# Patient Record
Sex: Male | Born: 1935 | Race: White | Hispanic: No | Marital: Married | Smoking: Former smoker
Health system: Southern US, Community
[De-identification: ages and names within clinical notes are randomized; demographics above are authoritative.]

## PROBLEM LIST (undated history)

## (undated) DIAGNOSIS — I2510 Atherosclerotic heart disease of native coronary artery without angina pectoris: Secondary | ICD-10-CM

## (undated) DIAGNOSIS — I4891 Unspecified atrial fibrillation: Secondary | ICD-10-CM

## (undated) DIAGNOSIS — I472 Ventricular tachycardia, unspecified: Secondary | ICD-10-CM

## (undated) DIAGNOSIS — I10 Essential (primary) hypertension: Secondary | ICD-10-CM

## (undated) DIAGNOSIS — I9789 Other postprocedural complications and disorders of the circulatory system, not elsewhere classified: Secondary | ICD-10-CM

## (undated) DIAGNOSIS — M7022 Olecranon bursitis, left elbow: Secondary | ICD-10-CM

## (undated) DIAGNOSIS — I739 Peripheral vascular disease, unspecified: Secondary | ICD-10-CM

## (undated) DIAGNOSIS — D126 Benign neoplasm of colon, unspecified: Secondary | ICD-10-CM

## (undated) DIAGNOSIS — M1990 Unspecified osteoarthritis, unspecified site: Secondary | ICD-10-CM

## (undated) DIAGNOSIS — R0602 Shortness of breath: Secondary | ICD-10-CM

## (undated) DIAGNOSIS — K219 Gastro-esophageal reflux disease without esophagitis: Secondary | ICD-10-CM

## (undated) DIAGNOSIS — M65342 Trigger finger, left ring finger: Secondary | ICD-10-CM

## (undated) DIAGNOSIS — R001 Bradycardia, unspecified: Secondary | ICD-10-CM

## (undated) DIAGNOSIS — Z952 Presence of prosthetic heart valve: Secondary | ICD-10-CM

## (undated) DIAGNOSIS — K5790 Diverticulosis of intestine, part unspecified, without perforation or abscess without bleeding: Secondary | ICD-10-CM

## (undated) DIAGNOSIS — Z87891 Personal history of nicotine dependence: Secondary | ICD-10-CM

## (undated) DIAGNOSIS — I350 Nonrheumatic aortic (valve) stenosis: Secondary | ICD-10-CM

## (undated) DIAGNOSIS — Z9861 Coronary angioplasty status: Secondary | ICD-10-CM

## (undated) DIAGNOSIS — E785 Hyperlipidemia, unspecified: Secondary | ICD-10-CM

## (undated) HISTORY — DX: Personal history of nicotine dependence: Z87.891

## (undated) HISTORY — PX: TONSILLECTOMY: SUR1361

## (undated) HISTORY — DX: Hyperlipidemia, unspecified: E78.5

## (undated) HISTORY — DX: Ventricular tachycardia: I47.2

## (undated) HISTORY — DX: Ventricular tachycardia, unspecified: I47.20

## (undated) HISTORY — DX: Benign neoplasm of colon, unspecified: D12.6

## (undated) HISTORY — PX: ROTATOR CUFF REPAIR: SHX139

## (undated) HISTORY — DX: Essential (primary) hypertension: I10

## (undated) HISTORY — DX: Unspecified atrial fibrillation: I97.89

## (undated) HISTORY — DX: Diverticulosis of intestine, part unspecified, without perforation or abscess without bleeding: K57.90

## (undated) HISTORY — DX: Unspecified atrial fibrillation: I48.91

## (undated) HISTORY — PX: OTHER SURGICAL HISTORY: SHX169

## (undated) HISTORY — DX: Gastro-esophageal reflux disease without esophagitis: K21.9

---

## 1991-06-12 DIAGNOSIS — I2510 Atherosclerotic heart disease of native coronary artery without angina pectoris: Secondary | ICD-10-CM

## 1991-06-12 DIAGNOSIS — Z9861 Coronary angioplasty status: Secondary | ICD-10-CM

## 1991-06-12 HISTORY — DX: Atherosclerotic heart disease of native coronary artery without angina pectoris: I25.10

## 1991-06-12 HISTORY — PX: CORONARY ANGIOPLASTY: SHX604

## 1991-06-12 HISTORY — DX: Atherosclerotic heart disease of native coronary artery without angina pectoris: Z98.61

## 1994-06-11 HISTORY — PX: OTHER SURGICAL HISTORY: SHX169

## 1994-06-11 HISTORY — PX: ILIAC ARTERY STENT: SHX1786

## 1994-07-19 HISTORY — PX: OTHER SURGICAL HISTORY: SHX169

## 1995-01-17 HISTORY — PX: OTHER SURGICAL HISTORY: SHX169

## 2009-07-11 ENCOUNTER — Encounter (INDEPENDENT_AMBULATORY_CARE_PROVIDER_SITE_OTHER): Payer: Self-pay

## 2009-07-12 ENCOUNTER — Ambulatory Visit: Payer: Self-pay | Admitting: Gastroenterology

## 2009-07-27 ENCOUNTER — Ambulatory Visit: Payer: Self-pay | Admitting: Gastroenterology

## 2009-08-01 ENCOUNTER — Encounter: Payer: Self-pay | Admitting: Gastroenterology

## 2009-10-20 ENCOUNTER — Encounter: Payer: Self-pay | Admitting: Internal Medicine

## 2009-11-10 ENCOUNTER — Inpatient Hospital Stay (HOSPITAL_COMMUNITY): Admission: EM | Admit: 2009-11-10 | Discharge: 2009-11-12 | Payer: Self-pay | Admitting: Emergency Medicine

## 2009-11-10 HISTORY — PX: CARDIAC CATHETERIZATION: SHX172

## 2009-11-11 ENCOUNTER — Encounter (INDEPENDENT_AMBULATORY_CARE_PROVIDER_SITE_OTHER): Payer: Self-pay | Admitting: Cardiology

## 2009-11-14 ENCOUNTER — Ambulatory Visit (HOSPITAL_COMMUNITY): Admission: RE | Admit: 2009-11-14 | Discharge: 2009-11-14 | Payer: Self-pay | Admitting: Cardiovascular Disease

## 2010-07-13 NOTE — Procedures (Signed)
Summary: Colonoscopy  Patient: Ralph Drake Note: All result statuses are Final unless otherwise noted.  Tests: (1) Colonoscopy (COL)   COL Colonoscopy           DONE     [facility]     [address]     [place], [place]  [address]           COLONOSCOPY PROCEDURE REPORT           PATIENT:  Ralph Drake, Ralph Drake  MR#:  [id number]     BIRTHDATE:  11/14/1934, 74 yrs. old  GENDER:  male           ENDOSCOPIST:  Vania Rea. Jarold Motto, MD, [facility]     Referred by:  Tracey Harries, M.D.           PROCEDURE DATE:  07/27/2009     PROCEDURE:  Colonoscopy with snare polypectomy     ASA CLASS:  Class II     INDICATIONS:  Routine Risk Screening           MEDICATIONS:   Fentanyl 25 mcg IV, Versed 5 mg IV           DESCRIPTION OF PROCEDURE:   After the risks benefits and     alternatives of the procedure were thoroughly explained, informed     consent was obtained.  Digital rectal exam was performed and     revealed no abnormalities.   The LB CF-[id number] endoscope     was introduced through the anus and advanced to the cecum, which     was identified by both the appendix and ileocecal valve, without     limitations.  The quality of the prep was excellent, using     MoviPrep.  The instrument was then slowly withdrawn as the colon     was fully examined.     <<PROCEDUREIMAGES>>           FINDINGS:  A diminutive polyp was found at the hepatic flexure.     Polyp was snared without cautery. Retrieval was successful. snare     polyp  A sessile polyp was found in the descending colon. 1cm     vascular polyp removed Polyp was snared, then cauterized with     monopolar cautery. Retrieval was successful. snare polyp  This was     otherwise a normal examination of the colon.   Retroflexed views     in the rectum revealed no abnormalities.    The scope was then     withdrawn from the patient and the procedure completed.           COMPLICATIONS:  None           ENDOSCOPIC IMPRESSION:     1)  Diminutive polyp at the hepatic flexure     2) Sessile polyp in the descending colon     3) Otherwise normal examination     RECOMMENDATIONS:     1) Given your significant family history of colon cancer, you     should have a repeat colonoscopy in 5 years           REPEAT EXAM:  No           ______________________________     Vania Rea. Jarold Motto, MD, [facility]           CC:           n.     eSIGNED:   Vania Rea. Cordarro Spinnato at 07/27/2009 10:18  AM           [place], [place], [phone]  Note: An exclamation mark (!) indicates a result that was not dispersed into the flowsheet. Document Creation Date: 07/27/2009 10:19 AM _______________________________________________________________________  (1) Order result status: Final Collection or observation date-time: 07/27/2009 10:08 Requested date-time:  Receipt date-time:  Reported date-time:  Referring Physician:   Ordering Physician: Sheryn Bison [id number]) Specimen Source:  Source: Launa Grill Order Number: [id number] Lab site:   Appended Document: Colonoscopy     Procedures Next Due Date:    Colonoscopy: 08/2014

## 2010-07-13 NOTE — Letter (Signed)
Summary: [facility] Instructions  [facility]  [address] [place], [place] [address]   Phone: [phone]  Fax: [phone]       Ralph Drake    08/29/1934    MRN: [id number]        Procedure Day Dorna Bloom:  Wednesday 07/27/2009     Arrival Time: 8:30 am      Procedure Time: 9:30 am     Location of Procedure:                    _x _  [facility] (4th Floor)   PREPARATION FOR COLONOSCOPY WITH MOVIPREP   Starting 5 days prior to your procedure Friday 2/11 do not eat nuts, seeds, popcorn, corn, beans, peas,  salads, or any raw vegetables.  Do not take any fiber supplements (e.g. Metamucil, Citrucel, and Benefiber).  THE DAY BEFORE YOUR PROCEDURE         DATE: Tuesday 2/15  1.  Drink clear liquids the entire day-NO SOLID FOOD  2.  Do not drink anything colored red or purple.  Avoid juices with pulp.  No orange juice.  3.  Drink at least 64 oz. (8 glasses) of fluid/clear liquids during the day to prevent dehydration and help the prep work efficiently.  CLEAR LIQUIDS INCLUDE: Water Jello Ice Popsicles Tea (sugar ok, no milk/cream) Powdered fruit flavored drinks Coffee (sugar ok, no milk/cream) Gatorade Juice: apple, white grape, white cranberry  Lemonade Clear bullion, consomm, broth Carbonated beverages (any kind) Strained chicken noodle soup Hard Candy                             4.  In the morning, mix first dose of MoviPrep solution:    Empty 1 Pouch A and 1 Pouch B into the disposable container    Add lukewarm drinking water to the top line of the container. Mix to dissolve    Refrigerate (mixed solution should be used within 24 hrs)  5.  Begin drinking the prep at 5:00 p.m. The MoviPrep container is divided by 4 marks.   Every 15 minutes drink the solution down to the next mark (approximately 8 oz) until the full liter is complete.   6.  Follow completed prep with 16 oz of clear liquid of your choice (Nothing red or purple).   Continue to drink clear liquids until bedtime.  7.  Before going to bed, mix second dose of MoviPrep solution:    Empty 1 Pouch A and 1 Pouch B into the disposable container    Add lukewarm drinking water to the top line of the container. Mix to dissolve    Refrigerate  THE DAY OF YOUR PROCEDURE      DATE: Wednesday 2/16  Beginning at 4:30 am (5 hours before procedure):         1. Every 15 minutes, drink the solution down to the next mark (approx 8 oz) until the full liter is complete.  2. Follow completed prep with 16 oz. of clear liquid of your choice.    3. You may drink clear liquids until 7:30 am (2 HOURS BEFORE PROCEDURE).   MEDICATION INSTRUCTIONS  Unless otherwise instructed, you should take regular prescription medications with a small sip of water   as early as possible the morning of your procedure         OTHER INSTRUCTIONS  You will need a responsible adult at least  74 years of age to accompany you and drive you home.   This person must remain in the waiting room during your procedure.  Wear loose fitting clothing that is easily removed.  Leave jewelry and other valuables at home.  However, you may wish to bring a book to read or  an iPod/MP3 player to listen to music as you wait for your procedure to start.  Remove all body piercing jewelry and leave at home.  Total time from sign-in until discharge is approximately 2-3 hours.  You should go home directly after your procedure and rest.  You can resume normal activities the  day after your procedure.  The day of your procedure you should not:   Drive   Make legal decisions   Operate machinery   Drink alcohol   Return to work  You will receive specific instructions about eating, activities and medications before you leave.    The above instructions have been reviewed and explained to me by   Ulis Rias RN  July 12, 2009 1:38 PM     I fully understand and can verbalize these  instructions _____________________________ Date _________

## 2010-07-13 NOTE — Letter (Signed)
Summary: Patient Notice- Polyp Results  [facility]  [address] [place], [place] [address]   Phone: [phone]  Fax: [phone]        August 01, 2009 MRN: [id number]    Ralph Drake [address] [place], [place]  [address]    Dear Ralph Drake,  I am pleased to inform you that the colon polyp(s) removed during your recent colonoscopy was (were) found to be benign (no cancer detected) upon pathologic examination.  I recommend you have a repeat colonoscopy examination in 5_ years to look for recurrent polyps, as having colon polyps increases your risk for having recurrent polyps or even colon cancer in the future.  Should you develop new or worsening symptoms of abdominal pain, bowel habit changes or bleeding from the rectum or bowels, please schedule an evaluation with either your primary care physician or with me.  Additional information/recommendations:  _x_ No further action with gastroenterology is needed at this time. Please      follow-up with your primary care physician for your other healthcare      needs.  __ Please call [phone] to schedule a return visit to review your      situation.  __ Please keep your follow-up visit as already scheduled.  __ Continue treatment plan as outlined the day of your exam.  Please call us if you are having persistent problems or have questions about your condition that have not been fully answered at this time.  Sincerely,  Mardella Layman MD [facility]  This letter has been electronically signed by your physician.  Appended Document: Patient Notice- Polyp Results Letter mailed 2.22.11.

## 2010-07-13 NOTE — Miscellaneous (Signed)
Summary: Lec previsit  Clinical Lists Changes  Medications: Added new medication of MOVIPREP 100 GM  SOLR (PEG-KCL-NACL-NASULF-NA ASC-C) As per prep instructions. - Signed Rx of MOVIPREP 100 GM  SOLR (PEG-KCL-NACL-NASULF-NA ASC-C) As per prep instructions.;  #1 x 0;  Signed;  Entered by: Ulis Rias RN;  Authorized by: Mardella Layman MD [facility];  Method used: Electronically to [facility] [address], [place], [place], [place]  [address], Ph: [phone], Fax: [phone] Observations: Added new observation of ALLERGY REV: Done (07/12/2009 13:13)    Prescriptions: MOVIPREP 100 GM  SOLR (PEG-KCL-NACL-NASULF-NA ASC-C) As per prep instructions.  #1 x 0   Entered by:   Ulis Rias RN   Authorized by:   Mardella Layman MD [facility]   Signed by:   Ulis Rias RN on 07/12/2009   Method used:   Electronically to        [facility] (retail)       [address]       [place], [place]  [address]       Ph: [phone]       Fax: [phone]   RxID:   [id number]

## 2010-08-28 LAB — CBC
HCT: 41.3 % (ref 39.0–52.0)
Hemoglobin: 14.2 g/dL (ref 13.0–17.0)
Platelets: 197 10*3/uL (ref 150–400)
RBC: 4.23 MIL/uL (ref 4.22–5.81)

## 2010-08-28 LAB — DIFFERENTIAL
Basophils Absolute: 0 10*3/uL (ref 0.0–0.1)
Eosinophils Absolute: 0.1 10*3/uL (ref 0.0–0.7)
Eosinophils Relative: 1 % (ref 0–5)
Lymphocytes Relative: 23 % (ref 12–46)
Lymphs Abs: 1.8 10*3/uL (ref 0.7–4.0)
Monocytes Relative: 5 % (ref 3–12)
Neutro Abs: 5.6 10*3/uL (ref 1.7–7.7)

## 2010-08-28 LAB — CARDIAC PANEL(CRET KIN+CKTOT+MB+TROPI)
CK, MB: 2.3 ng/mL (ref 0.3–4.0)
CK, MB: 2.3 ng/mL (ref 0.3–4.0)
CK, MB: 2.7 ng/mL (ref 0.3–4.0)
CK, MB: 3.3 ng/mL (ref 0.3–4.0)
Relative Index: INVALID (ref 0.0–2.5)
Total CK: 92 U/L (ref 7–232)
Troponin I: 0.12 ng/mL — ABNORMAL HIGH (ref 0.00–0.06)
Troponin I: 0.13 ng/mL — ABNORMAL HIGH (ref 0.00–0.06)
Troponin I: 0.14 ng/mL — ABNORMAL HIGH (ref 0.00–0.06)

## 2010-08-28 LAB — HEMOGLOBIN A1C
Hgb A1c MFr Bld: 6.1 % — ABNORMAL HIGH (ref <5.7)
Mean Plasma Glucose: 128 mg/dL — ABNORMAL HIGH (ref <117)

## 2010-08-28 LAB — COMPREHENSIVE METABOLIC PANEL
ALT: 21 U/L (ref 0–53)
AST: 28 U/L (ref 0–37)
Alkaline Phosphatase: 66 U/L (ref 39–117)
CO2: 25 mEq/L (ref 19–32)
Calcium: 9.4 mg/dL (ref 8.4–10.5)
Creatinine, Ser: 1.08 mg/dL (ref 0.4–1.5)
GFR calc non Af Amer: >60 mL/min (ref >60)
Glucose, Bld: 126 mg/dL — ABNORMAL HIGH (ref 70–99)
Potassium: 4.2 mEq/L (ref 3.5–5.1)
Total Protein: 6.2 g/dL (ref 6.0–8.3)

## 2010-08-28 LAB — POCT I-STAT 3, ART BLOOD GAS (G3+)
Bicarbonate: 27.2 mEq/L — ABNORMAL HIGH (ref 20.0–24.0)
TCO2: 29 mmol/L (ref 0–100)
pH, Arterial: 7.329 — ABNORMAL LOW (ref 7.350–7.450)
pO2, Arterial: 64 mmHg — ABNORMAL LOW (ref 80.0–100.0)

## 2010-08-28 LAB — POCT I-STAT 3, VENOUS BLOOD GAS (G3P V)
Bicarbonate: 27.1 mEq/L — ABNORMAL HIGH (ref 20.0–24.0)
TCO2: 29 mmol/L (ref 0–100)

## 2010-08-28 LAB — URINALYSIS, ROUTINE W REFLEX MICROSCOPIC
Glucose, UA: NEGATIVE mg/dL
Hgb urine dipstick: NEGATIVE
Nitrite: NEGATIVE

## 2010-08-28 LAB — POCT I-STAT, CHEM 8
BUN: 17 mg/dL (ref 6–23)
Calcium, Ion: 1.23 mmol/L (ref 1.12–1.32)
Chloride: 105 mEq/L (ref 96–112)
Glucose, Bld: 123 mg/dL — ABNORMAL HIGH (ref 70–99)

## 2010-08-28 LAB — BASIC METABOLIC PANEL
BUN: 18 mg/dL (ref 6–23)
Creatinine, Ser: 1.14 mg/dL (ref 0.4–1.5)
GFR calc Af Amer: >60 mL/min (ref >60)
GFR calc non Af Amer: >60 mL/min (ref >60)
Potassium: 4.2 mEq/L (ref 3.5–5.1)

## 2010-08-28 LAB — TROPONIN I
Troponin I: 0.03 ng/mL (ref 0.00–0.06)
Troponin I: 0.04 ng/mL (ref 0.00–0.06)

## 2010-08-28 LAB — MRSA PCR SCREENING: MRSA by PCR: NEGATIVE

## 2010-08-28 LAB — PROTIME-INR: Prothrombin Time: 12 seconds (ref 11.6–15.2)

## 2011-07-03 ENCOUNTER — Encounter: Payer: Self-pay | Admitting: Physician Assistant

## 2011-07-03 ENCOUNTER — Other Ambulatory Visit: Payer: Self-pay | Admitting: Physician Assistant

## 2011-07-03 NOTE — H&P (Signed)
Ralph Drake is an 76 y.o. male.   Chief Complaint: left elbow bursitis and left 4th finger trigger HPI: Ralph Drake is a 76 year old seen for evaluation of recurrent left elbow olecranon bursitis. We last saw him and aspirated this on 11/13/10 but it slowly came back in 2-3 weeks and is now bothering him persistently. He also has painful left 4th trigger finger. He's had intermittent trigger fingers in the 3rd and 5th but the 4th trigger finger is the one that bothers him the most.  Past Medical History  Diagnosis Date  . Hypertension   . GERD (gastroesophageal reflux disease)   . Coronary artery disease     Past Surgical History  Procedure Date  . Coronary angioplasty     Family History  Problem Relation Age of Onset  . Hypertension Other    Social History:  reports that he has been smoking.  He does not have any smokeless tobacco history on file. He reports that he does not drink alcohol. His drug history not on file.  Allergies: No Known Allergies  Medications: Current outpatient prescriptions:amLODipine (NORVASC) 10 MG tablet, Take 10 mg by mouth daily., Disp: , Rfl: ;  aspirin 325 MG EC tablet, Take 325 mg by mouth daily., Disp: , Rfl: ;  atorvastatin (LIPITOR) 40 MG tablet, Take 40 mg by mouth daily., Disp: , Rfl: ;  cilostazol (PLETAL) 50 MG tablet, Take 50 mg by mouth 2 (two) times daily., Disp: , Rfl: ;  ezetimibe (ZETIA) 10 MG tablet, Take 10 mg by mouth daily., Disp: , Rfl:  fish oil-omega-3 fatty acids 1000 MG capsule, Take 1 g by mouth daily., Disp: , Rfl: ;  lisinopril (PRINIVIL,ZESTRIL) 40 MG tablet, Take 40 mg by mouth daily., Disp: , Rfl: ;  multivitamin (THERAGRAN) per tablet, Take 1 tablet by mouth daily., Disp: , Rfl: ;  nebivolol (BYSTOLIC) 2.5 MG tablet, Take 2.5 mg by mouth every other day. 1/2 a tablet every other day, Disp: , Rfl:  omeprazole (PRILOSEC) 20 MG capsule, Take 20 mg by mouth daily., Disp: , Rfl: ;  vitamin E 1000 UNIT capsule, Take 1,000 Units by  mouth daily., Disp: , Rfl:   No current outpatient prescriptions on file as of 07/03/2011.   No current facility-administered medications on file as of 07/03/2011.    No results found for this or any previous visit (from the past 48 hour(s)). No results found.  Review of Systems  Constitutional: Negative.   HENT: Negative.   Eyes: Negative.   Respiratory: Negative.   Cardiovascular: Negative.  Negative for chest pain.  Gastrointestinal: Negative.   Genitourinary: Negative.   Musculoskeletal:       Left elbow swelling.  Left 4 th finger triggering  Skin: Negative.   Neurological: Negative.   Endo/Heme/Allergies: Negative.   Psychiatric/Behavioral: Negative.     There were no vitals taken for this visit. Physical Exam  Constitutional: He is oriented to person, place, and time. He appears well-developed and well-nourished.  HENT:  Head: Normocephalic and atraumatic.  Mouth/Throat: Oropharynx is clear and moist.  Eyes: Conjunctivae and EOM are normal. Pupils are equal, round, and reactive to light.  Neck: Neck supple.  Cardiovascular: Normal rate.   Respiratory: Effort normal.  GI: Soft.  Genitourinary:       Not pertinent to current symptomatology therefore not examined.  Musculoskeletal:       Examination of his left elbow reveals prominence to the olecranon bursa with no wounds, no streaking no   cellulitis full range of motion. Exam of the right elbow reveals no pain swelling or deformity. Exam of the left 4th finger reveals positive triggering with pain no triggering of the other fingers.  Neurological: He is alert and oriented to person, place, and time.  Skin: Skin is warm and dry.  Psychiatric: He has a normal mood and affect. His behavior is normal. Judgment normal.     Assessment Left elbow olecranon bursitis Left 4th finger trigger Coronary artery disease GERD Hypertension  Plan I talk to him in detail about this. I think with recurrent pain and swelling we  should proceed with left elbow olecranon bursectomy as well as left 4th trigger finger release. Discussed risks benefits and possible complications of the surgery in detail and he understands this completely.    Ralph Drake 07/03/2011, 1:49 PM    

## 2011-07-18 NOTE — Progress Notes (Signed)
Dr Gelene Mink wanted dr Alanda Amass aware he was having surgery 07/23/11-was he ok with him having out pt surg here and going home same day-pt has severe aortic stenosis and pvd. Dr Mancel Parsons office called 2/4-pt has ov to see him 07/19/11

## 2011-07-19 ENCOUNTER — Encounter (HOSPITAL_BASED_OUTPATIENT_CLINIC_OR_DEPARTMENT_OTHER): Payer: Self-pay | Admitting: *Deleted

## 2011-07-19 ENCOUNTER — Other Ambulatory Visit: Payer: Self-pay | Admitting: Physician Assistant

## 2011-07-19 ENCOUNTER — Encounter (HOSPITAL_BASED_OUTPATIENT_CLINIC_OR_DEPARTMENT_OTHER)
Admission: RE | Admit: 2011-07-19 | Discharge: 2011-07-19 | Disposition: A | Discharge status: Home / Self Care | Payer: Medicare Other | Source: Ambulatory Visit | Attending: Orthopedic Surgery | Admitting: Orthopedic Surgery

## 2011-07-19 LAB — BASIC METABOLIC PANEL
Calcium: 10.7 mg/dL — ABNORMAL HIGH (ref 8.4–10.5)
GFR calc non Af Amer: 67 mL/min — ABNORMAL LOW (ref >90)
Potassium: 4.6 mEq/L (ref 3.5–5.1)
Sodium: 139 mEq/L (ref 135–145)

## 2011-07-19 NOTE — Progress Notes (Signed)
Bring all medications. Pt plans to come today for BMET. Spoke with JC Dr..Weintraub's nurse that Alanda Amass approved for surgery as long and it is light sedation, no heavy sedation ---- such local or versed and close cardiac monitoring

## 2011-07-23 ENCOUNTER — Encounter (HOSPITAL_BASED_OUTPATIENT_CLINIC_OR_DEPARTMENT_OTHER): Payer: Self-pay | Admitting: Physician Assistant

## 2011-07-23 ENCOUNTER — Encounter (HOSPITAL_BASED_OUTPATIENT_CLINIC_OR_DEPARTMENT_OTHER): Payer: Self-pay | Admitting: Certified Registered Nurse Anesthetist

## 2011-07-23 ENCOUNTER — Ambulatory Visit (HOSPITAL_BASED_OUTPATIENT_CLINIC_OR_DEPARTMENT_OTHER)
Admission: RE | Admit: 2011-07-23 | Discharge: 2011-07-23 | Disposition: A | Discharge status: Home / Self Care | Payer: Medicare Other | Source: Ambulatory Visit | Attending: Orthopedic Surgery | Admitting: Orthopedic Surgery

## 2011-07-23 ENCOUNTER — Encounter (HOSPITAL_BASED_OUTPATIENT_CLINIC_OR_DEPARTMENT_OTHER): Payer: Self-pay | Admitting: *Deleted

## 2011-07-23 ENCOUNTER — Ambulatory Visit (HOSPITAL_BASED_OUTPATIENT_CLINIC_OR_DEPARTMENT_OTHER): Payer: Medicare Other | Admitting: Certified Registered Nurse Anesthetist

## 2011-07-23 ENCOUNTER — Encounter (HOSPITAL_BASED_OUTPATIENT_CLINIC_OR_DEPARTMENT_OTHER)
Admission: RE | Disposition: A | Discharge status: Home / Self Care | Payer: Self-pay | Source: Ambulatory Visit | Attending: Orthopedic Surgery

## 2011-07-23 DIAGNOSIS — I10 Essential (primary) hypertension: Secondary | ICD-10-CM | POA: Insufficient documentation

## 2011-07-23 DIAGNOSIS — I359 Nonrheumatic aortic valve disorder, unspecified: Secondary | ICD-10-CM | POA: Insufficient documentation

## 2011-07-23 DIAGNOSIS — M7022 Olecranon bursitis, left elbow: Secondary | ICD-10-CM | POA: Insufficient documentation

## 2011-07-23 DIAGNOSIS — K219 Gastro-esophageal reflux disease without esophagitis: Secondary | ICD-10-CM | POA: Insufficient documentation

## 2011-07-23 DIAGNOSIS — M65342 Trigger finger, left ring finger: Secondary | ICD-10-CM | POA: Insufficient documentation

## 2011-07-23 DIAGNOSIS — M6530 Trigger finger, unspecified finger: Secondary | ICD-10-CM | POA: Insufficient documentation

## 2011-07-23 DIAGNOSIS — I2510 Atherosclerotic heart disease of native coronary artery without angina pectoris: Secondary | ICD-10-CM | POA: Insufficient documentation

## 2011-07-23 DIAGNOSIS — M1990 Unspecified osteoarthritis, unspecified site: Secondary | ICD-10-CM | POA: Insufficient documentation

## 2011-07-23 DIAGNOSIS — M7020 Olecranon bursitis, unspecified elbow: Secondary | ICD-10-CM | POA: Insufficient documentation

## 2011-07-23 HISTORY — PX: TRIGGER FINGER RELEASE: SHX641

## 2011-07-23 HISTORY — DX: Peripheral vascular disease, unspecified: I73.9

## 2011-07-23 HISTORY — DX: Trigger finger, left ring finger: M65.342

## 2011-07-23 HISTORY — PX: OLECRANON BURSECTOMY: SHX2097

## 2011-07-23 HISTORY — DX: Olecranon bursitis, left elbow: M70.22

## 2011-07-23 SURGERY — RELEASE, A1 PULLEY, FOR TRIGGER FINGER
Anesthesia: Monitor Anesthesia Care | Site: Finger | Laterality: Left | Wound class: Clean

## 2011-07-23 MED ORDER — FENTANYL CITRATE 0.05 MG/ML IJ SOLN
50.0000 ug | INTRAMUSCULAR | Status: DC | PRN
  Administered 2011-07-23: 50 ug via INTRAVENOUS

## 2011-07-23 MED ORDER — HYDROCODONE-ACETAMINOPHEN 5-325 MG PO TABS: ORAL_TABLET | ORAL | Status: DC

## 2011-07-23 MED ORDER — PROPOFOL 10 MG/ML IV EMUL
INTRAVENOUS | Status: DC | PRN
  Administered 2011-07-23: 75 ug/kg/min via INTRAVENOUS

## 2011-07-23 MED ORDER — MORPHINE SULFATE 4 MG/ML IJ SOLN: 0.0500 mg/kg | INTRAMUSCULAR | Status: DC | PRN

## 2011-07-23 MED ORDER — DEXAMETHASONE SODIUM PHOSPHATE 4 MG/ML IJ SOLN
INTRAMUSCULAR | Status: DC | PRN
  Administered 2011-07-23: 4 mg via INTRAVENOUS

## 2011-07-23 MED ORDER — MIDAZOLAM HCL 2 MG/2ML IJ SOLN
0.5000 mg | INTRAMUSCULAR | Status: DC | PRN
  Administered 2011-07-23: 2 mg via INTRAVENOUS

## 2011-07-23 MED ORDER — METOCLOPRAMIDE HCL 5 MG/ML IJ SOLN: 10.0000 mg | Freq: Once | INTRAMUSCULAR | Status: DC | PRN

## 2011-07-23 MED ORDER — ONDANSETRON HCL 4 MG/2ML IJ SOLN
INTRAMUSCULAR | Status: DC | PRN
  Administered 2011-07-23: 4 mg via INTRAVENOUS

## 2011-07-23 MED ORDER — CHLORHEXIDINE GLUCONATE 4 % EX LIQD: 60.0000 mL | Freq: Once | CUTANEOUS | Status: DC

## 2011-07-23 MED ORDER — LACTATED RINGERS IV SOLN
INTRAVENOUS | Status: DC
  Administered 2011-07-23: 08:00:00 via INTRAVENOUS

## 2011-07-23 MED ORDER — CEFAZOLIN SODIUM 1-5 GM-% IV SOLN
1.0000 g | INTRAVENOUS | Status: AC
  Administered 2011-07-23: 1 g via INTRAVENOUS

## 2011-07-23 MED ORDER — FENTANYL CITRATE 0.05 MG/ML IJ SOLN: 25.0000 ug | INTRAMUSCULAR | Status: DC | PRN

## 2011-07-23 MED ORDER — LIDOCAINE HCL 1 % IJ SOLN
INTRAMUSCULAR | Status: DC | PRN
  Administered 2011-07-23: 2 mL via INTRADERMAL

## 2011-07-23 MED ORDER — ROPIVACAINE HCL 5 MG/ML IJ SOLN
INTRAMUSCULAR | Status: DC | PRN
  Administered 2011-07-23: 30 mL via EPIDURAL

## 2011-07-23 MED ORDER — LIDOCAINE HCL (CARDIAC) 20 MG/ML IV SOLN
INTRAVENOUS | Status: DC | PRN
  Administered 2011-07-23: 60 mg via INTRAVENOUS

## 2011-07-23 SURGICAL SUPPLY — 73 items
BANDAGE ELASTIC 3 VELCRO ST LF (GAUZE/BANDAGES/DRESSINGS) ×3 IMPLANT
BANDAGE ELASTIC 4 VELCRO ST LF (GAUZE/BANDAGES/DRESSINGS) ×5 IMPLANT
BANDAGE ELASTIC 6 VELCRO ST LF (GAUZE/BANDAGES/DRESSINGS) ×3 IMPLANT
BLADE MINI RND TIP GREEN BEAV (BLADE) IMPLANT
BLADE SURG 15 STRL LF DISP TIS (BLADE) ×4 IMPLANT
BLADE SURG 15 STRL SS (BLADE) ×6
BNDG CMPR 9X4 STRL LF SNTH (GAUZE/BANDAGES/DRESSINGS) ×2
BNDG COHESIVE 4X5 TAN STRL (GAUZE/BANDAGES/DRESSINGS) ×3 IMPLANT
BNDG ESMARK 4X9 LF (GAUZE/BANDAGES/DRESSINGS) ×3 IMPLANT
CLOTH BEACON ORANGE TIMEOUT ST (SAFETY) ×3 IMPLANT
COVER MAYO STAND STRL (DRAPES) ×3 IMPLANT
COVER TABLE BACK 60X90 (DRAPES) ×3 IMPLANT
CUFF TOURNIQUET SINGLE 18IN (TOURNIQUET CUFF) ×3 IMPLANT
DRAPE EXTREMITY T 121X128X90 (DRAPE) ×3 IMPLANT
DRAPE SURG 17X23 STRL (DRAPES) ×2 IMPLANT
DRAPE U 20/CS (DRAPES) ×3 IMPLANT
DRAPE U-SHAPE 47X51 STRL (DRAPES) ×3 IMPLANT
DRSG EMULSION OIL 3X3 NADH (GAUZE/BANDAGES/DRESSINGS) ×3 IMPLANT
DRSG PAD ABDOMINAL 8X10 ST (GAUZE/BANDAGES/DRESSINGS) ×2 IMPLANT
DURAPREP 26ML APPLICATOR (WOUND CARE) ×3 IMPLANT
ELECT NEEDLE TIP 2.8 STRL (NEEDLE) IMPLANT
ELECT REM PT RETURN 9FT ADLT (ELECTROSURGICAL) ×3
ELECTRODE REM PT RTRN 9FT ADLT (ELECTROSURGICAL) ×2 IMPLANT
GAUZE XEROFORM 1X8 LF (GAUZE/BANDAGES/DRESSINGS) ×3 IMPLANT
GLOVE BIO SURGEON STRL SZ 6.5 (GLOVE) ×2 IMPLANT
GLOVE BIO SURGEON STRL SZ7 (GLOVE) ×3 IMPLANT
GLOVE BIOGEL PI IND STRL 7.0 (GLOVE) ×2 IMPLANT
GLOVE BIOGEL PI IND STRL 7.5 (GLOVE) ×2 IMPLANT
GLOVE BIOGEL PI INDICATOR 7.0 (GLOVE) ×1
GLOVE BIOGEL PI INDICATOR 7.5 (GLOVE) ×1
GLOVE SS BIOGEL STRL SZ 7.5 (GLOVE) ×2 IMPLANT
GLOVE SUPERSENSE BIOGEL SZ 7.5 (GLOVE) ×1
GOWN PREVENTION PLUS XLARGE (GOWN DISPOSABLE) ×4 IMPLANT
GOWN STRL REIN XL XLG (GOWN DISPOSABLE) ×3 IMPLANT
NDL ADDISON D1/2 CIR (NEEDLE) IMPLANT
NDL SUT 6 .5 CRC .975X.05 MAYO (NEEDLE) IMPLANT
NEEDLE ADDISON D1/2 CIR (NEEDLE) IMPLANT
NEEDLE HYPO 25X1 1.5 SAFETY (NEEDLE) IMPLANT
NEEDLE MAYO TAPER (NEEDLE)
NS IRRIG 1000ML POUR BTL (IV SOLUTION) ×3 IMPLANT
PACK BASIN DAY SURGERY FS (CUSTOM PROCEDURE TRAY) ×3 IMPLANT
PAD CAST 3X4 CTTN HI CHSV (CAST SUPPLIES) ×3 IMPLANT
PAD CAST 4YDX4 CTTN HI CHSV (CAST SUPPLIES) ×2 IMPLANT
PADDING CAST ABS 4INX4YD NS (CAST SUPPLIES) ×1
PADDING CAST ABS COTTON 4X4 ST (CAST SUPPLIES) ×2 IMPLANT
PADDING CAST COTTON 3X4 STRL (CAST SUPPLIES) ×6
PADDING CAST COTTON 4X4 STRL (CAST SUPPLIES) ×3
PASSER SUT SWANSON 36MM LOOP (INSTRUMENTS) IMPLANT
PENCIL BUTTON HOLSTER BLD 10FT (ELECTRODE) ×3 IMPLANT
SLEEVE SCD COMPRESS KNEE MED (MISCELLANEOUS) ×3 IMPLANT
SPLINT FAST PLASTER 5X30 (CAST SUPPLIES) ×15
SPLINT PLASTER CAST FAST 5X30 (CAST SUPPLIES) IMPLANT
SPLINT PLASTER CAST XFAST 4X15 (CAST SUPPLIES) IMPLANT
SPLINT PLASTER XTRA FAST SET 4 (CAST SUPPLIES)
SPONGE GAUZE 4X4 12PLY (GAUZE/BANDAGES/DRESSINGS) ×4 IMPLANT
STOCKINETTE 4X48 STRL (DRAPES) ×3 IMPLANT
STOCKINETTE IMPERVIOUS LG (DRAPES) ×3 IMPLANT
STRIP CLOSURE SKIN 1/2X4 (GAUZE/BANDAGES/DRESSINGS) ×3 IMPLANT
SUCTION FRAZIER TIP 10 FR DISP (SUCTIONS) ×3 IMPLANT
SUT ETHILON 4 0 PS 2 18 (SUTURE) ×3 IMPLANT
SUT ETHILON 5 0 P 3 18 (SUTURE)
SUT FIBERWIRE 2-0 18 17.9 3/8 (SUTURE)
SUT NYLON ETHILON 5-0 P-3 1X18 (SUTURE) IMPLANT
SUT PROLENE 3 0 PS 2 (SUTURE) IMPLANT
SUT VIC AB 0 CT1 27 (SUTURE)
SUT VIC AB 0 CT1 27XBRD ANBCTR (SUTURE) IMPLANT
SUTURE FIBERWR 2-0 18 17.9 3/8 (SUTURE) IMPLANT
SYR BULB 3OZ (MISCELLANEOUS) ×3 IMPLANT
SYR CONTROL 10ML LL (SYRINGE) ×1 IMPLANT
TOWEL OR 17X24 6PK STRL BLUE (TOWEL DISPOSABLE) ×7 IMPLANT
TUBE CONNECTING 20X1/4 (TUBING) ×1 IMPLANT
UNDERPAD 30X30 INCONTINENT (UNDERPADS AND DIAPERS) ×3 IMPLANT
WATER STERILE IRR 1000ML POUR (IV SOLUTION) ×3 IMPLANT

## 2011-07-23 NOTE — Brief Op Note (Signed)
07/23/2011  9:48 AM  PATIENT:  Ralph Drake  77 y.o. male  PRE-OPERATIVE DIAGNOSIS:  left elbow olecranon bursa, left ring finger trigger finger  POST-OPERATIVE DIAGNOSIS:  same a  preop  PROCEDURE:  Procedure(s): RELEASE TRIGGER FINGER/A-1 PULLEY left 4 th finger OLECRANON BURSECTOMY LEFT  SURGEON:  Surgeon(s): Nilda Simmer, MD  PHYSICIAN ASSISTANT: Julien Girt PA-C  ASSISTANTS: Julien Girt PA-C   ANESTHESIA:   regional  EBL:     BLOOD ADMINISTERED:none  DRAINS: none   LOCAL MEDICATIONS USED:  NONE  SPECIMEN:  No Specimen  DISPOSITION OF SPECIMEN:  N/A  COUNTS:  YES  TOURNIQUET:   Total Tourniquet Time Documented: Upper Arm (Left) - 30 minutes  DICTATION: .Note written in EPIC  PLAN OF CARE: Discharge to home after PACU  PATIENT DISPOSITION:  PACU - hemodynamically stable.   Delay start of Pharmacological VTE agent (>24hrs) due to surgical blood loss or risk of bleeding: not applicable

## 2011-07-23 NOTE — Anesthesia Postprocedure Evaluation (Signed)
Anesthesia Post Note  Patient: Ralph Drake  Procedure(s) Performed:  RELEASE TRIGGER FINGER/A-1 PULLEY - release trigger left ring finger patient had supraclavicular block in preop; OLECRANON BURSA - excision olecranon bursa left elbow patient had supraclaviular block in preop  Anesthesia type: MAC  Patient location: PACU  Post pain: Pain level controlled  Post assessment: Patient's Cardiovascular Status Stable  Last Vitals:  Filed Vitals:   07/23/11 1030  BP: 116/50  Pulse: 38  Temp:   Resp: 13    Post vital signs: Reviewed and stable  Level of consciousness: alert  Complications: No apparent anesthesia complications

## 2011-07-23 NOTE — H&P (View-Only) (Signed)
Ralph Drake is an 77 y.o. male.   Chief Complaint: left elbow bursitis and left 4th finger trigger HPI: Ralph Drake is a 77 year old seen for evaluation of recurrent left elbow olecranon bursitis. We last saw him and aspirated this on 11/13/10 but it slowly came back in 2-3 weeks and is now bothering him persistently. He also has painful left 4th trigger finger. He's had intermittent trigger fingers in the 3rd and 5th but the 4th trigger finger is the one that bothers him the most.  Past Medical History  Diagnosis Date  . Hypertension   . GERD (gastroesophageal reflux disease)   . Coronary artery disease     Past Surgical History  Procedure Date  . Coronary angioplasty     Family History  Problem Relation Age of Onset  . Hypertension Other    Social History:  reports that he has been smoking.  He does not have any smokeless tobacco history on file. He reports that he does not drink alcohol. His drug history not on file.  Allergies: No Known Allergies  Medications: Current outpatient prescriptions:amLODipine (NORVASC) 10 MG tablet, Take 10 mg by mouth daily., Disp: , Rfl: ;  aspirin 325 MG EC tablet, Take 325 mg by mouth daily., Disp: , Rfl: ;  atorvastatin (LIPITOR) 40 MG tablet, Take 40 mg by mouth daily., Disp: , Rfl: ;  cilostazol (PLETAL) 50 MG tablet, Take 50 mg by mouth 2 (two) times daily., Disp: , Rfl: ;  ezetimibe (ZETIA) 10 MG tablet, Take 10 mg by mouth daily., Disp: , Rfl:  fish oil-omega-3 fatty acids 1000 MG capsule, Take 1 g by mouth daily., Disp: , Rfl: ;  lisinopril (PRINIVIL,ZESTRIL) 40 MG tablet, Take 40 mg by mouth daily., Disp: , Rfl: ;  multivitamin (THERAGRAN) per tablet, Take 1 tablet by mouth daily., Disp: , Rfl: ;  nebivolol (BYSTOLIC) 2.5 MG tablet, Take 2.5 mg by mouth every other day. 1/2 a tablet every other day, Disp: , Rfl:  omeprazole (PRILOSEC) 20 MG capsule, Take 20 mg by mouth daily., Disp: , Rfl: ;  vitamin E 1000 UNIT capsule, Take 1,000 Units by  mouth daily., Disp: , Rfl:   No current outpatient prescriptions on file as of 07/03/2011.   No current facility-administered medications on file as of 07/03/2011.    No results found for this or any previous visit (from the past 48 hour(s)). No results found.  Review of Systems  Constitutional: Negative.   HENT: Negative.   Eyes: Negative.   Respiratory: Negative.   Cardiovascular: Negative.  Negative for chest pain.  Gastrointestinal: Negative.   Genitourinary: Negative.   Musculoskeletal:       Left elbow swelling.  Left 4 th finger triggering  Skin: Negative.   Neurological: Negative.   Endo/Heme/Allergies: Negative.   Psychiatric/Behavioral: Negative.     There were no vitals taken for this visit. Physical Exam  Constitutional: He is oriented to person, place, and time. He appears well-developed and well-nourished.  HENT:  Head: Normocephalic and atraumatic.  Mouth/Throat: Oropharynx is clear and moist.  Eyes: Conjunctivae and EOM are normal. Pupils are equal, round, and reactive to light.  Neck: Neck supple.  Cardiovascular: Normal rate.   Respiratory: Effort normal.  GI: Soft.  Genitourinary:       Not pertinent to current symptomatology therefore not examined.  Musculoskeletal:       Examination of his left elbow reveals prominence to the olecranon bursa with no wounds, no streaking no  cellulitis full range of motion. Exam of the right elbow reveals no pain swelling or deformity. Exam of the left 4th finger reveals positive triggering with pain no triggering of the other fingers.  Neurological: He is alert and oriented to person, place, and time.  Skin: Skin is warm and dry.  Psychiatric: He has a normal mood and affect. His behavior is normal. Judgment normal.     Assessment Left elbow olecranon bursitis Left 4th finger trigger Coronary artery disease GERD Hypertension  Plan I talk to him in detail about this. I think with recurrent pain and swelling we  should proceed with left elbow olecranon bursectomy as well as left 4th trigger finger release. Discussed risks benefits and possible complications of the surgery in detail and he understands this completely.    Catelyn Friel J 07/03/2011, 1:49 PM

## 2011-07-23 NOTE — Progress Notes (Signed)
Assisted Dr. Frederick with left, ultrasound guided, supraclavicular block. Side rails up, monitors on throughout procedure. See vital signs in flow sheet. Tolerated Procedure well. 

## 2011-07-23 NOTE — Transfer of Care (Signed)
Immediate Anesthesia Transfer of Care Note  Patient: Ralph Drake  Procedure(s) Performed:  RELEASE TRIGGER FINGER/A-1 PULLEY - release trigger left ring finger patient had supraclavicular block in preop; OLECRANON BURSA - excision olecranon bursa left elbow patient had supraclaviular block in preop  Patient Location: PACU  Anesthesia Type: MAC combined with regional for post-op pain  Level of Consciousness: awake, alert , oriented and patient cooperative  Airway & Oxygen Therapy: Patient Spontanous Breathing and Patient connected to face mask oxygen  Post-op Assessment: Report given to PACU RN and Post -op Vital signs reviewed and stable  Post vital signs: Reviewed and stable  Complications: No apparent anesthesia complications

## 2011-07-23 NOTE — Interval H&P Note (Signed)
History and Physical Interval Note:  07/23/2011 9:01 AM  Ralph Drake  has presented today for surgery, with the diagnosis of left elbow olecranon bursa, left ring finger trigger finger  The various methods of treatment have been discussed with the patient and family. After consideration of risks, benefits and other options for treatment, the patient has consented to  Procedure(s): RELEASE TRIGGER FINGER/A-1 PULLEY LEFT OLECRANON BURSA LEFT as a surgical intervention .  The patients' history has been reviewed, patient examined, no change in status, stable for surgery.  I have reviewed the patients' chart and labs.  Questions were answered to the patient's satisfaction.     Salvatore Marvel A

## 2011-07-23 NOTE — Anesthesia Preprocedure Evaluation (Signed)
Anesthesia Evaluation  Patient identified by MRN, date of birth, ID band Patient awake    Reviewed: Allergy & Precautions, H&P , NPO status , Patient's Chart, lab work & pertinent test results, reviewed documented beta blocker date and time   Airway Mallampati: II TM Distance: >3 FB Neck ROM: full    Dental   Pulmonary neg pulmonary ROS,          Cardiovascular hypertension, On Medications and On Home Beta Blockers + CAD + Valvular Problems/Murmurs AS     Neuro/Psych Negative Neurological ROS  Negative Psych ROS   GI/Hepatic negative GI ROS, Neg liver ROS, GERD-  Medicated and Controlled,  Endo/Other  Negative Endocrine ROS  Renal/GU negative Renal ROS  Genitourinary negative   Musculoskeletal   Abdominal   Peds  Hematology negative hematology ROS (+)   Anesthesia Other Findings See surgeon's H&P   Reproductive/Obstetrics negative OB ROS                           Anesthesia Physical Anesthesia Plan  ASA: III  Anesthesia Plan: MAC and Regional   Post-op Pain Management:    Induction: Intravenous  Airway Management Planned: Simple Face Mask  Additional Equipment:   Intra-op Plan:   Post-operative Plan: Extubation in OR  Informed Consent: I have reviewed the patients History and Physical, chart, labs and discussed the procedure including the risks, benefits and alternatives for the proposed anesthesia with the patient or authorized representative who has indicated his/her understanding and acceptance.     Plan Discussed with: CRNA and Surgeon  Anesthesia Plan Comments:         Anesthesia Quick Evaluation

## 2011-07-23 NOTE — Anesthesia Procedure Notes (Signed)
Anesthesia Regional Block:  Supraclavicular block  Pre-Anesthetic Checklist: ,, timeout performed, Correct Patient, Correct Site, Correct Laterality, Correct Procedure, Correct Position, site marked, Risks and benefits discussed,  Surgical consent,  Pre-op evaluation,  At surgeon's request and post-op pain management  Laterality: Left  Prep: chloraprep       Needles:   Needle Type: Other   (Arrow Echogenic)   Needle Length: 9cm  Needle Gauge: 21    Additional Needles:  Procedures: ultrasound guided Supraclavicular block Narrative:  Start time: 07/23/2011 8:15 AM End time: 07/23/2011 8:23 AM Injection made incrementally with aspirations every 5 mL.  Performed by: Personally  Anesthesiologist: C Jaleisa Brose  Additional Notes: Ultrasound guidance used to: id relevant anatomy, confirm needle position, local anesthetic spread, avoidance of vascular puncture. Picture saved. No complications. Block performed personally by Janetta Hora. Gelene Mink, MD    Supraclavicular block

## 2011-07-24 ENCOUNTER — Encounter (HOSPITAL_BASED_OUTPATIENT_CLINIC_OR_DEPARTMENT_OTHER): Payer: Self-pay | Admitting: Orthopedic Surgery

## 2011-07-24 NOTE — Op Note (Signed)
NAME:  Ralph Drake, Ralph Drake                    ACCOUNT NO.:  MEDICAL RECORD NO.:  [id number]  LOCATION:                                 FACILITY:  PHYSICIAN:  Donaciano Range A. Thurston Hole, M.D.      DATE OF BIRTH:  DATE OF PROCEDURE:  07/23/2011 DATE OF DISCHARGE:                              OPERATIVE REPORT   PREOPERATIVE DIAGNOSES: 1. Left elbow olecranon bursitis. 2. Left 4th trigger finger.  POSTOP DIAGNOSES: 1. Left elbow olecranon bursitis. 2. Left 4th trigger finger.  PROCEDURE: 1. Left elbow olecranon bursectomy. 2. Left 4th trigger finger release.  SURGEON:  Elana Alm. Thurston Hole, M.D.  ASSISTANT:  Kirstin Shepperson, PA-C.  ANESTHESIA:  IV regional and MAC.  OPERATIVE TIME:  45 minutes.  COMPLICATIONS:  None.  INDICATION FOR PROCEDURE:  Mr. Ponciano is a 77 year old gentleman who has had painful recurrent left elbow olecranon bursitis and painful recurrent left 4th trigger finger.  He is now here to undergo left elbow olecranon bursectomy and left 4th trigger finger release due to failed conservative care.  DESCRIPTION OF PROCEDURE:  Mr. Dines was brought to the operating room on July 23, 2011, after interscalene block had been placed in the holding room by Anesthesia.  He had been placed on operative table in supine position.  He received Ancef 1 g IV preoperatively for prophylaxis.  After being placed under MAC anesthesia, his left arm was prepped using sterile DuraPrep and draped using sterile technique.  Time- out procedure was called and the correct arm and left 4th finger identified.  The arm was exsanguinated and tourniquet elevated to 150 mm.  Initially the left 4th trigger finger was released through a 1-cm transverse incision based over the MP flexion crease.  The underlying subcutaneous tissues were incised along with skin incision.  The digital nerves carefully protected while the a 1 pulley was exposed and then incised longitudinally revealing the underlying  flexor tendons, which were intact, but somewhat thickened with tendinosis.  A satisfactory and complete release was carried out and the finger could be brought through a full range of motion with no triggering.  After this was done, the wound was irrigated and was closed with interrupted 4-0 nylon suture. At this point, the olecranon bursectomy was carried out through a 3-cm transverse incision and the underlying subcutaneous tissues were incised along with skin incision.  The bursal sac was full of bloody bursal fluid, non-infectious, and this was drained and then the bursal tissue was resected.  Found to be very thickened and gritty, but no other pathology was noted.  It was not sent for pathologic review due to the obvious benign nature of it.  The ulnar nerve carefully protected during the procedure as well.  The triceps tendon underneath this was normal. After this bursectomy was carried out, the wound was irrigated and closed with 2-0 Vicryl and 4-0 Prolene.  Sterile dressings were applied and a long-arm posterior splint, and then the tourniquet was released and the patient was taken to recovery room in stable condition.  Needle and sponge counts were correct x2 at the end of the case.  FOLLOWUP CARE:  Mr. Borgerding  to be followed as a an outpatient, on Percocet for pain, and a long-arm splint.  He will be seen back in the office in a week for wound check and followup.     Mahonri Seiden A. Thurston Hole, M.D.     RAW/MEDQ  D:  07/23/2011  T:  07/24/2011  Job:  [id number]

## 2012-06-06 ENCOUNTER — Other Ambulatory Visit (HOSPITAL_COMMUNITY): Payer: Self-pay | Admitting: Cardiovascular Disease

## 2012-06-06 DIAGNOSIS — I350 Nonrheumatic aortic (valve) stenosis: Secondary | ICD-10-CM

## 2012-06-06 DIAGNOSIS — I2510 Atherosclerotic heart disease of native coronary artery without angina pectoris: Secondary | ICD-10-CM

## 2012-06-24 ENCOUNTER — Ambulatory Visit (HOSPITAL_COMMUNITY)
Admission: RE | Admit: 2012-06-24 | Discharge: 2012-06-24 | Disposition: A | Discharge status: Home / Self Care | Payer: Medicare Other | Source: Ambulatory Visit | Attending: Cardiovascular Disease | Admitting: Cardiovascular Disease

## 2012-06-24 DIAGNOSIS — I2510 Atherosclerotic heart disease of native coronary artery without angina pectoris: Secondary | ICD-10-CM | POA: Insufficient documentation

## 2012-06-24 DIAGNOSIS — I350 Nonrheumatic aortic (valve) stenosis: Secondary | ICD-10-CM

## 2012-06-24 DIAGNOSIS — I4510 Unspecified right bundle-branch block: Secondary | ICD-10-CM | POA: Insufficient documentation

## 2012-06-24 DIAGNOSIS — R0602 Shortness of breath: Secondary | ICD-10-CM | POA: Insufficient documentation

## 2012-06-24 DIAGNOSIS — I10 Essential (primary) hypertension: Secondary | ICD-10-CM | POA: Insufficient documentation

## 2012-06-24 DIAGNOSIS — I739 Peripheral vascular disease, unspecified: Secondary | ICD-10-CM | POA: Insufficient documentation

## 2012-06-24 DIAGNOSIS — F17200 Nicotine dependence, unspecified, uncomplicated: Secondary | ICD-10-CM | POA: Insufficient documentation

## 2012-06-24 HISTORY — PX: CARDIOVASCULAR STRESS TEST: SHX262

## 2012-06-24 MED ORDER — TECHNETIUM TC 99M SESTAMIBI GENERIC - CARDIOLITE
11.0000 | Freq: Once | INTRAVENOUS | Status: AC | PRN
  Administered 2012-06-24: 11 via INTRAVENOUS

## 2012-06-24 MED ORDER — REGADENOSON 0.4 MG/5ML IV SOLN
0.4000 mg | Freq: Once | INTRAVENOUS | Status: AC
  Administered 2012-06-24: 0.4 mg via INTRAVENOUS

## 2012-06-24 MED ORDER — TECHNETIUM TC 99M SESTAMIBI GENERIC - CARDIOLITE
30.1000 | Freq: Once | INTRAVENOUS | Status: AC | PRN
  Administered 2012-06-24: 30.1 via INTRAVENOUS

## 2012-06-24 NOTE — Progress Notes (Signed)
2D Echo Performed 04/14/2013    Russel Morain, RCS  

## 2012-06-24 NOTE — Procedures (Addendum)
[facility] [address] [address] [address] [place] [address] [phone]  Cardiology Nuclear Med Study  Ralph Drake is a 78 y.o. male     MRN : [id number]     DOB: 1935/05/16  Procedure Date: 06/24/2012  Nuclear Med Background Indication for Stress Test:  Evaluation for Ischemia History:  RBBB, CAD, PVD Cardiac Risk Factors: Hypertension, Lipids, RBBB and Smoker  Symptoms:  SOB   Nuclear Pre-Procedure Caffeine/Decaff Intake:  7:00pm NPO After: 5:00am   IV Site: R Antecubital  IV 0.9% NS with Angio Cath:  22g  Chest Size (in):  40 IV Started by: Koren Shiver, CNMT  Height: 5\' 5"  (1.651 m)  Cup Size: n/a  BMI:  Body mass index is 22.80 kg/(m^2). Weight:  137 lb (62.143 kg)   Tech Comments:  n/a    Nuclear Med Study 1 or 2 day study: 1 day  Stress Test Type:  Lexiscan  Order Authorizing Provider:  Susa Griffins, MD   Resting Radionuclide: Technetium 44m Sestamibi  Resting Radionuclide Dose: 11.0 mCi   Stress Radionuclide:  Technetium 34m Sestamibi  Stress Radionuclide Dose: 30.1 mCi           Stress Protocol Rest HR: 42 Stress HR: 93  Rest BP: 146/87 Stress BP: 134/74  Exercise Time (min): n/a METS: n/a   Predicted Max HR: 145 bpm % Max HR: 66.21 bpm Rate Pressure Product: 46962   Dose of Adenosine (mg):  n/a Dose of Lexiscan: 0.4 mg  Dose of Atropine (mg): n/a Dose of Dobutamine: n/a mcg/kg/min (at max HR)  Stress Test Technologist: Esperanza Sheets, CCT Nuclear Technologist: Gonzella Lex, CNMT   Rest Procedure:  Myocardial perfusion imaging was performed at rest 45 minutes following the intravenous administration of Technetium 45m Sestamibi. Stress Procedure:  The patient received IV Lexiscan 0.4 mg over 15-seconds.  Technetium 53m Sestamibi injected at 30-seconds.  There were no significant changes with Lexiscan.  Quantitative spect images were obtained after a 45 minute delay.  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 Stress Images:  Normal homogeneous uptake in all areas of the myocardium. Rest Images:  Normal homogeneous uptake in all areas of the myocardium. Subtraction (SDS):  No evidence of ischemia.  Impression Exercise Capacity:  Lexiscan with no exercise. BP Response:  Normal blood pressure response. Clinical Symptoms:  No significant symptoms noted. ECG Impression:  No significant ST segment change suggestive of ischemia. Comparison with Prior Nuclear Study: The previously described apical lateral defect is not appreciated on this study  Overall Impression:  Normal stress nuclear study.  LV Wall Motion:  Diffuse LV hypokinesis with moderately depressed systolic function and EF 38%. The LV does not appear to be dilated.Frequent PACs are noted on the ECG tracings - consider a gating artifact and correlate with echocardiogram. Absent an artifactual abnormality, findings suggest nonischemic cardiomyopathy.   [name], MD  06/24/2012 12:33 PM

## 2012-07-29 ENCOUNTER — Other Ambulatory Visit (HOSPITAL_COMMUNITY): Payer: Self-pay | Admitting: Cardiovascular Disease

## 2012-07-29 DIAGNOSIS — R0989 Other specified symptoms and signs involving the circulatory and respiratory systems: Secondary | ICD-10-CM

## 2012-08-20 ENCOUNTER — Encounter (HOSPITAL_COMMUNITY): Payer: Medicare Other

## 2012-08-26 ENCOUNTER — Ambulatory Visit (HOSPITAL_COMMUNITY)
Admission: RE | Admit: 2012-08-26 | Discharge: 2012-08-26 | Disposition: A | Discharge status: Home / Self Care | Payer: Medicare Other | Source: Ambulatory Visit | Attending: Cardiovascular Disease | Admitting: Cardiovascular Disease

## 2012-08-26 DIAGNOSIS — R0989 Other specified symptoms and signs involving the circulatory and respiratory systems: Secondary | ICD-10-CM | POA: Insufficient documentation

## 2012-08-26 NOTE — Progress Notes (Signed)
Carotid artery duplex completed. Larene Pickett RVT

## 2012-10-11 ENCOUNTER — Encounter: Payer: Self-pay | Admitting: Cardiovascular Disease

## 2012-11-25 ENCOUNTER — Encounter (HOSPITAL_COMMUNITY): Payer: Self-pay | Admitting: Cardiovascular Disease

## 2012-11-28 ENCOUNTER — Other Ambulatory Visit: Payer: Self-pay | Admitting: Cardiovascular Disease

## 2012-11-28 ENCOUNTER — Encounter: Payer: Self-pay | Admitting: Cardiovascular Disease

## 2012-11-28 LAB — CBC WITH DIFFERENTIAL/PLATELET
Basophils Absolute: 0 10*3/uL (ref 0.0–0.1)
Eosinophils Absolute: 0.1 10*3/uL (ref 0.0–0.7)
Eosinophils Relative: 2 % (ref 0–5)
Lymphocytes Relative: 32 % (ref 12–46)
MCV: 88.5 fL (ref 78.0–100.0)
Neutrophils Relative %: 60 % (ref 43–77)
Platelets: 212 10*3/uL (ref 150–400)
RBC: 4.59 MIL/uL (ref 4.22–5.81)
RDW: 15.1 % (ref 11.5–15.5)
WBC: 7.5 10*3/uL (ref 4.0–10.5)

## 2012-11-28 LAB — LIPID PANEL
LDL Cholesterol: 92 mg/dL (ref 0–99)
Total CHOL/HDL Ratio: 2.9 Ratio
VLDL: 11 mg/dL (ref 0–40)

## 2012-11-28 LAB — COMPREHENSIVE METABOLIC PANEL
ALT: 16 U/L (ref 0–53)
AST: 21 U/L (ref 0–37)
Chloride: 107 mEq/L (ref 96–112)
Creat: 1.29 mg/dL (ref 0.50–1.35)
Sodium: 140 mEq/L (ref 135–145)
Total Bilirubin: 0.3 mg/dL (ref 0.3–1.2)
Total Protein: 6.7 g/dL (ref 6.0–8.3)

## 2012-11-28 LAB — PSA, MEDICARE: PSA: 1.91 ng/mL (ref <=4.00)

## 2012-12-01 ENCOUNTER — Other Ambulatory Visit: Payer: Self-pay | Admitting: Cardiovascular Disease

## 2012-12-01 DIAGNOSIS — I350 Nonrheumatic aortic (valve) stenosis: Secondary | ICD-10-CM

## 2012-12-01 DIAGNOSIS — I739 Peripheral vascular disease, unspecified: Secondary | ICD-10-CM

## 2012-12-10 ENCOUNTER — Telehealth: Payer: Self-pay | Admitting: *Deleted

## 2012-12-10 NOTE — Telephone Encounter (Signed)
Attempted to call patient on 12/08/2012 & 12/09/2012; left voicemail.  Mailed lab results to patient on 12/10/2012.

## 2012-12-18 ENCOUNTER — Ambulatory Visit (HOSPITAL_COMMUNITY)
Admission: RE | Admit: 2012-12-18 | Discharge: 2012-12-18 | Disposition: A | Discharge status: Home / Self Care | Payer: Medicare Other | Source: Ambulatory Visit | Attending: Cardiovascular Disease | Admitting: Cardiovascular Disease

## 2012-12-18 DIAGNOSIS — I498 Other specified cardiac arrhythmias: Secondary | ICD-10-CM | POA: Insufficient documentation

## 2012-12-18 DIAGNOSIS — I4510 Unspecified right bundle-branch block: Secondary | ICD-10-CM | POA: Insufficient documentation

## 2012-12-18 DIAGNOSIS — E785 Hyperlipidemia, unspecified: Secondary | ICD-10-CM | POA: Insufficient documentation

## 2012-12-18 DIAGNOSIS — I2510 Atherosclerotic heart disease of native coronary artery without angina pectoris: Secondary | ICD-10-CM | POA: Insufficient documentation

## 2012-12-18 DIAGNOSIS — I359 Nonrheumatic aortic valve disorder, unspecified: Secondary | ICD-10-CM | POA: Insufficient documentation

## 2012-12-18 DIAGNOSIS — I350 Nonrheumatic aortic (valve) stenosis: Secondary | ICD-10-CM

## 2012-12-18 NOTE — Progress Notes (Signed)
[facility]   2D echo completed 12/18/2012.   Cindy Pritika Alvarez, RDCS  

## 2012-12-19 ENCOUNTER — Telehealth: Payer: Self-pay | Admitting: Cardiovascular Disease

## 2012-12-19 NOTE — Telephone Encounter (Signed)
Ralph Drake is returning your call .[name]

## 2012-12-23 NOTE — Telephone Encounter (Signed)
Talked with pt. About his test results and plan of action

## 2012-12-25 ENCOUNTER — Encounter: Payer: Medicare Other | Admitting: Thoracic Surgery (Cardiothoracic Vascular Surgery)

## 2013-01-09 HISTORY — PX: CARDIAC CATHETERIZATION: SHX172

## 2013-01-15 ENCOUNTER — Encounter: Payer: Self-pay | Admitting: Cardiothoracic Surgery

## 2013-01-15 ENCOUNTER — Institutional Professional Consult (permissible substitution) (INDEPENDENT_AMBULATORY_CARE_PROVIDER_SITE_OTHER): Payer: Medicare Other | Admitting: Cardiothoracic Surgery

## 2013-01-15 ENCOUNTER — Encounter: Payer: Self-pay | Admitting: Cardiovascular Disease

## 2013-01-15 VITALS — BP 130/70 | HR 70 | Resp 20 | Ht 65.0 in | Wt 130.1 lb

## 2013-01-15 DIAGNOSIS — I359 Nonrheumatic aortic valve disorder, unspecified: Secondary | ICD-10-CM

## 2013-01-15 DIAGNOSIS — I350 Nonrheumatic aortic (valve) stenosis: Secondary | ICD-10-CM

## 2013-01-15 NOTE — Patient Instructions (Addendum)
Change ASA to 81 mg / day No advil or Motrin Stop Pletal ( Cilostazol )    Aortic Stenosis Aortic stenosis, or aortic valve stenosis, is a narrowing of the aortic valve. When the aortic valve is narrowed, the valve does not open and close very well. This restricts blood flow between the left side of the heart and the aorta (the large artery which takes blood to the rest of the body). This restriction makes it hard for your heart to pump blood. This extra work can weaken your heart and can lead to heart failure. CAUSES  Causes of aortic valve stenosis can vary. Some of these can include:  Calcium deposits on the aortic valve. Calcium can buildup on the aortic valve and make it stiff. This cause of aortic stenosis is most common in people over the age of 73.  Congenital heart defect. This can occur during the development of the fetus and can result in an aortic valve defect.  Rheumatic fever. Rheumatic fever is a bacterial infection that can develop from a strep throat infection. The bacteria from rheumatic fever can attach themselves to the valve. This can cause scarring on the aortic valve, causing it to become narrow. SYMPTOMS  Symptoms of aortic valve stenosis develop when the valve disease is severe. Symptoms can include:  Shortness of breath, especially with physical activity.  Feeling tired (fatigue).  Chest pain (angina) or tightness.  Feeing your heart race or beat funny (heart palpitations).  Dizziness or fainting. DIAGNOSIS  Aortic stenosis is diagnosed through:  A physical exam and symptoms.  A heart murmur.  Echocardiography. This test uses sound waves to produce images of your heart. TREATMENT   Surgery is the treatment for aortic valve stenosis.  Surgery may not be needed right away. Surgery is necessary when narrowing of the aortic valve becomes severe, and symptoms develop or become worse.  Medications cannot reverse aortic valve stenosis. HOME CARE  INSTRUCTIONS   If you have aortic stenosis, you many need to avoid strenuous physical activity. Talk with your caregiver about what types of activities you should avoid.  If you are a woman with aortic valve stenosis and are of child-bearing age, talk to your caregiver before you become pregnant.  If you become pregnant, you will need to be monitored by your obstetrician and cardiologist throughout your pregnancy, labor and delivery, and after delivery. SEEK IMMEDIATE MEDICAL CARE IF:  You develop chest pain or tightness.  You develop shortness of breath or difficulty breathing.  You develop lightheadedness or fainting.  You have heart palpitations or skipped heartbeats. Document Released: 02/24/2003 Document Revised: 08/20/2011 Document Reviewed: 10/04/2009 [facility] Patient Information 2014 [place], [place]. Aortic Valve Replacement You have a disease of one of the valves of your heart. In you or your child's case, it is the aortic valve which needs replacing. Aortic valve replacement is open heart surgery done by a heart surgeon. This operation treats problems with the aortic valve. The aortic valve is the "outflow valve" for the left side of the heart. The left side of your heart (left ventricle) is the large muscular part of the heart that pumps blood to the rest of the body. It separates the left ventricle from the aorta. When the heart squeezes down (contracts), the aortic valve is what keeps the blood from flowing back into the ventricle from the aorta. This allows the blood to keep moving through the body.  Surgery may be necessary when the valve does not open or  close completely. A stenotic (narrow) valve does not let the blood leave the heart normally. This causes blood to back up in the left ventricle. This makes it hard for the heart to increase the amount of blood that it pumps. The heart has to work harder. This may produce shortness of breath and fatigue. Problems are worse with  activity.  If the valve leaflets do not meet correctly when closing, blood may leak backward into the ventricle each time the heart pumps. This is called aortic insufficiency. When some of the blood leaks backwards, the heart has to work even harder. The heart can allow for this over-work for a long time if the leakage came on slowly. Eventually, the heart fails.  Aortic valve problems may be caused by a birth defect. This is called congenital. Wear and tear can cause valves to fail. More commonly, rheumatic fever may damage the aortic valve. Occasionally, the valve may be damaged by infection. This also causes the aortic valve to leak.  DESCRIPTION OF SURGERY Aortic valves can be repaired. When the valve is too damaged to repair, the valve must be replaced. A prosthetic (artificial) valve is used to do this. Valves damaged by rheumatic disease often must be replaced.  Two types of artificial valves are available:  Mechanical valves made entirely from man-made materials.  Biological valves which are made from animal tissues or taken from a cadaver. Each has advantages and disadvantages. The choice of which type to use should be made by you and your surgeon. Your risks, age, lifestyle, other medical problems including the decision on whether to be on blood thinners the rest of your life all will help you decide on which type of valve to use. There are a number of good MECHANICAL PROSTHESES available. All work well. The main advantage of mechanical valves is that they do not wear out. Their main disadvantage is that blood clots easier on mechanical valves. If this happens the valve will not work normally. Because of this, patients with mechanical valves must take anticoagulants (blood thinners) for life. There is also a small but definite risk of blood clots causing stroke, even when taking anticoagulants.  There are a number of BIOLOGICAL CHOICES for aortic valve replacement. Most are made from pig aortic  valves. Some are taken from cadavers. The main advantage is that they have a reduced risk of blood clots forming on the valve. This lessens the chance of the valve not working or causing a stroke. A large disadvantage of biological or tissue valves is that they wear out sooner than mechanical valves. The rate at which they wear out depends on the patient's age. A young boy might wear out such a valve in only a few years. The same valve might last 10 years in a middle aged person, and even longer in a patient over the age of 39. A tissue valve used in a person over 55 years old may never need replacement. RISKS AND COMPLICATIONS Your cardiologist and cardiothoracic surgeon can best determine your individual risk. It will depend on your age, general condition, medical conditions, and your heart function. In general, the risks include:  Problems from the operation itself are low risk. Some common risks are:  Risks from the anesthesia.  Bleeding and infection.  Lifelong treatment with medications to prevent blood clots is needed for mechanical valve replacements.  Infection is more common with valve replacement than with valve repair.  Valve failure is more common with valve replacement than  with valve repair. Pig valves tend to fail after about 8 to 10 years. PROCEDURE  Valve repair or replacement is open-heart surgery. You are given general anesthesia (medications to help you sleep). You are then placed on a heart-lung machine. This machine provides oxygen to your blood while the heart is not working. The surgery generally lasts from 3 to 5 hours. During surgery, the surgeon makes a large incision (cut) in the chest. Sometimes the heart is cooled to slow or stop the heartbeat. The damaged aortic valve is either repaired or removed and replaced with an artificial heart valve. AFTER THE PROCEDURE  Recovery from heart valve surgery usually involves a few days in an intensive care unit (ICU) of a  hospital. Full recovery from heart valve surgery can take several months.  Anticoagulation (blood thinning) treatment with warfarin is often prescribed for 6 weeks to 3 months after surgery for those with biological valves. It is prescribed for life for those with mechanical valves.  Recovery includes healing of the surgical incision. There is a gradual building of stamina and exercise abilities. An exercise program under the direction of a physical therapist may be recommended.  Once you have an artificial valve, your heart function and your life will return to normal. You usually feel better after surgery. Shortness of breath and fatigue should lessen. If your heart was already severely damaged before your surgery, you may continue to have problems.  You can usually resume most of your normal activities. You will have to continue to monitor your condition. You need to watch out for blood clots and infections.  Artificial valves need to be replaced after a period of time. It is important that you see your caregiver regularly.  Some individuals with an aortic valve replacement need to take antibiotics before having dental work or other surgical procedures. This is called prophylactic antibiotic treatment. These drugs help to prevent infective endocarditis. Antibiotics are only recommended for individuals with the highest risk for developing infective endocarditis. Let your dentist and your caregiver know if you have a history of any of the following so that the necessary precautions can be taken:  A VSD.  A repaired VSD.  Endocarditis in the past.  An artificial (prosthetic) heart valve. HOME CARE INSTRUCTIONS   Use all medications as prescribed.  Take your temperature every morning for the first week after surgery. Record these.  Weigh yourself every morning for at least the first week after surgery and record.  Do not lift more than 10 pounds (4.5 kg) until your sternum (breastbone)  has healed. Avoid all activities which would place strain on your incision.  You may shower but do not take baths until instructed by your caregivers.  Avoid driving for 4 to 6 weeks following surgery or as instructed.  Use your elastic stockings during the day. You should wear the stockings for at least 2 weeks after discharge or longer if your ankles are swollen. The stockings help blood flow and help reduce swelling in the legs. It is easiest to put the stockings on before you get out of bed in the morning. They should fit snugly. SEEK IMMEDIATE MEDICAL CARE IF:  You develop chest pain which is not coming from your incision (surgical cut) .  You develop shortness of breath.  You develop a temperature over 101 F (38.3 C).  You have a sudden weight gain. Let your caregiver know what the weight gain is. Document Released: 10/17/2004 Document Revised: 08/20/2011 Document Reviewed: 05/24/2008  [employer] Patient Information 2014 [place], [place]. Aortic Valve Replacement Care After Refer to this sheet in the next few weeks. These instructions provide you with information on caring for yourself after your procedure. Your caregiver may also give you specific instructions. Your treatment has been planned according to current medical practices, but problems sometimes occur. Call your caregiver if you have any problems or questions after your procedure. HOME CARE INSTRUCTIONS   Only take over-the-counter or prescription medicines as directed by your caregiver.  Take your temperature every morning for the first 7 days after surgery. Write these down. Call your caregiver if your temperature stays above 100 F (37.8 C) for more than a day.   Weigh yourself every morning for at least 7 days after surgery. Write your weight down to monitor any weight increase.  Wear elastic stockings during the day for at least 2 weeks after surgery. Use them longer if your ankles are swollen. The stockings help  blood flow and help reduce swelling in the legs.  Take frequent naps or rest often throughout the day.  Avoid lifting over 10 lbs (4.5 kg) or pushing or pulling things with your arms for 6 8 weeks or as directed by your caregiver.  Avoid driving or airplane travel for 4 6 weeks after surgery or as directed. If you are riding in a car for an extended period, stop every 1 2 hours to stretch your legs. Keep a record of your medicines and medical history with you when traveling.  Do not cross your legs.  Do not take baths for 4 6 weeks after surgery. Take showers once your caregiver approves. Pat incisions dry. Do not rub incisions with a washcloth or towel.  Avoid climbing stairs and using the handrail to pull yourself up for the first 2 3 weeks after surgery.  Return to work as directed by your caregiver.  Drink enough fluids to keep your urine clear or pale yellow.  Do not strain to have a bowel movement. Eat high-fiber foods if you become constipated. You may also take a medicine to help you have a bowel movement (laxative) as directed by your caregiver.  Resume sexual activity as directed by your caregiver. Men should not use medicines for erectile dysfunction until their doctor says it isokay.  If you had a certain type of heart condition in the past, you may need to take antibiotic medicine before having dental work or surgery. Let your dentist and caregivers know if you had one or more of the following:  Previous endocarditis.  An artificial (prosthetic) heart valve.  Congenital heart disease. SEEK MEDICAL CARE IF:  You develop a skin rash.   Your weight is increasing each day over 2 3 days, or you have a sudden weight gain.  Your weight increases by 2 or more pounds (1 kg or more) in a single day. SEEK IMMEDIATE MEDICAL CARE IF:   You develop chest pain that is not coming from your incision.   You develop shortness of breath or have difficulty breathing.   You have  a fever.   You have increased bleeding from your wounds.   You have increasing wound pain.   You have redness or swelling around your wounds  You have pus coming from your wound.   You develop lightheadedness.  MAKE SURE YOU:   Understand these directions.  Will watch your condition.  Will get help right away if you are not doing well or get worse. Document Released: 12/14/2004 Document  Revised: 05/14/2012 Document Reviewed: 03/11/2012 [facility] Patient Information 2014 [place], [place].

## 2013-01-15 NOTE — Progress Notes (Signed)
[address]       [name] [address]             [phone]                    Ralph Drake [facility] Medical Record #[id number] Date of Birth: 1934-10-04  Referring: Governor Rooks, MD Primary Care: Aura Dials, MD  Chief Complaint:    Chief Complaint  Patient presents with  . Aortic Stenosis    Surgical eval for AVR, 2D ECHO 12/18/2012    History of Present Illness:    Patient is a 78 year old male with known aortic stenosis. He has been followed with serial echocardiograms. He has known about the aortic stenosis for 3-4 years. He now notes increasing fatigue with exertion shortness of breath with exertion, he denies resting shortness of breath orthopnea syncope presyncope or angina. Because of worsening degree of aortic stenosis and symptoms he is referred by Dr. Alanda Amass for consideration of aortic valve replacement. The patient has known coronary occlusive disease having had a angioplasty in 1993 and stents placed in June of 2011. He has known peripheral vascular disease. He is a current smoker.      Current Activity/ Functional Status:  Patient is independent with mobility/ambulation, transfers, ADL's, IADL's.  Zubrod Score: At the time of surgery this patient's most appropriate activity status/level should be described as: []  Normal activity, no symptoms [x]  Symptoms, fully ambulatory []  Symptoms, in bed less than or equal to 50% of the time []  Symptoms, in bed greater than 50% of the time but less than 100% []  Bedridden []  Moribund   Past Medical History  Diagnosis Date  . Hypertension   . GERD (gastroesophageal reflux disease)   . Coronary artery disease   . Heart murmur     per Dr. Alanda Amass 2012  . Arthritis   . Peripheral vascular disease     Ultrasound of legs 07/12/2011  . Trigger ring finger of left hand   . Olecranon bursitis of left elbow     Past Surgical History  Procedure Laterality Date  . Coronary  angioplasty      june 2011  . Tonsillectomy      as child  . Right hand surgery    . Trigger finger release  07/23/2011    Procedure: RELEASE TRIGGER FINGER/A-1 PULLEY;  Surgeon: Nilda Simmer, MD;  Location: [facility];  Service: Orthopedics;  Laterality: Left;  release trigger left ring finger patient had supraclavicular block in preop  . Olecranon bursectomy  07/23/2011    Procedure: OLECRANON BURSA;  Surgeon: Nilda Simmer, MD;  Location: [facility];  Service: Orthopedics;  Laterality: Left;  excision olecranon bursa left elbow patient had supraclaviular block in preop    Family History  Problem Relation Age of Onset  . Hypertension Other     History   Social History  . Marital Status: Married    Spouse Name: N/A    Number of Children: N/A  . Years of Education: N/A   Occupational History  . Not on file.   Social History Main Topics  . Smoking status: Current Every Day Smoker -- 0.50 packs/day  . Smokeless tobacco: Not on file  . Alcohol Use: No  . Drug Use: No  . Sexually Active:    Other Topics Concern  . Not on file   Social History Narrative  . No narrative on file  History  Smoking status  . Current Every Day Smoker -- 0.50 packs/day  Smokeless tobacco  . Not on file    History  Alcohol Use No     No Known Allergies  Current Outpatient Prescriptions  Medication Sig Dispense Refill  . amLODipine (NORVASC) 5 MG tablet Take 5 mg by mouth daily.      [name] aspirin 325 MG EC tablet Take 325 mg by mouth daily.      [name] atorvastatin (LIPITOR) 40 MG tablet Take 40 mg by mouth daily.      . cilostazol (PLETAL) 50 MG tablet Take 50 mg by mouth 2 (two) times daily.      [name] ezetimibe (ZETIA) 10 MG tablet Take 10 mg by mouth daily.      . fish oil-omega-3 fatty acids 1000 MG capsule Take 1 g by mouth daily.      [name] lisinopril (PRINIVIL,ZESTRIL) 20 MG tablet Take 20 mg by mouth daily.      . multivitamin (THERAGRAN) per tablet Take 1  tablet by mouth daily.      . nebivolol (BYSTOLIC) 2.5 MG tablet Take 1.25 mg by mouth every other day. 1/2 a tablet every other day      . omeprazole (PRILOSEC) 20 MG capsule Take 20 mg by mouth daily.      . vitamin E 1000 UNIT capsule Take 1,000 Units by mouth daily.       No current facility-administered medications for this visit.       Review of Systems:     Cardiac Review of Systems: Y or N  Chest Pain [ n   ]  Resting SOB [ n  ] Exertional SOB  [ y ]  Pollyann Kennedy Milo.Brash  ]   Pedal Edema [  n ]    Palpitations [ n ] Syncope  [ n ]   Presyncope [n   ]  General Review of Systems: [Y] = yes [  ]=no Constitional: recent weight change [  ]; anorexia [  ]; fatigue [ y ]; nausea [  n]; night sweats [ n]; fever [ n ]; or chills [  n];                                                                                                                                          Dental: poor dentition[  ]; Last Dentist visit:   Eye : blurred vision [  ]; diplopia [   ]; vision changes [  ];  Amaurosis fugax[  ]; Resp: cough [ n ];  wheezing[ n ];  hemoptysis[ n ]; shortness of breath[  ]; paroxysmal nocturnal dyspnea[ n ]; dyspnea on exertion[ y ]; or orthopnea[n  ];  GI:  gallstones[  ], vomiting[  ];  dysphagia[  ]; melena[n  ];  hematochezia [ n ]; heartburn[  n];   Hx of  Colonoscopy[y  ]; GU: kidney stones [  ]; hematuria[  ];   dysuria [  ];  nocturia[  ];  history of     obstruction [  ]; urinary frequency [n  ]             Skin: rash, swelling[  ];, hair loss[  ];  peripheral edema[  ];  or itching[  ]; Musculosketetal: myalgias[  ];  joint swelling[  ];  joint erythema[  ];  joint pain[  ];  back pain[  ];  Heme/Lymph: bruising[ n ];  bleeding[ n ];  anemia[  ];  Neuro: TIA[  ];  headaches[  ];  stroke[  ];  vertigo[  ];  seizures[ n ];   paresthesias[  ];  difficulty walking[ n ];  Psych:depression[  ]; anxiety[  ];  Endocrine: diabetes[ n ];  thyroid dysfunction[n  ];  Immunizations: Flu [ y  ]; Pneumococcal[ y ];  Other:  Physical Exam: BP 130/70  Pulse 70  Resp 20  Ht 5\' 5"  (1.651 m)  Wt 130 lb 1.1 oz (59 kg)  BMI 21.65 kg/m2  SpO2 94%  General appearance: alert, cooperative and appears stated age Neurologic: intact Heart: regular rate and rhythm and systolic murmur: holosystolic 3/6, crescendo throughout the precordium Lungs: clear to auscultation bilaterally and normal percussion bilaterally Abdomen: soft, non-tender; bowel sounds normal; no masses,  no organomegaly Extremities: extremities normal, atraumatic, no cyanosis or edema and Homans sign is negative, no sign of DVT Patient has no carotid bruits, he has no cervical or supraclavicular or axillary adenopathy Has +2 femoral pulses bilaterally 1+ DP and PT pulses bilaterally. Appears to have adequate vein for coronary bypass grafting in both lower extremities  Diagnostic Studies & Laboratory data:     Recent Radiology Findings:  No results found.   Recent Lab Findings: Lab Results  Component Value Date   WBC 7.5 11/28/2012   HGB 13.9 11/28/2012   HCT 40.6 11/28/2012   PLT 212 11/28/2012   GLUCOSE 99 11/28/2012   CHOL 156 11/28/2012   TRIG 54 11/28/2012   HDL 53 11/28/2012   LDLCALC 92 11/28/2012   ALT 16 11/28/2012   AST 21 11/28/2012   NA 140 11/28/2012   K 5.2 11/28/2012   CL 107 11/28/2012   CREATININE 1.29 11/28/2012   BUN 28* 11/28/2012   CO2 27 11/28/2012   TSH 2.785 Test methodology is 3rd generation TSH 11/10/2009   INR 0.89 11/10/2009   HGBA1C  Value: 6.1 (NOTE)                                                                       According to the ADA Clinical Practice Recommendations for 2011, when HbA1c is used as a screening test:   >=6.5%   Diagnostic of Diabetes Mellitus           (if abnormal result  is confirmed)  5.7-6.4%   Increased risk of developing Diabetes Mellitus  References:Diagnosis and Classification of Diabetes Mellitus,Diabetes Care,2011,34(Suppl 1):S62-S69 and Standards of Medical Care in          Diabetes - 2011,Diabetes Care,2011,34  (Suppl 1):S11-S61.* 11/10/2009   ECHO: Patient: Eeshan, [place] MR #: [id number] Study Date:  12/18/2012 Gender: M Age: 31 Height: 165.1cm Weight: 59kg BSA: 1.38m^2 Pt. Status: Room:  Cheron Schaumann, MD [facility] PERFORMING Susa Griffins, MD [facility] REFERRING Susa Griffins, MD [facility] ATTENDING Nanetta Batty, MD SONOGRAPHER Metro Kung, RDCS cc:  ------------------------------------------------------------ LV EF: 55% - 60%  ------------------------------------------------------------ Indications: Aortic stenosis 424.1.  ------------------------------------------------------------ History: PMH: Coronary artery disease. Aortic valve disease. Risk factors: RBBB, bradycardia Current tobacco use. Dyslipidemia.  ------------------------------------------------------------ Study Conclusions  - Left ventricle: The cavity size was mildly reduced. There was moderate concentric hypertrophy. Systolic function was normal. The estimated ejection fraction was in the range of 55% to 60%. Wall motion was normal; there were no regional wall motion abnormalities. Doppler parameters are consistent with abnormal left ventricular relaxation (grade 1 diastolic dysfunction). - Aortic valve: Trileaflet; severely calcified leaflets; fusion of the left-noncoronary commissure. Cusp separation was severely reduced. There was severe stenosis. Mild regurgitation directed eccentrically in the LVOT. Valve area: 0.73cm^2(VTI). Valve area: 0.73cm^2 (Vmax). - Mitral valve: Calcified annulus. Moderately calcified leaflets posterior. Mild regurgitation. Valve area by continuity equation (using LVOT flow): 1.93cm^2. - Left atrium: The atrium was mildly to moderately dilated. - Right ventricle: Systolic pressure was increased. - Atrial septum: No defect or patent foramen ovale was identified. - Pulmonary arteries: PA peak pressure: 31mm Hg  (S). Impressions:  - severe-critical calcific AS with some leaflet openi9ng seen. Concern because of extent of calcification, small LV cavity with moderately -severe LVH. Transthoracic echocardiography. M-mode, complete 2D, spectral Doppler, and color Doppler. Height: Height: 165.1cm. Height: 65in. Weight: Weight: 59kg. Weight: 129.7lb. Body mass index: BMI: 21.6kg/m^2. Body surface area: BSA: 1.47m^2. Blood pressure: 110/70. Patient status: Outpatient. Location: Echo laboratory.  ------------------------------------------------------------  ------------------------------------------------------------ Left ventricle: The cavity size was mildly reduced. There was moderate-severe concentric hypertrophy. Systolic function was normal. The estimated ejection fraction was in the range of 55% to 60%. Wall motion was normal; there were no regional wall motion abnormalities. Doppler parameters are consistent with abnormal left ventricular relaxation (grade 1 diastolic dysfunction).  ------------------------------------------------------------ Aortic valve: Trileaflet; severely calcified leaflets; fusion of the left-noncoronary commissure. Cusp separation was severely reduced. Doppler: There was severe stenosis. Mild regurgitation directed eccentrically in the LVOT. Valve area: 0.73cm^2(VTI). Indexed valve area: 0.44cm^2/m^2 (VTI). Valve area: 0.73cm^2 (Vmax). Indexed valve area: 0.44cm^2/m^2 (Vmax). Mean gradient: 36mm Hg (S). Peak gradient: 80mm Hg (S).  ------------------------------------------------------------ Aorta: The aorta was normal, not dilated, and non-diseased.  ------------------------------------------------------------ Mitral valve: Calcified annulus. Moderately calcified leaflets posterior. No echocardiographic evidence for prolapse. Doppler: There was no evidence for stenosis. Mild regurgitation. Valve area by pressure half-time: 2.62cm^2. Indexed valve area by  pressure half-time: 1.59cm^2/m^2. Valve area by continuity equation (using LVOT flow): 1.93cm^2. Indexed valve area by continuity equation (using LVOT flow): 1.17cm^2/m^2. Mean gradient: 1mm Hg (D).  ------------------------------------------------------------ Left atrium: LA Volume/BSA 39.4 ml/m2. The atrium was mildly to moderately dilated.  ------------------------------------------------------------ Atrial septum: No defect or patent foramen ovale was identified.  ------------------------------------------------------------ Right ventricle: The cavity size was normal. Wall thickness was normal. Systolic function was normal. Systolic pressure was increased.  ------------------------------------------------------------ Pulmonic valve: Doppler: Trivial regurgitation.  ------------------------------------------------------------ Right atrium: The atrium was at the upper limits of normal in size.  ------------------------------------------------------------ Pericardium: The pericardium was normal in appearance. There was no pericardial effusion.  ------------------------------------------------------------ Systemic veins: Inferior vena cava: The vessel was normal in size; the respirophasic diameter changes were in the normal range (= 50%); findings are consistent with normal central venous pressure.  ------------------------------------------------------------ Post procedure conclusions Ascending Aorta:  - The aorta was  normal, not dilated, and non-diseased.  ------------------------------------------------------------  2D measurements Normal Doppler measurements Normal Left ventricle Main pulmonary LVID ED, 41.8 mm 43-52 artery chord, Pressure, 31 mm Hg =30 PLAX S LVID ES, 21.8 mm 23-38 Left ventricle chord, Ea, lat 5.7 cm/s ------ PLAX ann, tiss FS, chord, 48 % >29 DP PLAX E/Ea, lat 11.1 ------ LVPW, ED 16.6 mm ------ ann, tiss 4 IVS/LVPW 0.99 <1.3 DP ratio,  ED Ea, med 4.8 cm/s ------ Ventricular septum ann, tiss IVS, ED 16.4 mm ------ DP LVOT E/Ea, med 13.2 ------ Diam, S 20 mm ------ ann, tiss 3 Area 3.14 cm^2 ------ DP Aorta Aortic valve Root diam, 37 mm ------ Peak vel, 430 cm/s ------ ED S Left atrium Mean vel, 273 cm/s ------ AP dim 49 mm ------ S AP dim 2.97 cm/m^2 <2.2 VTI, S 123 cm ------ index Mean 36 mm Hg ------ gradient, S Peak 80 mm Hg ------ gradient, S Area, VTI 0.73 cm^2 ------ Area index 0.44 cm^2/m ------ (VTI) ^2 Area, Vmax 0.73 cm^2 ------ Area index 0.44 cm^2/m ------ (Vmax) ^2 Regurg PHT 776 ms ------ Mitral valve Peak E vel 63.5 cm/s ------ Peak A vel 79.9 cm/s ------ Mean vel, 46.4 cm/s ------ D Decelerati 406 ms 150-23 on time 0 Pressure 84 ms ------ half-time Mean 1 mm Hg ------ gradient, D Peak E/A 0.8 ------ ratio Area (PHT) 2.62 cm^2 ------ Area index 1.59 cm^2/m ------ (PHT) ^2 Area 1.93 cm^2 ------ (LVOT) continuity Area index 1.17 cm^2/m ------ (LVOT ^2 cont) Annulus 50 cm ------ VTI Tricuspid valve Regurg 253 cm/s ------ peak vel Peak RV-RA 26 mm Hg ------ gradient, S Systemic veins Estimated 5 mm Hg ------ CVP Right ventricle Pressure, 31 mm Hg <30 S Sa vel, 12.7 cm/s ------ lat ann, tiss DP  ------------------------------------------------------------ Prepared and Electronically Authenticated by  Susa Griffins, MD [facility] 2014-07-10T15:03:56.023   Assessment / Plan:     Patient with known coronary occlusive disease now presents with symptomatic critical aortic stenosis with a aortic valve velocity of 4.5 cm/s. I've spent 80 minutes reviewing the patient's echocardiograms, history, old cath films and reviewing with he and his wife the diagnosis of critical aortic stenosis the options of treatment have recommended to him proceeding with aortic valve replacement possible coronary artery bypass grafting. We discussed the use of a tissue valve to avoid long-term  Coumadin. The patient needs a recent cardiac catheterization before proceeding. We will contact Dr. Kandis Cocking office to have this done. I will see the patient back in the office after the cardiac catheterization is been completed and tentatively plan for surgery in the first or second week of September per the patient's request.   Delight Ovens MD      [address] [place] [place] [address] Office [phone]   Beeper [phone]  01/15/2013 12:02 PM

## 2013-01-16 ENCOUNTER — Encounter: Payer: Self-pay | Admitting: Cardiology

## 2013-01-22 ENCOUNTER — Ambulatory Visit: Payer: Medicare Other | Admitting: Cardiothoracic Surgery

## 2013-01-23 ENCOUNTER — Other Ambulatory Visit: Payer: Self-pay | Admitting: *Deleted

## 2013-01-23 ENCOUNTER — Encounter (HOSPITAL_COMMUNITY): Payer: Self-pay | Admitting: Pharmacy Technician

## 2013-01-23 DIAGNOSIS — Z01812 Encounter for preprocedural laboratory examination: Secondary | ICD-10-CM

## 2013-01-26 ENCOUNTER — Other Ambulatory Visit: Payer: Self-pay | Admitting: Cardiovascular Disease

## 2013-01-26 LAB — CBC WITH DIFFERENTIAL/PLATELET
Basophils Absolute: 0 10*3/uL (ref 0.0–0.1)
Basophils Relative: 0 % (ref 0–1)
Eosinophils Absolute: 0.2 10*3/uL (ref 0.0–0.7)
MCHC: 34.2 g/dL (ref 30.0–36.0)
Neutro Abs: 4.7 10*3/uL (ref 1.7–7.7)
Neutrophils Relative %: 60 % (ref 43–77)
Platelets: 207 10*3/uL (ref 150–400)
RDW: 14.9 % (ref 11.5–15.5)

## 2013-01-26 LAB — COMPREHENSIVE METABOLIC PANEL
AST: 22 U/L (ref 0–37)
Alkaline Phosphatase: 80 U/L (ref 39–117)
BUN: 28 mg/dL — ABNORMAL HIGH (ref 6–23)
Calcium: 10 mg/dL (ref 8.4–10.5)
Chloride: 102 mEq/L (ref 96–112)
Creat: 1.2 mg/dL (ref 0.50–1.35)
Glucose, Bld: 100 mg/dL — ABNORMAL HIGH (ref 70–99)

## 2013-01-26 LAB — PROTIME-INR
INR: 0.88 (ref <1.50)
Prothrombin Time: 12 seconds (ref 11.6–15.2)

## 2013-01-27 LAB — URINALYSIS, ROUTINE W REFLEX MICROSCOPIC
Bilirubin Urine: NEGATIVE
Glucose, UA: NEGATIVE mg/dL
Leukocytes, UA: NEGATIVE
Protein, ur: NEGATIVE mg/dL
pH: 6 (ref 5.0–8.0)

## 2013-01-28 ENCOUNTER — Ambulatory Visit (HOSPITAL_COMMUNITY): Payer: Medicare Other

## 2013-01-28 ENCOUNTER — Ambulatory Visit (HOSPITAL_COMMUNITY)
Admission: RE | Admit: 2013-01-28 | Discharge: 2013-01-28 | Disposition: A | Discharge status: Home / Self Care | Payer: Medicare Other | Source: Ambulatory Visit | Attending: Cardiovascular Disease | Admitting: Cardiovascular Disease

## 2013-01-28 ENCOUNTER — Encounter (HOSPITAL_COMMUNITY)
Admission: RE | Disposition: A | Discharge status: Home / Self Care | Payer: Self-pay | Source: Ambulatory Visit | Attending: Cardiovascular Disease

## 2013-01-28 ENCOUNTER — Encounter (HOSPITAL_COMMUNITY): Payer: Self-pay | Admitting: Cardiology

## 2013-01-28 DIAGNOSIS — M7020 Olecranon bursitis, unspecified elbow: Secondary | ICD-10-CM | POA: Insufficient documentation

## 2013-01-28 DIAGNOSIS — K219 Gastro-esophageal reflux disease without esophagitis: Secondary | ICD-10-CM | POA: Insufficient documentation

## 2013-01-28 DIAGNOSIS — Z01812 Encounter for preprocedural laboratory examination: Secondary | ICD-10-CM

## 2013-01-28 DIAGNOSIS — Z7982 Long term (current) use of aspirin: Secondary | ICD-10-CM | POA: Insufficient documentation

## 2013-01-28 DIAGNOSIS — I4510 Unspecified right bundle-branch block: Secondary | ICD-10-CM | POA: Insufficient documentation

## 2013-01-28 DIAGNOSIS — M129 Arthropathy, unspecified: Secondary | ICD-10-CM | POA: Insufficient documentation

## 2013-01-28 DIAGNOSIS — Z9861 Coronary angioplasty status: Secondary | ICD-10-CM | POA: Insufficient documentation

## 2013-01-28 DIAGNOSIS — Z79899 Other long term (current) drug therapy: Secondary | ICD-10-CM | POA: Insufficient documentation

## 2013-01-28 DIAGNOSIS — I498 Other specified cardiac arrhythmias: Secondary | ICD-10-CM | POA: Insufficient documentation

## 2013-01-28 DIAGNOSIS — I2510 Atherosclerotic heart disease of native coronary artery without angina pectoris: Secondary | ICD-10-CM | POA: Insufficient documentation

## 2013-01-28 DIAGNOSIS — F17200 Nicotine dependence, unspecified, uncomplicated: Secondary | ICD-10-CM | POA: Insufficient documentation

## 2013-01-28 DIAGNOSIS — I10 Essential (primary) hypertension: Secondary | ICD-10-CM | POA: Insufficient documentation

## 2013-01-28 DIAGNOSIS — I359 Nonrheumatic aortic valve disorder, unspecified: Secondary | ICD-10-CM | POA: Insufficient documentation

## 2013-01-28 DIAGNOSIS — I350 Nonrheumatic aortic (valve) stenosis: Secondary | ICD-10-CM

## 2013-01-28 DIAGNOSIS — I70209 Unspecified atherosclerosis of native arteries of extremities, unspecified extremity: Secondary | ICD-10-CM | POA: Insufficient documentation

## 2013-01-28 HISTORY — PX: LEFT AND RIGHT HEART CATHETERIZATION WITH CORONARY ANGIOGRAM: SHX5449

## 2013-01-28 LAB — POCT I-STAT 3, ART BLOOD GAS (G3+)
Bicarbonate: 22.1 mEq/L (ref 20.0–24.0)
TCO2: 23 mmol/L (ref 0–100)
pCO2 arterial: 39.4 mmHg (ref 35.0–45.0)
pH, Arterial: 7.358 (ref 7.350–7.450)
pO2, Arterial: 48 mmHg — ABNORMAL LOW (ref 80.0–100.0)

## 2013-01-28 LAB — POCT I-STAT 3, VENOUS BLOOD GAS (G3P V)
O2 Saturation: 49 %
pCO2, Ven: 43.7 mmHg — ABNORMAL LOW (ref 45.0–50.0)
pH, Ven: 7.305 — ABNORMAL HIGH (ref 7.250–7.300)
pO2, Ven: 29 mmHg — CL (ref 30.0–45.0)

## 2013-01-28 SURGERY — LEFT AND RIGHT HEART CATHETERIZATION WITH CORONARY ANGIOGRAM
Anesthesia: LOCAL

## 2013-01-28 MED ORDER — ASPIRIN 81 MG PO CHEW
324.0000 mg | CHEWABLE_TABLET | ORAL | Status: AC
  Administered 2013-01-28: 324 mg via ORAL
  Filled 2013-01-28: qty 4

## 2013-01-28 MED ORDER — LIDOCAINE HCL (PF) 1 % IJ SOLN
INTRAMUSCULAR | Status: AC
  Filled 2013-01-28: qty 30

## 2013-01-28 MED ORDER — SODIUM CHLORIDE 0.9 % IV SOLN: INTRAVENOUS | Status: DC

## 2013-01-28 MED ORDER — NITROGLYCERIN 0.2 MG/ML ON CALL CATH LAB
INTRAVENOUS | Status: AC
  Filled 2013-01-28: qty 1

## 2013-01-28 MED ORDER — SODIUM CHLORIDE 0.9 % IJ SOLN: 3.0000 mL | INTRAMUSCULAR | Status: DC | PRN

## 2013-01-28 MED ORDER — HEPARIN SODIUM (PORCINE) 1000 UNIT/ML IJ SOLN
INTRAMUSCULAR | Status: AC
  Filled 2013-01-28: qty 1

## 2013-01-28 MED ORDER — MIDAZOLAM HCL 2 MG/2ML IJ SOLN
INTRAMUSCULAR | Status: AC
  Filled 2013-01-28: qty 2

## 2013-01-28 MED ORDER — HEPARIN (PORCINE) IN NACL 2-0.9 UNIT/ML-% IJ SOLN
INTRAMUSCULAR | Status: AC
  Filled 2013-01-28: qty 1000

## 2013-01-28 MED ORDER — MORPHINE SULFATE 2 MG/ML IJ SOLN: 1.0000 mg | INTRAMUSCULAR | Status: DC | PRN

## 2013-01-28 MED ORDER — ACETAMINOPHEN 325 MG PO TABS: 650.0000 mg | ORAL_TABLET | ORAL | Status: DC | PRN

## 2013-01-28 MED ORDER — SODIUM CHLORIDE 0.9 % IV SOLN: 1.0000 mL/kg/h | INTRAVENOUS | Status: DC

## 2013-01-28 MED ORDER — ONDANSETRON HCL 4 MG/2ML IJ SOLN: 4.0000 mg | Freq: Four times a day (QID) | INTRAMUSCULAR | Status: DC | PRN

## 2013-01-28 MED ORDER — VERAPAMIL HCL 2.5 MG/ML IV SOLN
INTRAVENOUS | Status: AC
  Filled 2013-01-28: qty 2

## 2013-01-28 NOTE — CV Procedure (Signed)
[facility] RIGHT & LEFT HEART CATHETERIZATION REPORT  NAME:  Ralph Drake   MRN: [id number] DOB:  [birth year]   ADMIT DATE: 01/28/2013 Procedure Date: 01/28/2013  INTERVENTIONAL CARDIOLOGIST: Marykay Lex, M.D., MS PRIMARY CARE PROVIDER: Aura Dials, MD PRIMARY CARDIOLOGIST: Susa Griffins, MD  PATIENT:  Ralph Drake is a 78 y.o. male with history of known coronary disease and severe aortic valve stenosis by recent echocardiography.  Ejection fraction was estimated 55-60% no regional wall motion abnormalities.  His valve area was estimated to be 0.73cm2, Peak/Mean Gradients 80/36 mmHg. He was seen by Dr. Tyrone Sage from CT surgery for pre-operative evaluation for aortic valve replacement, and is referred now for preop right and left heart catheterization.  PRE-OPERATIVE DIAGNOSIS:    Severe Aortic Stenosis  History of CAD s/p PTCA  PROCEDURES PERFORMED:    Right & Left Heart Catheterization with Native Coronary Angiography  via Right Radial Artery & Right Internal Jugular Vein Access.  Left Ventriculography  Consent: The procedure with Risks/Benefits/Alternatives and Indications was reviewed with the patient (and family).  All questions were answered.    PROCEDURE:  The patient was brought to the 2nd Floor [facility] in the fasting state and prepped and draped in the usual sterile fashion for Right groin or radial / brachial access.  A modified Allen's test with plethysmography was performed on the right wrist demonstrating adequate Ulnar Artery collateral flow.  Using sterile technique, a 18 gauge IV was inserted in the Right antecubital vein and capped by the cath lab RN.  Sterile technique was used including antiseptics, cap, gloves, gown, hand hygiene, mask and sheet.  Skin prep: Chlorhexidine;  Time Out: Verified patient identification, verified procedure, site/side was marked, verified correct patient position,  special equipment/implants available, medications/allergies/relevent history reviewed, required imaging and test results available.  Performed  Right Heart Catheterization The Right Neck visualized using direct Sonosite [place] to identify the Right Internal Jugular Vein (RIJV) Right Carotid Artery - images were captured.  The site was anesthetized using ~51ml lidocaine.  The RIJV was accessed under direct US guidance using the modified Seldinger technique, and a 7Fr sheath was inserted over the wire. Then a 7 Fr Theone Murdoch Catheter was advanced through the sheath, and with the balloon inflated once in the SVC, was advanced under fluoroscopic guidance into the first the Right Atrium, then through the Right Ventricle into the Main Pulmonary Artery and into the Wedge position.  Hemodynamics measurements were obtained in each location.  Simultaneous Oxygen saturation were recorded in both the Pulmonary Artery and the Central Aorta.  Simultaneous pressure measurements were obtained in the PA/PCWP and RV along with LV.  Thermodilution injections were then performed to calculate Cardiac Output by Fick Calculation..    Then the catheter was removed completely out of the body with the balloon deflated.  The Sheath was flushed with heparinized saline.  Left Heart Catheterization The right wrist was anesthetized with 1% subcutaneous Lidocaine.  The right radial artery was accessed using the Seldinger Technique with placement of a 5 Fr Glide Sheath. The sheath was aspirated and flushed.  Then a total of 10 ml of standard Radial Artery Cocktail (see medications) was infused.  Radial Cocktail: 5 mg Verapamil, 400 mcg NTG, 2 ml 2% Lidocaine in 10 ml NS.  5000 Units of IV Heparin was administered once the catheter reached the ascending aorta.  A 5 Fr TIG Catheter was advanced of over a Versicore wire into  the ascending Aorta.  The catheter was used to engage the Left and Right coronary artery.  Multiple cineangiographic views  of the Both coronary artery system(s) were performed.  The catheter was exchanged for a Gibson Ramp followed by a JR4 catheter that was finally used to cross the Aortic Valve.  LV hemodynamics were measured and Left Ventriculography was performed using Hand injection.  LV hemodynamics were then re-sampled, and the catheter was pulled back across the Aortic Valve for measurement of "pull-back" gradient.   Both sheaths were removed in the Cath Lab with a TR band placed at 37 ml Air at 1120 hours.  Reverse Allen's test revealed non-occlusive hemostasis and manual pressure held for the venous access.  MEDICATIONS:   ANESTHESIA: Local Lidocaine 6 ml   SEDATION: 1 mg IV Versed, 25 mcg IV fentanyl ; Premedication: 5 mg oral Valium   Omnipaque contrast 75  mL  EBL: < 10ml, not including ABG and VBG samples   FINDINGS:  Hemodynamics:  Findings:   SaO2%  Pressures mmHg  Mean P  mmHg  EDP  mmHg   Peak to Peak   AoV area cm2   AoV index cm2 /m2  Right Atrium    6/5    3      Right Ventricle    31/3    7      Pulmonary Artery   49%   31/11   20       PCWP    9/9   9       Central Aortic   82% (on room air)  146/51   78       Left Ventricle    176/80    16     Aortic valve gradient     24.9    30   0.86 - 0.87   0.52            Cardiac Output:   Cardiac Index:       Fick   3.39    2.03      Thermodilution   3.41    2.04        Coronary Anatomy:   Left Main: Large-caliber vessel that trifurcates into the LAD, Ramus Intermedius, and Circumflex LAD: Moderate large-caliber vessel that courses down around the apex.  It gives rise to 3 walk caliber diagonal branches.  There are only minimal luminal irregularities throughout the entire system the  Left Circumflex: This moderate caliber vessel with proximal diffuse mildly calcified 30% stenoses prior to entering the AV groove.  Once in the AV groove it gives rise to a very small first OM1 and the AV groove circumflex before terminating as a lateral OM2  that gives off a small OM2.  Otherwise Minimal luminal irregularities  Ramus intermedius: Moderate caliber vessel coursing along the anterior lateral wall bifurcates into 2 small branches distally, reaches the apex.   RCA: Large-caliber dominant vessel.  After a substantial RV marginal marginal branch in the mid vessel, there is diffuse 30-50% calcified stenoses before the vessel bifurcates distally into the Right Posterior Descending Artery and the Right Posterior AV Groove vessel.  Right PDA: Moderate caliber vessel that bifurcates in the tandem system.  The more medial branch has a much more robust branching system that actually provides some posterior lateral branches.  Mild luminal irregularities.  Right Posterior AV Groove - Posterolateral System: Moderate caliber vessel that terminates as 2 small caliber right posterior lateral branches.  Angiographically normal  PATIENT  DISPOSITION:    The patient was transferred to the PACU holding area in a hemodynamicaly stable, chest pain free condition.  The patient tolerated the procedure well, and there were no complications.  The patient was stable before, during, and after the procedure.  POST-OPERATIVE DIAGNOSIS:    Echocardiographically Severe Aortic Stenosis, however catheterization data does not support the level severity without area of 0.86 cm and peak/mean gradient of 30/25 mmHg.    Mild to moderate calcified RCA circumflex proximal disease but no obstructive coronary disease.  Marked bradycardia with heart rates in the 40-44 beats a minute range, likely achieving to the moderate to severely reduced cardiac output and index are consistent by both Fick and thermodilution.  PLAN OF CARE:  Due to borderline renal function, will hydrate post catheter 5 hours.  Otherwise standard post radial cath care with discharge but does have been.  He will followup with Dr. Tyrone Sage as scheduled tomorrow.  I notified Dr. Alanda Amass of the  results and will board the study report to both doctors Tyrone Sage and Alanda Amass.  I recommended the patient discontinue Bystolic due to his significant bradycardia.  The patient and his wife were updated.  Marykay Lex, M.D., M.S. [facility] [address] [place], [place]  [address]  [phone]  07/02/2012 4:44 PM

## 2013-01-28 NOTE — H&P (Signed)
Chief Complaint: R/LHC for consideration for AVR  HPI: The patient is a 78 y/o male patient of Dr. Alanda Amass, who has been followed for coronary artery disease and severe aortic valvular stenosis, which has been symptomatic, noting frequent fatigue and shortness of breath. No angina and no syncope/nearsyncope. His last echo was 12/18/12, which demonstrated normal systolic function with an EF of 55-60% and no WMA. Visualization of the aortic valve showed severely calcified tricuspid leaflets; fusion of the left-noncoronary commissure. Cusp separation was severely reduced. There was severe stenosis and mild regurgitation directed eccentrically in the LVOT. Valve area was measured at 0.73cm^2(VTI). Valve area: 0.73cm^2 (Vmax). Dr. Alanda Amass has referred him for a diagnostic right and left heart cath, to determine if the patient will be a suitable candidate for an AVR. From a vascular standpoint, he has had POBA of the circumflex and then repeat POBA 6 months later in 1993. His last catheterization in June 2011 showed noncritical coronary disease and at that time he had a 30-mm mean gradient across the aortic valve, 50% mid circumflex, no significant LAD and 50% right. He has not had any chest pain or nitroglycerin use. He has also had remote right SFA PTA in 1996, left common iliac PTA in 1996 and he has not had peripheral angiography or intervention since that time.  His other history is significant for bradycardia with underlying right bundle-branch block with right axis deviation, as well a history of tobacco abuse, smoking on average 1/2ppd. He denies any recent fevers, chills, n/v or abnormal bleeding.    Past Medical History  Diagnosis Date  . Hypertension   . GERD (gastroesophageal reflux disease)   . Coronary artery disease   . Heart murmur     per Dr. Alanda Amass 2012  . Arthritis   . Peripheral vascular disease     Ultrasound of legs 07/12/2011  . Trigger ring finger of left hand   . Olecranon  bursitis of left elbow     Past Surgical History  Procedure Laterality Date  . Coronary angioplasty      june 2011  . Tonsillectomy      as child  . Right hand surgery    . Trigger finger release  07/23/2011    Procedure: RELEASE TRIGGER FINGER/A-1 PULLEY;  Surgeon: Nilda Simmer, MD;  Location: [facility];  Service: Orthopedics;  Laterality: Left;  release trigger left ring finger patient had supraclavicular block in preop  . Olecranon bursectomy  07/23/2011    Procedure: OLECRANON BURSA;  Surgeon: Nilda Simmer, MD;  Location: [facility];  Service: Orthopedics;  Laterality: Left;  excision olecranon bursa left elbow patient had supraclaviular block in preop    Family History  Problem Relation Age of Onset  . Hypertension Mother    Social History:  reports that he has been smoking.  He does not have any smokeless tobacco history on file. He reports that he does not drink alcohol or use illicit drugs.  Allergies: No Known Allergies  Medications Prior to Admission  Medication Sig Dispense Refill  . amLODipine (NORVASC) 5 MG tablet Take 5 mg by mouth daily.      [name] aspirin EC 81 MG tablet Take 81 mg by mouth daily.      [name] atorvastatin (LIPITOR) 40 MG tablet Take 40 mg by mouth daily.      [name] ezetimibe (ZETIA) 10 MG tablet Take 10 mg by mouth daily.      . fish oil-omega-3 fatty  acids 1000 MG capsule Take 1 g by mouth daily.      [name] lisinopril (PRINIVIL,ZESTRIL) 20 MG tablet Take 20 mg by mouth at bedtime.       . multivitamin (THERAGRAN) per tablet Take 1 tablet by mouth daily.      . nebivolol (BYSTOLIC) 2.5 MG tablet Take 1.25 mg by mouth every other day. 1/2 a tablet every other day      . omeprazole (PRILOSEC) 20 MG capsule Take 20 mg by mouth daily.      . vitamin E 1000 UNIT capsule Take 1,000 Units by mouth daily.        No results found for this or any previous visit (from the past 48 hour(s)). Dg Chest 2 View  01/28/2013   *RADIOLOGY REPORT*   Clinical Data: Pre heart catheterization, history hypertension, coronary artery disease, peripheral vascular disease, smoking  CHEST - 2 VIEW  Comparison: 11/10/2009  Findings: Enlargement of cardiac silhouette. Calcified tortuous aorta. Mediastinal contours and pulmonary vascularity otherwise normal. Lungs hyperinflated with minimal peribronchial thickening. No pulmonary infiltrate, pleural effusion or pneumothorax. Bones demineralized.  IMPRESSION: Enlargement of cardiac silhouette. Question COPD.   Original Report Authenticated By: Ulyses Southward, M.D.    Review of Systems  Constitutional: Positive for malaise/fatigue.  Respiratory: Positive for shortness of breath.   Cardiovascular: Negative for chest pain.  Neurological: Negative for loss of consciousness.  All other systems reviewed and are negative.    Blood pressure 140/58, pulse 44, temperature 98.1 F (36.7 C), temperature source Oral, resp. rate 18, height 5\' 5"  (1.651 m), weight 135 lb (61.236 kg), SpO2 95.00%. Physical Exam  Constitutional: He is oriented to person, place, and time. He appears well-developed and well-nourished. No distress.  HENT:  Head: Normocephalic and atraumatic.  Eyes: Conjunctivae and EOM are normal. Pupils are equal, round, and reactive to light.  Neck: No JVD present. Carotid bruit is not present.  Cardiovascular: Regular rhythm and intact distal pulses.  Bradycardia present.  Exam reveals no gallop and no friction rub.   Murmur (3/6 SM best heard at LUSB) heard. Pulses:      Radial pulses are 2+ on the right side, and 2+ on the left side.       Dorsalis pedis pulses are 2+ on the right side, and 2+ on the left side.  Respiratory: Effort normal and breath sounds normal. No respiratory distress. He has no wheezes. He has no rales.  GI: Soft. Bowel sounds are normal. He exhibits no distension and no mass. There is no tenderness.  Musculoskeletal: He exhibits no edema.  Neurological: He is alert and  oriented to person, place, and time.  Skin: Skin is warm and dry. He is not diaphoretic.  Psychiatric: He has a normal mood and affect. His behavior is normal.     Assessment/Plan Principal Problem:   Aortic stenosis, severe Active Problems:   Hypertension   Coronary artery disease  Plan:  78 y/o male with severe and mildly symptomatic aortic stenosis, presents for planned right and left angiography to evaluate cardiovascular status to determine if he would be a suitable candidate for possible AVR. Renal function is stable with a SCr of 1.20. BP is stable. EKG demonstrates sinus bradycardia with a ventricular rate of 44bpm and RBBB, but no acute abnormalities. INR is WNL. Plan for the procedure this AM with Dr. Herbie Baltimore. Pt will likely need hydration with IVFs post cath, since baseline SCr is close to upper limits of normal. Will notify Dr.  Alanda Amass of the results. Pt also schedule to see Tyrone Sage tomorrow.   Allayne Butcher, PA-C 01/28/2013, 8:26 AM  I have seen and evaluated the patient this AM along with Boyce Medici, PA. I agree with her findings, examination as well as impression recommendations.  78 y/o man - pt of Dr. Alanda Amass referred for R&LHC as pre-op for AVR.  Main Sx is fatigue.  Recent Echo with Severe stenosis (Peak/Mean Gradients 80/36 mmHg).  He was recently seen in consultation by Dr. Tyrone Sage for pre-AVR evaluation.    I discussed the patient with Dr. Alanda Amass this AM -- if easily crossed, will cross AoV, if not, would not need to cross as Echo data is sufficient.  Performing MD:  Marykay Lex, M.D., M.S.  Procedure:  LEFT & RIGHT HEART CATHETERIZATION WITH NATIVE CORONARY ANGIOGRAPHY  The procedure with Risks/Benefits/Alternatives and Indications was reviewed with the patient and wife.  All questions were answered.    Risks / Complications include, but not limited to: Death, MI, CVA/TIA, VF/VT (with defibrillation), Bradycardia (need for temporary pacer  placement), contrast induced nephropathy, bleeding / bruising / hematoma / pseudoaneurysm, vascular or coronary injury (with possible emergent CT or Vascular Surgery), adverse medication reactions, infection.  Increased risk of CVA & sudden cardiac arrest in severe AS patients reviewed.  The patient  and family) voice understanding and agree to proceed.    MD Time with pt: 15 min  HARDING,DAVID W, M.D., M.S. [facility] [address] [place], [place]  [address]  [phone] Pager # [phone] 01/28/2013 8:45 AM

## 2013-01-28 NOTE — Progress Notes (Signed)
PER DR HARDING OK TO D/C HOME NOW

## 2013-01-28 NOTE — Interval H&P Note (Signed)
History and Physical Interval Note:  01/28/2013 8:50 AM  Ralph Drake  has presented today for surgery, with the diagnosis of Severe Aortic Stenosis: The various methods of treatment have been discussed with the patient and family. After consideration of risks, benefits and other options for treatment, the patient has consented to  Procedure(s): LEFT AND RIGHT HEART CATHETERIZATION WITH CORONARY ANGIOGRAM (N/A) as a surgical intervention .  The patient's history has been reviewed, patient examined, no change in status, stable for surgery.  I have reviewed the patient's chart and labs.  Questions were answered to the patient's satisfaction.     HARDING,DAVID W

## 2013-01-29 ENCOUNTER — Encounter: Payer: Self-pay | Admitting: Cardiothoracic Surgery

## 2013-01-29 ENCOUNTER — Ambulatory Visit (INDEPENDENT_AMBULATORY_CARE_PROVIDER_SITE_OTHER): Payer: Medicare Other | Admitting: Cardiothoracic Surgery

## 2013-01-29 VITALS — BP 143/76 | HR 53 | Resp 16 | Ht 65.0 in | Wt 135.0 lb

## 2013-01-29 DIAGNOSIS — I10 Essential (primary) hypertension: Secondary | ICD-10-CM

## 2013-01-29 DIAGNOSIS — I2510 Atherosclerotic heart disease of native coronary artery without angina pectoris: Secondary | ICD-10-CM

## 2013-01-29 DIAGNOSIS — I350 Nonrheumatic aortic (valve) stenosis: Secondary | ICD-10-CM

## 2013-01-29 DIAGNOSIS — I359 Nonrheumatic aortic valve disorder, unspecified: Secondary | ICD-10-CM

## 2013-01-29 DIAGNOSIS — R011 Cardiac murmur, unspecified: Secondary | ICD-10-CM

## 2013-01-29 NOTE — Patient Instructions (Signed)
Plan Aortic Valve Replacement September 2 Stop lisinopril after Saturday night dose Aug 30 No extra advil, or other antiinflammatory meds

## 2013-01-29 NOTE — Progress Notes (Signed)
[address]       [place] [address]             [phone]                        [facility] Medical Record #[id number] Date of Birth: Aug 18, 1934  Referring: Ralph Rooks, MD Primary Care: Ralph Dials, MD  Chief Complaint:    Chief Complaint  Patient presents with  . Aortic Stenosis    f/u after heart cath on 01/28/13    History of Present Illness:    Patient is a 78 year old male with known aortic stenosis. He has been followed with serial echocardiograms. He has known about the aortic stenosis for 3-4 years. He now notes increasing fatigue with exertion shortness of breath with exertion, he denies resting shortness of breath orthopnea syncope presyncope or angina. Because of worsening degree of aortic stenosis and symptoms he is referred by Dr. Alanda Drake for consideration of aortic valve replacement. The patient has known coronary occlusive disease having had a angioplasty in 1993 and stents placed in June of 2011. He has known peripheral vascular disease. He is a current smoker.   Patient returns today to further discuss aortic valve replacement after cardiac catheterization was done yesterday.  Current Activity/ Functional Status:  Patient is independent with mobility/ambulation, transfers, ADL's, IADL's.  Zubrod Score: At the time of surgery this patient's most appropriate activity status/level should be described as: []  Normal activity, no symptoms [x]  Symptoms, fully ambulatory []  Symptoms, in bed less than or equal to 50% of the time []  Symptoms, in bed greater than 50% of the time but less than 100% []  Bedridden []  Moribund   Past Medical History  Diagnosis Date  . Hypertension   . GERD (gastroesophageal reflux disease)   . Coronary artery disease   . Heart murmur     per Dr. Alanda Drake 2012  . Arthritis   . Peripheral vascular disease     Ultrasound of legs 07/12/2011  . Trigger ring finger of left  hand   . Olecranon bursitis of left elbow     Past Surgical History  Procedure Laterality Date  . Coronary angioplasty      june 2011  . Tonsillectomy      as child  . Right hand surgery    . Trigger finger release  07/23/2011    Procedure: RELEASE TRIGGER FINGER/A-1 PULLEY;  Surgeon: Ralph Simmer, MD;  Location: [facility];  Service: Orthopedics;  Laterality: Left;  release trigger left ring finger patient had supraclavicular block in preop  . Olecranon bursectomy  07/23/2011    Procedure: OLECRANON BURSA;  Surgeon: Ralph Simmer, MD;  Location: [facility];  Service: Orthopedics;  Laterality: Left;  excision olecranon bursa left elbow patient had supraclaviular block in preop    Family History  Problem Relation Age of Onset  . Hypertension Mother     History   Social History  . Marital Status: Married    Spouse Name: N/A    Number of Children: N/A  . Years of Education: N/A   Occupational History  . Not on file.   Social History Main Topics  . Smoking status: Current Every Day Smoker -- 0.50 packs/day  . Smokeless tobacco: Not on file  . Alcohol Use: No  . Drug Use: No  . Sexual Activity:  Other Topics Concern  . Not on file   Social History Narrative  . No narrative on file    History  Smoking status  . Current Every Day Smoker -- 0.50 packs/day  Smokeless tobacco  . Not on file    History  Alcohol Use No     No Known Allergies  Current Outpatient Prescriptions  Medication Sig Dispense Refill  . amLODipine (NORVASC) 5 MG tablet Take 5 mg by mouth daily.      [name] aspirin EC 81 MG tablet Take 81 mg by mouth daily.      [name] atorvastatin (LIPITOR) 40 MG tablet Take 40 mg by mouth daily.      [name] ezetimibe (ZETIA) 10 MG tablet Take 10 mg by mouth daily.      . fish oil-omega-3 fatty acids 1000 MG capsule Take 1 g by mouth daily.      [name] lisinopril (PRINIVIL,ZESTRIL) 20 MG tablet Take 20 mg by mouth at bedtime.       .  multivitamin (THERAGRAN) per tablet Take 1 tablet by mouth daily.      [name] omeprazole (PRILOSEC) 20 MG capsule Take 20 mg by mouth daily.      . vitamin E 1000 UNIT capsule Take 1,000 Units by mouth daily.       No current facility-administered medications for this visit.       Review of Systems:     Cardiac Review of Systems: Y or N  Chest Pain [ n   ]  Resting SOB [ n  ] Exertional SOB  [ y ]  Ralph Drake.Brash  ]   Pedal Edema [  n ]    Palpitations [ n ] Syncope  [ n ]   Presyncope [n   ]  General Review of Systems: [Y] = yes [  ]=no Constitional: recent weight change [  ]; anorexia [  ]; fatigue [ y ]; nausea [  n]; night sweats [ n]; fever [ n ]; or chills [  n];                                                                                                                                          Dental: poor dentition[  ]; Last Dentist visit:   Eye : blurred vision [  ]; diplopia [   ]; vision changes [  ];  Amaurosis fugax[  ]; Resp: cough [ n ];  wheezing[ n ];  hemoptysis[ n ]; shortness of breath[  ]; paroxysmal nocturnal dyspnea[ n ]; dyspnea on exertion[ y ]; or orthopnea[n  ];  GI:  gallstones[  ], vomiting[  ];  dysphagia[  ]; melena[n  ];  hematochezia [ n ]; heartburn[  n];   Hx of  Colonoscopy[y  ]; GU: kidney stones [  ]; hematuria[  ];   dysuria [  ];  nocturia[  ];  history of     obstruction [  ]; urinary frequency [n  ]             Skin: rash, swelling[  ];, hair loss[  ];  peripheral edema[  ];  or itching[  ]; Musculosketetal: myalgias[  ];  joint swelling[  ];  joint erythema[  ];  joint pain[  ];  back pain[  ];  Heme/Lymph: bruising[ n ];  bleeding[ n ];  anemia[  ];  Neuro: TIA[  ];  headaches[  ];  stroke[  ];  vertigo[  ];  seizures[ n ];   paresthesias[  ];  difficulty walking[ n ];  Psych:depression[  ]; anxiety[  ];  Endocrine: diabetes[ n ];  thyroid dysfunction[n  ];  Immunizations: Flu [ y ]; Pneumococcal[ y ];  Other:  Physical Exam: BP 143/76  Pulse  53  Resp 16  Ht 5\' 5"  (1.651 m)  Wt 135 lb (61.236 kg)  BMI 22.47 kg/m2  SpO2 93%  General appearance: alert, cooperative and appears stated age Neurologic: intact Heart: regular rate and rhythm and systolic murmur: holosystolic 3/6, crescendo throughout the precordium Lungs: clear to auscultation bilaterally and normal percussion bilaterally Abdomen: soft, non-tender; bowel sounds normal; no masses,  no organomegaly Extremities: extremities normal, atraumatic, no cyanosis or edema and Homans sign is negative, no sign of DVT Patient has no carotid bruits, he has no cervical or supraclavicular or axillary adenopathy Has +2 femoral pulses bilaterally 1+ DP and PT pulses bilaterally. Appears to have adequate vein for coronary bypass grafting in both lower extremities  Diagnostic Studies & Laboratory data:     Recent Radiology Findings:  Dg Chest 2 View  01/28/2013   *RADIOLOGY REPORT*  Clinical Data: Pre heart catheterization, history hypertension, coronary artery disease, peripheral vascular disease, smoking  CHEST - 2 VIEW  Comparison: 11/10/2009  Findings: Enlargement of cardiac silhouette. Calcified tortuous aorta. Mediastinal contours and pulmonary vascularity otherwise normal. Lungs hyperinflated with minimal peribronchial thickening. No pulmonary infiltrate, pleural effusion or pneumothorax. Bones demineralized.  IMPRESSION: Enlargement of cardiac silhouette. Question COPD.   Original Report Authenticated By: Ulyses Southward, M.D.    Recent Lab Findings: Lab Results  Component Value Date   WBC 7.5 11/28/2012   HGB 13.9 11/28/2012   HCT 40.6 11/28/2012   PLT 212 11/28/2012   GLUCOSE 99 11/28/2012   CHOL 156 11/28/2012   TRIG 54 11/28/2012   HDL 53 11/28/2012   LDLCALC 92 11/28/2012   ALT 16 11/28/2012   AST 21 11/28/2012   NA 140 11/28/2012   K 5.2 11/28/2012   CL 107 11/28/2012   CREATININE 1.29 11/28/2012   BUN 28* 11/28/2012   CO2 27 11/28/2012   TSH 2.785 Test methodology is 3rd  generation TSH 11/10/2009   INR 0.89 11/10/2009   HGBA1C  Value: 6.1 (NOTE)                                                                       According to the ADA Clinical Practice Recommendations for 2011, when HbA1c is used as a screening test:   >=6.5%   Diagnostic of Diabetes Mellitus           (if abnormal result  is  confirmed)  5.7-6.4%   Increased risk of developing Diabetes Mellitus  References:Diagnosis and Classification of Diabetes Mellitus,Diabetes Care,2011,34(Suppl 1):S62-S69 and Standards of Medical Care in         Diabetes - 2011,Diabetes Care,2011,34  (Suppl 1):S11-S61.* 11/10/2009   ECHO: Patient: Ralph, Drake MR #: [id number] Study Date: 12/18/2012 Gender: M Age: 69 Height: 165.1cm Weight: 59kg BSA: 1.11m^2 Pt. Status: Room:  Cheron Schaumann, MD [facility] PERFORMING Susa Griffins, MD [facility] REFERRING Susa Griffins, MD [facility] ATTENDING Nanetta Batty, MD SONOGRAPHER Metro Kung, RDCS cc:  ------------------------------------------------------------ LV EF: 55% - 60%  ------------------------------------------------------------ Indications: Aortic stenosis 424.1.  ------------------------------------------------------------ History: PMH: Coronary artery disease. Aortic valve disease. Risk factors: RBBB, bradycardia Current tobacco use. Dyslipidemia.  ------------------------------------------------------------ Study Conclusions  - Left ventricle: The cavity size was mildly reduced. There was moderate concentric hypertrophy. Systolic function was normal. The estimated ejection fraction was in the range of 55% to 60%. Wall motion was normal; there were no regional wall motion abnormalities. Doppler parameters are consistent with abnormal left ventricular relaxation (grade 1 diastolic dysfunction). - Aortic valve: Trileaflet; severely calcified leaflets; fusion of the left-noncoronary commissure. Cusp separation was severely reduced. There was  severe stenosis. Mild regurgitation directed eccentrically in the LVOT. Valve area: 0.73cm^2(VTI). Valve area: 0.73cm^2 (Vmax). - Mitral valve: Calcified annulus. Moderately calcified leaflets posterior. Mild regurgitation. Valve area by continuity equation (using LVOT flow): 1.93cm^2. - Left atrium: The atrium was mildly to moderately dilated. - Right ventricle: Systolic pressure was increased. - Atrial septum: No defect or patent foramen ovale was identified. - Pulmonary arteries: PA peak pressure: 31mm Hg (S). Impressions:  - severe-critical calcific AS with some leaflet openi9ng seen. Concern because of extent of calcification, small LV cavity with moderately -severe LVH. Transthoracic echocardiography. M-mode, complete 2D, spectral Doppler, and color Doppler. Height: Height: 165.1cm. Height: 65in. Weight: Weight: 59kg. Weight: 129.7lb. Body mass index: BMI: 21.6kg/m^2. Body surface area: BSA: 1.27m^2. Blood pressure: 110/70. Patient status: Outpatient. Location: Echo laboratory.  ------------------------------------------------------------  ------------------------------------------------------------ Left ventricle: The cavity size was mildly reduced. There was moderate-severe concentric hypertrophy. Systolic function was normal. The estimated ejection fraction was in the range of 55% to 60%. Wall motion was normal; there were no regional wall motion abnormalities. Doppler parameters are consistent with abnormal left ventricular relaxation (grade 1 diastolic dysfunction).  ------------------------------------------------------------ Aortic valve: Trileaflet; severely calcified leaflets; fusion of the left-noncoronary commissure. Cusp separation was severely reduced. Doppler: There was severe stenosis. Mild regurgitation directed eccentrically in the LVOT. Valve area: 0.73cm^2(VTI). Indexed valve area: 0.44cm^2/m^2 (VTI). Valve area: 0.73cm^2 (Vmax). Indexed valve  area: 0.44cm^2/m^2 (Vmax). Mean gradient: 36mm Hg (S). Peak gradient: 80mm Hg (S).  ------------------------------------------------------------ Aorta: The aorta was normal, not dilated, and non-diseased.  ------------------------------------------------------------ Mitral valve: Calcified annulus. Moderately calcified leaflets posterior. No echocardiographic evidence for prolapse. Doppler: There was no evidence for stenosis. Mild regurgitation. Valve area by pressure half-time: 2.62cm^2. Indexed valve area by pressure half-time: 1.59cm^2/m^2. Valve area by continuity equation (using LVOT flow): 1.93cm^2. Indexed valve area by continuity equation (using LVOT flow): 1.17cm^2/m^2. Mean gradient: 1mm Hg (D).  ------------------------------------------------------------ Left atrium: LA Volume/BSA 39.4 ml/m2. The atrium was mildly to moderately dilated.  ------------------------------------------------------------ Atrial septum: No defect or patent foramen ovale was identified.  ------------------------------------------------------------ Right ventricle: The cavity size was normal. Wall thickness was normal. Systolic function was normal. Systolic pressure was increased.  ------------------------------------------------------------ Pulmonic valve: Doppler: Trivial regurgitation.  ------------------------------------------------------------ Right atrium: The atrium was at the upper limits of normal in size.  ------------------------------------------------------------ Pericardium: The  pericardium was normal in appearance. There was no pericardial effusion.  ------------------------------------------------------------ Systemic veins: Inferior vena cava: The vessel was normal in size; the respirophasic diameter changes were in the normal range (= 50%); findings are consistent with normal central  venous pressure.  ------------------------------------------------------------ Post procedure conclusions Ascending Aorta:  - The aorta was normal, not dilated, and non-diseased.  ------------------------------------------------------------  2D measurements Normal Doppler measurements Normal Left ventricle Main pulmonary LVID ED, 41.8 mm 43-52 artery chord, Pressure, 31 mm Hg =30 PLAX S LVID ES, 21.8 mm 23-38 Left ventricle chord, Ea, lat 5.7 cm/s ------ PLAX ann, tiss FS, chord, 48 % >29 DP PLAX E/Ea, lat 11.1 ------ LVPW, ED 16.6 mm ------ ann, tiss 4 IVS/LVPW 0.99 <1.3 DP ratio, ED Ea, med 4.8 cm/s ------ Ventricular septum ann, tiss IVS, ED 16.4 mm ------ DP LVOT E/Ea, med 13.2 ------ Diam, S 20 mm ------ ann, tiss 3 Area 3.14 cm^2 ------ DP Aorta Aortic valve Root diam, 37 mm ------ Peak vel, 430 cm/s ------ ED S Left atrium Mean vel, 273 cm/s ------ AP dim 49 mm ------ S AP dim 2.97 cm/m^2 <2.2 VTI, S 123 cm ------ index Mean 36 mm Hg ------ gradient, S Peak 80 mm Hg ------ gradient, S Area, VTI 0.73 cm^2 ------ Area index 0.44 cm^2/m ------ (VTI) ^2 Area, Vmax 0.73 cm^2 ------ Area index 0.44 cm^2/m ------ (Vmax) ^2 Regurg PHT 776 ms ------ Mitral valve Peak E vel 63.5 cm/s ------ Peak A vel 79.9 cm/s ------ Mean vel, 46.4 cm/s ------ D Decelerati 406 ms 150-23 on time 0 Pressure 84 ms ------ half-time Mean 1 mm Hg ------ gradient, D Peak E/A 0.8 ------ ratio Area (PHT) 2.62 cm^2 ------ Area index 1.59 cm^2/m ------ (PHT) ^2 Area 1.93 cm^2 ------ (LVOT) continuity Area index 1.17 cm^2/m ------ (LVOT ^2 cont) Annulus 50 cm ------ VTI Tricuspid valve Regurg 253 cm/s ------ peak vel Peak RV-RA 26 mm Hg ------ gradient, S Systemic veins Estimated 5 mm Hg ------ CVP Right ventricle Pressure, 31 mm Hg <30 S Sa vel, 12.7 cm/s ------ lat ann, tiss DP  ------------------------------------------------------------ Prepared and  Electronically Authenticated by  Susa Griffins, MD [facility] 2014-07-10T15:03:[id number]  CaINTERVENTIONAL CARDIOLOGIST: Marykay Lex, M.D., MS  PRIMARY CARE PROVIDER: Aura Dials, MD  PRIMARY CARDIOLOGIST: Susa Griffins, MD  PATIENT: Ralph Drake is a 78 y.o. male with history of known coronary disease and severe aortic valve stenosis by recent echocardiography. Ejection fraction was estimated 55-60% no regional wall motion abnormalities. His valve area was estimated to be 0.73cm2, Peak/Mean Gradients 80/36 mmHg.  He was seen by Dr. Tyrone Sage from CT surgery for pre-operative evaluation for aortic valve replacement, and is referred now for preop right and left heart catheterization.  PRE-OPERATIVE DIAGNOSIS:  Severe Aortic Stenosis  History of CAD s/p PTCA PROCEDURES PERFORMED:  Right & Left Heart Catheterization with Native Coronary Angiography via Right Radial Artery & Right Internal Jugular Vein Access.  Left Ventriculography Consent: The procedure with Risks/Benefits/Alternatives and Indications was reviewed with the patient (and family). All questions were answered.  PROCEDURE:  The patient was brought to the 2nd Floor [facility] in the fasting state and prepped and draped in the usual sterile fashion for Right groin or radial / brachial access. A modified Allen's test with plethysmography was performed on the right wrist demonstrating adequate Ulnar Artery collateral flow. Using sterile technique, a 18 gauge IV was inserted in the Right antecubital vein and capped by the cath lab RN.  Sterile technique was used including antiseptics, cap, gloves, gown, hand hygiene, mask and sheet. Skin prep: Chlorhexidine;  Time Out: Verified patient identification, verified procedure, site/side was marked, verified correct patient position, special equipment/implants available, medications/allergies/relevent history reviewed, required imaging and test results  available. Performed  Right Heart Catheterization  The Right Neck visualized using direct Sonosite [place] to identify the Right Internal Jugular Vein (RIJV) Right Carotid Artery - images were captured. The site was anesthetized using ~44ml lidocaine. The RIJV was accessed under direct US guidance using the modified Seldinger technique, and a 7Fr sheath was inserted over the wire.  Then a 7 Fr Theone Murdoch Catheter was advanced through the sheath, and with the balloon inflated once in the SVC, was advanced under fluoroscopic guidance into the first the Right Atrium, then through the Right Ventricle into the Main Pulmonary Artery and into the Wedge position. Hemodynamics measurements were obtained in each location. Simultaneous Oxygen saturation were recorded in both the Pulmonary Artery and the Central Aorta. Simultaneous pressure measurements were obtained in the PA/PCWP and RV along with LV. Thermodilution injections were then performed to calculate Cardiac Output by Fick Calculation..  Then the catheter was removed completely out of the body with the balloon deflated. The Sheath was flushed with heparinized saline.  Left Heart Catheterization  The right wrist was anesthetized with 1% subcutaneous Lidocaine. The right radial artery was accessed using the Seldinger Technique with placement of a 5 Fr Glide Sheath. The sheath was aspirated and flushed. Then a total of 10 ml of standard Radial Artery Cocktail (see medications) was infused. Radial Cocktail: 5 mg Verapamil, 400 mcg NTG, 2 ml 2% Lidocaine in 10 ml NS. 5000 Units of IV Heparin was administered once the catheter reached the ascending aorta.  A 5 Fr TIG Catheter was advanced of over a Versicore wire into the ascending Aorta. The catheter was used to engage the Left and Right coronary artery. Multiple cineangiographic views of the Both coronary artery system(s) were performed.  The catheter was exchanged for a Gibson Ramp followed by a JR4 catheter that was  finally used to cross the Aortic Valve. LV hemodynamics were measured and Left Ventriculography was performed using Hand injection. LV hemodynamics were then re-sampled, and the catheter was pulled back across the Aortic Valve for measurement of "pull-back" gradient.  Both sheaths were removed in the Cath Lab with a TR band placed at 37 ml Air at 1120 hours. Reverse Allen's test revealed non-occlusive hemostasis and manual pressure held for the venous access.  MEDICATIONS:  ANESTHESIA: Local Lidocaine 6 ml  SEDATION: 1 mg IV Versed, 25 mcg IV fentanyl ; Premedication: 5 mg oral Valium  Omnipaque contrast 75 mL  EBL: < 10ml, not including ABG and VBG samples  FINDINGS:  Hemodynamics:  Findings:   SaO2%  Pressures mmHg  Mean P  mmHg  EDP  mmHg  Peak to Peak  AoV area cm2  AoV index cm2 /m2   Right Atrium   6/5   3      Right Ventricle   31/3   7      Pulmonary Artery  49%  31/11  20       PCWP   9/9  9       Central Aortic  82% (on room air)  146/51  78       Left Ventricle   176/80   16      Aortic valve gradient    24.9   30  0.86 - 0.87  0.52             Cardiac Output:   Cardiac Index:       Fick  3.39   2.03       Thermodilution  3.41   2.04       Coronary Anatomy:  Left Main: Large-caliber vessel that trifurcates into the LAD, Ramus Intermedius, and Circumflex LAD: Moderate large-caliber vessel that courses down around the apex. It gives rise to 3 walk caliber diagonal branches. There are only minimal luminal irregularities throughout the entire system the  Left Circumflex: This moderate caliber vessel with proximal diffuse mildly calcified 30% stenoses prior to entering the AV groove. Once in the AV groove it gives rise to a very small first OM1 and the AV groove circumflex before terminating as a lateral OM2 that gives off a small OM2. Otherwise Minimal luminal irregularities  Ramus intermedius: Moderate caliber vessel coursing along the anterior lateral wall bifurcates into 2 small  branches distally, reaches the apex. RCA: Large-caliber dominant vessel. After a substantial RV marginal marginal branch in the mid vessel, there is diffuse 30-50% calcified stenoses before the vessel bifurcates distally into the Right Posterior Descending Artery and the Right Posterior AV Groove vessel.  Right PDA: Moderate caliber vessel that bifurcates in the tandem system. The more medial branch has a much more robust branching system that actually provides some posterior lateral branches. Mild luminal irregularities.  Right Posterior AV Groove - Posterolateral System: Moderate caliber vessel that terminates as 2 small caliber right posterior lateral branches. Angiographically normal PATIENT DISPOSITION:  The patient was transferred to the PACU holding area in a hemodynamicaly stable, chest pain free condition.  The patient tolerated the procedure well, and there were no complications.  The patient was stable before, during, and after the procedure. POST-OPERATIVE DIAGNOSIS:  Echocardiographically Severe Aortic Stenosis, however catheterization data does not support the level severity without area of 0.86 cm and peak/mean gradient of 30/25 mmHg.  Mild to moderate calcified RCA circumflex proximal disease but no obstructive coronary disease.  Marked bradycardia with heart rates in the 40-44 beats a minute range, likely achieving to the moderate to severely reduced cardiac output and index are consistent by both Fick and thermodilution. PLAN OF CARE:  Due to borderline renal function, will hydrate post catheter 5 hours. Otherwise standard post radial cath care with discharge but does have been.  He will followup with Dr. Tyrone Sage as scheduled tomorrow. I notified Dr. Alanda Drake of the results and will board the study report to both doctors Tyrone Sage and Alanda Drake.  I recommended the patient discontinue Bystolic due to his significant bradycardia. The patient and his wife were updated.   Marykay Lex, M.D., M.S.  [facility]  [address]  [place], [place] [address]  [phone]  07/02/2012  4:44 PM rdiac Cath:  Assessment / Plan:   Patient with known previous angioplasty coronary occlusive disease now presents with symptomatic critical aortic stenosis with a aortic valve velocity of 4.5 cm/s, Peak grad 80 mean  grad  36 AVA .73 Cardiac cath has been done  I have reviewed the patient's echocardiograms, history,  cath films and reviewing with he and his wife the diagnosis of critical aortic stenosis the options of treatment have recommended to him proceeding with aortic valve replacement Sept 2 . We discussed the use of a tissue valve to avoid long-term Coumadin. The goals risks and alternatives of the planned surgical procedure  AVR   have been discussed with the patient in detail. The risks of the procedure including death, infection, stroke, myocardial infarction, bleeding, blood transfusion and heart block  have all been discussed specifically.  I have quoted Ralph Drake a 4% of perioperative mortality and a complication rate as high as 25 %. The patient's questions have been answered.Ralph Drake is willing  to proceed with the planned procedure.  Delight Ovens MD      [address] [place] [place] [address] Office [phone]   Beeper [phone]  01/29/2013 4:12 PM

## 2013-01-30 ENCOUNTER — Other Ambulatory Visit: Payer: Self-pay | Admitting: *Deleted

## 2013-01-30 DIAGNOSIS — I359 Nonrheumatic aortic valve disorder, unspecified: Secondary | ICD-10-CM

## 2013-02-03 ENCOUNTER — Encounter (HOSPITAL_COMMUNITY): Payer: Self-pay | Admitting: Pharmacy Technician

## 2013-02-06 ENCOUNTER — Encounter (HOSPITAL_COMMUNITY): Payer: Self-pay

## 2013-02-06 ENCOUNTER — Ambulatory Visit (HOSPITAL_COMMUNITY)
Admission: RE | Admit: 2013-02-06 | Discharge: 2013-02-06 | Disposition: A | Discharge status: Home / Self Care | Payer: Medicare Other | Source: Ambulatory Visit | Attending: Cardiothoracic Surgery | Admitting: Cardiothoracic Surgery

## 2013-02-06 ENCOUNTER — Inpatient Hospital Stay (HOSPITAL_COMMUNITY)
Admission: RE | Admit: 2013-02-06 | Discharge: 2013-02-06 | Disposition: A | Discharge status: Home / Self Care | Payer: Medicare Other | Source: Ambulatory Visit | Attending: Cardiothoracic Surgery | Admitting: Cardiothoracic Surgery

## 2013-02-06 ENCOUNTER — Encounter (HOSPITAL_COMMUNITY): Admission: RE | Admit: 2013-02-06 | Payer: Medicare Other | Source: Ambulatory Visit

## 2013-02-06 ENCOUNTER — Encounter (HOSPITAL_COMMUNITY)
Admission: RE | Admit: 2013-02-06 | Discharge: 2013-02-06 | Disposition: A | Discharge status: Home / Self Care | Payer: Medicare Other | Source: Ambulatory Visit | Attending: Cardiothoracic Surgery | Admitting: Cardiothoracic Surgery

## 2013-02-06 VITALS — BP 168/64 | HR 46 | Temp 97.8°F | Resp 14 | Ht 65.0 in | Wt 133.6 lb

## 2013-02-06 DIAGNOSIS — Z01810 Encounter for preprocedural cardiovascular examination: Secondary | ICD-10-CM | POA: Insufficient documentation

## 2013-02-06 DIAGNOSIS — R0989 Other specified symptoms and signs involving the circulatory and respiratory systems: Secondary | ICD-10-CM | POA: Insufficient documentation

## 2013-02-06 DIAGNOSIS — I359 Nonrheumatic aortic valve disorder, unspecified: Secondary | ICD-10-CM

## 2013-02-06 DIAGNOSIS — Z01812 Encounter for preprocedural laboratory examination: Secondary | ICD-10-CM | POA: Insufficient documentation

## 2013-02-06 DIAGNOSIS — I6529 Occlusion and stenosis of unspecified carotid artery: Secondary | ICD-10-CM | POA: Insufficient documentation

## 2013-02-06 DIAGNOSIS — I658 Occlusion and stenosis of other precerebral arteries: Secondary | ICD-10-CM | POA: Insufficient documentation

## 2013-02-06 DIAGNOSIS — R0609 Other forms of dyspnea: Secondary | ICD-10-CM | POA: Insufficient documentation

## 2013-02-06 HISTORY — DX: Shortness of breath: R06.02

## 2013-02-06 HISTORY — PX: OTHER SURGICAL HISTORY: SHX169

## 2013-02-06 LAB — COMPREHENSIVE METABOLIC PANEL
ALT: 20 U/L (ref 0–53)
AST: 26 U/L (ref 0–37)
Albumin: 4.2 g/dL (ref 3.5–5.2)
Alkaline Phosphatase: 76 U/L (ref 39–117)
BUN: 28 mg/dL — ABNORMAL HIGH (ref 6–23)
CO2: 26 mEq/L (ref 19–32)
Calcium: 10.4 mg/dL (ref 8.4–10.5)
Chloride: 101 mEq/L (ref 96–112)
Creatinine, Ser: 1.12 mg/dL (ref 0.50–1.35)
GFR calc Af Amer: 72 mL/min — ABNORMAL LOW (ref >90)
GFR calc non Af Amer: 62 mL/min — ABNORMAL LOW (ref >90)
Glucose, Bld: 100 mg/dL — ABNORMAL HIGH (ref 70–99)
Potassium: 4.6 mEq/L (ref 3.5–5.1)
Sodium: 137 mEq/L (ref 135–145)
Total Bilirubin: 0.2 mg/dL — ABNORMAL LOW (ref 0.3–1.2)
Total Protein: 7.5 g/dL (ref 6.0–8.3)

## 2013-02-06 LAB — CBC
HCT: 43 % (ref 39.0–52.0)
Hemoglobin: 14.7 g/dL (ref 13.0–17.0)
MCH: 31.9 pg (ref 26.0–34.0)
MCHC: 34.2 g/dL (ref 30.0–36.0)
MCV: 93.3 fL (ref 78.0–100.0)
Platelets: 180 10*3/uL (ref 150–400)
RBC: 4.61 MIL/uL (ref 4.22–5.81)
RDW: 14.2 % (ref 11.5–15.5)
WBC: 8.7 10*3/uL (ref 4.0–10.5)

## 2013-02-06 LAB — SURGICAL PCR SCREEN
MRSA, PCR: NEGATIVE
Staphylococcus aureus: NEGATIVE

## 2013-02-06 LAB — URINALYSIS, ROUTINE W REFLEX MICROSCOPIC
Bilirubin Urine: NEGATIVE
Glucose, UA: NEGATIVE mg/dL
Hgb urine dipstick: NEGATIVE
Ketones, ur: NEGATIVE mg/dL
Leukocytes, UA: NEGATIVE
Nitrite: NEGATIVE
Protein, ur: NEGATIVE mg/dL
Specific Gravity, Urine: 1.012 (ref 1.005–1.030)
Urobilinogen, UA: 0.2 mg/dL (ref 0.0–1.0)
pH: 6.5 (ref 5.0–8.0)

## 2013-02-06 LAB — BLOOD GAS, ARTERIAL
Acid-base deficit: 0.3 mmol/L (ref 0.0–2.0)
Bicarbonate: 24.1 mEq/L — ABNORMAL HIGH (ref 20.0–24.0)
Drawn by: 24485
O2 Saturation: 91.5 %
Patient temperature: 98.6
TCO2: 25.3 mmol/L (ref 0–100)
pCO2 arterial: 41.3 mmHg (ref 35.0–45.0)
pH, Arterial: 7.384 (ref 7.350–7.450)
pO2, Arterial: 59.5 mmHg — ABNORMAL LOW (ref 80.0–100.0)

## 2013-02-06 LAB — PULMONARY FUNCTION TEST

## 2013-02-06 LAB — PROTIME-INR
INR: 0.92 (ref 0.00–1.49)
Prothrombin Time: 12.2 seconds (ref 11.6–15.2)

## 2013-02-06 LAB — TYPE AND SCREEN
ABO/RH(D): O POS
Antibody Screen: NEGATIVE

## 2013-02-06 LAB — APTT: aPTT: 27 seconds (ref 24–37)

## 2013-02-06 LAB — HEMOGLOBIN A1C
Hgb A1c MFr Bld: 6.3 % — ABNORMAL HIGH (ref <5.7)
Mean Plasma Glucose: 134 mg/dL — ABNORMAL HIGH (ref <117)

## 2013-02-06 LAB — ABO/RH: ABO/RH(D): O POS

## 2013-02-06 MED ORDER — ALBUTEROL SULFATE (5 MG/ML) 0.5% IN NEBU
2.5000 mg | INHALATION_SOLUTION | Freq: Once | RESPIRATORY_TRACT | Status: AC
  Administered 2013-02-06: 2.5 mg via RESPIRATORY_TRACT
  Filled 2013-02-06: qty 0.5

## 2013-02-06 NOTE — Progress Notes (Signed)
VASCULAR LAB PRELIMINARY  PRELIMINARY  PRELIMINARY  PRELIMINARY  Pre-op Cardiac Surgery  Carotid Findings:  Bilateral:  1-39% ICA stenosis.  Vertebral artery flow is antegrade.      Upper Extremity Right Left  Brachial Pressures 151 triphasic 149 triphasic  Radial Waveforms triphasic triphasic  Ulnar Waveforms triphasic monophasic  Palmar Arch (Allen's Test) * **   Findings:  *Right:  Doppler waveforms remain normal with ulnar and radial compressions.  **Left:  Doppler waveforms obliterate with radial and remain normal with ulnar compressions.    Amna Welker, RVT 02/06/2013, 9:46 AM

## 2013-02-06 NOTE — Pre-Procedure Instructions (Signed)
Ralph Drake  02/06/2013   Your procedure is scheduled on: 02-10-2013  Tuesday   Report to [facility] at 5:30  AM .  Call this number if you have problems the morning of surgery: [phone]   Remember:   Do not eat food or drink liquids after midnight.    Take these medicines the morning of surgery with A SIP OF WATER: amlodipine,omeprazole   Do not wear jewelry  Do not wear lotions, powders, or perfumes.   Do not shave 48 hours prior to surgery. Men may shave face and neck.  Do not bring valuables to the hospital.  [facility] is not responsible for any belongings or valuables.  Contacts, dentures or bridgework may not be worn into surgery .  Leave suitcase in the car. After surgery it may be brought to your room.   For patients admitted to the hospital, checkout time is 11:00 AM the day of discharge.   Patients discharged the day of surgery will not be allowed to drive home.    Special Instructions: Shower using CHG 2 nights before surgery and the night before surgery.  If you shower the day of surgery use CHG.  Use special wash - you have one bottle of CHG for all showers.  You should use approximately 1/3 of the bottle for each shower.   Please read over the following fact sheets that you were given: Pain Booklet, Coughing and Deep Breathing, Blood Transfusion Information, Open Heart Packet and Surgical Site Infection Prevention

## 2013-02-09 MED ORDER — SODIUM CHLORIDE 0.9 % IV SOLN
INTRAVENOUS | Status: AC
  Administered 2013-02-10: 70 mL/h via INTRAVENOUS
  Administered 2013-02-10: 14 mL/h via INTRAVENOUS
  Filled 2013-02-09: qty 40

## 2013-02-09 MED ORDER — DOPAMINE-DEXTROSE 3.2-5 MG/ML-% IV SOLN
2.0000 ug/kg/min | INTRAVENOUS | Status: DC
  Filled 2013-02-09: qty 250

## 2013-02-09 MED ORDER — PHENYLEPHRINE HCL 10 MG/ML IJ SOLN
30.0000 ug/min | INTRAVENOUS | Status: AC
  Administered 2013-02-10: 5 ug/min via INTRAVENOUS
  Filled 2013-02-09: qty 2

## 2013-02-09 MED ORDER — DEXMEDETOMIDINE HCL IN NACL 400 MCG/100ML IV SOLN
0.1000 ug/kg/h | INTRAVENOUS | Status: AC
  Administered 2013-02-10: 0.2 ug/kg/h via INTRAVENOUS
  Filled 2013-02-09: qty 100

## 2013-02-09 MED ORDER — DEXTROSE 5 % IV SOLN
0.5000 ug/min | INTRAVENOUS | Status: DC
  Filled 2013-02-09: qty 4

## 2013-02-09 MED ORDER — PLASMA-LYTE 148 IV SOLN
INTRAVENOUS | Status: AC
  Administered 2013-02-10: 09:00:00
  Filled 2013-02-09: qty 2.5

## 2013-02-09 MED ORDER — NITROGLYCERIN IN D5W 200-5 MCG/ML-% IV SOLN
2.0000 ug/min | INTRAVENOUS | Status: AC
  Administered 2013-02-10: 16 ug/min via INTRAVENOUS
  Filled 2013-02-09: qty 250

## 2013-02-09 MED ORDER — SODIUM CHLORIDE 0.9 % IV SOLN
INTRAVENOUS | Status: AC
  Administered 2013-02-10: 1.9 [IU]/h via INTRAVENOUS
  Administered 2013-02-10: 1 [IU]/h via INTRAVENOUS
  Filled 2013-02-09: qty 1

## 2013-02-09 MED ORDER — POTASSIUM CHLORIDE 2 MEQ/ML IV SOLN
80.0000 meq | INTRAVENOUS | Status: DC
  Filled 2013-02-09: qty 40

## 2013-02-09 MED ORDER — DEXTROSE 5 % IV SOLN
1.5000 g | INTRAVENOUS | Status: AC
  Administered 2013-02-10: 1.5 g via INTRAVENOUS
  Administered 2013-02-10: .75 g via INTRAVENOUS
  Filled 2013-02-09: qty 1.5

## 2013-02-09 MED ORDER — MAGNESIUM SULFATE 50 % IJ SOLN
40.0000 meq | INTRAMUSCULAR | Status: DC
  Filled 2013-02-09: qty 10

## 2013-02-09 MED ORDER — VANCOMYCIN HCL 10 G IV SOLR
1250.0000 mg | INTRAVENOUS | Status: AC
  Administered 2013-02-10: 1250 mg via INTRAVENOUS
  Filled 2013-02-09 (×2): qty 1250

## 2013-02-09 MED ORDER — SODIUM CHLORIDE 0.9 % IV SOLN
INTRAVENOUS | Status: DC
  Filled 2013-02-09: qty 30

## 2013-02-09 MED ORDER — DEXTROSE 5 % IV SOLN
750.0000 mg | INTRAVENOUS | Status: DC
  Filled 2013-02-09: qty 750

## 2013-02-10 ENCOUNTER — Inpatient Hospital Stay (HOSPITAL_COMMUNITY): Payer: Medicare Other | Admitting: Certified Registered"

## 2013-02-10 ENCOUNTER — Inpatient Hospital Stay (HOSPITAL_COMMUNITY): Payer: Medicare Other

## 2013-02-10 ENCOUNTER — Inpatient Hospital Stay (HOSPITAL_COMMUNITY)
Admission: RE | Admit: 2013-02-10 | Discharge: 2013-02-17 | DRG: 220 | Disposition: A | Discharge status: Home / Self Care | Payer: Medicare Other | Source: Ambulatory Visit | Attending: Cardiothoracic Surgery | Admitting: Cardiothoracic Surgery

## 2013-02-10 ENCOUNTER — Encounter (HOSPITAL_COMMUNITY)
Admission: RE | Disposition: A | Discharge status: Home / Self Care | Payer: Medicare Other | Source: Ambulatory Visit | Attending: Cardiothoracic Surgery

## 2013-02-10 ENCOUNTER — Encounter (HOSPITAL_COMMUNITY): Payer: Self-pay

## 2013-02-10 ENCOUNTER — Encounter (HOSPITAL_COMMUNITY): Payer: Self-pay | Admitting: Certified Registered"

## 2013-02-10 DIAGNOSIS — I4891 Unspecified atrial fibrillation: Secondary | ICD-10-CM | POA: Diagnosis not present

## 2013-02-10 DIAGNOSIS — I739 Peripheral vascular disease, unspecified: Secondary | ICD-10-CM | POA: Diagnosis present

## 2013-02-10 DIAGNOSIS — I4510 Unspecified right bundle-branch block: Secondary | ICD-10-CM | POA: Diagnosis present

## 2013-02-10 DIAGNOSIS — I10 Essential (primary) hypertension: Secondary | ICD-10-CM | POA: Diagnosis present

## 2013-02-10 DIAGNOSIS — K219 Gastro-esophageal reflux disease without esophagitis: Secondary | ICD-10-CM | POA: Diagnosis present

## 2013-02-10 DIAGNOSIS — E785 Hyperlipidemia, unspecified: Secondary | ICD-10-CM | POA: Diagnosis present

## 2013-02-10 DIAGNOSIS — Z9861 Coronary angioplasty status: Secondary | ICD-10-CM

## 2013-02-10 DIAGNOSIS — J40 Bronchitis, not specified as acute or chronic: Secondary | ICD-10-CM | POA: Diagnosis not present

## 2013-02-10 DIAGNOSIS — R001 Bradycardia, unspecified: Secondary | ICD-10-CM | POA: Diagnosis present

## 2013-02-10 DIAGNOSIS — Z87891 Personal history of nicotine dependence: Secondary | ICD-10-CM

## 2013-02-10 DIAGNOSIS — I350 Nonrheumatic aortic (valve) stenosis: Secondary | ICD-10-CM | POA: Diagnosis present

## 2013-02-10 DIAGNOSIS — I480 Paroxysmal atrial fibrillation: Secondary | ICD-10-CM

## 2013-02-10 DIAGNOSIS — J9819 Other pulmonary collapse: Secondary | ICD-10-CM | POA: Diagnosis not present

## 2013-02-10 DIAGNOSIS — I70219 Atherosclerosis of native arteries of extremities with intermittent claudication, unspecified extremity: Secondary | ICD-10-CM | POA: Diagnosis present

## 2013-02-10 DIAGNOSIS — Z952 Presence of prosthetic heart valve: Secondary | ICD-10-CM

## 2013-02-10 DIAGNOSIS — I2510 Atherosclerotic heart disease of native coronary artery without angina pectoris: Secondary | ICD-10-CM | POA: Diagnosis present

## 2013-02-10 DIAGNOSIS — M129 Arthropathy, unspecified: Secondary | ICD-10-CM | POA: Diagnosis present

## 2013-02-10 DIAGNOSIS — D696 Thrombocytopenia, unspecified: Secondary | ICD-10-CM | POA: Diagnosis not present

## 2013-02-10 DIAGNOSIS — I498 Other specified cardiac arrhythmias: Secondary | ICD-10-CM | POA: Diagnosis present

## 2013-02-10 DIAGNOSIS — I359 Nonrheumatic aortic valve disorder, unspecified: Secondary | ICD-10-CM

## 2013-02-10 DIAGNOSIS — D62 Acute posthemorrhagic anemia: Secondary | ICD-10-CM | POA: Diagnosis not present

## 2013-02-10 HISTORY — DX: Hyperlipidemia, unspecified: E78.5

## 2013-02-10 HISTORY — DX: Coronary angioplasty status: Z98.61

## 2013-02-10 HISTORY — PX: INTRAOPERATIVE TRANSESOPHAGEAL ECHOCARDIOGRAM: SHX5062

## 2013-02-10 HISTORY — DX: Unspecified osteoarthritis, unspecified site: M19.90

## 2013-02-10 HISTORY — DX: Atherosclerotic heart disease of native coronary artery without angina pectoris: I25.10

## 2013-02-10 HISTORY — DX: Bradycardia, unspecified: R00.1

## 2013-02-10 HISTORY — DX: Nonrheumatic aortic (valve) stenosis: I35.0

## 2013-02-10 HISTORY — PX: AORTIC VALVE REPLACEMENT: SHX41

## 2013-02-10 HISTORY — DX: Presence of prosthetic heart valve: Z95.2

## 2013-02-10 LAB — POCT I-STAT 4, (NA,K, GLUC, HGB,HCT)
Glucose, Bld: 124 mg/dL — ABNORMAL HIGH (ref 70–99)
Glucose, Bld: 104 mg/dL — ABNORMAL HIGH (ref 70–99)
Glucose, Bld: 80 mg/dL (ref 70–99)
Glucose, Bld: 115 mg/dL — ABNORMAL HIGH (ref 70–99)
Glucose, Bld: 162 mg/dL — ABNORMAL HIGH (ref 70–99)
Glucose, Bld: 109 mg/dL — ABNORMAL HIGH (ref 70–99)
HCT: 38 % — ABNORMAL LOW (ref 39.0–52.0)
HCT: 27 % — ABNORMAL LOW (ref 39.0–52.0)
HCT: 35 % — ABNORMAL LOW (ref 39.0–52.0)
HCT: 31 % — ABNORMAL LOW (ref 39.0–52.0)
HCT: 27 % — ABNORMAL LOW (ref 39.0–52.0)
HCT: 34 % — ABNORMAL LOW (ref 39.0–52.0)
Hemoglobin: 12.9 g/dL — ABNORMAL LOW (ref 13.0–17.0)
Hemoglobin: 11.9 g/dL — ABNORMAL LOW (ref 13.0–17.0)
Hemoglobin: 9.2 g/dL — ABNORMAL LOW (ref 13.0–17.0)
Hemoglobin: 10.5 g/dL — ABNORMAL LOW (ref 13.0–17.0)
Hemoglobin: 9.2 g/dL — ABNORMAL LOW (ref 13.0–17.0)
Hemoglobin: 11.6 g/dL — ABNORMAL LOW (ref 13.0–17.0)
Potassium: 4.4 mEq/L (ref 3.5–5.1)
Potassium: 4.4 mEq/L (ref 3.5–5.1)
Potassium: 4.7 mEq/L (ref 3.5–5.1)
Potassium: 5.6 mEq/L — ABNORMAL HIGH (ref 3.5–5.1)
Potassium: 4.6 mEq/L (ref 3.5–5.1)
Potassium: 3.9 mEq/L (ref 3.5–5.1)
Sodium: 139 mEq/L (ref 135–145)
Sodium: 136 mEq/L (ref 135–145)
Sodium: 138 mEq/L (ref 135–145)
Sodium: 131 mEq/L — ABNORMAL LOW (ref 135–145)
Sodium: 136 mEq/L (ref 135–145)
Sodium: 137 mEq/L (ref 135–145)

## 2013-02-10 LAB — PROTIME-INR: INR: 1.43 (ref 0.00–1.49)

## 2013-02-10 LAB — POCT I-STAT 3, ART BLOOD GAS (G3+)
Acid-base deficit: 1 mmol/L (ref 0.0–2.0)
Acid-base deficit: 1 mmol/L (ref 0.0–2.0)
Acid-base deficit: 3 mmol/L — ABNORMAL HIGH (ref 0.0–2.0)
Acid-base deficit: 4 mmol/L — ABNORMAL HIGH (ref 0.0–2.0)
Acid-base deficit: 5 mmol/L — ABNORMAL HIGH (ref 0.0–2.0)
Acid-base deficit: 4 mmol/L — ABNORMAL HIGH (ref 0.0–2.0)
Bicarbonate: 26.7 mEq/L — ABNORMAL HIGH (ref 20.0–24.0)
Bicarbonate: 24.4 mEq/L — ABNORMAL HIGH (ref 20.0–24.0)
Bicarbonate: 21.5 mEq/L (ref 20.0–24.0)
Bicarbonate: 22.3 mEq/L (ref 20.0–24.0)
Bicarbonate: 21.3 mEq/L (ref 20.0–24.0)
Bicarbonate: 22.2 mEq/L (ref 20.0–24.0)
O2 Saturation: 100 %
O2 Saturation: 100 %
O2 Saturation: 100 %
O2 Saturation: 90 %
O2 Saturation: 95 %
O2 Saturation: 100 %
Patient temperature: 35.2
Patient temperature: 36.6
Patient temperature: 36.4
TCO2: 28 mmol/L (ref 0–100)
TCO2: 26 mmol/L (ref 0–100)
TCO2: 23 mmol/L (ref 0–100)
TCO2: 24 mmol/L (ref 0–100)
TCO2: 23 mmol/L (ref 0–100)
TCO2: 24 mmol/L (ref 0–100)
pCO2 arterial: 54.8 mmHg — ABNORMAL HIGH (ref 35.0–45.0)
pCO2 arterial: 43.9 mmHg (ref 35.0–45.0)
pCO2 arterial: 36.1 mmHg (ref 35.0–45.0)
pCO2 arterial: 40.2 mmHg (ref 35.0–45.0)
pCO2 arterial: 44.3 mmHg (ref 35.0–45.0)
pCO2 arterial: 44.9 mmHg (ref 35.0–45.0)
pH, Arterial: 7.296 — ABNORMAL LOW (ref 7.350–7.450)
pH, Arterial: 7.353 (ref 7.350–7.450)
pH, Arterial: 7.384 (ref 7.350–7.450)
pH, Arterial: 7.343 — ABNORMAL LOW (ref 7.350–7.450)
pH, Arterial: 7.289 — ABNORMAL LOW (ref 7.350–7.450)
pH, Arterial: 7.299 — ABNORMAL LOW (ref 7.350–7.450)
pO2, Arterial: 345 mmHg — ABNORMAL HIGH (ref 80.0–100.0)
pO2, Arterial: 273 mmHg — ABNORMAL HIGH (ref 80.0–100.0)
pO2, Arterial: 177 mmHg — ABNORMAL HIGH (ref 80.0–100.0)
pO2, Arterial: 56 mmHg — ABNORMAL LOW (ref 80.0–100.0)
pO2, Arterial: 85 mmHg (ref 80.0–100.0)
pO2, Arterial: 184 mmHg — ABNORMAL HIGH (ref 80.0–100.0)

## 2013-02-10 LAB — MAGNESIUM: Magnesium: 3.2 mg/dL — ABNORMAL HIGH (ref 1.5–2.5)

## 2013-02-10 LAB — CBC
HCT: 33.2 % — ABNORMAL LOW (ref 39.0–52.0)
Hemoglobin: 11.8 g/dL — ABNORMAL LOW (ref 13.0–17.0)
MCH: 32.1 pg (ref 26.0–34.0)
MCHC: 35.5 g/dL (ref 30.0–36.0)
MCV: 90.2 fL (ref 78.0–100.0)
Platelets: 80 10*3/uL — ABNORMAL LOW (ref 150–400)
RBC: 3.68 MIL/uL — ABNORMAL LOW (ref 4.22–5.81)
RDW: 13.5 % (ref 11.5–15.5)
WBC: 16.6 10*3/uL — ABNORMAL HIGH (ref 4.0–10.5)

## 2013-02-10 LAB — GLUCOSE, CAPILLARY
Glucose-Capillary: 118 mg/dL — ABNORMAL HIGH (ref 70–99)
Glucose-Capillary: 92 mg/dL (ref 70–99)
Glucose-Capillary: 117 mg/dL — ABNORMAL HIGH (ref 70–99)
Glucose-Capillary: 141 mg/dL — ABNORMAL HIGH (ref 70–99)
Glucose-Capillary: 119 mg/dL — ABNORMAL HIGH (ref 70–99)

## 2013-02-10 LAB — CREATININE, SERUM
Creatinine, Ser: 0.86 mg/dL (ref 0.50–1.35)
GFR calc Af Amer: >90 mL/min (ref >90)
GFR calc non Af Amer: 82 mL/min — ABNORMAL LOW (ref >90)

## 2013-02-10 LAB — POCT I-STAT, CHEM 8
BUN: 18 mg/dL (ref 6–23)
Calcium, Ion: 1.22 mmol/L (ref 1.13–1.30)
Chloride: 108 mEq/L (ref 96–112)
Creatinine, Ser: 1 mg/dL (ref 0.50–1.35)
Glucose, Bld: 115 mg/dL — ABNORMAL HIGH (ref 70–99)
HCT: 33 % — ABNORMAL LOW (ref 39.0–52.0)
Hemoglobin: 11.2 g/dL — ABNORMAL LOW (ref 13.0–17.0)
Potassium: 4.9 mEq/L (ref 3.5–5.1)
Sodium: 139 mEq/L (ref 135–145)
TCO2: 22 mmol/L (ref 0–100)

## 2013-02-10 LAB — HEMOGLOBIN AND HEMATOCRIT, BLOOD
HCT: 29.3 % — ABNORMAL LOW (ref 39.0–52.0)
Hemoglobin: 10.5 g/dL — ABNORMAL LOW (ref 13.0–17.0)

## 2013-02-10 LAB — PLATELET COUNT: Platelets: 102 10*3/uL — ABNORMAL LOW (ref 150–400)

## 2013-02-10 SURGERY — REPLACEMENT, AORTIC VALVE, OPEN
Anesthesia: General | Wound class: Clean

## 2013-02-10 MED ORDER — SODIUM CHLORIDE 0.9 % IV SOLN
INTRAVENOUS | Status: DC | PRN
  Administered 2013-02-10: 11:00:00 via INTRAVENOUS

## 2013-02-10 MED ORDER — PROPOFOL 10 MG/ML IV BOLUS
INTRAVENOUS | Status: DC | PRN
  Administered 2013-02-10: 40 mg via INTRAVENOUS

## 2013-02-10 MED ORDER — ACETAMINOPHEN 160 MG/5ML PO SOLN: 650.0000 mg | Freq: Once | ORAL | Status: AC

## 2013-02-10 MED ORDER — ACETAMINOPHEN 500 MG PO TABS
1000.0000 mg | ORAL_TABLET | Freq: Four times a day (QID) | ORAL | Status: DC
  Administered 2013-02-11 – 2013-02-12 (×6): 1000 mg via ORAL
  Filled 2013-02-10 (×10): qty 2

## 2013-02-10 MED ORDER — DEXMEDETOMIDINE HCL IN NACL 200 MCG/50ML IV SOLN
0.4000 ug/kg/h | INTRAVENOUS | Status: DC
  Filled 2013-02-10: qty 50

## 2013-02-10 MED ORDER — HEMOSTATIC AGENTS (NO CHARGE) OPTIME
TOPICAL | Status: DC | PRN
  Administered 2013-02-10: 1 via TOPICAL

## 2013-02-10 MED ORDER — CHLORHEXIDINE GLUCONATE 4 % EX LIQD
30.0000 mL | CUTANEOUS | Status: DC
Start: 2013-02-10 — End: 2013-02-10

## 2013-02-10 MED ORDER — DEXMEDETOMIDINE HCL IN NACL 200 MCG/50ML IV SOLN: 0.1000 ug/kg/h | INTRAVENOUS | Status: DC

## 2013-02-10 MED ORDER — PANTOPRAZOLE SODIUM 40 MG PO TBEC: 40.0000 mg | DELAYED_RELEASE_TABLET | Freq: Every day | ORAL | Status: DC

## 2013-02-10 MED ORDER — MORPHINE SULFATE 2 MG/ML IJ SOLN
1.0000 mg | INTRAMUSCULAR | Status: AC | PRN
  Administered 2013-02-10: 2 mg via INTRAVENOUS
  Filled 2013-02-10: qty 1

## 2013-02-10 MED ORDER — ASPIRIN EC 325 MG PO TBEC
325.0000 mg | DELAYED_RELEASE_TABLET | Freq: Every day | ORAL | Status: DC
  Administered 2013-02-11: 325 mg via ORAL
  Filled 2013-02-10 (×2): qty 1

## 2013-02-10 MED ORDER — BISACODYL 5 MG PO TBEC
10.0000 mg | DELAYED_RELEASE_TABLET | Freq: Every day | ORAL | Status: DC
  Administered 2013-02-11: 10 mg via ORAL
  Filled 2013-02-10: qty 2

## 2013-02-10 MED ORDER — 0.9 % SODIUM CHLORIDE (POUR BTL) OPTIME
TOPICAL | Status: DC | PRN
  Administered 2013-02-10: 1000 mL

## 2013-02-10 MED ORDER — MIDAZOLAM HCL 5 MG/5ML IJ SOLN
INTRAMUSCULAR | Status: DC | PRN
  Administered 2013-02-10 (×2): 2 mg via INTRAVENOUS
  Administered 2013-02-10 (×2): 3 mg via INTRAVENOUS

## 2013-02-10 MED ORDER — ONDANSETRON HCL 4 MG/2ML IJ SOLN
4.0000 mg | Freq: Four times a day (QID) | INTRAMUSCULAR | Status: DC | PRN
  Administered 2013-02-11: 4 mg via INTRAVENOUS
  Filled 2013-02-10: qty 2

## 2013-02-10 MED ORDER — LIDOCAINE HCL (CARDIAC) 20 MG/ML IV SOLN
INTRAVENOUS | Status: DC | PRN
  Administered 2013-02-10: 80 mg via INTRAVENOUS

## 2013-02-10 MED ORDER — SODIUM CHLORIDE 0.9 % IV SOLN: INTRAVENOUS | Status: DC

## 2013-02-10 MED ORDER — EZETIMIBE 10 MG PO TABS
10.0000 mg | ORAL_TABLET | Freq: Every day | ORAL | Status: DC
  Administered 2013-02-11 – 2013-02-17 (×7): 10 mg via ORAL
  Filled 2013-02-10 (×8): qty 1

## 2013-02-10 MED ORDER — DOCUSATE SODIUM 100 MG PO CAPS
200.0000 mg | ORAL_CAPSULE | Freq: Every day | ORAL | Status: DC
  Administered 2013-02-11: 200 mg via ORAL
  Filled 2013-02-10: qty 2

## 2013-02-10 MED ORDER — VECURONIUM BROMIDE 10 MG IV SOLR
INTRAVENOUS | Status: DC | PRN
  Administered 2013-02-10: 20 mg via INTRAVENOUS

## 2013-02-10 MED ORDER — ACETAMINOPHEN 160 MG/5ML PO SOLN: 1000.0000 mg | Freq: Four times a day (QID) | ORAL | Status: DC

## 2013-02-10 MED ORDER — METOPROLOL TARTRATE 1 MG/ML IV SOLN: 2.5000 mg | INTRAVENOUS | Status: DC | PRN

## 2013-02-10 MED ORDER — LACTATED RINGERS IV SOLN
INTRAVENOUS | Status: DC | PRN
  Administered 2013-02-10: 07:00:00 via INTRAVENOUS

## 2013-02-10 MED ORDER — SODIUM CHLORIDE 0.9 % IJ SOLN
OROMUCOSAL | Status: DC | PRN
  Administered 2013-02-10 (×2): via TOPICAL

## 2013-02-10 MED ORDER — PHENYLEPHRINE HCL 10 MG/ML IJ SOLN
0.0000 ug/min | INTRAVENOUS | Status: DC
  Filled 2013-02-10: qty 2

## 2013-02-10 MED ORDER — SODIUM CHLORIDE 0.9 % IJ SOLN
3.0000 mL | Freq: Two times a day (BID) | INTRAMUSCULAR | Status: DC
  Administered 2013-02-11 (×2): 3 mL via INTRAVENOUS

## 2013-02-10 MED ORDER — HEPARIN SODIUM (PORCINE) 1000 UNIT/ML IJ SOLN
INTRAMUSCULAR | Status: DC | PRN
  Administered 2013-02-10: 22000 [IU] via INTRAVENOUS

## 2013-02-10 MED ORDER — FENTANYL CITRATE 0.05 MG/ML IJ SOLN
INTRAMUSCULAR | Status: DC | PRN
  Administered 2013-02-10: 25 ug via INTRAVENOUS
  Administered 2013-02-10 (×2): 250 ug via INTRAVENOUS
  Administered 2013-02-10: 75 ug via INTRAVENOUS
  Administered 2013-02-10: 250 ug via INTRAVENOUS
  Administered 2013-02-10: 150 ug via INTRAVENOUS
  Administered 2013-02-10 (×2): 250 ug via INTRAVENOUS

## 2013-02-10 MED ORDER — DEXTROSE 5 % IV SOLN
0.2500 ug/kg/min | INTRAVENOUS | Status: DC
  Filled 2013-02-10: qty 4

## 2013-02-10 MED ORDER — VANCOMYCIN HCL IN DEXTROSE 1-5 GM/200ML-% IV SOLN
1000.0000 mg | Freq: Once | INTRAVENOUS | Status: AC
  Administered 2013-02-10: 1000 mg via INTRAVENOUS
  Filled 2013-02-10: qty 200

## 2013-02-10 MED ORDER — LACTATED RINGERS IV SOLN: 500.0000 mL | Freq: Once | INTRAVENOUS | Status: AC | PRN

## 2013-02-10 MED ORDER — SODIUM CHLORIDE 0.9 % IV SOLN: 250.0000 mL | INTRAVENOUS | Status: DC

## 2013-02-10 MED ORDER — ALBUMIN HUMAN 5 % IV SOLN
250.0000 mL | INTRAVENOUS | Status: AC | PRN
  Administered 2013-02-10 (×2): 250 mL via INTRAVENOUS

## 2013-02-10 MED ORDER — PROTAMINE SULFATE 10 MG/ML IV SOLN
INTRAVENOUS | Status: DC | PRN
  Administered 2013-02-10: 160 mg via INTRAVENOUS

## 2013-02-10 MED ORDER — SODIUM CHLORIDE 0.9 % IJ SOLN: 3.0000 mL | INTRAMUSCULAR | Status: DC | PRN

## 2013-02-10 MED ORDER — DEXTROSE 5 % IV SOLN
1.5000 g | Freq: Two times a day (BID) | INTRAVENOUS | Status: AC
  Administered 2013-02-10 – 2013-02-12 (×4): 1.5 g via INTRAVENOUS
  Filled 2013-02-10 (×4): qty 1.5

## 2013-02-10 MED ORDER — METOPROLOL TARTRATE 12.5 MG HALF TABLET: 12.5000 mg | ORAL_TABLET | Freq: Once | ORAL | Status: DC

## 2013-02-10 MED ORDER — LACTATED RINGERS IV SOLN
INTRAVENOUS | Status: DC | PRN
  Administered 2013-02-10 (×2): via INTRAVENOUS

## 2013-02-10 MED ORDER — METOPROLOL TARTRATE 12.5 MG HALF TABLET
12.5000 mg | ORAL_TABLET | Freq: Two times a day (BID) | ORAL | Status: DC
  Filled 2013-02-10 (×5): qty 1

## 2013-02-10 MED ORDER — SODIUM CHLORIDE 0.9 % IV SOLN
INTRAVENOUS | Status: DC
  Filled 2013-02-10: qty 1

## 2013-02-10 MED ORDER — BISACODYL 10 MG RE SUPP: 10.0000 mg | Freq: Every day | RECTAL | Status: DC

## 2013-02-10 MED ORDER — METOPROLOL TARTRATE 25 MG/10 ML ORAL SUSPENSION
12.5000 mg | Freq: Two times a day (BID) | ORAL | Status: DC
  Filled 2013-02-10 (×5): qty 5

## 2013-02-10 MED ORDER — ACETAMINOPHEN 650 MG RE SUPP
650.0000 mg | Freq: Once | RECTAL | Status: AC
  Administered 2013-02-10: 650 mg via RECTAL

## 2013-02-10 MED ORDER — ROCURONIUM BROMIDE 100 MG/10ML IV SOLN
INTRAVENOUS | Status: DC | PRN
  Administered 2013-02-10: 50 mg via INTRAVENOUS

## 2013-02-10 MED ORDER — INSULIN REGULAR BOLUS VIA INFUSION
0.0000 [IU] | Freq: Three times a day (TID) | INTRAVENOUS | Status: DC
  Administered 2013-02-11: 3 [IU] via INTRAVENOUS
  Filled 2013-02-10: qty 10

## 2013-02-10 MED ORDER — MAGNESIUM SULFATE 40 MG/ML IJ SOLN
4.0000 g | Freq: Once | INTRAMUSCULAR | Status: AC
Start: 2013-02-10 — End: 2013-02-10
  Administered 2013-02-10: 4 g via INTRAVENOUS
  Filled 2013-02-10: qty 100

## 2013-02-10 MED ORDER — POTASSIUM CHLORIDE 10 MEQ/50ML IV SOLN
10.0000 meq | INTRAVENOUS | Status: AC
  Administered 2013-02-10 (×3): 10 meq via INTRAVENOUS

## 2013-02-10 MED ORDER — LACTATED RINGERS IV SOLN
INTRAVENOUS | Status: DC | PRN
  Administered 2013-02-10 (×2): via INTRAVENOUS

## 2013-02-10 MED ORDER — NITROGLYCERIN IN D5W 200-5 MCG/ML-% IV SOLN: 0.0000 ug/min | INTRAVENOUS | Status: DC

## 2013-02-10 MED ORDER — MORPHINE SULFATE 2 MG/ML IJ SOLN
2.0000 mg | INTRAMUSCULAR | Status: DC | PRN
  Administered 2013-02-10 – 2013-02-11 (×2): 2 mg via INTRAVENOUS
  Filled 2013-02-10 (×2): qty 1

## 2013-02-10 MED ORDER — ASPIRIN 81 MG PO CHEW: 324.0000 mg | CHEWABLE_TABLET | Freq: Every day | ORAL | Status: DC

## 2013-02-10 MED ORDER — SODIUM CHLORIDE 0.45 % IV SOLN: INTRAVENOUS | Status: DC

## 2013-02-10 MED ORDER — MIDAZOLAM HCL 2 MG/2ML IJ SOLN: 2.0000 mg | INTRAMUSCULAR | Status: DC | PRN

## 2013-02-10 MED ORDER — OXYCODONE HCL 5 MG PO TABS
5.0000 mg | ORAL_TABLET | ORAL | Status: DC | PRN
  Administered 2013-02-11 (×2): 5 mg via ORAL
  Administered 2013-02-11: 10 mg via ORAL
  Administered 2013-02-11: 5 mg via ORAL
  Filled 2013-02-10: qty 1
  Filled 2013-02-10: qty 2
  Filled 2013-02-10 (×2): qty 1

## 2013-02-10 MED ORDER — FAMOTIDINE IN NACL 20-0.9 MG/50ML-% IV SOLN
20.0000 mg | Freq: Two times a day (BID) | INTRAVENOUS | Status: AC
  Administered 2013-02-10 – 2013-02-11 (×2): 20 mg via INTRAVENOUS
  Filled 2013-02-10: qty 50

## 2013-02-10 MED ORDER — ALBUTEROL SULFATE (5 MG/ML) 0.5% IN NEBU
2.5000 mg | INHALATION_SOLUTION | Freq: Four times a day (QID) | RESPIRATORY_TRACT | Status: DC
  Administered 2013-02-10 – 2013-02-11 (×6): 2.5 mg via RESPIRATORY_TRACT
  Filled 2013-02-10 (×6): qty 0.5

## 2013-02-10 MED ORDER — LACTATED RINGERS IV SOLN: INTRAVENOUS | Status: DC

## 2013-02-10 MED ORDER — ATORVASTATIN CALCIUM 40 MG PO TABS
40.0000 mg | ORAL_TABLET | Freq: Every day | ORAL | Status: DC
  Administered 2013-02-11 – 2013-02-17 (×7): 40 mg via ORAL
  Filled 2013-02-10 (×8): qty 1

## 2013-02-10 MED ORDER — SODIUM CHLORIDE 0.9 % IV SOLN
0.5000 g/h | Freq: Once | INTRAVENOUS | Status: DC
  Filled 2013-02-10: qty 20

## 2013-02-10 SURGICAL SUPPLY — 76 items
ADAPTER CARDIO PERF ANTE/RETRO (ADAPTER) ×2 IMPLANT
ADPR PRFSN 84XANTGRD RTRGD (ADAPTER) ×1
APPLICATOR COTTON TIP 6IN STRL (MISCELLANEOUS) IMPLANT
ATTRACTOMAT 16X20 MAGNETIC DRP (DRAPES) ×2 IMPLANT
BAG DECANTER FOR FLEXI CONT (MISCELLANEOUS) ×2 IMPLANT
BLADE STERNUM SYSTEM 6 (BLADE) ×2 IMPLANT
BLADE SURG 15 STRL LF DISP TIS (BLADE) ×1 IMPLANT
BLADE SURG 15 STRL SS (BLADE) ×2
BOOT SUTURE AID YELLOW STND (SUTURE) ×2 IMPLANT
CANISTER SUCTION 2500CC (MISCELLANEOUS) ×2 IMPLANT
CANNULA AORTIC HI-FLOW 6.5M20F (CANNULA) ×2 IMPLANT
CANNULA GUNDRY RCSP 15FR (MISCELLANEOUS) IMPLANT
CANNULA VENOUS DUAL 32/40 (CANNULA) IMPLANT
CATH CPB KIT GERHARDT (MISCELLANEOUS) ×2 IMPLANT
CATH HEART VENT LEFT (CATHETERS) ×1 IMPLANT
CATH RETROPLEGIA CORONARY 14FR (CATHETERS) ×2 IMPLANT
CATH THORACIC 28FR (CATHETERS) IMPLANT
CATH/SQUID NICHOLS JEHLE COR (CATHETERS) IMPLANT
CLOTH BEACON ORANGE TIMEOUT ST (SAFETY) ×2 IMPLANT
CONN ST 1/4X3/8  BEN (MISCELLANEOUS) ×1
CONN ST 1/4X3/8 BEN (MISCELLANEOUS) IMPLANT
COVER SURGICAL LIGHT HANDLE (MISCELLANEOUS) ×2 IMPLANT
CRADLE DONUT ADULT HEAD (MISCELLANEOUS) ×1 IMPLANT
DRAIN CHANNEL 28F RND 3/8 FF (WOUND CARE) ×3 IMPLANT
DRAIN CHANNEL 32F RND 10.7 FF (WOUND CARE) IMPLANT
DRAPE CARDIOVASCULAR INCISE (DRAPES) ×2
DRAPE SLUSH/WARMER DISC (DRAPES) IMPLANT
DRAPE SRG 135X102X78XABS (DRAPES) ×1 IMPLANT
DRSG AQUACEL AG ADV 3.5X14 (GAUZE/BANDAGES/DRESSINGS) ×2 IMPLANT
ELECT CAUTERY BLADE 6.4 (BLADE) ×2 IMPLANT
ELECT REM PT RETURN 9FT ADLT (ELECTROSURGICAL) ×4
ELECTRODE REM PT RTRN 9FT ADLT (ELECTROSURGICAL) ×2 IMPLANT
GLOVE BIO SURGEON STRL SZ 6.5 (GLOVE) ×8 IMPLANT
GLOVE BIOGEL M STER SZ 6 (GLOVE) ×1 IMPLANT
GLOVE BIOGEL PI IND STRL 7.0 (GLOVE) IMPLANT
GLOVE BIOGEL PI INDICATOR 7.0 (GLOVE) ×3
GLOVE SS BIOGEL STRL SZ 7.5 (GLOVE) IMPLANT
GLOVE SUPERSENSE BIOGEL SZ 7.5 (GLOVE) ×1
GOWN STRL NON-REIN LRG LVL3 (GOWN DISPOSABLE) ×10 IMPLANT
HEMOSTAT POWDER SURGIFOAM 1G (HEMOSTASIS) ×6 IMPLANT
HEMOSTAT SURGICEL 2X14 (HEMOSTASIS) ×3 IMPLANT
INSERT FOGARTY XLG (MISCELLANEOUS) IMPLANT
KIT BASIN OR (CUSTOM PROCEDURE TRAY) ×2 IMPLANT
KIT ROOM TURNOVER OR (KITS) ×2 IMPLANT
KIT SUCTION CATH 14FR (SUCTIONS) ×4 IMPLANT
LEAD PACING MYOCARDI (MISCELLANEOUS) IMPLANT
LINE VENT (MISCELLANEOUS) ×1 IMPLANT
NDL 18GX1X1/2 (RX/OR ONLY) (NEEDLE) ×1 IMPLANT
NEEDLE 18GX1X1/2 (RX/OR ONLY) (NEEDLE) ×2 IMPLANT
NS IRRIG 1000ML POUR BTL (IV SOLUTION) ×10 IMPLANT
PACK OPEN HEART (CUSTOM PROCEDURE TRAY) ×2 IMPLANT
PAD ARMBOARD 7.5X6 YLW CONV (MISCELLANEOUS) ×4 IMPLANT
SEALANT SURG COSEAL 4ML (VASCULAR PRODUCTS) ×1 IMPLANT
SET CARDIOPLEGIA MPS 5001102 (MISCELLANEOUS) ×1 IMPLANT
SPONGE GAUZE 4X4 12PLY (GAUZE/BANDAGES/DRESSINGS) ×3 IMPLANT
SPONGE LAP 18X18 X RAY DECT (DISPOSABLE) ×1 IMPLANT
SUT BONE WAX W31G (SUTURE) IMPLANT
SUT ETHIBON 2 0 V 52N 30 (SUTURE) ×4 IMPLANT
SUT PROLENE 3 0 RB 1 (SUTURE) IMPLANT
SUT PROLENE 3 0 SH 1 (SUTURE) ×7 IMPLANT
SUT PROLENE 3 0 SH1 36 (SUTURE) ×1 IMPLANT
SUT PROLENE 4 0 RB 1 (SUTURE) ×12
SUT PROLENE 4-0 RB1 .5 CRCL 36 (SUTURE) ×1 IMPLANT
SUT STEEL 6MS V (SUTURE) ×2 IMPLANT
SUT STEEL SZ 6 DBL 3X14 BALL (SUTURE) ×2 IMPLANT
SUT VIC AB 1 CTX 18 (SUTURE) ×4 IMPLANT
SUTURE E-PAK OPEN HEART (SUTURE) ×2 IMPLANT
SYSTEM SAHARA CHEST DRAIN ATS (WOUND CARE) ×2 IMPLANT
TOWEL OR 17X24 6PK STRL BLUE (TOWEL DISPOSABLE) ×4 IMPLANT
TOWEL OR 17X26 10 PK STRL BLUE (TOWEL DISPOSABLE) ×4 IMPLANT
TRAY FOLEY IC TEMP SENS 14FR (CATHETERS) ×2 IMPLANT
TUBE SUCT INTRACARD DLP 20F (MISCELLANEOUS) IMPLANT
UNDERPAD 30X30 INCONTINENT (UNDERPADS AND DIAPERS) ×2 IMPLANT
VALVE MAGNA EASE AORTIC 23MM (Prosthesis & Implant Heart) ×1 IMPLANT
VENT LEFT HEART 12002 (CATHETERS) ×2
WATER STERILE IRR 1000ML POUR (IV SOLUTION) ×4 IMPLANT

## 2013-02-10 NOTE — OR Nursing (Signed)
SICU first call @ [phone]

## 2013-02-10 NOTE — Progress Notes (Signed)
Patient ID: REMUS HAGEDORN, male   DOB: 1935/04/21, 78 y.o.   MRN: [id number] EVENING ROUNDS NOTE :     [address]       [place] [id number]             [phone]                 Day of Surgery Procedure(s) (LRB): AORTIC VALVE REPLACEMENT (AVR) (N/A) INTRAOPERATIVE TRANSESOPHAGEAL ECHOCARDIOGRAM (N/A)  Total Length of Stay:  LOS: 0 days  BP 106/71  Pulse 90  Temp(Src) 96.3 F (35.7 C) (Core (Comment))  Resp 12  Ht 5' 4.96" (1.65 m)  Wt 132 lb 4.4 oz (60 kg)  BMI 22.04 kg/m2  SpO2 100%  .Intake/Output     09/01 0701 - 09/02 0700 09/02 0701 - 09/03 0700   I.V. (mL/kg)  3664.5 (61.1)   Blood  730   IV Piggyback  550   Total Intake(mL/kg)  4944.5 (82.4)   Urine (mL/kg/hr)  2525 (4.5)   Blood  1400 (2.5)   Chest Tube  70 (0.1)   Total Output   3995   Net   +949.5          . sodium chloride 20 mL/hr at 02/10/13 1245  . sodium chloride 20 mL/hr at 02/10/13 1245  . [START ON 02/11/2013] sodium chloride    . dexmedetomidine 0.5 mcg/kg/hr (02/10/13 1552)  . insulin (NOVOLIN-R) infusion 0.6 mL/hr at 02/10/13 1600  . lactated ringers 20 mL/hr at 02/10/13 1245  . nitroGLYCERIN Stopped (02/10/13 1525)  . phenylephrine (NEO-SYNEPHRINE) Adult infusion Stopped (02/10/13 1245)     Lab Results  Component Value Date   WBC 14.9* 02/10/2013   HGB 11.6* 02/10/2013   HCT 34.0* 02/10/2013   PLT 83* 02/10/2013   GLUCOSE 109* 02/10/2013   CHOL 156 11/28/2012   TRIG 54 11/28/2012   HDL 53 11/28/2012   LDLCALC 92 11/28/2012   ALT 20 02/06/2013   AST 26 02/06/2013   NA 137 02/10/2013   K 3.9 02/10/2013   CL 101 02/06/2013   CREATININE 1.12 02/06/2013   BUN 28* 02/06/2013   CO2 26 02/06/2013   TSH 3.636 01/26/2013   PSA 1.91 11/28/2012   INR 1.43 02/10/2013   HGBA1C 6.3* 02/06/2013   Early postop Still on vent Starting to wake up, opens eyes Not bleeding  Delight Ovens MD  Beeper [phone] Office [phone] 02/10/2013 4:20 PM

## 2013-02-10 NOTE — Transfer of Care (Signed)
Immediate Anesthesia Transfer of Care Note  Patient: Ralph Drake  Procedure(s) Performed: Procedure(s): AORTIC VALVE REPLACEMENT (AVR) (N/A) INTRAOPERATIVE TRANSESOPHAGEAL ECHOCARDIOGRAM (N/A)  Patient Location: PACU and ICU  Anesthesia Type:General  Level of Consciousness: unresponsive  Airway & Oxygen Therapy: Patient remains intubated per anesthesia plan and Patient placed on Ventilator (see vital sign flow sheet for setting)  Post-op Assessment: Report given to PACU RN and Post -op Vital signs reviewed and stable  Post vital signs: Reviewed and stable  Complications: No apparent anesthesia complications

## 2013-02-10 NOTE — OR Nursing (Signed)
Volunteer called @ 1112 to notify patient off pump

## 2013-02-10 NOTE — Anesthesia Postprocedure Evaluation (Signed)
  Anesthesia Post-op Note  Patient: Ralph Drake  Procedure(s) Performed: Procedure(s): AORTIC VALVE REPLACEMENT (AVR) (N/A) INTRAOPERATIVE TRANSESOPHAGEAL ECHOCARDIOGRAM (N/A)  Patient Location: PACU and SICU  Anesthesia Type:General  Level of Consciousness: unresponsive  Airway and Oxygen Therapy: Patient remains intubated per anesthesia plan and Patient placed on Ventilator (see vital sign flow sheet for setting)  Post-op Pain: none  Post-op Assessment: Post-op Vital signs reviewed and Patient's Cardiovascular Status Stable  Post-op Vital Signs: Reviewed and stable  Complications: No apparent anesthesia complications

## 2013-02-10 NOTE — H&P (Signed)
[address]       [place] [address]             [phone]                                Ralph Drake [facility] Medical Record #[id number] Date of Birth: 02-12-1935  Referring: No ref. provider found Primary Care: Aura Dials, MD  Chief Complaint:    Aortic Stenosis   History of Present Illness:    Patient is a 78 year old male with known aortic stenosis. He has been followed with serial echocardiograms. He has known about the aortic stenosis for 3-4 years. He now notes increasing fatigue with exertion shortness of breath with exertion, he denies resting shortness of breath orthopnea syncope presyncope or angina. Because of worsening degree of aortic stenosis and symptoms he is referred by Dr. Alanda Amass for consideration of aortic valve replacement. The patient has known coronary occlusive disease having had a angioplasty in 1993 and stents placed in June of 2011. He has known peripheral vascular disease. He is a current smoker.   cardiac catheterization has been done  Current Activity/ Functional Status:  Patient is independent with mobility/ambulation, transfers, ADL's, IADL's.  Zubrod Score: At the time of surgery this patient's most appropriate activity status/level should be described as: []  Normal activity, no symptoms [x]  Symptoms, fully ambulatory []  Symptoms, in bed less than or equal to 50% of the time []  Symptoms, in bed greater than 50% of the time but less than 100% []  Bedridden []  Moribund   Past Medical History  Diagnosis Date  . Hypertension   . GERD (gastroesophageal reflux disease)   . Coronary artery disease   . Heart murmur     per Dr. Alanda Amass 2012  . Arthritis   . Peripheral vascular disease     Ultrasound of legs 07/12/2011  . Trigger ring finger of left hand   . Olecranon bursitis of left elbow   . Shortness of breath     with exertion    Past Surgical History  Procedure Laterality Date  .  Tonsillectomy      as child  . Right hand surgery    . Trigger finger release  07/23/2011    Procedure: RELEASE TRIGGER FINGER/A-1 PULLEY;  Surgeon: Nilda Simmer, MD;  Location: [facility];  Service: Orthopedics;  Laterality: Left;  release trigger left ring finger patient had supraclavicular block in preop  . Olecranon bursectomy  07/23/2011    Procedure: OLECRANON BURSA;  Surgeon: Nilda Simmer, MD;  Location: [facility];  Service: Orthopedics;  Laterality: Left;  excision olecranon bursa left elbow patient had supraclaviular block in preop  . Coronary angioplasty      2014  . Rotator cuff repair Right     Family History  Problem Relation Age of Onset  . Hypertension Mother     History   Social History  . Marital Status: Married    Spouse Name: N/A    Number of Children: N/A  . Years of Education: N/A   Occupational History  . Not on file.   Social History Main Topics  . Smoking status: Former Smoker -- 0.50 packs/day for 60 years    Types: Cigarettes    Quit date: 01/31/2013  . Smokeless tobacco: Never Used  . Alcohol Use: No  . Drug Use: No  History  Smoking status  . Former Smoker -- 0.50 packs/day for 60 years  . Types: Cigarettes  . Quit date: 01/31/2013  Smokeless tobacco  . Never Used    History  Alcohol Use No     No Known Allergies  Current Facility-Administered Medications  Medication Dose Route Frequency Provider Last Rate Last Dose  . aminocaproic acid (AMICAR) 10 g in sodium chloride 0.9 % 100 mL infusion   Intravenous To OR Delight Ovens, MD      . cefUROXime (ZINACEF) 1.5 g in dextrose 5 % 50 mL IVPB  1.5 g Intravenous To OR Delight Ovens, MD      . cefUROXime (ZINACEF) 750 mg in dextrose 5 % 50 mL IVPB  750 mg Intravenous To OR Delight Ovens, MD      . chlorhexidine (HIBICLENS) 4 % liquid 2 application  30 mL Topical UD Delight Ovens, MD      . dexmedetomidine (PRECEDEX) 400 MCG/100ML  infusion  0.1-0.7 mcg/kg/hr Intravenous To OR Delight Ovens, MD      . DOPamine (INTROPIN) 800 mg in dextrose 5 % 250 mL infusion  2-20 mcg/kg/min Intravenous To OR Delight Ovens, MD      . EPINEPHrine (ADRENALIN) 4,000 mcg in dextrose 5 % 250 mL infusion  0.5-20 mcg/min Intravenous To OR Delight Ovens, MD      . heparin 2,500 Units, papaverine 30 mg in electrolyte-148 (PLASMALYTE-148) 500 mL irrigation   Irrigation To OR Delight Ovens, MD      . heparin 30,000 units/NS 1000 mL solution for CELLSAVER   Other To OR Delight Ovens, MD      . insulin regular (NOVOLIN R,HUMULIN R) 1 Units/mL in sodium chloride 0.9 % 100 mL infusion   Intravenous To OR Delight Ovens, MD      . magnesium sulfate (IV Push/IM) injection 40 mEq  40 mEq Other To OR Delight Ovens, MD      . metoprolol tartrate (LOPRESSOR) tablet 12.5 mg  12.5 mg Oral Once Delight Ovens, MD      . nitroGLYCERIN 0.2 mg/mL in dextrose 5 % infusion  2-200 mcg/min Intravenous To OR Delight Ovens, MD      . phenylephrine (NEO-SYNEPHRINE) 20,000 mcg in dextrose 5 % 250 mL infusion  30-200 mcg/min Intravenous To OR Delight Ovens, MD      . potassium chloride injection 80 mEq  80 mEq Other To OR Delight Ovens, MD      . vancomycin (VANCOCIN) 1,250 mg in sodium chloride 0.9 % 250 mL IVPB  1,250 mg Intravenous To OR Delight Ovens, MD           Review of Systems:     Cardiac Review of Systems: Y or N  Chest Pain [ n   ]  Resting SOB [ n  ] Exertional SOB  [ y ]  Pollyann Kennedy Milo.Brash  ]   Pedal Edema [  n ]    Palpitations [ n ] Syncope  [ n ]   Presyncope [n   ]  General Review of Systems: [Y] = yes [  ]=no Constitional: recent weight change [  ]; anorexia [  ]; fatigue [ y ]; nausea [  n]; night sweats [ n]; fever [ n ]; or chills [  n];  Dental: poor dentition[  ]; Last  Dentist visit:   Eye : blurred vision [  ]; diplopia [   ]; vision changes [  ];  Amaurosis fugax[  ]; Resp: cough [ n ];  wheezing[ n ];  hemoptysis[ n ]; shortness of breath[  ]; paroxysmal nocturnal dyspnea[ n ]; dyspnea on exertion[ y ]; or orthopnea[n  ];  GI:  gallstones[  ], vomiting[  ];  dysphagia[  ]; melena[n  ];  hematochezia [ n ]; heartburn[  n];   Hx of  Colonoscopy[y  ]; GU: kidney stones [  ]; hematuria[  ];   dysuria [  ];  nocturia[  ];  history of     obstruction [  ]; urinary frequency [n  ]             Skin: rash, swelling[  ];, hair loss[  ];  peripheral edema[  ];  or itching[  ]; Musculosketetal: myalgias[  ];  joint swelling[  ];  joint erythema[  ];  joint pain[  ];  back pain[  ];  Heme/Lymph: bruising[ n ];  bleeding[ n ];  anemia[  ];  Neuro: TIA[  ];  headaches[  ];  stroke[  ];  vertigo[  ];  seizures[ n ];   paresthesias[  ];  difficulty walking[ n ];  Psych:depression[  ]; anxiety[  ];  Endocrine: diabetes[ n ];  thyroid dysfunction[n  ];  Immunizations: Flu [ y ]; Pneumococcal[ y ];  Other:  Physical Exam: BP 194/66  Pulse 42  Temp(Src) 97.1 F (36.2 C) (Oral)  Resp 16  Wt 133 lb 9.6 oz (60.6 kg)  BMI 22.23 kg/m2  SpO2 97%  General appearance: alert, cooperative and appears stated age Neurologic: intact Heart: regular rate and rhythm and systolic murmur: holosystolic 3/6, crescendo throughout the precordium Lungs: clear to auscultation bilaterally and normal percussion bilaterally Abdomen: soft, non-tender; bowel sounds normal; no masses,  no organomegaly Extremities: extremities normal, atraumatic, no cyanosis or edema and Homans sign is negative, no sign of DVT Patient has no carotid bruits, he has no cervical or supraclavicular or axillary adenopathy Has +2 femoral pulses bilaterally 1+ DP and PT pulses bilaterally. Appears to have adequate vein for coronary bypass grafting in both lower extremities  Diagnostic Studies & Laboratory data:      Recent Radiology Findings:    01/28/2013   *RADIOLOGY REPORT*  Clinical Data: Pre heart catheterization, history hypertension, coronary artery disease, peripheral vascular disease, smoking  CHEST - 2 VIEW  Comparison: 11/10/2009  Findings: Enlargement of cardiac silhouette. Calcified tortuous aorta. Mediastinal contours and pulmonary vascularity otherwise normal. Lungs hyperinflated with minimal peribronchial thickening. No pulmonary infiltrate, pleural effusion or pneumothorax. Bones demineralized.  IMPRESSION: Enlargement of cardiac silhouette. Question COPD.   Original Report Authenticated By: Ulyses Southward, M.D.    Recent Lab Findings: Lab Results  Component Value Date   WBC 8.7 02/06/2013   HGB 14.7 02/06/2013   HCT 43.0 02/06/2013   PLT 180 02/06/2013   GLUCOSE 100* 02/06/2013   CHOL 156 11/28/2012   TRIG 54 11/28/2012   HDL 53 11/28/2012   LDLCALC 92 11/28/2012   ALT 20 02/06/2013   AST 26 02/06/2013   NA 137 02/06/2013   K 4.6 02/06/2013   CL 101 02/06/2013   CREATININE 1.12 02/06/2013   BUN 28* 02/06/2013   CO2 26 02/06/2013   TSH 3.636 01/26/2013   PSA 1.91 11/28/2012   INR 0.92 02/06/2013   HGBA1C  6.3* 02/06/2013   ECHO: Patient: Ralph Drake, Ralph Drake MR #: [id number] Study Date: 12/18/2012 Gender: M Age: 33 Height: 165.1cm Weight: 59kg BSA: 1.89m^2 Pt. Status: Room:  Cheron Schaumann, MD [facility] PERFORMING Susa Griffins, MD [facility] REFERRING Susa Griffins, MD [facility] ATTENDING Nanetta Batty, MD SONOGRAPHER Metro Kung, RDCS cc:  ------------------------------------------------------------ LV EF: 55% - 60%  ------------------------------------------------------------ Indications: Aortic stenosis 424.1.  ------------------------------------------------------------ History: PMH: Coronary artery disease. Aortic valve disease. Risk factors: RBBB, bradycardia Current tobacco use. Dyslipidemia.  ------------------------------------------------------------ Study  Conclusions  - Left ventricle: The cavity size was mildly reduced. There was moderate concentric hypertrophy. Systolic function was normal. The estimated ejection fraction was in the range of 55% to 60%. Wall motion was normal; there were no regional wall motion abnormalities. Doppler parameters are consistent with abnormal left ventricular relaxation (grade 1 diastolic dysfunction). - Aortic valve: Trileaflet; severely calcified leaflets; fusion of the left-noncoronary commissure. Cusp separation was severely reduced. There was severe stenosis. Mild regurgitation directed eccentrically in the LVOT. Valve area: 0.73cm^2(VTI). Valve area: 0.73cm^2 (Vmax). - Mitral valve: Calcified annulus. Moderately calcified leaflets posterior. Mild regurgitation. Valve area by continuity equation (using LVOT flow): 1.93cm^2. - Left atrium: The atrium was mildly to moderately dilated. - Right ventricle: Systolic pressure was increased. - Atrial septum: No defect or patent foramen ovale was identified. - Pulmonary arteries: PA peak pressure: 31mm Hg (S). Impressions:  - severe-critical calcific AS with some leaflet openi9ng seen. Concern because of extent of calcification, small LV cavity with moderately -severe LVH. Transthoracic echocardiography. M-mode, complete 2D, spectral Doppler, and color Doppler. Height: Height: 165.1cm. Height: 65in. Weight: Weight: 59kg. Weight: 129.7lb. Body mass index: BMI: 21.6kg/m^2. Body surface area: BSA: 1.46m^2. Blood pressure: 110/70. Patient status: Outpatient. Location: Echo laboratory.  ------------------------------------------------------------  ------------------------------------------------------------ Left ventricle: The cavity size was mildly reduced. There was moderate-severe concentric hypertrophy. Systolic function was normal. The estimated ejection fraction was in the range of 55% to 60%. Wall motion was normal; there were no regional wall  motion abnormalities. Doppler parameters are consistent with abnormal left ventricular relaxation (grade 1 diastolic dysfunction).  ------------------------------------------------------------ Aortic valve: Trileaflet; severely calcified leaflets; fusion of the left-noncoronary commissure. Cusp separation was severely reduced. Doppler: There was severe stenosis. Mild regurgitation directed eccentrically in the LVOT. Valve area: 0.73cm^2(VTI). Indexed valve area: 0.44cm^2/m^2 (VTI). Valve area: 0.73cm^2 (Vmax). Indexed valve area: 0.44cm^2/m^2 (Vmax). Mean gradient: 36mm Hg (S). Peak gradient: 80mm Hg (S).  ------------------------------------------------------------ Aorta: The aorta was normal, not dilated, and non-diseased.  ------------------------------------------------------------ Mitral valve: Calcified annulus. Moderately calcified leaflets posterior. No echocardiographic evidence for prolapse. Doppler: There was no evidence for stenosis. Mild regurgitation. Valve area by pressure half-time: 2.62cm^2. Indexed valve area by pressure half-time: 1.59cm^2/m^2. Valve area by continuity equation (using LVOT flow): 1.93cm^2. Indexed valve area by continuity equation (using LVOT flow): 1.17cm^2/m^2. Mean gradient: 1mm Hg (D).  ------------------------------------------------------------ Left atrium: LA Volume/BSA 39.4 ml/m2. The atrium was mildly to moderately dilated.  ------------------------------------------------------------ Atrial septum: No defect or patent foramen ovale was identified.  ------------------------------------------------------------ Right ventricle: The cavity size was normal. Wall thickness was normal. Systolic function was normal. Systolic pressure was increased.  ------------------------------------------------------------ Pulmonic valve: Doppler: Trivial regurgitation.  ------------------------------------------------------------ Right atrium: The  atrium was at the upper limits of normal in size.  ------------------------------------------------------------ Pericardium: The pericardium was normal in appearance. There was no pericardial effusion.  ------------------------------------------------------------ Systemic veins: Inferior vena cava: The vessel was normal in size; the respirophasic diameter changes were in the normal range (= 50%); findings are consistent with normal  central venous pressure.  ------------------------------------------------------------ Post procedure conclusions Ascending Aorta:  - The aorta was normal, not dilated, and non-diseased.  ------------------------------------------------------------  2D measurements Normal Doppler measurements Normal Left ventricle Main pulmonary LVID ED, 41.8 mm 43-52 artery chord, Pressure, 31 mm Hg =30 PLAX S LVID ES, 21.8 mm 23-38 Left ventricle chord, Ea, lat 5.7 cm/s ------ PLAX ann, tiss FS, chord, 48 % >29 DP PLAX E/Ea, lat 11.1 ------ LVPW, ED 16.6 mm ------ ann, tiss 4 IVS/LVPW 0.99 <1.3 DP ratio, ED Ea, med 4.8 cm/s ------ Ventricular septum ann, tiss IVS, ED 16.4 mm ------ DP LVOT E/Ea, med 13.2 ------ Diam, S 20 mm ------ ann, tiss 3 Area 3.14 cm^2 ------ DP Aorta Aortic valve Root diam, 37 mm ------ Peak vel, 430 cm/s ------ ED S Left atrium Mean vel, 273 cm/s ------ AP dim 49 mm ------ S AP dim 2.97 cm/m^2 <2.2 VTI, S 123 cm ------ index Mean 36 mm Hg ------ gradient, S Peak 80 mm Hg ------ gradient, S Area, VTI 0.73 cm^2 ------ Area index 0.44 cm^2/m ------ (VTI) ^2 Area, Vmax 0.73 cm^2 ------ Area index 0.44 cm^2/m ------ (Vmax) ^2 Regurg PHT 776 ms ------ Mitral valve Peak E vel 63.5 cm/s ------ Peak A vel 79.9 cm/s ------ Mean vel, 46.4 cm/s ------ D Decelerati 406 ms 150-23 on time 0 Pressure 84 ms ------ half-time Mean 1 mm Hg ------ gradient, D Peak E/A 0.8 ------ ratio Area (PHT) 2.62 cm^2 ------ Area index  1.59 cm^2/m ------ (PHT) ^2 Area 1.93 cm^2 ------ (LVOT) continuity Area index 1.17 cm^2/m ------ (LVOT ^2 cont) Annulus 50 cm ------ VTI Tricuspid valve Regurg 253 cm/s ------ peak vel Peak RV-RA 26 mm Hg ------ gradient, S Systemic veins Estimated 5 mm Hg ------ CVP Right ventricle Pressure, 31 mm Hg <30 S Sa vel, 12.7 cm/s ------ lat ann, tiss DP  ------------------------------------------------------------ Prepared and Electronically Authenticated by  Susa Griffins, MD [facility] 2014-07-10T15:03:[id number]  CaINTERVENTIONAL CARDIOLOGIST: Marykay Lex, M.D., MS  PRIMARY CARE PROVIDER: Aura Dials, MD  PRIMARY CARDIOLOGIST: Susa Griffins, MD  PATIENT: Ralph Drake is a 78 y.o. male with history of known coronary disease and severe aortic valve stenosis by recent echocardiography. Ejection fraction was estimated 55-60% no regional wall motion abnormalities. His valve area was estimated to be 0.73cm2, Peak/Mean Gradients 80/36 mmHg.  He was seen by Dr. Tyrone Sage from CT surgery for pre-operative evaluation for aortic valve replacement, and is referred now for preop right and left heart catheterization.  PRE-OPERATIVE DIAGNOSIS:  Severe Aortic Stenosis  History of CAD s/p PTCA PROCEDURES PERFORMED:  Right & Left Heart Catheterization with Native Coronary Angiography via Right Radial Artery & Right Internal Jugular Vein Access.  Left Ventriculography Consent: The procedure with Risks/Benefits/Alternatives and Indications was reviewed with the patient (and family). All questions were answered.  PROCEDURE:  The patient was brought to the 2nd Floor [facility] in the fasting state and prepped and draped in the usual sterile fashion for Right groin or radial / brachial access. A modified Allen's test with plethysmography was performed on the right wrist demonstrating adequate Ulnar Artery collateral flow. Using sterile technique, a 18 gauge IV  was inserted in the Right antecubital vein and capped by the cath lab RN.  Sterile technique was used including antiseptics, cap, gloves, gown, hand hygiene, mask and sheet. Skin prep: Chlorhexidine;  Time Out: Verified patient identification, verified procedure, site/side was marked, verified correct patient position, special equipment/implants available, medications/allergies/relevent history reviewed,  required imaging and test results available. Performed  Right Heart Catheterization  The Right Neck visualized using direct Sonosite [place] to identify the Right Internal Jugular Vein (RIJV) Right Carotid Artery - images were captured. The site was anesthetized using ~66ml lidocaine. The RIJV was accessed under direct US guidance using the modified Seldinger technique, and a 7Fr sheath was inserted over the wire.  Then a 7 Fr Theone Murdoch Catheter was advanced through the sheath, and with the balloon inflated once in the SVC, was advanced under fluoroscopic guidance into the first the Right Atrium, then through the Right Ventricle into the Main Pulmonary Artery and into the Wedge position. Hemodynamics measurements were obtained in each location. Simultaneous Oxygen saturation were recorded in both the Pulmonary Artery and the Central Aorta. Simultaneous pressure measurements were obtained in the PA/PCWP and RV along with LV. Thermodilution injections were then performed to calculate Cardiac Output by Fick Calculation..  Then the catheter was removed completely out of the body with the balloon deflated. The Sheath was flushed with heparinized saline.  Left Heart Catheterization  The right wrist was anesthetized with 1% subcutaneous Lidocaine. The right radial artery was accessed using the Seldinger Technique with placement of a 5 Fr Glide Sheath. The sheath was aspirated and flushed. Then a total of 10 ml of standard Radial Artery Cocktail (see medications) was infused. Radial Cocktail: 5 mg Verapamil, 400 mcg NTG,  2 ml 2% Lidocaine in 10 ml NS. 5000 Units of IV Heparin was administered once the catheter reached the ascending aorta.  A 5 Fr TIG Catheter was advanced of over a Versicore wire into the ascending Aorta. The catheter was used to engage the Left and Right coronary artery. Multiple cineangiographic views of the Both coronary artery system(s) were performed.  The catheter was exchanged for a Gibson Ramp followed by a JR4 catheter that was finally used to cross the Aortic Valve. LV hemodynamics were measured and Left Ventriculography was performed using Hand injection. LV hemodynamics were then re-sampled, and the catheter was pulled back across the Aortic Valve for measurement of "pull-back" gradient.  Both sheaths were removed in the Cath Lab with a TR band placed at 37 ml Air at 1120 hours. Reverse Allen's test revealed non-occlusive hemostasis and manual pressure held for the venous access.  MEDICATIONS:  ANESTHESIA: Local Lidocaine 6 ml  SEDATION: 1 mg IV Versed, 25 mcg IV fentanyl ; Premedication: 5 mg oral Valium  Omnipaque contrast 75 mL  EBL: < 10ml, not including ABG and VBG samples  FINDINGS:  Hemodynamics:  Findings:   SaO2%  Pressures mmHg  Mean P  mmHg  EDP  mmHg  Peak to Peak  AoV area cm2  AoV index cm2 /m2   Right Atrium   6/5   3      Right Ventricle   31/3   7      Pulmonary Artery  49%  31/11  20       PCWP   9/9  9       Central Aortic  82% (on room air)  146/51  78       Left Ventricle   176/80   16      Aortic valve gradient    24.9   30  0.86 - 0.87  0.52             Cardiac Output:   Cardiac Index:       Fick  3.39   2.03  Thermodilution  3.41   2.04       Coronary Anatomy:  Left Main: Large-caliber vessel that trifurcates into the LAD, Ramus Intermedius, and Circumflex LAD: Moderate large-caliber vessel that courses down around the apex. It gives rise to 3 walk caliber diagonal branches. There are only minimal luminal irregularities throughout the entire system the   Left Circumflex: This moderate caliber vessel with proximal diffuse mildly calcified 30% stenoses prior to entering the AV groove. Once in the AV groove it gives rise to a very small first OM1 and the AV groove circumflex before terminating as a lateral OM2 that gives off a small OM2. Otherwise Minimal luminal irregularities  Ramus intermedius: Moderate caliber vessel coursing along the anterior lateral wall bifurcates into 2 small branches distally, reaches the apex. RCA: Large-caliber dominant vessel. After a substantial RV marginal marginal branch in the mid vessel, there is diffuse 30-50% calcified stenoses before the vessel bifurcates distally into the Right Posterior Descending Artery and the Right Posterior AV Groove vessel.  Right PDA: Moderate caliber vessel that bifurcates in the tandem system. The more medial branch has a much more robust branching system that actually provides some posterior lateral branches. Mild luminal irregularities.  Right Posterior AV Groove - Posterolateral System: Moderate caliber vessel that terminates as 2 small caliber right posterior lateral branches. Angiographically normal PATIENT DISPOSITION:  The patient was transferred to the PACU holding area in a hemodynamicaly stable, chest pain free condition.  The patient tolerated the procedure well, and there were no complications.  The patient was stable before, during, and after the procedure. POST-OPERATIVE DIAGNOSIS:  Echocardiographically Severe Aortic Stenosis, however catheterization data does not support the level severity without area of 0.86 cm and peak/mean gradient of 30/25 mmHg.  Mild to moderate calcified RCA circumflex proximal disease but no obstructive coronary disease.  Marked bradycardia with heart rates in the 40-44 beats a minute range, likely achieving to the moderate to severely reduced cardiac output and index are consistent by both Fick and thermodilution. PLAN OF CARE:  Due to borderline  renal function, will hydrate post catheter 5 hours. Otherwise standard post radial cath care with discharge but does have been.  He will followup with Dr. Tyrone Sage as scheduled tomorrow. I notified Dr. Alanda Amass of the results and will board the study report to both doctors Tyrone Sage and Alanda Amass.  I recommended the patient discontinue Bystolic due to his significant bradycardia. The patient and his wife were updated.  Marykay Lex, M.D., M.S.  [facility]  [address]  [place], [place] [address]  [phone]  07/02/2012  4:44 PM rdiac Cath:  Assessment / Plan:   Patient with known previous angioplasty for coronary occlusive disease now presents with symptomatic critical aortic stenosis with a aortic valve velocity of 4.5 cm/s, Peak grad 80 mean  grad  36 AVA .8 Cardiac cath has been done and reviewed   I have reviewed the patient's echocardiograms, history,  cath films and reviewing with he and his wife the diagnosis of critical aortic stenosis the options of treatment have recommended to him proceeding with aortic valve replacement Sept 2 . We discussed the use of a tissue valve to avoid long-term Coumadin. The goals risks and alternatives of the planned surgical procedure AVR   have been discussed with the patient in detail. The risks of the procedure including death, infection, stroke, myocardial infarction, bleeding, blood transfusion and heart block  have all been discussed specifically.  I have  quoted Donnajean Lopes a 4% of perioperative mortality and a complication rate as high as 25 %. The patient's questions have been answered.Ralph Drake is willing  to proceed with the planned procedure.  Delight Ovens MD      [address] [place] [place] [address] Office [phone]   Beeper [phone]  02/10/2013 7:14 AM

## 2013-02-10 NOTE — Preoperative (Signed)
Beta Blockers   Reason not to administer Beta Blockers:Hold  beta blocker due to Bradycardia (HR less than 50 bpm) 

## 2013-02-10 NOTE — Progress Notes (Signed)
Utilization Review Completed.Ralph Drake T9/07/2012  

## 2013-02-10 NOTE — Progress Notes (Signed)
  Echocardiogram Echocardiogram Transesophageal (OR) has been performed.  Ralph Drake 02/10/2013, 9:01 AM

## 2013-02-10 NOTE — Procedures (Signed)
**Note De-Identified Marky Buresh Obfuscation** Extubation Procedure Note  Patient Details:   Name: Ralph Drake DOB: 09/16/1934 MRN: [id number]   Airway Documentation:  Airway 8 mm (Active)  Secured at (cm) 22 cm 02/10/2013 12:48 PM  Measured From Lips 02/10/2013 12:48 PM  Secured Location Right 02/10/2013 12:48 PM  Secured By Caron Presume Tape 02/10/2013 12:48 PM    Evaluation  O2 sats: stable throughout Complications: No apparent complications Patient did tolerate procedure well. Bilateral Breath Sounds: Clear   Yes +leak, no stridor noted, parameters WNL NIF-40, VC 1.9 Itsel Opfer, Megan Salon 02/10/2013, 6:34 PM

## 2013-02-10 NOTE — Anesthesia Preprocedure Evaluation (Addendum)
Anesthesia Evaluation  Patient identified by MRN, date of birth, ID band Patient awake    Reviewed: Allergy & Precautions, H&P , NPO status , Patient's Chart, lab work & pertinent test results  History of Anesthesia Complications Negative for: history of anesthetic complications  Airway Mallampati: II TM Distance: >3 FB Neck ROM: Full    Dental  (+) Poor Dentition, Partial Upper and Dental Advisory Given   Pulmonary neg pulmonary ROS, shortness of breath and with exertion,    Pulmonary exam normal       Cardiovascular hypertension, Pt. on medications + CAD and + Peripheral Vascular Disease + Valvular Problems/Murmurs AS  Left ventricle: The cavity size was mildly reduced. There was moderate concentric hypertrophy. Systolic function was normal. The estimated ejection fraction was in the range of 55% to 60%. Wall motion was normal; there were no regional wall motion abnormalities.   Neuro/Psych negative neurological ROS  negative psych ROS   GI/Hepatic GERD-  Medicated,  Endo/Other  negative endocrine ROS  Renal/GU negative Renal ROS     Musculoskeletal   Abdominal   Peds  Hematology   Anesthesia Other Findings   Reproductive/Obstetrics                         Anesthesia Physical Anesthesia Plan  ASA: IV  Anesthesia Plan: General   Post-op Pain Management:    Induction: Intravenous  Airway Management Planned: Oral ETT  Additional Equipment: Arterial line, 3D TEE and PA Cath  Intra-op Plan:   Post-operative Plan: Post-operative intubation/ventilation  Informed Consent: I have reviewed the patients History and Physical, chart, labs and discussed the procedure including the risks, benefits and alternatives for the proposed anesthesia with the patient or authorized representative who has indicated his/her understanding and acceptance.   Dental advisory given  Plan Discussed with:  CRNA, Anesthesiologist and Surgeon  Anesthesia Plan Comments:        Anesthesia Quick Evaluation

## 2013-02-10 NOTE — Brief Op Note (Addendum)
      [address]       [place] [address]             [phone]        10:25 AM  PATIENT:  Ralph Drake  78 y.o. male  PRE-OPERATIVE DIAGNOSIS:  Aortic Stenosis  POST-OPERATIVE DIAGNOSIS:  Aortic Stenosis  PROCEDURE:  Procedure(s):  AORTIC VALVE REPLACEMENT (AVR) -23 mm Edwards Magna Ease Pericardial Tissue Valve  INTRAOPERATIVE TRANSESOPHAGEAL ECHOCARDIOGRAM (N/A)  SURGEON:  Surgeon(s) and Role:    * Delight Ovens, MD - Primary  PHYSICIAN ASSISTANT: Erin Barrett PA-C  ANESTHESIA:   general  EBL:  Total I/O In: 1000 [I.V.:1000] Out: 1400 [Urine:1400]  BLOOD ADMINISTERED: CELLSAVER  DRAINS: 2  mediastinal chest drains     SPECIMEN:  Source of Specimen:  Aortic Valve Leaflets  DISPOSITION OF SPECIMEN:  PATHOLOGY  COUNTS:  YES  DICTATION: .Dragon Dictation  PLAN OF CARE: Admit to inpatient   PATIENT DISPOSITION:  ICU - intubated and hemodynamically stable.   Delay start of Pharmacological VTE agent (>24hrs) due to surgical blood loss or risk of bleeding: yes

## 2013-02-11 ENCOUNTER — Inpatient Hospital Stay (HOSPITAL_COMMUNITY): Payer: Medicare Other

## 2013-02-11 ENCOUNTER — Encounter (HOSPITAL_COMMUNITY): Payer: Self-pay | Admitting: Cardiology

## 2013-02-11 DIAGNOSIS — I10 Essential (primary) hypertension: Secondary | ICD-10-CM

## 2013-02-11 DIAGNOSIS — E785 Hyperlipidemia, unspecified: Secondary | ICD-10-CM

## 2013-02-11 DIAGNOSIS — Z952 Presence of prosthetic heart valve: Secondary | ICD-10-CM | POA: Insufficient documentation

## 2013-02-11 DIAGNOSIS — R001 Bradycardia, unspecified: Secondary | ICD-10-CM | POA: Diagnosis present

## 2013-02-11 DIAGNOSIS — Z9861 Coronary angioplasty status: Secondary | ICD-10-CM

## 2013-02-11 DIAGNOSIS — I359 Nonrheumatic aortic valve disorder, unspecified: Principal | ICD-10-CM

## 2013-02-11 DIAGNOSIS — Z954 Presence of other heart-valve replacement: Secondary | ICD-10-CM

## 2013-02-11 DIAGNOSIS — I498 Other specified cardiac arrhythmias: Secondary | ICD-10-CM

## 2013-02-11 DIAGNOSIS — I2510 Atherosclerotic heart disease of native coronary artery without angina pectoris: Secondary | ICD-10-CM

## 2013-02-11 LAB — GLUCOSE, CAPILLARY
Glucose-Capillary: 101 mg/dL — ABNORMAL HIGH (ref 70–99)
Glucose-Capillary: 102 mg/dL — ABNORMAL HIGH (ref 70–99)
Glucose-Capillary: 106 mg/dL — ABNORMAL HIGH (ref 70–99)
Glucose-Capillary: 111 mg/dL — ABNORMAL HIGH (ref 70–99)
Glucose-Capillary: 114 mg/dL — ABNORMAL HIGH (ref 70–99)
Glucose-Capillary: 118 mg/dL — ABNORMAL HIGH (ref 70–99)
Glucose-Capillary: 124 mg/dL — ABNORMAL HIGH (ref 70–99)
Glucose-Capillary: 125 mg/dL — ABNORMAL HIGH (ref 70–99)
Glucose-Capillary: 127 mg/dL — ABNORMAL HIGH (ref 70–99)
Glucose-Capillary: 132 mg/dL — ABNORMAL HIGH (ref 70–99)
Glucose-Capillary: 132 mg/dL — ABNORMAL HIGH (ref 70–99)
Glucose-Capillary: 135 mg/dL — ABNORMAL HIGH (ref 70–99)
Glucose-Capillary: 142 mg/dL — ABNORMAL HIGH (ref 70–99)
Glucose-Capillary: 145 mg/dL — ABNORMAL HIGH (ref 70–99)
Glucose-Capillary: 146 mg/dL — ABNORMAL HIGH (ref 70–99)
Glucose-Capillary: 152 mg/dL — ABNORMAL HIGH (ref 70–99)
Glucose-Capillary: 160 mg/dL — ABNORMAL HIGH (ref 70–99)
Glucose-Capillary: 82 mg/dL (ref 70–99)
Glucose-Capillary: 97 mg/dL (ref 70–99)
Glucose-Capillary: 98 mg/dL (ref 70–99)

## 2013-02-11 LAB — CBC
HCT: 34.1 % — ABNORMAL LOW (ref 39.0–52.0)
Hemoglobin: 11.8 g/dL — ABNORMAL LOW (ref 13.0–17.0)
MCH: 31.5 pg (ref 26.0–34.0)
MCH: 31.8 pg (ref 26.0–34.0)
MCH: 32.1 pg (ref 26.0–34.0)
MCHC: 34.6 g/dL (ref 30.0–36.0)
MCHC: 35.5 g/dL (ref 30.0–36.0)
MCV: 90.9 fL (ref 78.0–100.0)
MCV: 89.5 fL (ref 78.0–100.0)
Platelets: 82 10*3/uL — ABNORMAL LOW (ref 150–400)
Platelets: 77 10*3/uL — ABNORMAL LOW (ref 150–400)
RDW: 13.6 % (ref 11.5–15.5)
WBC: 16.9 10*3/uL — ABNORMAL HIGH (ref 4.0–10.5)

## 2013-02-11 LAB — POCT I-STAT, CHEM 8
BUN: 22 mg/dL (ref 6–23)
Calcium, Ion: 1.25 mmol/L (ref 1.13–1.30)
Chloride: 102 mEq/L (ref 96–112)
Creatinine, Ser: 1.1 mg/dL (ref 0.50–1.35)
Glucose, Bld: 155 mg/dL — ABNORMAL HIGH (ref 70–99)
HCT: 33 % — ABNORMAL LOW (ref 39.0–52.0)
Hemoglobin: 11.2 g/dL — ABNORMAL LOW (ref 13.0–17.0)
Potassium: 4.2 mEq/L (ref 3.5–5.1)
Sodium: 134 mEq/L — ABNORMAL LOW (ref 135–145)
TCO2: 23 mmol/L (ref 0–100)

## 2013-02-11 LAB — CREATININE, SERUM
Creatinine, Ser: 1.07 mg/dL (ref 0.50–1.35)
GFR calc non Af Amer: 65 mL/min — ABNORMAL LOW (ref >90)

## 2013-02-11 LAB — BASIC METABOLIC PANEL
Calcium: 8.4 mg/dL (ref 8.4–10.5)
Creatinine, Ser: 0.94 mg/dL (ref 0.50–1.35)
GFR calc Af Amer: >90 mL/min (ref >90)

## 2013-02-11 LAB — MAGNESIUM
Magnesium: 2.3 mg/dL (ref 1.5–2.5)
Magnesium: 2.2 mg/dL (ref 1.5–2.5)

## 2013-02-11 MED ORDER — INSULIN ASPART 100 UNIT/ML ~~LOC~~ SOLN
0.0000 [IU] | SUBCUTANEOUS | Status: DC
  Administered 2013-02-11 (×2): 2 [IU] via SUBCUTANEOUS
  Administered 2013-02-11: 4 [IU] via SUBCUTANEOUS
  Administered 2013-02-11 – 2013-02-12 (×2): 2 [IU] via SUBCUTANEOUS

## 2013-02-11 MED ORDER — ALBUTEROL SULFATE (5 MG/ML) 0.5% IN NEBU: 2.5000 mg | INHALATION_SOLUTION | Freq: Four times a day (QID) | RESPIRATORY_TRACT | Status: DC | PRN

## 2013-02-11 MED ORDER — AMLODIPINE BESYLATE 5 MG PO TABS
5.0000 mg | ORAL_TABLET | Freq: Every day | ORAL | Status: DC
  Administered 2013-02-11 – 2013-02-17 (×7): 5 mg via ORAL
  Filled 2013-02-11 (×7): qty 1

## 2013-02-11 MED ORDER — LISINOPRIL 10 MG PO TABS
10.0000 mg | ORAL_TABLET | Freq: Every day | ORAL | Status: DC
  Administered 2013-02-11: 10 mg via ORAL
  Filled 2013-02-11 (×2): qty 1

## 2013-02-11 MED ORDER — ALBUTEROL SULFATE (5 MG/ML) 0.5% IN NEBU: 2.5000 mg | INHALATION_SOLUTION | Freq: Two times a day (BID) | RESPIRATORY_TRACT | Status: DC

## 2013-02-11 MED FILL — Sodium Chloride IV Soln 0.9%: INTRAVENOUS | Qty: 1000 | Status: AC

## 2013-02-11 MED FILL — Sodium Chloride Irrigation Soln 0.9%: Qty: 3000 | Status: AC

## 2013-02-11 MED FILL — Heparin Sodium (Porcine) Inj 1000 Unit/ML: INTRAMUSCULAR | Qty: 10 | Status: AC

## 2013-02-11 MED FILL — Mannitol IV Soln 20%: INTRAVENOUS | Qty: 500 | Status: AC

## 2013-02-11 MED FILL — Sodium Bicarbonate IV Soln 8.4%: INTRAVENOUS | Qty: 50 | Status: AC

## 2013-02-11 MED FILL — Lidocaine HCl IV Inj 20 MG/ML: INTRAVENOUS | Qty: 5 | Status: AC

## 2013-02-11 MED FILL — Electrolyte-R (PH 7.4) Solution: INTRAVENOUS | Qty: 5000 | Status: AC

## 2013-02-11 NOTE — Progress Notes (Signed)
   78 y/o man POD#1 s/p AVR (23 Magna Ease Pericardial Tissue Valve) for severe AS.    Subjective:  Sore, but no major complaints. Extubated last PM without difficulty  Objective:  Vital Signs in the last 24 hours: Temp:  [95.4 F (35.2 C)-98.6 F (37 C)] 98.2 F (36.8 C) (09/03 0715) Pulse Rate:  [78-92] 80 (09/03 0715) Resp:  [9-18] 13 (09/03 0715) BP: (91-156)/(67-90) 133/75 mmHg (09/03 0715) SpO2:  [94 %-100 %] 95 % (09/03 0733) Arterial Line BP: (94-203)/(60-90) 175/71 mmHg (09/03 0715) FiO2 (%):  [40 %-50 %] 40 % (09/02 1730) Weight:  [132 lb 4.4 oz (60 kg)-144 lb 6.4 oz (65.5 kg)] 144 lb 6.4 oz (65.5 kg) (09/03 0500)  Hemodynamic parameters for last 24 hours:  PAP: (21-54)/(12-35) 38/23 mmHg  CO: [2.3 L/min-5.8 L/min] 5.8 L/min  CI: [1.4 L/min/m2-3.5 L/min/m2] 3.5 L/min/m2   Intake/Output from previous day: 09/02 0701 - 09/03 0700 In: 6182.2 [I.V.:4402.2; Blood:730; IV Piggyback:1050] Out: 5535 [Urine:3885; Blood:1400; Chest Tube:250] Intake/Output from this shift: Total I/O In: 146.8 [I.V.:96.8; IV Piggyback:50] Out: 115 [Urine:85; Chest Tube:30]  Tele: A Paced @ 80 bpm  Physical Exam: General appearance: alert, cooperative, appears stated age and no distress Neck: no carotid bruit, no JVD, supple, symmetrical, trachea midline and RIJ sheat with RHC still in place Lungs: clear to auscultation bilaterally, normal percussion bilaterally and non-labored Heart: regular rate and rhythm, S1, S2 normal, systolic murmur: systolic ejection 1/6, crescendo and decrescendo at 2nd right intercostal space and friction rub heard LUSB Abdomen: soft, non-tender; bowel sounds normal; no masses,  no organomegaly Extremities: extremities normal, atraumatic, no cyanosis or edema Pulses: 2+ and symmetric Neurologic: Grossly normal  Lab Results:  Recent Labs  02/10/13 1840 02/10/13 1844 02/11/13 0320  WBC 16.6*  --  16.9*  HGB 11.8* 11.2* 11.2*  PLT 80*  --  82*    Recent  Labs  02/10/13 1844 02/11/13 0320  NA 139 138  K 4.9 4.1  CL 108 107  CO2  --  22  GLUCOSE 115* 133*  BUN 18 19  CREATININE 1.00 0.94   Imaging: CXR personally reviewed.  - no acute findings.  Cardiac Studies: Intra-OP TEE reviewed. (Severe AS confirmed) as was normal EF without WMA; Post AVR - no leak, normal valve function.  Assessment/Plan:  Principal Problem:   Aortic stenosis, severe Active Problems:   S/P AVR (aortic valve replacement)   CAD S/P percutaneous coronary angioplasty   Dyslipidemia, goal LDL below 70   Bradycardia - can only tolerate low dose BB  Progressing well s/p AVR - tubes & lines to be pulled today.   BP is somewhat elevated - was on moderate-high dose ACE-I & Amlodipine as OP.  - may be able to restart ACE-I (CCB already restarted this AM)  Due to significant bradycardia on 5mg  Bystolic as OP - requiring reduction in dose, would not up tittrate BB; ECG HR ~56 (with bifascicular block, old)  Back on statin + Zetia.  Standard post-op progression; mobilize today;  ? Transfer to tele soon.   LOS: 1 day   Billijo Dilling W 02/11/2013, 8:55 AM

## 2013-02-11 NOTE — Addendum Note (Signed)
Addendum created 02/11/13 0650 by Armandina Gemma   Modules edited: Anesthesia Medication Administration

## 2013-02-11 NOTE — Progress Notes (Signed)
Patient ID: Ralph Drake, male   DOB: 14-May-1935, 78 y.o.   MRN: [id number] [facility] DAILY ICU PROGRESS NOTE                   [address]            [place] [address]          [phone]   1 Day Post-Op Procedure(s) (LRB): AORTIC VALVE REPLACEMENT (AVR) (N/A) INTRAOPERATIVE TRANSESOPHAGEAL ECHOCARDIOGRAM (N/A)  Total Length of Stay:  LOS: 1 day   Subjective: Awake and alert, good pain control   Objective: Vital signs in last 24 hours: Temp:  [95.4 F (35.2 C)-98.6 F (37 C)] 98.2 F (36.8 C) (09/03 0715) Pulse Rate:  [78-92] 80 (09/03 0715) Cardiac Rhythm:  [-] Atrial paced (09/03 0400) Resp:  [9-18] 13 (09/03 0715) BP: (91-156)/(67-90) 133/75 mmHg (09/03 0715) SpO2:  [94 %-100 %] 95 % (09/03 0733) Arterial Line BP: (94-203)/(60-90) 175/71 mmHg (09/03 0715) FiO2 (%):  [40 %-50 %] 40 % (09/02 1730) Weight:  [132 lb 4.4 oz (60 kg)-144 lb 6.4 oz (65.5 kg)] 144 lb 6.4 oz (65.5 kg) (09/03 0500)  Filed Weights   02/09/13 1300 02/10/13 1300 02/11/13 0500  Weight: 133 lb 9.6 oz (60.6 kg) 132 lb 4.4 oz (60 kg) 144 lb 6.4 oz (65.5 kg)    Weight change: -1 lb 5.2 oz (-0.6 kg)   Hemodynamic parameters for last 24 hours: PAP: (21-54)/(12-35) 38/23 mmHg CO:  [2.3 L/min-5.8 L/min] 5.8 L/min CI:  [1.4 L/min/m2-3.5 L/min/m2] 3.5 L/min/m2  Intake/Output from previous day: 09/02 0701 - 09/03 0700 In: 6182.2 [I.V.:4402.2; Blood:730; IV Piggyback:1050] Out: 5535 [Urine:3885; Blood:1400; Chest Tube:250]  Intake/Output this shift: Total I/O In: 99.7 [I.V.:49.7; IV Piggyback:50] Out: 60 [Urine:30; Chest Tube:30]  Current Meds: Scheduled Meds: . acetaminophen  1,000 mg Oral Q6H   Or  . acetaminophen (TYLENOL) oral liquid 160 mg/5 mL  1,000 mg Per Tube Q6H  . albuterol  2.5 mg Nebulization Q6H  . aspirin EC  325 mg Oral Daily   Or  . aspirin  324 mg Per Tube Daily  . atorvastatin  40 mg Oral Daily  . bisacodyl  10 mg Oral Daily   Or  . bisacodyl  10 mg  Rectal Daily  . cefUROXime (ZINACEF)  IV  1.5 g Intravenous Q12H  . docusate sodium  200 mg Oral Daily  . ezetimibe  10 mg Oral Daily  . famotidine (PEPCID) IV  20 mg Intravenous Q12H  . insulin regular  0-10 Units Intravenous TID WC  . metoprolol tartrate  12.5 mg Oral BID   Or  . metoprolol tartrate  12.5 mg Per Tube BID  . [START ON 02/12/2013] pantoprazole  40 mg Oral Daily  . sodium chloride  3 mL Intravenous Q12H   Continuous Infusions: . sodium chloride 20 mL/hr at 02/10/13 1245  . sodium chloride Stopped (02/11/13 0000)  . sodium chloride    . dexmedetomidine Stopped (02/10/13 1730)  . insulin (NOVOLIN-R) infusion 2.8 Units/hr (02/11/13 0703)  . lactated ringers 20 mL/hr at 02/10/13 1245  . nitroGLYCERIN 25 mcg/min (02/11/13 0703)  . phenylephrine (NEO-SYNEPHRINE) Adult infusion Stopped (02/10/13 1245)   PRN Meds:.albumin human, metoprolol, midazolam, morphine injection, ondansetron (ZOFRAN) IV, oxyCODONE, sodium chloride  General appearance: alert and cooperative Neurologic: intact Heart: normal apical impulse, regular rate and rhythm and friction rub heard precordium Lungs: clear to auscultation bilaterally Abdomen: soft, non-tender; bowel sounds normal; no masses,  no organomegaly  Extremities: extremities normal, atraumatic, no cyanosis or edema and Homans sign is negative, no sign of DVT Wound: dressing intact  Lab Results: CBC: Recent Labs  02/10/13 1840 02/10/13 1844 02/11/13 0320  WBC 16.6*  --  16.9*  HGB 11.8* 11.2* 11.2*  HCT 33.2* 33.0* 31.5*  PLT 80*  --  82*   BMET:  Recent Labs  02/10/13 1844 02/11/13 0320  NA 139 138  K 4.9 4.1  CL 108 107  CO2  --  22  GLUCOSE 115* 133*  BUN 18 19  CREATININE 1.00 0.94  CALCIUM  --  8.4    PT/INR:  Recent Labs  02/10/13 1300  LABPROT 17.1*  INR 1.43   Radiology: Dg Chest Portable 1 View In Am  02/11/2013   *RADIOLOGY REPORT*  Clinical Data: Aortic valve replacement.  PORTABLE CHEST - 1 VIEW   Comparison: 02/10/2013  Findings: Swan-Ganz catheter has been slightly pulled back and it is in the proximal right pulmonary artery region.  Nasogastric tube and endotracheal tube have been removed.  Again noted is an aortic valve replacement.  The lungs are clear without pulmonary edema and no clear evidence for a pneumothorax.  Heart size is stable. Trachea is midline. There appears to be a mediastinal drain present.  IMPRESSION: No acute chest findings.  Support apparatuses as described.  No evidence for a pneumothorax.   Original Report Authenticated By: Richarda Overlie, M.D.   Dg Chest Portable 1 View  02/10/2013   CLINICAL DATA:  78 year old male status post CABG.  EXAM: PORTABLE CHEST - 1 VIEW  COMPARISON:  A 2014 and earlier.  FINDINGS: Portable spine AP view at 1252 hrs.  Endotracheal tube tip just below the level the clavicles. Enteric tube courses to the abdomen, tip not included. Right IJ approach Swan-Ganz catheter, tip at the level of the right main pulmonary artery. Mediastinal drain. Epicardial pacer wires.  Sequelae of median sternotomy and cardiac valve replacement. No pneumothorax. No pulmonary edema or pleural effusion. Mild retrocardiac atelectasis. Stable cardiac size and mediastinal contours.  IMPRESSION: 1. Lines and tubes appear appropriately placed as above.  2.  Mild atelectasis.   Electronically Signed   By: Augusto Gamble   On: 02/10/2013 13:01     Assessment/Plan: S/P Procedure(s) (LRB): AORTIC VALVE REPLACEMENT (AVR) (N/A) INTRAOPERATIVE TRANSESOPHAGEAL ECHOCARDIOGRAM (N/A) Mobilize d/c tubes/lines Continue foley due to strict I&O and urinary output monitoring See progression orders Thrombocytopenia- avoid heparin        [name] 02/11/2013 7:58 AM

## 2013-02-11 NOTE — Progress Notes (Signed)
Patient examined and record reviewed.Hemodynamics stable,labs satisfactory.Patient had stable day.Continue current care. VAN TRIGT III,PETER 02/11/2013

## 2013-02-11 NOTE — Plan of Care (Signed)
Problem: Phase I Progression Outcomes Goal: Initial discharge plan identified Outcome: Completed/Met Date Met:  02/11/13 Home with wife

## 2013-02-12 ENCOUNTER — Inpatient Hospital Stay (HOSPITAL_COMMUNITY): Payer: Medicare Other

## 2013-02-12 ENCOUNTER — Encounter (HOSPITAL_COMMUNITY): Payer: Self-pay | Admitting: Cardiothoracic Surgery

## 2013-02-12 LAB — GLUCOSE, CAPILLARY
Glucose-Capillary: 160 mg/dL — ABNORMAL HIGH (ref 70–99)
Glucose-Capillary: 112 mg/dL — ABNORMAL HIGH (ref 70–99)
Glucose-Capillary: 129 mg/dL — ABNORMAL HIGH (ref 70–99)
Glucose-Capillary: 120 mg/dL — ABNORMAL HIGH (ref 70–99)
Glucose-Capillary: 143 mg/dL — ABNORMAL HIGH (ref 70–99)
Glucose-Capillary: 139 mg/dL — ABNORMAL HIGH (ref 70–99)

## 2013-02-12 LAB — BASIC METABOLIC PANEL
BUN: 18 mg/dL (ref 6–23)
CO2: 26 mEq/L (ref 19–32)
Calcium: 9 mg/dL (ref 8.4–10.5)
Chloride: 103 mEq/L (ref 96–112)
Creatinine, Ser: 0.95 mg/dL (ref 0.50–1.35)
GFR calc Af Amer: >90 mL/min (ref >90)
GFR calc non Af Amer: 79 mL/min — ABNORMAL LOW (ref >90)
Glucose, Bld: 122 mg/dL — ABNORMAL HIGH (ref 70–99)
Potassium: 4.4 mEq/L (ref 3.5–5.1)
Sodium: 136 mEq/L (ref 135–145)

## 2013-02-12 LAB — CBC
HCT: 30.8 % — ABNORMAL LOW (ref 39.0–52.0)
Hemoglobin: 10.7 g/dL — ABNORMAL LOW (ref 13.0–17.0)
MCH: 31.8 pg (ref 26.0–34.0)
MCHC: 34.7 g/dL (ref 30.0–36.0)
MCV: 91.7 fL (ref 78.0–100.0)
Platelets: 71 10*3/uL — ABNORMAL LOW (ref 150–400)
RBC: 3.36 MIL/uL — ABNORMAL LOW (ref 4.22–5.81)
RDW: 14.5 % (ref 11.5–15.5)
WBC: 14.4 10*3/uL — ABNORMAL HIGH (ref 4.0–10.5)

## 2013-02-12 MED ORDER — INSULIN ASPART 100 UNIT/ML ~~LOC~~ SOLN
0.0000 [IU] | Freq: Three times a day (TID) | SUBCUTANEOUS | Status: DC
  Administered 2013-02-12 (×2): 2 [IU] via SUBCUTANEOUS

## 2013-02-12 MED ORDER — SODIUM CHLORIDE 0.9 % IJ SOLN
3.0000 mL | Freq: Two times a day (BID) | INTRAMUSCULAR | Status: DC
  Administered 2013-02-12 – 2013-02-17 (×8): 3 mL via INTRAVENOUS

## 2013-02-12 MED ORDER — SODIUM CHLORIDE 0.9 % IJ SOLN
3.0000 mL | INTRAMUSCULAR | Status: DC | PRN
  Administered 2013-02-16: 3 mL via INTRAVENOUS

## 2013-02-12 MED ORDER — ONDANSETRON HCL 4 MG/2ML IJ SOLN
4.0000 mg | Freq: Four times a day (QID) | INTRAMUSCULAR | Status: DC | PRN
  Filled 2013-02-12: qty 2

## 2013-02-12 MED ORDER — ONDANSETRON HCL 4 MG PO TABS: 4.0000 mg | ORAL_TABLET | Freq: Four times a day (QID) | ORAL | Status: DC | PRN

## 2013-02-12 MED ORDER — MOVING RIGHT ALONG BOOK
Freq: Once | Status: DC
  Filled 2013-02-12: qty 1

## 2013-02-12 MED ORDER — SODIUM CHLORIDE 0.9 % IJ SOLN: 3.0000 mL | INTRAMUSCULAR | Status: DC | PRN

## 2013-02-12 MED ORDER — MOVING RIGHT ALONG BOOK
Freq: Once | Status: AC
  Administered 2013-02-12: 09:00:00
  Filled 2013-02-12: qty 1

## 2013-02-12 MED ORDER — OXYCODONE HCL 5 MG PO TABS: 5.0000 mg | ORAL_TABLET | ORAL | Status: DC | PRN

## 2013-02-12 MED ORDER — SODIUM CHLORIDE 0.9 % IV SOLN: 250.0000 mL | INTRAVENOUS | Status: DC | PRN

## 2013-02-12 MED ORDER — LISINOPRIL 20 MG PO TABS
20.0000 mg | ORAL_TABLET | Freq: Every day | ORAL | Status: DC
  Administered 2013-02-12 – 2013-02-17 (×6): 20 mg via ORAL
  Filled 2013-02-12 (×6): qty 1

## 2013-02-12 MED ORDER — TRAMADOL HCL 50 MG PO TABS: 50.0000 mg | ORAL_TABLET | ORAL | Status: DC | PRN

## 2013-02-12 MED ORDER — SODIUM CHLORIDE 0.9 % IJ SOLN: 3.0000 mL | Freq: Two times a day (BID) | INTRAMUSCULAR | Status: DC

## 2013-02-12 MED ORDER — BISACODYL 10 MG RE SUPP: 10.0000 mg | Freq: Every day | RECTAL | Status: DC | PRN

## 2013-02-12 MED ORDER — ASPIRIN EC 325 MG PO TBEC
325.0000 mg | DELAYED_RELEASE_TABLET | Freq: Every day | ORAL | Status: DC
  Administered 2013-02-12 – 2013-02-17 (×6): 325 mg via ORAL
  Filled 2013-02-12 (×6): qty 1

## 2013-02-12 MED ORDER — PANTOPRAZOLE SODIUM 40 MG PO TBEC
40.0000 mg | DELAYED_RELEASE_TABLET | Freq: Every day | ORAL | Status: DC
  Administered 2013-02-13 – 2013-02-17 (×5): 40 mg via ORAL
  Filled 2013-02-12 (×7): qty 1

## 2013-02-12 MED ORDER — BISACODYL 5 MG PO TBEC
10.0000 mg | DELAYED_RELEASE_TABLET | Freq: Every day | ORAL | Status: DC | PRN
  Filled 2013-02-12: qty 2

## 2013-02-12 MED ORDER — DOCUSATE SODIUM 100 MG PO CAPS
200.0000 mg | ORAL_CAPSULE | Freq: Every day | ORAL | Status: DC
  Administered 2013-02-12 – 2013-02-17 (×6): 200 mg via ORAL
  Filled 2013-02-12 (×6): qty 2

## 2013-02-12 MED ORDER — ACETAMINOPHEN 325 MG PO TABS
650.0000 mg | ORAL_TABLET | Freq: Four times a day (QID) | ORAL | Status: DC | PRN
Start: 2013-02-12 — End: 2013-02-17

## 2013-02-12 MED ORDER — METOPROLOL TARTRATE 12.5 MG HALF TABLET
12.5000 mg | ORAL_TABLET | Freq: Two times a day (BID) | ORAL | Status: DC
  Administered 2013-02-12 – 2013-02-13 (×3): 12.5 mg via ORAL
  Filled 2013-02-12 (×4): qty 1

## 2013-02-12 MED FILL — Potassium Chloride Inj 2 mEq/ML: INTRAVENOUS | Qty: 40 | Status: AC

## 2013-02-12 MED FILL — Magnesium Sulfate Inj 50%: INTRAMUSCULAR | Qty: 10 | Status: AC

## 2013-02-12 MED FILL — Sodium Chloride IV Soln 0.9%: INTRAVENOUS | Qty: 1000 | Status: AC

## 2013-02-12 MED FILL — Heparin Sodium (Porcine) Inj 1000 Unit/ML: INTRAMUSCULAR | Qty: 30 | Status: AC

## 2013-02-12 NOTE — Progress Notes (Signed)
   78 y/o man POD#2 s/p AVR (23 Magna Ease Pericardial Tissue Valve) for severe AS.    Subjective:  Sore, but no major complaints. Low grade fever  Objective:  Vital Signs in the last 24 hours: Temp:  [98.2 F (36.8 C)-99.1 F (37.3 C)] 98.6 F (37 C) (09/04 0735) Pulse Rate:  [79-87] 87 (09/04 0600) Resp:  [11-18] 16 (09/04 0600) BP: (131-177)/(63-79) 147/67 mmHg (09/04 0600) SpO2:  [88 %-100 %] 94 % (09/04 0600) Arterial Line BP: (187)/(70) 187/70 mmHg (09/03 0900)  Hemodynamic parameters for last 24 hours:  PAP: (35-37)/(21-23) 37/23 mmHg  Intake/Output from previous day: 09/03 0701 - 09/04 0700 In: 718.6 [I.V.:568.6; IV Piggyback:150] Out: 2330 [Urine:2300; Chest Tube:30] Intake/Output from this shift:   Tele: A Paced @ 80 bpm  Physical Exam: General appearance: alert, cooperative, appears stated age and no distress Neck: no carotid bruit, no JVD, supple, symmetrical, trachea midline and RIJ sheat with RHC still in place Lungs: clear to auscultation bilaterally, normal percussion bilaterally and non-labored Heart: regular rate and rhythm, S1, S2 normal, systolic murmur: systolic ejection 1/6, crescendo and decrescendo at 2nd right intercostal space and friction rub heard LUSB Abdomen: soft, non-tender; bowel sounds normal; no masses,  no organomegaly Extremities: extremities normal, atraumatic, no cyanosis or edema Neurologic: Grossly normal  Lab Results:  Recent Labs  02/11/13 1600 02/11/13 1646 02/12/13 0403  WBC 18.8*  --  14.4*  HGB 10.9* 11.2* 10.7*  PLT 77*  --  71*    Recent Labs  02/11/13 0320  02/11/13 1646 02/12/13 0403  NA 138  --  134* 136  K 4.1  --  4.2 4.4  CL 107  --  102 103  CO2 22  --   --  26  GLUCOSE 133*  --  155* 122*  BUN 19  --  22 18  CREATININE 0.94  < > 1.10 0.95  < > = values in this interval not displayed. Imaging: CXR personally reviewed.  - no acute findings.  Cardiac Studies: Intra-OP TEE reviewed. (Severe AS  confirmed) as was normal EF without WMA; Post AVR - no leak, normal valve function.  Assessment/Plan:  Principal Problem:   Aortic stenosis, severe Active Problems:   S/P AVR (aortic valve replacement)   CAD S/P percutaneous coronary angioplasty   Dyslipidemia, goal LDL below 70   Bradycardia - can only tolerate low dose BB  Progressing well s/p AVR - tubes & lines to be pulled today.   BP is improved - essentially back on home CCB & ACE-I.    Due to significant bradycardia on 5mg  Bystolic as OP - requiring reduction in dose, would not up tittrate BB; ECG HR ~56 (with bifascicular block, old)  Back on statin + Zetia.  Standard post-op progression; mobilize today;  ? Transfer to tele soon.   LOS: 2 days   Timothee Gali W 02/12/2013, 8:05 AM

## 2013-02-12 NOTE — Progress Notes (Signed)
Per report, pt ambulated twice on 2300, and then ambulated during his transfer to 2000. Pt tolerated well. He has completed his 3 walks for today.

## 2013-02-12 NOTE — Progress Notes (Addendum)
      [address]       [place] [address]             [phone]      2 Days Post-Op Procedure(s) (LRB): AORTIC VALVE REPLACEMENT (AVR) (N/A) INTRAOPERATIVE TRANSESOPHAGEAL ECHOCARDIOGRAM (N/A)  Subjective:  Mr. Bargo is without complaints this morning.  States he feels pretty good. No BM  Objective: Vital signs in last 24 hours: Temp:  [98.1 F (36.7 C)-99.1 F (37.3 C)] 98.6 F (37 C) (09/04 0735) Pulse Rate:  [79-87] 87 (09/04 0600) Cardiac Rhythm:  [-] Normal sinus rhythm (09/04 0700) Resp:  [11-18] 16 (09/04 0600) BP: (131-177)/(63-79) 147/67 mmHg (09/04 0600) SpO2:  [88 %-100 %] 94 % (09/04 0600) Arterial Line BP: (183-187)/(66-70) 187/70 mmHg (09/03 0900)  Hemodynamic parameters for last 24 hours: PAP: (35-37)/(21-23) 37/23 mmHg  Intake/Output from previous day: 09/03 0701 - 09/04 0700 In: 718.6 [I.V.:568.6; IV Piggyback:150] Out: 2330 [Urine:2300; Chest Tube:30]  General appearance: alert, cooperative and no distress Heart: regular rate and rhythm Lungs: diminished breath sounds Left Base Abdomen: soft, non-tender; bowel sounds normal; no masses,  no organomegaly Extremities: edema trace Wound: clean and dry, will remove sternotomy dressing tomorrow  Lab Results:  Recent Labs  02/11/13 1600 02/11/13 1646 02/12/13 0403  WBC 18.8*  --  14.4*  HGB 10.9* 11.2* 10.7*  HCT 30.7* 33.0* 30.8*  PLT 77*  --  71*   BMET:  Recent Labs  02/11/13 0320  02/11/13 1646 02/12/13 0403  NA 138  --  134* 136  K 4.1  --  4.2 4.4  CL 107  --  102 103  CO2 22  --   --  26  GLUCOSE 133*  --  155* 122*  BUN 19  --  22 18  CREATININE 0.94  < > 1.10 0.95  CALCIUM 8.4  --   --  9.0  < > = values in this interval not displayed.  PT/INR:  Recent Labs  02/10/13 1300  LABPROT 17.1*  INR 1.43   ABG    Component Value Date/Time   PHART 7.299* 02/10/2013 1942   HCO3 22.2 02/10/2013 1942   TCO2 23 02/11/2013 1646   ACIDBASEDEF 4.0* 02/10/2013 1942     O2SAT 100.0 02/10/2013 1942   CBG (last 3)   Recent Labs  02/11/13 1941 02/11/13 2303 02/12/13 0405  GLUCAP 160* 132* 112*    Assessment/Plan: S/P Procedure(s) (LRB): AORTIC VALVE REPLACEMENT (AVR) (N/A) INTRAOPERATIVE TRANSESOPHAGEAL ECHOCARDIOGRAM (N/A)  1. CV-NSR, remains hypertensive- will continue Norvasc, Lopressor, and increase Lisinopril to home dose 2. Pulm- some left basilar atelectasis vs small effusion, wean oxygen as tolerated, continue nebs, IS 3. Renal- creatinine WNL, lytes okay- volume overloaded will start Lasix 4. Febrile, Leukocytosis stable- likely SIRS/Atelectasis will monitor 5. CBGs controlled, borderline A1c, continue coverage for now 6. Expected Acute Blood Loss Anemia- mild Hgb stable at 10.7 7. DIspo- patient doing well, will transfer to 2W today  LOS: 2 days    BARRETT, ERIN 02/12/2013  I have seen and examined Donnajean Lopes and agree with the above assessment  and plan.  Delight Ovens MD Beeper [phone] Office [phone] 02/12/2013 8:05 AM

## 2013-02-12 NOTE — Op Note (Signed)
Ralph Drake, Ralph Drake NO.:  [id number]  MEDICAL RECORD NO.:  [id number]  LOCATION:  2S07C                        FACILITY:  [facility]  PHYSICIAN:  Sheliah Plane, MD    DATE OF BIRTH:  [birth year]  DATE OF PROCEDURE:  02/10/2013 DATE OF DISCHARGE:                              OPERATIVE REPORT   PREOPERATIVE DIAGNOSIS:  Critical aortic stenosis.  POSTOPERATIVE DIAGNOSIS:  Critical aortic stenosis.  PROCEDURE PERFORMED:  Aortic valve replacement with pericardial tissue valve, Edwards Lifescience, model 3300TFX, 23 mm, serial #[id number].  SURGEON:  Sheliah Plane, MD  FIRST ASSISTANT:  Hendricks Limes, PA  BRIEF HISTORY:  The patient is a 78 year old male, long-term smoker, who presents with increasing shortness of breath.  He has been followed by Dr. Donata Clay for several years with known aortic murmur and evidence of aortic stenosis.  Because of worsening symptoms, repeat echo confirmed high-grade aortic stenosis with calculated valve area of 0.7.  The patient previously had coronary occlusive disease, treated with stent. Repeat cardiac catheterization showed luminal irregularities, but no high-grade coronary lesions.  Because of the patient's increasing symptoms and critical aortic stenosis, aortic valve replacement was recommended to the patient who agreed and signed informed consent.  DESCRIPTION OF PROCEDURE:  With Swan-Ganz and arterial line monitors in place, the patient underwent general endotracheal anesthesia without incident.  The skin of chest and legs were prepped with Betadine and draped in usual sterile manner.  A TEE probe had been placed by Dr. Krista Blue, confirming highly calcified aortic valve and trace mitral regurgitation and overall preserved LV function.  Full details in separate report.  After appropriate time-out, a median sternotomy was performed.  Pericardium was opened.  The patient's aorta was of normal size.  He was systemically  heparinized.  Ascending aorta was cannulated. The right atrium was cannulated.  Retrograde cardioplegia catheter was placed and position confirmed feel and by echo.  The patient was then placed on cardiopulmonary bypass 2.4 L/minute/m2. The body temperature was cooled to 32 degrees.  Aortic cross-clamp was applied. A 500 mL cold blood potassium cardioplegia was administered antegrade. Additional retrograde plegia was also administered.  With the myocardial septal temperature  monitored throughout crossclamp.  The transverse aortotomy was performed.  This gave good visualization of the tricuspid highly calcified aortic valve which was excised and the annulus debrided.  The annulus sized for a 23-mm tissue valve.  Care was taken to remove all loose calcific debris both the right and left coronary ostium were easily identifiable.  A #2 Ti-Cron pledgeted sutures in circumferential manner were placed with the pledgets on the ventricular surface, 15 sutures total.  These were used to secure the valve in place which seated well.  Intermittently, retrograde cardioplegia was administered.  Aortotomy was then closed with horizontal mattress 4-0 Prolene suture felt strips.  Prior to complete closure, the heart was allowed to passively fill and de-air maneuvers were carried out.  The remainder of the suture line was completed with a second over-and-over running 4-0 Prolene.  The aortic crossclamp was then removed with total cross-clamp time of 90 minutes. An 18-gauge needle was introduced into the left ventricular apex to further de-air the  heart.  With the patient, the patient transiently required DDD pacing for heart block, but this resolved before completion the case and he was atrially paced only to increase rate.  With the patient's body temperature rewarmed to 37 degrees and TEE showing good myocardial function and functioning aortic valve prosthesis.  The right superior pulmonary vein vent was  removed.  The patient was then ventilated and weaned from cardiopulmonary bypass without difficulty. He remained hemodynamically stable, was decannulated in usual fashion. Total pump time was 114 minutes.  With operative field hemostatic, 2 Blake mediastinal drains were left in place.  Pericardium was loosely reapproximated.  Sternum closed with #6 stainless steel wire.  Fascia was closed with interrupted 0 Vicryl, running 3-0 Vicryl in subcutaneous tissue, 4-0 subcuticular stitch in the skin edges.  Dry dressings were applied.  Sponge and needle count was reported as correct at the completion of procedure.  The patient tolerated the procedure without obvious complication.  He was transferred to Surgical Intensive Care Unit.  He did not require any blood products during the procedure.     Sheliah Plane, MD     EG/MEDQ  D:  02/11/2013  T:  02/11/2013  Job:  [id number]  cc:   Kerin Perna, M.D.

## 2013-02-12 NOTE — Progress Notes (Signed)
Pt t/x to 2W23 via wheelchair on monitor without event. Pt's wife present during transfer. Bedside report given to [employer] and pt hooked up to telemetry prior to my arrival.   Felipa Emory

## 2013-02-13 ENCOUNTER — Inpatient Hospital Stay (HOSPITAL_COMMUNITY): Payer: Medicare Other

## 2013-02-13 DIAGNOSIS — I4510 Unspecified right bundle-branch block: Secondary | ICD-10-CM | POA: Diagnosis present

## 2013-02-13 LAB — BASIC METABOLIC PANEL
BUN: 19 mg/dL (ref 6–23)
CO2: 28 mEq/L (ref 19–32)
Calcium: 9.5 mg/dL (ref 8.4–10.5)
Chloride: 99 mEq/L (ref 96–112)
Creatinine, Ser: 0.93 mg/dL (ref 0.50–1.35)
GFR calc Af Amer: >90 mL/min (ref >90)
GFR calc non Af Amer: 80 mL/min — ABNORMAL LOW (ref >90)
Glucose, Bld: 135 mg/dL — ABNORMAL HIGH (ref 70–99)
Potassium: 4.4 mEq/L (ref 3.5–5.1)
Sodium: 135 mEq/L (ref 135–145)

## 2013-02-13 LAB — CBC
HCT: 31.6 % — ABNORMAL LOW (ref 39.0–52.0)
Hemoglobin: 10.8 g/dL — ABNORMAL LOW (ref 13.0–17.0)
MCH: 31.6 pg (ref 26.0–34.0)
MCHC: 34.2 g/dL (ref 30.0–36.0)
MCV: 92.4 fL (ref 78.0–100.0)
Platelets: 83 10*3/uL — ABNORMAL LOW (ref 150–400)
RBC: 3.42 MIL/uL — ABNORMAL LOW (ref 4.22–5.81)
RDW: 14.8 % (ref 11.5–15.5)
WBC: 15.2 10*3/uL — ABNORMAL HIGH (ref 4.0–10.5)

## 2013-02-13 LAB — GLUCOSE, CAPILLARY
Glucose-Capillary: 118 mg/dL — ABNORMAL HIGH (ref 70–99)
Glucose-Capillary: 117 mg/dL — ABNORMAL HIGH (ref 70–99)

## 2013-02-13 MED ORDER — METOPROLOL TARTRATE 25 MG PO TABS
25.0000 mg | ORAL_TABLET | Freq: Two times a day (BID) | ORAL | Status: DC
  Administered 2013-02-13 – 2013-02-17 (×8): 25 mg via ORAL
  Filled 2013-02-13 (×10): qty 1

## 2013-02-13 MED ORDER — POTASSIUM CHLORIDE CRYS ER 20 MEQ PO TBCR
20.0000 meq | EXTENDED_RELEASE_TABLET | Freq: Every day | ORAL | Status: DC
  Administered 2013-02-14 – 2013-02-15 (×2): 20 meq via ORAL
  Filled 2013-02-13 (×2): qty 1

## 2013-02-13 MED ORDER — FUROSEMIDE 40 MG PO TABS
40.0000 mg | ORAL_TABLET | Freq: Every day | ORAL | Status: DC
  Administered 2013-02-13 – 2013-02-17 (×5): 40 mg via ORAL
  Filled 2013-02-13 (×5): qty 1

## 2013-02-13 NOTE — Progress Notes (Signed)
Pt ambulated 550 feet pushing wheelchair on RA; sats down to 86-88% while ambulating on RA; pt to chair in room after ambulation; O2 3L [place] applied; sats up to 92-93%; will cont. To monitor.

## 2013-02-13 NOTE — Plan of Care (Signed)
Problem: Phase III Progression Outcomes Goal: Transfer to PCTU/Telemetry POD Outcome: Completed/Met Date Met:  02/13/13 POD 2     

## 2013-02-13 NOTE — Progress Notes (Addendum)
      [address]       [place] [address]             [phone]        3 Days Post-Op Procedure(s) (LRB): AORTIC VALVE REPLACEMENT (AVR) (N/A) INTRAOPERATIVE TRANSESOPHAGEAL ECHOCARDIOGRAM (N/A)  Subjective: Patient sitting in chair. He is passing flatus but no bowel movement yet.  Objective: Vital signs in last 24 hours: Temp:  [98.3 F (36.8 C)-98.9 F (37.2 C)] 98.9 F (37.2 C) (09/05 0522) Pulse Rate:  [76-110] 110 (09/05 0959) Cardiac Rhythm:  [-] Normal sinus rhythm (09/05 0840) Resp:  [14-19] 18 (09/05 0522) BP: (105-158)/(58-80) 151/80 mmHg (09/05 0959) SpO2:  [88 %-94 %] 88 % (09/05 0959) Weight:  [63.8 kg (140 lb 10.5 oz)] 63.8 kg (140 lb 10.5 oz) (09/05 0500)  Pre op weight  60.6 kg Current Weight  02/13/13 63.8 kg (140 lb 10.5 oz)      Intake/Output from previous day: 09/04 0701 - 09/05 0700 In: 20 [I.V.:20] Out: 745 [Urine:745]   Physical Exam:  Cardiovascular: RRR Pulmonary: Diminished at bases bilaterally; no rales, wheezes, or rhonchi. Abdomen: Soft, non tender, bowel sounds present. Extremities: Mild bilateral lower extremity edema. Wound: Clean and dry.  No erythema or signs of infection.  Lab Results: CBC: Recent Labs  02/12/13 0403 02/13/13 0525  WBC 14.4* 15.2*  HGB 10.7* 10.8*  HCT 30.8* 31.6*  PLT 71* 83*   BMET:  Recent Labs  02/12/13 0403 02/13/13 0525  NA 136 135  K 4.4 4.4  CL 103 99  CO2 26 28  GLUCOSE 122* 135*  BUN 18 19  CREATININE 0.95 0.93  CALCIUM 9.0 9.5    PT/INR:  Lab Results  Component Value Date   INR 1.43 02/10/2013   INR 0.92 02/06/2013   INR 0.88 01/26/2013   ABG:  INR: Will add last result for INR, ABG once components are confirmed Will add last 4 CBG results once components are confirmed  Assessment/Plan:  1. CV -  Was bradycardic then tachycardic previously. Has a history of RBBB and bradycardia.Now with PVCs/SR.  On Lopressor 25 bid, Lisinopril 20 daily, and Norvasc  5 daily. Monitor BP as may need to adjust medications. 2.  Pulmonary - CXR this am shows right apical pneumothorax, atelectasis L>R. On 3 liters of oxygen via [place] as desat's 86-88% with ambulation on room air. Wean oxygen as tolerates.Encourage incentive spirometer 3. Volume Overload -  Lasix 40 daily 4.  Acute blood loss anemia - H and H stable at 10.8 and 31.6 5.Thrombocytopenia-Platelets up to 83,000 6.WBC slightly increased from 14.4 to 15.2. Remains afebrile. Likely secondary to SIRS and atelectasis 7.CBGs 143/139/117. Pre op HGA1C 6.1. Likely pre diabetic. Will stop glucose checks and SS PRN 8.LOC in am if no bowel movement 9.Possbily home Saturday or Sunday   ZIMMERMAN,DONIELLE MPA-C 02/13/2013,11:16 AM   I have seen and examined Donnajean Lopes and agree with the above assessment  and plan.  Delight Ovens MD Beeper [phone] Office [phone] 02/13/2013 1:56 PM

## 2013-02-13 NOTE — Progress Notes (Signed)
O2 88% RA at this time; O2 2L [place] applied; will cont. To monitor.

## 2013-02-13 NOTE — Progress Notes (Signed)
Pt has walked x2 today. Ed completed with wife present. Feels good about quitting smoking, gave resources for support. Interested in [facility] and will send his referral to [place]. To watch video over weekend. Also pt to walk without RW at some point to ensure he doesn't need it for home. Will f/u tomorrow. [phone] Ethelda Chick CES, ACSM 12:15 PM 02/13/2013

## 2013-02-13 NOTE — Discharge Summary (Signed)
Physician Discharge Summary       [address] [place]       [place] [address]             [phone]    Patient ID: Ralph Drake MRN: [id number] DOB/AGE: May 01, 1935 78 y.o.  Admit date: 02/10/2013 Discharge date: 02/17/2013  Admission Diagnoses: 1. Critical aortic stenosis 2.History of hypertension 3.History of PVD (s/p left iliac stent) 4.History of CAD (s/p PCTA) 5.History of dyslipidemia 6.History tobacco abuse 7.History of RBBB 8.History of bradycardia  Discharge Diagnoses:  1. Critical aortic stenosis 2.History of hypertension 3.History of PVD (s/p left iliac stent) 4.History of CAD (s/p PCTA) 5.History of dyslipidemia 6.History tobacco abuse 7.History of RBBB 8.History of bradycardia 9.Thrombocytopenia 10.ABLanemia  Procedure (s):  1.Left and right heart catheterization done by Dr. Herbie Baltimore on 01/28/2013: Coronary Anatomy:  Left Main: Large-caliber vessel that trifurcates into the LAD, Ramus Intermedius, and Circumflex LAD: Moderate large-caliber vessel that courses down around the apex. It gives rise to 3 walk caliber diagonal branches. There are only minimal luminal irregularities throughout the entire system the  Left Circumflex: This moderate caliber vessel with proximal diffuse mildly calcified 30% stenoses prior to entering the AV groove. Once in the AV groove it gives rise to a very small first OM1 and the AV groove circumflex before terminating as a lateral OM2 that gives off a small OM2. Otherwise Minimal luminal irregularities  Ramus intermedius: Moderate caliber vessel coursing along the anterior lateral wall bifurcates into 2 small branches distally, reaches the apex. RCA: Large-caliber dominant vessel. After a substantial RV marginal marginal branch in the mid vessel, there is diffuse 30-50% calcified stenoses before the vessel bifurcates distally into the Right Posterior Descending Artery and the Right Posterior AV Groove vessel.  Right PDA:  Moderate caliber vessel that bifurcates in the tandem system. The more medial branch has a much more robust branching system that actually provides some posterior lateral branches. Mild luminal irregularities.  Right Posterior AV Groove - Posterolateral System: Moderate caliber vessel that terminates as 2 small caliber right posterior lateral branches. Angiographically normal  2.Aortic valve replacement with pericardial tissue  valve, Edwards Lifescience, model 3300TFX, 23 mm, serial [id number] by Dr. Tyrone Sage on 02/11/2013.   History of Presenting Illness: This is a 78 year old male with known aortic stenosis. He has been followed with serial echocardiograms. He has known about the aortic stenosis for 3-4 years. He now notes increasing fatigue with exertion shortness of breath with exertion. He denies resting shortness of breath, orthopnea, syncope, presyncope, or angina. Because of worsening degree of aortic stenosis and symptoms, he is referred by Dr. Alanda Amass for consideration of aortic valve replacement.  The patient has known coronary occlusive disease having had a angioplasty in 1993 and stents placed in June of 2011. He also has known peripheral vascular disease and he is a current smoker. Dr. Herbie Baltimore performed a right and left heart catheterization on 01/28/2013. There was no significant coronary artery disease and it was confirmed that the patient had severe aortic stenosis. Dr. Tyrone Sage was consultedfor consideration of aortic valve replacement surgery. Potential risks, benefits, and complications of the surgery were discussed with the patient and he agreed to proceed. Pre operative carotid US showed no significant bilateral internal carotid artery stenosis. He underwent an AVR on 02/10/2013.   Brief Hospital Course:  The patient was extubated the evening of surgery without difficulty. He remained afebrile and hemodynamically stable. Theone Murdoch, a line, chest tubes, and foley were removed early  in  the post operative course. Lopressor was started and titrated accordingly. He was volume over loaded and diuresed. He was weaned off the insulin drip.The patient's HGA1C pre op was 6.1. He did have thrombocytopenia post op and was not given heparin or heparin-like medications. His last platelet count was up to 130,000. He also had ABL anemia. He did not require a transfusion post op. His last H and H was 10.5 and 30.2.The patient was felt surgically stable for transfer from the ICU to PCTU for further convalescence on 02/12/2013. He continuse to progress with cardiac rehab. He initially required 2-3 liters of oxygen via nasal cannula. He has been on Xopenex and Mucinex for his cough.He is currently on 2 liters via [place]. We will arrange home oxygen.He has been tolerating a diet and has had a bowel movement. Epicardial pacing wires and chest tube sutures will be removed prior to discharge.  Amiodarone was previously discontinued.He is currently on Lopressor 25 bid. He has only occasionally needed back up temporary pacing.Cardiology has evaluated him and has determined that he does not require a pacemaker at this time. He is felt surgically stable for discharge today.   Latest Vital Signs: Blood pressure 120/60, pulse 82, temperature 98.6 F (37 C), temperature source Oral, resp. rate 18, height 5' 4.96" (1.65 m), weight 60.238 kg (132 lb 12.8 oz), SpO2 100.00%.  Physical Exam: Cardiovascular: RRR  Pulmonary: Diminished at bases bilaterally; no rales, wheezes, or rhonchi.  Abdomen: Soft, non tender, bowel sounds present.  Extremities: Mild bilateral lower extremity edema.  Wound: Clean and dry. No erythema or signs of infection.   Discharge Condition:Stable  Recent laboratory studies:  Lab Results  Component Value Date   WBC 13.4* 02/14/2013   HGB 10.5* 02/14/2013   HCT 30.2* 02/14/2013   MCV 92.6 02/14/2013   PLT 130* 02/14/2013   Lab Results  Component Value Date   NA 137 02/15/2013   K 3.4* 02/15/2013     CL 97 02/15/2013   CO2 31 02/15/2013   CREATININE 1.08 02/15/2013   GLUCOSE 121* 02/15/2013      Diagnostic Studies: Dg Chest 2 View  02/13/2013   *RADIOLOGY REPORT*  Clinical Data: Right pneumothorax, status post heart valve replacement  CHEST - 2 VIEW  Comparison: Prior chest x-ray 02/12/2013  Findings: Right IJ vascular sheath has been removed. Stable to slightly improved small right apical pneumothorax with approximately 13 mm of pleural displacement.  The overall volume is approximately 5%.  Surgical changes of median sternotomy and aortic valve replacement.  Atherosclerotic calcifications noted throughout the transverse aorta.  Epicardial pacing leads remain in place. Slightly lower inspiratory volumes with increasing perihilar and left basilar subsegmental atelectasis.  Trace left pleural effusion.  Stable cardiac and mediastinal contours.  IMPRESSION:  1.  Stable to slightly improved small right apical pneumothorax. 2.  Lower inspiratory volumes with increased subsegmental atelectasis. 3.  Right IJ vascular sheath has been removed.   Original Report Authenticated By: Malachy Moan, M.D.    Discharge Orders   Future Appointments Provider Department Dept Phone   02/17/2013 3:30 PM [facility] Nurse [facility] Ginette Otto [phone]   02/18/2013 10:00 AM Sehvg-Sehvg Nurse [facility] Ginette Otto [phone]   03/12/2013 3:15 PM Delight Ovens, MD [facility] [phone]   Future Orders Complete By Expires   Amb Referral to Cardiac Rehabilitation  As directed    Comments:     Referring to [facility] Phase  2      Discharge Medications:   Medication List    STOP taking these medications       vitamin E 1000 UNIT capsule      TAKE these medications       amLODipine 5 MG tablet  Commonly known as:  NORVASC  Take 5 mg by mouth daily.     aspirin 325 MG EC tablet  Take 1 tablet (325 mg  total) by mouth daily.     atorvastatin 40 MG tablet  Commonly known as:  LIPITOR  Take 40 mg by mouth daily.     dextromethorphan-guaiFENesin 30-600 MG per 12 hr tablet  Commonly known as:  MUCINEX DM  Take 1 tablet by mouth 2 (two) times daily as needed (cough).     ezetimibe 10 MG tablet  Commonly known as:  ZETIA  Take 10 mg by mouth daily.     fish oil-omega-3 fatty acids 1000 MG capsule  Take 1 g by mouth daily.     furosemide 40 MG tablet  Commonly known as:  LASIX  Take 1 tablet (40 mg total) by mouth daily. For 5 days then stop.     lisinopril 20 MG tablet  Commonly known as:  PRINIVIL,ZESTRIL  Take 20 mg by mouth at bedtime.     metoprolol tartrate 25 MG tablet  Commonly known as:  LOPRESSOR  Take 1 tablet (25 mg total) by mouth 2 (two) times daily.     multivitamin per tablet  Take 1 tablet by mouth daily.     omeprazole 20 MG capsule  Commonly known as:  PRILOSEC  Take 20 mg by mouth daily.     potassium chloride SA 20 MEQ tablet  Commonly known as:  K-DUR,KLOR-CON  Take 1 tablet (20 mEq total) by mouth daily. For 5 days then stop.     traMADol 50 MG tablet  Commonly known as:  ULTRAM  Take 1-2 tablets (50-100 mg total) by mouth every 4 (four) hours as needed for pain.        The patient has been discharged on:   1.Beta Blocker:  Yes [ x  ]                              No   [   ]                              If No, reason:  2.Ace Inhibitor/ARB: Yes [ x  ]                                     No  [    ]                                     If No, reason:  3.Statin:   Yes [  x ]                  No  [   ]                  If No, reason:  4.Ecasa:  Yes  [ x  ]  No   [   ]                  If No, reason:  Follow Up Appointments: Follow-up Information   Follow up with Marykay Lex, MD On 02/27/2013. (4pm)    Specialty:  Cardiology   Contact information:   [address] [place] [address] [phone]        Follow up with GERHARDT,EDWARD B, MD. (PA/LAT CXR to be taken (at [facility] which is in the same building as Dr. Dennie Maizes office) on 03/12/2013 at 2:30 pm;Appointment with Dr. Tyrone Sage is on 03/12/2013 at 3:15 pm)    Specialty:  Cardiothoracic Surgery   Contact information:   [address] [employer] [address] [place] [address] [phone]       Follow up with Aura Dials, MD. (Call for a follow up appointment regarding further surveillance of HGA1C 6.1)    Specialty:  Family Medicine   Contact information:   [address] [place] [address] [phone]       Signed: Doree Fudge MPA-C 02/17/2013, 11:51 AM

## 2013-02-13 NOTE — Progress Notes (Signed)
SATURATION QUALIFICATIONS: (This note is used to comply with regulatory documentation for home oxygen)  Patient Saturations on Room Air at Rest = 96%  Patient Saturations on Room Air while Ambulating = 86-88%  Patient Saturations on 2 Liters of oxygen while Ambulating = 92%  Please briefly explain why patient needs home oxygen: pt desat to 86-88% RA while ambulating; pt slightly SOB on exertion; upon arrival back to room O2 sats down to 84% RA; O2 2L applied; O2 sats up to 92%; will cont. To monitor.

## 2013-02-13 NOTE — Progress Notes (Addendum)
Subjective:  Up in room without complaints  Objective:  Vital Signs in the last 24 hours: Temp:  [98.3 F (36.8 C)-98.9 F (37.2 C)] 98.9 F (37.2 C) (09/05 0522) Pulse Rate:  [76-109] 109 (09/05 0522) Resp:  [14-19] 18 (09/05 0522) BP: (105-158)/(58-78) 146/70 mmHg (09/05 0522) SpO2:  [91 %-94 %] 93 % (09/05 0522) Weight:  [140 lb 10.5 oz (63.8 kg)] 140 lb 10.5 oz (63.8 kg) (09/05 0500)  Intake/Output from previous day:  Intake/Output Summary (Last 24 hours) at 02/13/13 0935 Last data filed at 02/12/13 2030  Gross per 24 hour  Intake      0 ml  Output    580 ml  Net   -580 ml    Physical Exam: General appearance: alert, cooperative and no distress Lungs: clear to auscultation bilaterally Heart: regular rate and rhythm and soft systolic murmur LSB   Rate: 120  Rhythm: normal sinus rhythm vs A flutter  Lab Results:  Recent Labs  02/12/13 0403 02/13/13 0525  WBC 14.4* 15.2*  HGB 10.7* 10.8*  PLT 71* 83*    Recent Labs  02/12/13 0403 02/13/13 0525  NA 136 135  K 4.4 4.4  CL 103 99  CO2 26 28  GLUCOSE 122* 135*  BUN 18 19  CREATININE 0.95 0.93   No results found for this basename: TROPONINI, CK, MB,  in the last 72 hours  Recent Labs  02/10/13 1300  INR 1.43    Imaging: Imaging results have been reviewed  Cardiac Studies:  Assessment/Plan:   Principal Problem:   Aortic stenosis, severe Active Problems:   S/P tissue AVR 02/11/13   Bradycardia - can only tolerate low dose BB   Peripheral vascular disease - s/p L Iliac stent   CAD S/P percutaneous coronary angioplasty   Hypertension   Dyslipidemia, goal LDL below 70   RBBB  PLAN: Check EKG, HR in the 50s on 9/3 EKG, 120 this am.  Corine Shelter PA-C Beeper [phone] 02/13/2013, 9:35 AM   EKG shows NSR/ST/ RBBB  I have seen and evaluated the patient this AM along with Corine Shelter, PA. I agree with his findings, examination as well as impression recommendations.  Feels well s/p AVR.   Interestingly, his HR is up in S Tachy (previously had issues with Bradycardia). -- Can probably increase Metoprolol to 25 mg bid (still has pacer wires in place).  Is not in extreme pain.  Does have low grade F.  Encourage IS.  No active SSx of infection.  Feels well, no BM yet. BP actually up a bit  - can likely tolerate increased BB dose provide he does not get bradycardic. On stable CCB & ACE-I dose On statin.  Upon d/c - will see if appt with Dr. Alanda Amass is available, if not,can f/u with me.   MD Time with pt: 15 min  HARDING,DAVID W, M.D., M.S. [facility] [address] [place], [place]  [address]  [phone] Pager # [phone] 02/13/2013 10:36 AM

## 2013-02-13 NOTE — Progress Notes (Signed)
Pt ambulated 550 feet pushing wheelchair; O2 3L [place] in place; steady gait noted; pt back to room to chair; will cont. To monitor.

## 2013-02-14 ENCOUNTER — Inpatient Hospital Stay (HOSPITAL_COMMUNITY): Payer: Medicare Other

## 2013-02-14 LAB — CBC
HCT: 30.2 % — ABNORMAL LOW (ref 39.0–52.0)
Platelets: 130 10*3/uL — ABNORMAL LOW (ref 150–400)
RDW: 14.7 % (ref 11.5–15.5)
WBC: 13.4 10*3/uL — ABNORMAL HIGH (ref 4.0–10.5)

## 2013-02-14 MED ORDER — AMIODARONE HCL 200 MG PO TABS
400.0000 mg | ORAL_TABLET | Freq: Two times a day (BID) | ORAL | Status: DC
  Administered 2013-02-14 (×2): 400 mg via ORAL
  Filled 2013-02-14 (×4): qty 2

## 2013-02-14 MED ORDER — DM-GUAIFENESIN ER 30-600 MG PO TB12
1.0000 | ORAL_TABLET | Freq: Two times a day (BID) | ORAL | Status: DC | PRN
Start: 2013-02-14 — End: 2013-02-17
  Filled 2013-02-14: qty 1

## 2013-02-14 MED ORDER — LEVALBUTEROL HCL 0.63 MG/3ML IN NEBU
0.6300 mg | INHALATION_SOLUTION | Freq: Three times a day (TID) | RESPIRATORY_TRACT | Status: DC
  Administered 2013-02-14 – 2013-02-17 (×9): 0.63 mg via RESPIRATORY_TRACT
  Filled 2013-02-14 (×17): qty 3

## 2013-02-14 NOTE — Progress Notes (Signed)
Pt's rate jumping from 60s-140's, in and out of Afib, and pt wheezing bilaterally. Pt asymptomatic. BP stable. PA on floor and made aware. Orders given to give am dose of metoprolol early. Medication given see MAR. EKG completed. Pt attached to external pacer per orders. Will continue to monitor.

## 2013-02-14 NOTE — Progress Notes (Signed)
Pt ambulated in hall with O2. Pt given minimal assist. Walked 317ft and tolerated well.

## 2013-02-14 NOTE — Progress Notes (Addendum)
[address]       [place] [address]             [phone]      4 Days Post-Op  Procedure(s) (LRB): AORTIC VALVE REPLACEMENT (AVR) (N/A) INTRAOPERATIVE TRANSESOPHAGEAL ECHOCARDIOGRAM (N/A) Subjective: Feels ok, no palpitations, + cough/sputum (white)  Objective  Telemetry afib 120's currently, some brady at times  Temp:  [98.6 F (37 C)-98.7 F (37.1 C)] 98.7 F (37.1 C) (09/06 0445) Pulse Rate:  [85-110] 85 (09/06 0445) Resp:  [18-20] 19 (09/06 0445) BP: (115-151)/(65-80) 115/71 mmHg (09/06 0445) SpO2:  [88 %-95 %] 91 % (09/06 0445) Weight:  [140 lb 3.2 oz (63.594 kg)] 140 lb 3.2 oz (63.594 kg) (09/06 0445)   Intake/Output Summary (Last 24 hours) at 02/14/13 0752 Last data filed at 02/13/13 1842  Gross per 24 hour  Intake    720 ml  Output   1850 ml  Net  -1130 ml       General appearance: alert, cooperative and no distress Heart: irregularly irregular rhythm Lungs: coarse with scattered wheeze Abdomen: soft, nontender Extremities: no edema Wound: incisions healing well  Lab Results:  Recent Labs  02/11/13 1600  02/12/13 0403 02/13/13 0525  NA  --   < > 136 135  K  --   < > 4.4 4.4  CL  --   < > 103 99  CO2  --   --  26 28  GLUCOSE  --   < > 122* 135*  BUN  --   < > 18 19  CREATININE 1.07  < > 0.95 0.93  CALCIUM  --   --  9.0 9.5  MG 2.2  --   --   --   < > = values in this interval not displayed. No results found for this basename: AST, ALT, ALKPHOS, BILITOT, PROT, ALBUMIN,  in the last 72 hours No results found for this basename: LIPASE, AMYLASE,  in the last 72 hours  Recent Labs  02/13/13 0525 02/14/13 0506  WBC 15.2* 13.4*  HGB 10.8* 10.5*  HCT 31.6* 30.2*  MCV 92.4 92.6  PLT 83* 130*   No results found for this basename: CKTOTAL, CKMB, TROPONINI,  in the last 72 hours No components found with this basename: POCBNP,  No results found for this basename: DDIMER,  in the last 72 hours No results found for  this basename: HGBA1C,  in the last 72 hours No results found for this basename: CHOL, HDL, LDLCALC, TRIG, CHOLHDL,  in the last 72 hours No results found for this basename: TSH, T4TOTAL, FREET3, T3FREE, THYROIDAB,  in the last 72 hours No results found for this basename: VITAMINB12, FOLATE, FERRITIN, TIBC, IRON, RETICCTPCT,  in the last 72 hours  Medications: Scheduled . amLODipine  5 mg Oral Daily  . aspirin EC  325 mg Oral Daily  . atorvastatin  40 mg Oral Daily  . docusate sodium  200 mg Oral Daily  . ezetimibe  10 mg Oral Daily  . furosemide  40 mg Oral Daily  . lisinopril  20 mg Oral Daily  . metoprolol tartrate  25 mg Oral BID  . pantoprazole  40 mg Oral QAC breakfast  . potassium chloride  20 mEq Oral Daily  . sodium chloride  3 mL Intravenous Q12H     Radiology/Studies:  Dg Chest 2 View  02/13/2013   *RADIOLOGY REPORT*  Clinical Data: Right pneumothorax, status post heart valve replacement  CHEST - 2 VIEW  Comparison: Prior chest x-ray 02/12/2013  Findings: Right IJ vascular sheath has been removed. Stable to slightly improved small right apical pneumothorax with approximately 13 mm of pleural displacement.  The overall volume is approximately 5%.  Surgical changes of median sternotomy and aortic valve replacement.  Atherosclerotic calcifications noted throughout the transverse aorta.  Epicardial pacing leads remain in place. Slightly lower inspiratory volumes with increasing perihilar and left basilar subsegmental atelectasis.  Trace left pleural effusion.  Stable cardiac and mediastinal contours.  IMPRESSION:  1.  Stable to slightly improved small right apical pneumothorax. 2.  Lower inspiratory volumes with increased subsegmental atelectasis. 3.  Right IJ vascular sheath has been removed.   Original Report Authenticated By: Malachy Moan, M.D.    INR: Will add last result for INR, ABG once components are confirmed Will add last 4 CBG results once components are  confirmed  Assessment/Plan: S/P Procedure(s) (LRB): AORTIC VALVE REPLACEMENT (AVR) (N/A) INTRAOPERATIVE TRANSESOPHAGEAL ECHOCARDIOGRAM (N/A)  1 afib with RVR, query SSS with some brady. Also has BBB . Will give po amio and place to VVI 60 . Give po betablocker dose early. 2 add bronchodilator , mucinex- smoker, may be early bronchitis 3 H/H stable, leukocytosis improved    LOS: 4 days    GOLD,WAYNE E 9/6/20147:52 AM   Chart reviewed, patient examined, agree with above.

## 2013-02-14 NOTE — Progress Notes (Signed)
CARDIAC REHAB PHASE I   PRE:  Rate/Rhythm: 74 sinus  BP:  Supine: 115/75     MODE:  Ambulation: 550 ft   POST:  Rate/Rhythem: 107 sinustach  BP:  Sitting: 142/76   SaO2: 88-90% 2L  Pt ambulated 550 ft with assist x1.  Pt tolerated walk well without complaints.  Pt returned to bedside for vitals and then to bathroom. Pt wife is in room.  Pt had no questions from yesterday's education session.  We will follow up on Monday. Fabio Pierce, MA, ACSM RCEP   Juanetta Gosling, Doy Mince

## 2013-02-14 NOTE — Progress Notes (Signed)
Subjective:  No CP/SOB  Objective:  Temp:  [98.6 F (37 C)-98.7 F (37.1 C)] 98.7 F (37.1 C) (09/06 0445) Pulse Rate:  [85-120] 120 (09/06 0752) Resp:  [18-20] 19 (09/06 0445) BP: (115-151)/(65-80) 116/75 mmHg (09/06 0752) SpO2:  [88 %-95 %] 92 % (09/06 0831) Weight:  [140 lb 3.2 oz (63.594 kg)] 140 lb 3.2 oz (63.594 kg) (09/06 0445) Weight change: -7.3 oz (-0.206 kg)  Intake/Output from previous day: 09/05 0701 - 09/06 0700 In: 720 [P.O.:720] Out: 1850 [Urine:1850]  Intake/Output from this shift:    Physical Exam: General appearance: alert and no distress Neck: no adenopathy, no carotid bruit, no JVD, supple, symmetrical, trachea midline and thyroid not enlarged, symmetric, no tenderness/mass/nodules Lungs: scattered wheezes Heart: regular rate and rhythm, S1, S2 normal, no murmur, click, rub or gallop Extremities: extremities normal, atraumatic, no cyanosis or edema  Lab Results: Results for orders placed during the hospital encounter of 02/10/13 (from the past 48 hour(s))  GLUCOSE, CAPILLARY     Status: Abnormal   Collection Time    02/12/13 12:14 PM      Result Value Range   Glucose-Capillary 120 (*) 70 - 99 mg/dL   Comment 1 Documented in Chart     Comment 2 Notify RN    GLUCOSE, CAPILLARY     Status: Abnormal   Collection Time    02/12/13  4:11 PM      Result Value Range   Glucose-Capillary 143 (*) 70 - 99 mg/dL  GLUCOSE, CAPILLARY     Status: Abnormal   Collection Time    02/12/13  9:18 PM      Result Value Range   Glucose-Capillary 139 (*) 70 - 99 mg/dL  CBC     Status: Abnormal   Collection Time    02/13/13  5:25 AM      Result Value Range   WBC 15.2 (*) 4.0 - 10.5 K/uL   RBC 3.42 (*) 4.22 - 5.81 MIL/uL   Hemoglobin 10.8 (*) 13.0 - 17.0 g/dL   HCT 84.1 (*) 32.4 - 40.1 %   MCV 92.4  78.0 - 100.0 fL   MCH 31.6  26.0 - 34.0 pg   MCHC 34.2  30.0 - 36.0 g/dL   RDW 02.7  25.3 - 66.4 %   Platelets 83 (*) 150 - 400 K/uL   Comment: CONSISTENT WITH  PREVIOUS RESULT  BASIC METABOLIC PANEL     Status: Abnormal   Collection Time    02/13/13  5:25 AM      Result Value Range   Sodium 135  135 - 145 mEq/L   Potassium 4.4  3.5 - 5.1 mEq/L   Chloride 99  96 - 112 mEq/L   CO2 28  19 - 32 mEq/L   Glucose, Bld 135 (*) 70 - 99 mg/dL   BUN 19  6 - 23 mg/dL   Creatinine, Ser 4.03  0.50 - 1.35 mg/dL   Calcium 9.5  8.4 - 47.4 mg/dL   GFR calc non Af Amer 80 (*) >90 mL/min   GFR calc Af Amer >90  >90 mL/min   Comment: (NOTE)     The eGFR has been calculated using the CKD EPI equation.     This calculation has not been validated in all clinical situations.     eGFR's persistently <90 mL/min signify possible Chronic Kidney     Disease.  GLUCOSE, CAPILLARY     Status: Abnormal   Collection Time    02/13/13  6:05 AM      Result Value Range   Glucose-Capillary 118 (*) 70 - 99 mg/dL  GLUCOSE, CAPILLARY     Status: Abnormal   Collection Time    02/13/13 11:12 AM      Result Value Range   Glucose-Capillary 117 (*) 70 - 99 mg/dL  CBC     Status: Abnormal   Collection Time    02/14/13  5:06 AM      Result Value Range   WBC 13.4 (*) 4.0 - 10.5 K/uL   RBC 3.26 (*) 4.22 - 5.81 MIL/uL   Hemoglobin 10.5 (*) 13.0 - 17.0 g/dL   HCT 40.9 (*) 81.1 - 91.4 %   MCV 92.6  78.0 - 100.0 fL   MCH 32.2  26.0 - 34.0 pg   MCHC 34.8  30.0 - 36.0 g/dL   RDW 78.2  95.6 - 21.3 %   Platelets 130 (*) 150 - 400 K/uL   Comment: DELTA CHECK NOTED    Imaging: Imaging results have been reviewed  Assessment/Plan:   1. Principal Problem: 2.   Aortic stenosis, severe 3. Active Problems: 4.   Hypertension 5.   Peripheral vascular disease - s/p L Iliac stent 6.   S/P tissue AVR 02/11/13 7.   CAD S/P percutaneous coronary angioplasty 8.   Dyslipidemia, goal LDL below 70 9.   Bradycardia - can only tolerate low dose BB 10.   RBBB 11.   Time Spent Directly with Patient:  20 minutes  Length of Stay:  LOS: 4 days   POD # 4 tissue AVR. Nl LV fxn. No signif CAD  at cath. Ambulating w/o difficulty. Still has pacing wires in place. Had an episode of PAF this AM at rate of 120. Currently NSR. Exam benign. Labs OK. Nl progression per TCTS. F/U with Dr. Herbie Baltimore after D/C.  Runell Gess 02/14/2013, 8:43 AM

## 2013-02-15 DIAGNOSIS — I4891 Unspecified atrial fibrillation: Secondary | ICD-10-CM

## 2013-02-15 LAB — BASIC METABOLIC PANEL
GFR calc Af Amer: 75 mL/min — ABNORMAL LOW (ref >90)
GFR calc non Af Amer: 65 mL/min — ABNORMAL LOW (ref >90)
Glucose, Bld: 121 mg/dL — ABNORMAL HIGH (ref 70–99)
Potassium: 3.4 mEq/L — ABNORMAL LOW (ref 3.5–5.1)
Sodium: 137 mEq/L (ref 135–145)

## 2013-02-15 MED ORDER — POTASSIUM CHLORIDE CRYS ER 20 MEQ PO TBCR
40.0000 meq | EXTENDED_RELEASE_TABLET | Freq: Once | ORAL | Status: AC
  Administered 2013-02-15: 40 meq via ORAL

## 2013-02-15 MED ORDER — POTASSIUM CHLORIDE CRYS ER 20 MEQ PO TBCR
40.0000 meq | EXTENDED_RELEASE_TABLET | Freq: Every day | ORAL | Status: DC
  Administered 2013-02-16 – 2013-02-17 (×2): 40 meq via ORAL
  Filled 2013-02-15 (×3): qty 2

## 2013-02-15 NOTE — Progress Notes (Signed)
Pt ambulated in hall with minimal assist on oxgen. Pt tolerated well. O2 sat was 96% on return. Continue to monitor.

## 2013-02-15 NOTE — Progress Notes (Signed)
Pt ambulated in hallway 550 ft with rolling walker and 2.5 liters of oxygen. Pt tolerated activity well. Will continue to monitor.

## 2013-02-15 NOTE — Progress Notes (Signed)
Pt ambulated in hallway 550 ft with rolling walker and 2.5 liters of oxygen. Pt tolerated activity well. Will continue to monitor.  

## 2013-02-15 NOTE — Progress Notes (Addendum)
[address]       [place] [address]             [phone]      5 Days Post-Op  Procedure(s) (LRB): AORTIC VALVE REPLACEMENT (AVR) (N/A) INTRAOPERATIVE TRANSESOPHAGEAL ECHOCARDIOGRAM (N/A) Subjective: Less congested  Objective  Telemetry some afib, pvc's paced at times, currently SR  Temp:  [97.9 F (36.6 C)-99.5 F (37.5 C)] 97.9 F (36.6 C) (09/07 0440) Pulse Rate:  [79-120] 84 (09/07 0440) Resp:  [14-18] 18 (09/07 0440) BP: (115-131)/(58-77) 124/58 mmHg (09/07 0440) SpO2:  [90 %-96 %] 96 % (09/07 0440) Weight:  [137 lb 2 oz (62.2 kg)] 137 lb 2 oz (62.2 kg) (09/07 0440)   Intake/Output Summary (Last 24 hours) at 02/15/13 0733 Last data filed at 02/14/13 1411  Gross per 24 hour  Intake    480 ml  Output   1700 ml  Net  -1220 ml       General appearance: alert, cooperative and no distress Heart: regular rate and rhythm Lungs: wheezes scattered, dim air exchange throughout Abdomen: benign Extremities: no edema Wound: incis healing well  Lab Results:  Recent Labs  02/13/13 0525 02/15/13 0520  NA 135 137  K 4.4 3.4*  CL 99 97  CO2 28 31  GLUCOSE 135* 121*  BUN 19 30*  CREATININE 0.93 1.08  CALCIUM 9.5 9.4  MG  --  2.0   No results found for this basename: AST, ALT, ALKPHOS, BILITOT, PROT, ALBUMIN,  in the last 72 hours No results found for this basename: LIPASE, AMYLASE,  in the last 72 hours  Recent Labs  02/13/13 0525 02/14/13 0506  WBC 15.2* 13.4*  HGB 10.8* 10.5*  HCT 31.6* 30.2*  MCV 92.4 92.6  PLT 83* 130*   No results found for this basename: CKTOTAL, CKMB, TROPONINI,  in the last 72 hours No components found with this basename: POCBNP,  No results found for this basename: DDIMER,  in the last 72 hours No results found for this basename: HGBA1C,  in the last 72 hours No results found for this basename: CHOL, HDL, LDLCALC, TRIG, CHOLHDL,  in the last 72 hours No results found for this basename: TSH, T4TOTAL,  FREET3, T3FREE, THYROIDAB,  in the last 72 hours No results found for this basename: VITAMINB12, FOLATE, FERRITIN, TIBC, IRON, RETICCTPCT,  in the last 72 hours  Medications: Scheduled . amiodarone  400 mg Oral BID  . amLODipine  5 mg Oral Daily  . aspirin EC  325 mg Oral Daily  . atorvastatin  40 mg Oral Daily  . docusate sodium  200 mg Oral Daily  . ezetimibe  10 mg Oral Daily  . furosemide  40 mg Oral Daily  . levalbuterol  0.63 mg Nebulization TID  . lisinopril  20 mg Oral Daily  . metoprolol tartrate  25 mg Oral BID  . pantoprazole  40 mg Oral QAC breakfast  . potassium chloride  20 mEq Oral Daily  . sodium chloride  3 mL Intravenous Q12H     Radiology/Studies:  Dg Chest 2 View  02/14/2013   *RADIOLOGY REPORT*  Clinical Data: Evaluate right pneumothorax  CHEST - 2 VIEW  Comparison: Prior chest x-ray 02/13/2013  Findings: Stable small right apical pneumothorax.  Slightly increased bibasilar and right mid lung atelectasis.  Cardiac mediastinal contours are unchanged.  Status post median sternotomy for aortic valve replacement.  Aortic atherosclerotic calcifications.  No pulmonary edema.  IMPRESSION:  1.  Stable small right apical pneumothorax. 2.  Slightly increased bibasilar and right mid lung atelectasis.   Original Report Authenticated By: Malachy Moan, M.D.    INR: Will add last result for INR, ABG once components are confirmed Will add last 4 CBG results once components are confirmed  Assessment/Plan: S/P Procedure(s) (LRB): AORTIC VALVE REPLACEMENT (AVR) (N/A) INTRAOPERATIVE TRANSESOPHAGEAL ECHOCARDIOGRAM (N/A)  1 Afib/BBB with sick sinus rhythm- don't feel safe to cont amio without long term pacer back-up so will d/c. Received 2 oral doses of 400 mg 2 bronchitis- cont nebs, pulm toilet, wean O2      LOS: 5 days    GOLD,WAYNE E 9/7/20147:33 AM   Chart reviewed, patient examined, agree with above. Amio stopped. Cardiology will decide about need for PPM.

## 2013-02-15 NOTE — Progress Notes (Signed)
Subjective: No complaints, up walking in room  Objective: Vital signs in last 24 hours: Temp:  [97.9 F (36.6 C)-99.5 F (37.5 C)] 97.9 F (36.6 C) (09/07 0440) Pulse Rate:  [79-89] 84 (09/07 0440) Resp:  [14-18] 18 (09/07 0440) BP: (115-131)/(58-77) 124/58 mmHg (09/07 0440) SpO2:  [90 %-96 %] 96 % (09/07 0440) Weight:  [137 lb 2 oz (62.2 kg)] 137 lb 2 oz (62.2 kg) (09/07 0440) Weight change: -3 lb 1.2 oz (-1.394 kg) Last BM Date: 02/15/13 Intake/Output from previous day: -1220  Wt down from 140 to 137, not yet back to baseline.   09/06 0701 - 09/07 0700 In: 480 [P.O.:480] Out: 1700 [Urine:1700] Intake/Output this shift: Total I/O In: 360 [P.O.:360] Out: 1200 [Urine:1200]  PE: General:Pleasant affect, NAD Skin:Warm and dry, brisk capillary refill HEENT:normocephalic, sclera clear, mucus membranes moist Neck:supple, no JVD, no bruits  Heart:S1S2 RRR without murmur, gallup, rub or click Lungs:clear without rales, rhonchi, occ wheeze AVW:UJWJ, non tender, + BS, do not palpate liver spleen or masses Ext:no lower ext edema, 2+ pedal pulses, 2+ radial pulses Neuro:alert and oriented, MAE, follows commands, + facial symmetry   Lab Results:  Recent Labs  02/13/13 0525 02/14/13 0506  WBC 15.2* 13.4*  HGB 10.8* 10.5*  HCT 31.6* 30.2*  PLT 83* 130*   BMET  Recent Labs  02/13/13 0525 02/15/13 0520  NA 135 137  K 4.4 3.4*  CL 99 97  CO2 28 31  GLUCOSE 135* 121*  BUN 19 30*  CREATININE 0.93 1.08  CALCIUM 9.5 9.4   No results found for this basename: TROPONINI, CK, MB,  in the last 72 hours  Lab Results  Component Value Date   CHOL 156 11/28/2012   HDL 53 11/28/2012   LDLCALC 92 11/28/2012   TRIG 54 11/28/2012   CHOLHDL 2.9 11/28/2012   Lab Results  Component Value Date   HGBA1C 6.3* 02/06/2013     Lab Results  Component Value Date   TSH 3.636 01/26/2013      Studies/Results: Dg Chest 2 View  02/14/2013   *RADIOLOGY REPORT*  Clinical Data:  Evaluate right pneumothorax  CHEST - 2 VIEW  Comparison: Prior chest x-ray 02/13/2013  Findings: Stable small right apical pneumothorax.  Slightly increased bibasilar and right mid lung atelectasis.  Cardiac mediastinal contours are unchanged.  Status post median sternotomy for aortic valve replacement.  Aortic atherosclerotic calcifications.  No pulmonary edema.  IMPRESSION:  1.  Stable small right apical pneumothorax. 2.  Slightly increased bibasilar and right mid lung atelectasis.   Original Report Authenticated By: Malachy Moan, M.D.    Medications: I have reviewed the patient's current medications. Scheduled Meds: . amLODipine  5 mg Oral Daily  . aspirin EC  325 mg Oral Daily  . atorvastatin  40 mg Oral Daily  . docusate sodium  200 mg Oral Daily  . ezetimibe  10 mg Oral Daily  . furosemide  40 mg Oral Daily  . levalbuterol  0.63 mg Nebulization TID  . lisinopril  20 mg Oral Daily  . metoprolol tartrate  25 mg Oral BID  . pantoprazole  40 mg Oral QAC breakfast  . potassium chloride  20 mEq Oral Daily  . sodium chloride  3 mL Intravenous Q12H   Continuous Infusions:  PRN Meds:.sodium chloride, acetaminophen, bisacodyl, bisacodyl, dextromethorphan-guaiFENesin, ondansetron (ZOFRAN) IV, ondansetron, oxyCODONE, sodium chloride, traMADol  Assessment/Plan: Principal Problem:   Aortic stenosis, severe Active Problems:   Hypertension   Peripheral  vascular disease - s/p L Iliac stent   S/P tissue AVR 02/11/13   CAD S/P percutaneous coronary angioplasty   Dyslipidemia, goal LDL below 70   Bradycardia - can only tolerate low dose BB   RBBB  PLAN: pt with PAF, difficult to increase Lopressor with hx of bradycardia with higher dose of BB, hx RBBB.  Amiodarone has been stopped.  Pt continues to pace at times.  Consideration for PPM?    Discussed with pt and wife.  He has had episodes of HR down to 30 per his wife.   LOS: 5 days   Time spent with pt. :15 minutes. [facility] R  Nurse  Practitioner Certified Pager [phone] 02/15/2013, 9:21 AM  I have seen and evaluated the patient this AM along with Nada Boozer, NP. I agree with her findings, examination as well as impression recommendations.  Overall progressing well from post-op standpoint.   Short burst of ? PAF/PAT with frequent PACs on tele as well as intermittent episodes of pacing.  Lungs are a bit more "wheezy" than previously.  ?? The question now is what to do about his bradycardia & now PAF.  We cannot be certain that his PAF is post-op or actually present pre-op AVR. -- we do now that he was profoundly bradycardic pre-op, requiring reduction in BB dose   With his prior bradycardia (HRs in 40s during cath & as low as 30s) - any rate or rhythm control will likely make this worse.  => with all of the PACs, he does need some low dose BB though.  For now, would hold the Amiodarone as we cannot be certain that Afib will persist beyond the early post-op stage.    Will d/w Dr. Royann Shivers in AM to decide if PPM may just be the best option  Would keep PM wires in place until this decision can be made.  We do know that he tolerated rates as low as 40bpm @home  -- agree with reducing demand pacing rate to 50 --- to allow for more spontaneous rhythm conduction.  No active angina or notable CHF findings.  MD Time with pt: 15 min  Jarae Panas W, M.D., M.S. [facility] [address] [place], [place]  [address]  [phone] Pager # [phone] 02/15/2013 10:18 AM

## 2013-02-15 NOTE — Progress Notes (Signed)
Pt oxygen sats resting on room air were 86 %. Pt placed on 1 liter of oxygen, sats were 88%. Pt increase to 2 liters of oxygen and sats ranged from 88-89%. Pt increased to 2.5 liters of oxygen and sats increased to 96 %. Pt encouraged to use IS more. Pt currently reaching 1000 on IS. Will continue to monitor.

## 2013-02-16 DIAGNOSIS — I4510 Unspecified right bundle-branch block: Secondary | ICD-10-CM

## 2013-02-16 NOTE — Progress Notes (Signed)
CARDIAC REHAB PHASE I   PRE:  Rate/Rhythm: 60-79SR  BP:  Supine:   Sitting: 124/50  Standing:    SaO2: 99% 2.5L, 93%RA  MODE:  Ambulation: 840 ft   POST:  Rate/Rhythm: 103ST  BP:  Supine:   Sitting: 166/79  Standing:    SaO2: 87%RA at about 220ft, 92-95%2L   SATURATION QUALIFICATIONS: (This note is used to comply with regulatory documentation for home oxygen)  Patient Saturations on Room Air at Rest = 93%  Patient Saturations on Room Air while Ambulating = 87%  Patient Saturations on 2 Liters of oxygen while Ambulating = 92-95%  Please briefly explain why patient needs home oxygen: desat on RA when walking long distances  [phone] See above documentation for oxygen needs. Pt walked independently with steady gait. I manned oxygen and external pacer and monitored sats while pt walked. Pt tolerated well. Heart rate to 103 after walk and BP increased.  Walked 840 ft with little effort. To recliner after walk. Needs oxygen to keep sats up when walking.   Luetta Nutting, RN BSN  02/16/2013 9:50 AM

## 2013-02-16 NOTE — Progress Notes (Signed)
Subjective:  Feels "pretty good" today.  Objective:  Vital Signs in the last 24 hours: Temp:  [98.1 F (36.7 C)-98.9 F (37.2 C)] 98.4 F (36.9 C) (09/08 0453) Pulse Rate:  [77-85] 77 (09/08 0453) Resp:  [14-18] 18 (09/08 0453) BP: (121-135)/(66-81) 135/81 mmHg (09/08 0453) SpO2:  [86 %-96 %] 90 % (09/08 0453) Weight:  [136 lb 14.5 oz (62.1 kg)] 136 lb 14.5 oz (62.1 kg) (09/08 0453)  Intake/Output from previous day:  Intake/Output Summary (Last 24 hours) at 02/16/13 0833 Last data filed at 02/16/13 0500  Gross per 24 hour  Intake    940 ml  Output   2625 ml  Net  -1685 ml    Physical Exam: General appearance: alert, cooperative and no distress Lungs: clear to auscultation bilaterally Heart: regular rate and rhythm   Rate: 80  Rhythm: normal sinus rhythm  Lab Results:  Recent Labs  02/14/13 0506  WBC 13.4*  HGB 10.5*  PLT 130*    Recent Labs  02/15/13 0520  NA 137  K 3.4*  CL 97  CO2 31  GLUCOSE 121*  BUN 30*  CREATININE 1.08   No results found for this basename: TROPONINI, CK, MB,  in the last 72 hours No results found for this basename: INR,  in the last 72 hours  Imaging: Imaging results have been reviewed  Cardiac Studies:  Assessment/Plan:   Principal Problem:   Aortic stenosis, severe Active Problems:   S/P tissue AVR 02/11/13   Bradycardia - can only tolerate low dose BB, continues to need temp pacer   Peripheral vascular disease - s/p L Iliac stent   CAD S/P PTCA of CFX X 2 in '93   PAF (paroxysmal atrial fibrillation), post op   Hypertension   Dyslipidemia, goal LDL below 70   RBBB    PLAN: He still has pacer wires in. He seems to be tolerating Metoprolol 25 mg BID.  Corine Shelter PA-C Beeper [phone] 02/16/2013, 8:33 AM   Agree with note written by Corine Shelter PAC  POD #6 AVR. On BB. SB. Ambulating with [facility]. Exam nl. D/C per TCTS. F/U with Dr. Herbie Baltimore.   Runell Gess 02/16/2013 10:45 AM

## 2013-02-16 NOTE — Progress Notes (Addendum)
      [address]       [place] [address]             [phone]        6 Days Post-Op Procedure(s) (LRB): AORTIC VALVE REPLACEMENT (AVR) (N/A) INTRAOPERATIVE TRANSESOPHAGEAL ECHOCARDIOGRAM (N/A)  Subjective: Patient eating breakfast and is without complaints.  Objective: Vital signs in last 24 hours: Temp:  [98.1 F (36.7 C)-98.9 F (37.2 C)] 98.4 F (36.9 C) (09/08 0453) Pulse Rate:  [77-85] 77 (09/08 0453) Cardiac Rhythm:  [-] Normal sinus rhythm;Sinus bradycardia;Sinus tachycardia;Atrial fibrillation (09/07 2000) Resp:  [14-18] 18 (09/08 0453) BP: (121-135)/(66-81) 135/81 mmHg (09/08 0453) SpO2:  [86 %-96 %] 90 % (09/08 0453) Weight:  [62.1 kg (136 lb 14.5 oz)] 62.1 kg (136 lb 14.5 oz) (09/08 0453)  Pre op weight  60.6 kg Current Weight  02/16/13 62.1 kg (136 lb 14.5 oz)      Intake/Output from previous day: 09/07 0701 - 09/08 0700 In: 940 [P.O.:940] Out: 2625 [Urine:2625]   Physical Exam:  Cardiovascular: RRR Pulmonary: Diminished at bases bilaterally; no rales, wheezes, or rhonchi. Abdomen: Soft, non tender, bowel sounds present. Extremities:Trace lower extremity edema. Wound: Clean and dry.  No erythema or signs of infection.  Lab Results: CBC:  Recent Labs  02/14/13 0506  WBC 13.4*  HGB 10.5*  HCT 30.2*  PLT 130*   BMET:   Recent Labs  02/15/13 0520  NA 137  K 3.4*  CL 97  CO2 31  GLUCOSE 121*  BUN 30*  CREATININE 1.08  CALCIUM 9.4    PT/INR:  Lab Results  Component Value Date   INR 1.43 02/10/2013   INR 0.92 02/06/2013   INR 0.88 01/26/2013   ABG:  INR: Will add last result for INR, ABG once components are confirmed Will add last 4 CBG results once components are confirmed  Assessment/Plan:  1. CV -  Has a history of RBBB and bradycardia.Now with PVCs/SR.  PAF and bradycardia. On Lopressor 25 bid, Lisinopril 20 daily, and Norvasc 5 daily. Cardiology to determine if requires PPM 2.  Pulmonary - On 2.5  liters of oxygen via [facility] as desat's with ambulation on room air. May need home oxygen. Encourage incentive spirometer 3. Volume Overload -  Lasix 40 daily 4.  Acute blood loss anemia - H and H stable at 10.8 and 31.6 5.Thrombocytopenia-Last platelets up to 130,000    ZIMMERMAN,Ralph Drake MPA-C 02/16/2013,7:41 AM  Currently sinus rhythm, waiting for cardiology decision about pacer  I have seen and examined Ralph Drake and agree with the above assessment  and plan.  Delight Ovens MD Beeper [phone] Office [phone] 02/16/2013 3:11 PM

## 2013-02-17 ENCOUNTER — Other Ambulatory Visit: Payer: Self-pay | Admitting: Cardiology

## 2013-02-17 DIAGNOSIS — I480 Paroxysmal atrial fibrillation: Secondary | ICD-10-CM

## 2013-02-17 MED ORDER — DM-GUAIFENESIN ER 30-600 MG PO TB12: 1.0000 | ORAL_TABLET | Freq: Two times a day (BID) | ORAL | Status: DC | PRN

## 2013-02-17 MED ORDER — METOPROLOL TARTRATE 25 MG PO TABS: 25.0000 mg | ORAL_TABLET | Freq: Two times a day (BID) | ORAL | Status: DC

## 2013-02-17 MED ORDER — FUROSEMIDE 40 MG PO TABS: 40.0000 mg | ORAL_TABLET | Freq: Every day | ORAL | Status: DC

## 2013-02-17 MED ORDER — POTASSIUM CHLORIDE CRYS ER 20 MEQ PO TBCR: 20.0000 meq | EXTENDED_RELEASE_TABLET | Freq: Every day | ORAL | Status: DC

## 2013-02-17 MED ORDER — TRAMADOL HCL 50 MG PO TABS: 50.0000 mg | ORAL_TABLET | ORAL | Status: DC | PRN

## 2013-02-17 MED ORDER — ASPIRIN 325 MG PO TBEC: 325.0000 mg | DELAYED_RELEASE_TABLET | Freq: Every day | ORAL | Status: DC

## 2013-02-17 NOTE — Progress Notes (Signed)
Subjective:  Up without problems  Objective:  Vital Signs in the last 24 hours: Temp:  [98.5 F (36.9 C)-98.6 F (37 C)] 98.6 F (37 C) (09/09 0516) Pulse Rate:  [60-86] 60 (09/09 0516) Resp:  [16-18] 18 (09/09 0516) BP: (120-136)/(68-82) 136/68 mmHg (09/09 0516) SpO2:  [94 %-100 %] 100 % (09/09 0516) Weight:  [132 lb 12.8 oz (60.238 kg)] 132 lb 12.8 oz (60.238 kg) (09/09 0516)  Intake/Output from previous day:  Intake/Output Summary (Last 24 hours) at 02/17/13 0938 Last data filed at 02/17/13 0518  Gross per 24 hour  Intake    480 ml  Output   1975 ml  Net  -1495 ml    Physical Exam: General appearance: alert, cooperative and no distress Lungs: clear to auscultation bilaterally Heart: regular rate and rhythm   Rate: 66  Rhythm: normal sinus rhythm  Lab Results: No results found for this basename: WBC, HGB, PLT,  in the last 72 hours  Recent Labs  02/15/13 0520  NA 137  K 3.4*  CL 97  CO2 31  GLUCOSE 121*  BUN 30*  CREATININE 1.08   No results found for this basename: TROPONINI, CK, MB,  in the last 72 hours No results found for this basename: INR,  in the last 72 hours  Imaging: Imaging results have been reviewed  Cardiac Studies:  Assessment/Plan:   Principal Problem:   Aortic stenosis, severe Active Problems:   S/P tissue AVR 02/11/13   Bradycardia - can only tolerate low dose BB, continues to need temp pacer   Peripheral vascular disease - s/p L Iliac stent   CAD S/P PTCA of CFX X 2 in '93   PAF (paroxysmal atrial fibrillation), post op   Hypertension   Dyslipidemia, goal LDL below 70   RBBB    PLAN:   Discussed with Dr Herbie [place], OK to discharge from our standpoint today. We will arrange for OP monitor to be placed.  Corine Shelter PA-C Beeper [phone] 02/17/2013, 9:38 AM

## 2013-02-17 NOTE — Progress Notes (Addendum)
Assessment unchanged with normal vital signs.Removed epicardial pacing wires and instructed patient to remain on bedrest for 1hr. Patient verbalized understanding. Will continue to monitor closely.   Earlene Plater, Dreama R   EPW and CT sutures removed per order. Benzoin and 1/2" steri strips applied.  Will continue to monitor pt closely. Gloriajean Dell

## 2013-02-17 NOTE — Progress Notes (Signed)
Pt. Seen & examined.  Tele reviewed.  Discussed with [name] -- I agree that with minimal temp wire pacing, he should be OK for d/c home on Cardionet Monitor to determine Afib burden +/- Bradycardia.  Will be seen in close f/u.  Marykay Lex, MD

## 2013-02-17 NOTE — Care Management Note (Addendum)
    Page 1 of 1   02/17/2013     4:48:27 PM   CARE MANAGEMENT NOTE 02/17/2013  Patient:  KEATS, KINGRY   Account Number:  [id number]  Date Initiated:  02/16/2013  Documentation initiated by:  Kian Gamarra  Subjective/Objective Assessment:   PT S/P AVR ON 02/10/13.  PTA, PT INDEPENDENT, LIVES WITH SPOUSE.     Action/Plan:   WIFE TO PROVIDE CARE AT DC.  MAY NEED HOME O2 AT DC.  WILL NEED ORDER AND QUALIFYING SAT OF 88% OR LESS.   Anticipated DC Date:  02/17/2013   Anticipated DC Plan:  HOME/SELF CARE      DC Planning Services  CM consult      Choice offered to / List presented to:             Status of service:  Completed, signed off Medicare Important Message given?   (If response is "NO", the following Medicare IM given date fields will be blank) Date Medicare IM given:   Date Additional Medicare IM given:    Discharge Disposition:  HOME/SELF CARE  Per UR Regulation:  Reviewed for med. necessity/level of care/duration of stay  If discussed at Long Length of Stay Meetings, dates discussed:   02/17/2013    Comments:  02/17/13 Evangelos Paulino,RN,BSN [phone] HOME O2 ORDERED.  BEDSIDE RN AMBULATED PT FOR QUALIFYING SAT AND IT WAS >88%.  HE DOES NOT QUALIFY FOR HOME O2.  RN TO NOTIFY PA THAT PT DOES NOT NEED HOME O2.

## 2013-02-17 NOTE — Progress Notes (Signed)
Pt amb 414ft on RA. O2 saturation remained above 88% on RA. PA notified of change in pt's needs. Orders received of no need for Home O2. Will continue to monitor pt closely.

## 2013-02-17 NOTE — Progress Notes (Addendum)
       [address]       [place] [address]             [phone]          7 Days Post-Op Procedure(s) (LRB): AORTIC VALVE REPLACEMENT (AVR) (N/A) INTRAOPERATIVE TRANSESOPHAGEAL ECHOCARDIOGRAM (N/A)  Subjective: Feeling well, no complaints.   Objective: Vital signs in last 24 hours: Patient Vitals for the past 24 hrs:  BP Temp Temp src Pulse Resp SpO2 Weight  02/17/13 0516 136/68 mmHg 98.6 F (37 C) Oral 60 18 100 % 132 lb 12.8 oz (60.238 kg)  02/16/13 2049 128/76 mmHg 98.6 F (37 C) Oral 83 17 94 % -  02/16/13 1922 - - - 86 16 97 % -  02/16/13 1332 120/71 mmHg 98.5 F (36.9 C) Oral 65 - 98 % -  02/16/13 1026 131/82 mmHg - - 84 - - -  02/16/13 0954 - - - - - 95 % -   Current Weight  02/17/13 132 lb 12.8 oz (60.238 kg)     Intake/Output from previous day: 09/08 0701 - 09/09 0700 In: 720 [P.O.:720] Out: 1975 [Urine:1975]    PHYSICAL EXAM:  Heart: RRR Lungs: Clear Wound:Clean and dry Extremities: No LE edema   Lab Results: CBC:No results found for this basename: WBC, HGB, HCT, PLT,  in the last 72 hours BMET:  Recent Labs  02/15/13 0520  NA 137  K 3.4*  CL 97  CO2 31  GLUCOSE 121*  BUN 30*  CREATININE 1.08  CALCIUM 9.4    PT/INR: No results found for this basename: LABPROT, INR,  in the last 72 hours    Assessment/Plan: S/P Procedure(s) (LRB): AORTIC VALVE REPLACEMENT (AVR) (N/A) INTRAOPERATIVE TRANSESOPHAGEAL ECHOCARDIOGRAM (N/A) CV- Maintaining SR.  Will roll and tape EPWs.  Continue current meds. BPs stable. Pulm- Home O2 arranged. Continue pulm toilet. Continue current care. Hopefully home soon.     LOS: 7 days    COLLINS,GINA H 02/17/2013  ADDENDUM: Discussed with MD and cardiology.  Since he has not been pacing, will d/c EPWs and watch.  Hopefully home later today if rhythm stable.  Patient feels well, no pacer per cardiology Plan d/c today I have seen and examined Ralph Drake and agree with the above  assessment  and plan.  Delight Ovens MD Beeper [phone] Office [phone] 02/17/2013 2:01 PM

## 2013-02-17 NOTE — Progress Notes (Signed)
Assessment unchanged. Discussed D/C instructions and f/u appointments with patient and wife. Patient verbalized understanding. RX and appointment card given to pt. Removed IV and tele. Pt. Left via W/C accompanied by Nursing Tech.   Genevive Bi, RN

## 2013-02-18 ENCOUNTER — Ambulatory Visit (INDEPENDENT_AMBULATORY_CARE_PROVIDER_SITE_OTHER): Payer: Medicare Other | Admitting: *Deleted

## 2013-02-18 DIAGNOSIS — I4891 Unspecified atrial fibrillation: Secondary | ICD-10-CM

## 2013-02-18 DIAGNOSIS — I480 Paroxysmal atrial fibrillation: Secondary | ICD-10-CM

## 2013-02-18 NOTE — Progress Notes (Deleted)
Pt not in lobby when called.  Front desk to notify when pt returns.  Of note, no monitors available.  Will have to have Cardionet mail monitor.

## 2013-02-19 NOTE — Progress Notes (Signed)
Patient voiced understanding of verbal instruction given. Patient will follow up with RAW as scheduled.

## 2013-02-25 ENCOUNTER — Encounter (HOSPITAL_COMMUNITY): Payer: Medicare Other

## 2013-03-02 ENCOUNTER — Other Ambulatory Visit (HOSPITAL_COMMUNITY): Payer: Self-pay | Admitting: Cardiovascular Disease

## 2013-03-02 DIAGNOSIS — I447 Left bundle-branch block, unspecified: Secondary | ICD-10-CM

## 2013-03-03 ENCOUNTER — Telehealth: Payer: Self-pay | Admitting: Cardiovascular Disease

## 2013-03-03 MED ORDER — METOPROLOL TARTRATE 25 MG PO TABS: 25.0000 mg | ORAL_TABLET | Freq: Two times a day (BID) | ORAL | Status: DC

## 2013-03-03 MED ORDER — ATORVASTATIN CALCIUM 40 MG PO TABS: 40.0000 mg | ORAL_TABLET | Freq: Every day | ORAL | Status: DC

## 2013-03-03 MED ORDER — CILOSTAZOL 50 MG PO TABS: 50.0000 mg | ORAL_TABLET | Freq: Two times a day (BID) | ORAL | Status: DC

## 2013-03-03 MED ORDER — LISINOPRIL 20 MG PO TABS: 20.0000 mg | ORAL_TABLET | Freq: Every day | ORAL | Status: DC

## 2013-03-03 NOTE — Telephone Encounter (Signed)
J C called or sent his prescriptions to the wrong pharmacy.He now need new ones so he can get them from the right pharmacy.Would you please leave them at the front desk.Please call and let him know when you do this.

## 2013-03-03 NOTE — Addendum Note (Signed)
Addended by: Beecher Mcardle R on: 03/03/2013 12:06 PM   Modules accepted: Orders

## 2013-03-03 NOTE — Telephone Encounter (Signed)
Returned call.  Pt stated Rxs sent to wrong mail order.  Need to go to [facility].  Pt informed RN can send electronically to correct pharmacy or pt will have to wait until Dr. Alanda Amass returns to office to sign hard copy Rxs.  Pt declined latter.  Pt would like Rxs sent electronically and informed RN will resend now.  Pt verbalized understanding and agreed w/ plan.  Refill(s) sent to pharmacy via [employer].

## 2013-03-04 ENCOUNTER — Other Ambulatory Visit: Payer: Self-pay | Admitting: Cardiovascular Disease

## 2013-03-04 LAB — COMPREHENSIVE METABOLIC PANEL
ALT: 31 U/L (ref 0–53)
CO2: 28 mEq/L (ref 19–32)
Calcium: 10.4 mg/dL (ref 8.4–10.5)
Chloride: 103 mEq/L (ref 96–112)
Creat: 1.18 mg/dL (ref 0.50–1.35)
Glucose, Bld: 93 mg/dL (ref 70–99)
Sodium: 135 mEq/L (ref 135–145)
Total Protein: 6.5 g/dL (ref 6.0–8.3)

## 2013-03-04 LAB — CBC WITH DIFFERENTIAL/PLATELET
Eosinophils Relative: 4 % (ref 0–5)
HCT: 34.7 % — ABNORMAL LOW (ref 39.0–52.0)
Hemoglobin: 11.7 g/dL — ABNORMAL LOW (ref 13.0–17.0)
Lymphocytes Relative: 29 % (ref 12–46)
Lymphs Abs: 2.3 10*3/uL (ref 0.7–4.0)
MCH: 30.8 pg (ref 26.0–34.0)
MCV: 91.3 fL (ref 78.0–100.0)
Monocytes Relative: 7 % (ref 3–12)
Platelets: 364 10*3/uL (ref 150–400)
RBC: 3.8 MIL/uL — ABNORMAL LOW (ref 4.22–5.81)
WBC: 7.8 10*3/uL (ref 4.0–10.5)

## 2013-03-09 ENCOUNTER — Other Ambulatory Visit: Payer: Self-pay | Admitting: *Deleted

## 2013-03-09 DIAGNOSIS — I359 Nonrheumatic aortic valve disorder, unspecified: Secondary | ICD-10-CM

## 2013-03-12 ENCOUNTER — Ambulatory Visit
Admission: RE | Admit: 2013-03-12 | Discharge: 2013-03-12 | Disposition: A | Discharge status: Home / Self Care | Payer: Medicare Other | Source: Ambulatory Visit | Attending: Cardiothoracic Surgery | Admitting: Cardiothoracic Surgery

## 2013-03-12 ENCOUNTER — Encounter: Payer: Self-pay | Admitting: Cardiothoracic Surgery

## 2013-03-12 ENCOUNTER — Ambulatory Visit (INDEPENDENT_AMBULATORY_CARE_PROVIDER_SITE_OTHER): Payer: Self-pay | Admitting: Cardiothoracic Surgery

## 2013-03-12 VITALS — BP 106/70 | HR 90 | Resp 20 | Ht 65.0 in | Wt 132.0 lb

## 2013-03-12 DIAGNOSIS — I359 Nonrheumatic aortic valve disorder, unspecified: Secondary | ICD-10-CM

## 2013-03-12 DIAGNOSIS — Z952 Presence of prosthetic heart valve: Secondary | ICD-10-CM

## 2013-03-12 DIAGNOSIS — Z954 Presence of other heart-valve replacement: Secondary | ICD-10-CM

## 2013-03-12 DIAGNOSIS — I350 Nonrheumatic aortic (valve) stenosis: Secondary | ICD-10-CM

## 2013-03-12 NOTE — Patient Instructions (Signed)
Endocarditis Information  You may be at risk for developing endocarditis since you have  an artificial heart valve  or a repaired heart valve. Endocarditis is an infection of the lining of the heart or heart valves.   Certain surgical and dental procedures may put you at risk,  such as teeth cleaning or other dental procedures or any surgery involving the respiratory, urinary, gastrointestinal tract, gallbladder or prostate.   Notify your doctor or dentist before having any invasive procedures. You will need to take antibiotics before certain procedures.   To prevent endocarditis, maintain good oral health. Seek prompt medical attention for any mouth/gum, skin or urinary tract infections.   Aortic Valve Replacement Care After Refer to this sheet in the next few weeks. These instructions provide you with information on caring for yourself after your procedure. Your caregiver may also give you specific instructions. Your treatment has been planned according to current medical practices, but problems sometimes occur. Call your caregiver if you have any problems or questions after your procedure. HOME CARE INSTRUCTIONS   Only take over-the-counter or prescription medicines as directed by your caregiver.  Take your temperature every morning for the first 7 days after surgery. Write these down. Call your caregiver if your temperature stays above 100 F (37.8 C) for more than a day.   Weigh yourself every morning for at least 7 days after surgery. Write your weight down to monitor any weight increase.  Wear elastic stockings during the day for at least 2 weeks after surgery. Use them longer if your ankles are swollen. The stockings help blood flow and help reduce swelling in the legs.  Take frequent naps or rest often throughout the day.  Avoid lifting over 10 lbs (4.5 kg) or pushing or pulling things with your arms for 6 8 weeks or as directed by your caregiver.  Avoid driving or airplane  travel for 4 6 weeks after surgery or as directed. If you are riding in a car for an extended period, stop every 1 2 hours to stretch your legs. Keep a record of your medicines and medical history with you when traveling.  Do not cross your legs.  Do not take baths for 4 6 weeks after surgery. Take showers once your caregiver approves. Pat incisions dry. Do not rub incisions with a washcloth or towel.  Avoid climbing stairs and using the handrail to pull yourself up for the first 2 3 weeks after surgery.  Return to work as directed by your caregiver.  Drink enough fluids to keep your urine clear or pale yellow.  Do not strain to have a bowel movement. Eat high-fiber foods if you become constipated. You may also take a medicine to help you have a bowel movement (laxative) as directed by your caregiver.  Resume sexual activity as directed by your caregiver. Men should not use medicines for erectile dysfunction until their doctor says it isokay.  If you had a certain type of heart condition in the past, you may need to take antibiotic medicine before having dental work or surgery. Let your dentist and caregivers know if you had one or more of the following:  Previous endocarditis.  An artificial (prosthetic) heart valve.  Congenital heart disease. SEEK MEDICAL CARE IF:  You develop a skin rash.   Your weight is increasing each day over 2 3 days, or you have a sudden weight gain.  Your weight increases by 2 or more pounds (1 kg or more) in a single  day. SEEK IMMEDIATE MEDICAL CARE IF:   You develop chest pain that is not coming from your incision.   You develop shortness of breath or have difficulty breathing.   You have a fever.   You have increased bleeding from your wounds.   You have increasing wound pain.   You have redness or swelling around your wounds  You have pus coming from your wound.   You develop lightheadedness.  MAKE SURE YOU:   Understand these  directions.  Will watch your condition.  Will get help right away if you are not doing well or get worse. Document Released: 12/14/2004 Document Revised: 05/14/2012 Document Reviewed: 03/11/2012 [facility] Patient Information 2014 [place], [place].

## 2013-03-12 NOTE — Progress Notes (Signed)
[address]       [place] [address]             [phone]                  Ralph FEUERBORN [facility] Medical Record #[id number] Date of Birth: 1934-11-27  Governor Rooks, MD Aura Dials, MD  Chief Complaint:   PostOp Follow Up Visit 02/10/2013  PREOPERATIVE DIAGNOSIS: Critical aortic stenosis.  POSTOPERATIVE DIAGNOSIS: Critical aortic stenosis.  PROCEDURE PERFORMED: Aortic valve replacement with pericardial tissue  valve, [employer], model 3300TFX, 23 mm, serial #[id number].  SURGEON: Sheliah Plane, MD   History of Present Illness:      Patient doing well one month post op, Walking up to 2 miles day without difficulty. No CHF symptoms. He notes he is breathing better. He has stopped smoking   History  Smoking status  . Former Smoker -- 0.50 packs/day for 60 years  . Types: Cigarettes  . Quit date: 01/31/2013  Smokeless tobacco  . Never Used       No Known Allergies  Current Outpatient Prescriptions  Medication Sig Dispense Refill  . amLODipine (NORVASC) 5 MG tablet Take 5 mg by mouth daily.      [name] aspirin EC 325 MG EC tablet Take 1 tablet (325 mg total) by mouth daily.  30 tablet  0  . atorvastatin (LIPITOR) 40 MG tablet Take 1 tablet (40 mg total) by mouth at bedtime.  90 tablet  3  . cilostazol (PLETAL) 50 MG tablet Take 1 tablet (50 mg total) by mouth 2 (two) times daily.  90 tablet  3  . ezetimibe (ZETIA) 10 MG tablet Take 10 mg by mouth daily.      . fish oil-omega-3 fatty acids 1000 MG capsule Take 1 g by mouth daily.      [name] lisinopril (PRINIVIL,ZESTRIL) 20 MG tablet Take 1 tablet (20 mg total) by mouth at bedtime.  90 tablet  3  . metoprolol tartrate (LOPRESSOR) 25 MG tablet Take 1 tablet (25 mg total) by mouth 2 (two) times daily.  180 tablet  3  . multivitamin (THERAGRAN) per tablet Take 1 tablet by mouth daily.      [name] omeprazole (PRILOSEC) 20 MG capsule Take 20 mg by mouth daily.       No current  facility-administered medications for this visit.       Physical Exam: BP 106/70  Pulse 90  Resp 20  Ht 5\' 5"  (1.651 m)  Wt 132 lb (59.875 kg)  BMI 21.97 kg/m2  SpO2 95%  General appearance: alert, cooperative and no distress Neurologic: intact Heart: regular rate and rhythm, S1, S2 normal, no murmur, click, rub or gallop Lungs: clear to auscultation bilaterally Abdomen: soft, non-tender; bowel sounds normal; no masses,  no organomegaly Extremities: extremities normal, atraumatic, no cyanosis or edema and Homans sign is negative, no sign of DVT Wound: sternum well healed   Diagnostic Studies & Laboratory data:         Recent Radiology Findings: Dg Chest 2 View  03/12/2013   *RADIOLOGY REPORT*  Clinical Data: Follow-up aortic stenosis after surgery  CHEST - 2 VIEW  Comparison: 02/14/2013  Findings: Interval resolution of prior right pneumothorax.  Mild cardiac enlargement stable.  Replaced aortic valve unchanged. Vascular pattern within normal limits with no evidence of edema. Right lung is clear.  On the left there is mild opacity in the lower lobe which appears most  consistent with atelectasis.  There are no pleural effusions.  IMPRESSION: Mild left lower lobe atelectasis.   Original Report Authenticated By: Esperanza Heir, M.D.      Recent Labs: Lab Results  Component Value Date   WBC 7.8 03/04/2013   HGB 11.7* 03/04/2013   HCT 34.7* 03/04/2013   PLT 364 03/04/2013   GLUCOSE 93 03/04/2013   CHOL 156 11/28/2012   TRIG 54 11/28/2012   HDL 53 11/28/2012   LDLCALC 92 11/28/2012   ALT 31 03/04/2013   AST 27 03/04/2013   NA 135 03/04/2013   K 5.4* 03/04/2013   CL 103 03/04/2013   CREATININE 1.18 03/04/2013   BUN 21 03/04/2013   CO2 28 03/04/2013   TSH 3.636 01/26/2013   INR 1.43 02/10/2013   HGBA1C 6.3* 02/06/2013      Assessment / Plan:   Doing well post AVR Not Smoking Delay routine dental care 2 months Endocarditis risk and prophylaxis reviewed with patient Will see back as  needed     Teshara Moree B 03/12/2013 3:13 PM

## 2013-03-23 ENCOUNTER — Telehealth: Payer: Self-pay | Admitting: Cardiology

## 2013-03-23 ENCOUNTER — Ambulatory Visit (HOSPITAL_COMMUNITY)
Admission: RE | Admit: 2013-03-23 | Discharge: 2013-03-23 | Disposition: A | Discharge status: Home / Self Care | Payer: Medicare Other | Source: Ambulatory Visit | Attending: Cardiovascular Disease | Admitting: Cardiovascular Disease

## 2013-03-23 DIAGNOSIS — I447 Left bundle-branch block, unspecified: Secondary | ICD-10-CM

## 2013-03-23 DIAGNOSIS — Z954 Presence of other heart-valve replacement: Secondary | ICD-10-CM

## 2013-03-23 HISTORY — PX: TRANSTHORACIC ECHOCARDIOGRAM: SHX275

## 2013-03-23 NOTE — Progress Notes (Signed)
2D Echo Performed 03/23/2013    Arabella Revelle, RCS  

## 2013-03-23 NOTE — Telephone Encounter (Signed)
Pt is here getting an echo. He stated that he had some labs done last month and wore a heart monitor. He is requesting that someone return his call about the results of each.

## 2013-03-24 NOTE — Telephone Encounter (Signed)
Message forwarded to Dr. Harding/Sharon, RN. 

## 2013-03-24 NOTE — Telephone Encounter (Signed)
Echo looks pretty good - valve looks great.  Monitor did show HR 44-88 bpm, no arrythmia. -- would like to cut Metoprolol in 1/2 to 12.5 mg bid.  Labs - all looked good with the exception of Potassium - a bit high.  Drink plenty of liquids over the next few days.  Will need to recheck BMP this week in ~1-2 days.  Marykay Lex, MD

## 2013-04-09 NOTE — Telephone Encounter (Signed)
Spoke to patient. Result ECHO or Montior given . Verbalized understanding  Decrease  METOPROL TART 25 MG TO TAKE 1/2 TABLET TWICE A DAY.

## 2013-05-27 ENCOUNTER — Ambulatory Visit (INDEPENDENT_AMBULATORY_CARE_PROVIDER_SITE_OTHER): Payer: Medicare Other | Admitting: Cardiology

## 2013-05-27 ENCOUNTER — Encounter: Payer: Self-pay | Admitting: Cardiology

## 2013-05-27 VITALS — BP 130/82 | HR 95 | Ht 65.0 in | Wt 141.7 lb

## 2013-05-27 DIAGNOSIS — I2510 Atherosclerotic heart disease of native coronary artery without angina pectoris: Secondary | ICD-10-CM

## 2013-05-27 DIAGNOSIS — I480 Paroxysmal atrial fibrillation: Secondary | ICD-10-CM

## 2013-05-27 DIAGNOSIS — R001 Bradycardia, unspecified: Secondary | ICD-10-CM

## 2013-05-27 DIAGNOSIS — I4891 Unspecified atrial fibrillation: Secondary | ICD-10-CM

## 2013-05-27 DIAGNOSIS — Z79899 Other long term (current) drug therapy: Secondary | ICD-10-CM

## 2013-05-27 DIAGNOSIS — Z952 Presence of prosthetic heart valve: Secondary | ICD-10-CM

## 2013-05-27 DIAGNOSIS — Z9861 Coronary angioplasty status: Secondary | ICD-10-CM

## 2013-05-27 DIAGNOSIS — I739 Peripheral vascular disease, unspecified: Secondary | ICD-10-CM

## 2013-05-27 DIAGNOSIS — I10 Essential (primary) hypertension: Secondary | ICD-10-CM

## 2013-05-27 DIAGNOSIS — E785 Hyperlipidemia, unspecified: Secondary | ICD-10-CM

## 2013-05-27 DIAGNOSIS — I498 Other specified cardiac arrhythmias: Secondary | ICD-10-CM

## 2013-05-27 DIAGNOSIS — Z954 Presence of other heart-valve replacement: Secondary | ICD-10-CM

## 2013-05-27 LAB — LIPID PANEL
Cholesterol: 224 mg/dL — ABNORMAL HIGH (ref 0–200)
Triglycerides: 133 mg/dL (ref <150)

## 2013-05-27 LAB — COMPREHENSIVE METABOLIC PANEL
Albumin: 4 g/dL (ref 3.5–5.2)
BUN: 20 mg/dL (ref 6–23)
Calcium: 10.1 mg/dL (ref 8.4–10.5)
Chloride: 103 mEq/L (ref 96–112)
Glucose, Bld: 94 mg/dL (ref 70–99)
Potassium: 5 mEq/L (ref 3.5–5.3)

## 2013-05-27 MED ORDER — OMEPRAZOLE 20 MG PO CPDR: 20.0000 mg | DELAYED_RELEASE_CAPSULE | Freq: Every day | ORAL | Status: DC

## 2013-05-27 MED ORDER — EZETIMIBE 10 MG PO TABS: 10.0000 mg | ORAL_TABLET | Freq: Every day | ORAL | Status: DC

## 2013-05-27 MED ORDER — AMLODIPINE BESYLATE 5 MG PO TABS: 5.0000 mg | ORAL_TABLET | Freq: Every day | ORAL | Status: DC

## 2013-05-27 MED ORDER — METOPROLOL TARTRATE 25 MG PO TABS: 25.0000 mg | ORAL_TABLET | Freq: Two times a day (BID) | ORAL | Status: DC

## 2013-05-27 NOTE — Assessment & Plan Note (Signed)
His last largely Dopplers were in January 2013. We'll repeat in the next month or so as he is having claudication symptoms. They're not lifestyle limiting.

## 2013-05-27 NOTE — Progress Notes (Signed)
PATIENT: Ralph Drake MRN: [id number]  DOB: May 03, 1935   DOV:05/27/2013 PCP: Aura Dials, MD  Clinic Note: Chief Complaint  Patient presents with  . 4 month visit    no chest pain , no sob ,no edema, ---"been sick about month -resp.", no labs at pcp    HPI: Ralph Drake is a 78 y.o.  male with a PMH below who presents today for followup visit for COPD and history of bowel surgery in September of 2014. He is a former patient of Dr. Susa Griffins, who is now establishing cardiology care with me. I met him at a time and doing his preoperative cardiac catheterization for valve surgery.  His AVR was on 02/2013 with a Edwards Magna 23mm Bovine Pericardial valve. Followup echocardiogram in October revealed excellent seating of the valve with minimal residual gradient.  Interval History: He presents today doing very well for cardiac standpoint. His been off and on 6 for the last month with a bronchitis and upper respiratory symptoms. No fevers or chills just coughing and malaise. Prior to that he was actually walking on daily basis. Yesterday with first time that he got out walked about a mile to mile and half. He said for about the first quarter mile his legs would burn and ache mostly in the left calf. He stopped all times in the first core mile but then was able to walk the rest away without any problems. He denied any anginal discomfort with rest or exertion. No dyspnea at rest or exertion. He says his breathing has been much better since he stopped smoking up until this recent a upper respiratory condition. He denies any PND, orthopnea or edema. No rapid heartbeats palpitations. No lightheadedness, dizziness, wooziness or syncope/near-syncope. No TIA or amaurosis fugax symptoms. He denies any melena, hematochezia or hematuria.   Past Medical History  Diagnosis Date  . CAD S/P percutaneous coronary angioplasty 1993    POBA to Cx x 2 occasions; mild to moderate/nonobstructive CAD on preop  cath August 2014  . Severe aortic stenosis by prior echocardiogram     per Dr. Alanda Amass 2012  . S/P AVR (aortic valve replacement) 02/10/2013    23 Edwards Magna Ease Pericardial Tissue Valve  . Peripheral vascular disease      H/O R SFA stent, L Iliac Stent 1996;; Ultrasound of legs 07/12/2011 -- LABI 0.81, RABI 0.91; RCIA <50%, RCFA 50-69%, RSFA <50% , LCIA <50%, LSFA moderate to severe 70-99% followed by short segment of occlusion, LPOP patent flow lobe a loss of the triple vessel runoff (chronic) ;;  . Shortness of breath     with exertion - pre AVR; resolved  . Hypertension   . Dyslipidemia, goal LDL below 70   . Olecranon bursitis of left elbow   . Trigger ring finger of left hand   . Osteoarthritis   . Bradycardia   . GERD (gastroesophageal reflux disease)     Prior Cardiac Evaluation and Past Surgical History: Past Surgical History  Procedure Laterality Date  . Tonsillectomy      as child  . Right hand surgery    . Trigger finger release  07/23/2011    Procedure: RELEASE TRIGGER FINGER/A-1 PULLEY;  Surgeon: Nilda Simmer, MD;  Location: [facility];  Service: Orthopedics;  Laterality: Left;  release trigger left ring finger patient had supraclavicular block in preop  . Olecranon bursectomy  07/23/2011    Procedure: OLECRANON BURSA;  Surgeon: Nilda Simmer, MD;  Location:  [facility];  Service: Orthopedics;  Laterality: Left;  excision olecranon bursa left elbow patient had supraclaviular block in preop  . Coronary angioplasty  1993    Circumflex x 2   . Rotator cuff repair Right   . Aortic valve replacement  02/10/2013    Dr. Tyrone Sage: [address] Ease Pericardia  . Iliac artery stent Left 1996  . Superficial femoral atery stent Right 1996  . Aortic valve replacement N/A 02/10/2013    Procedure: AORTIC VALVE REPLACEMENT (AVR);  Surgeon: Delight Ovens, MD;  Location: [facility] OR;  Service: Open Heart Surgery;  Laterality: N/A;  . Intraoperative  transesophageal echocardiogram N/A 02/10/2013    Procedure: INTRAOPERATIVE TRANSESOPHAGEAL ECHOCARDIOGRAM;  Surgeon: Delight Ovens, MD;  Location: [facility] OR;  Service: Open Heart Surgery;  Laterality: N/A;  . Transthoracic echocardiogram  03/23/2013    Post-Op Echo #1: EF 50-55%, Mild Conc LVH, Mild HK of Inferolateral wall with paradoxical septal motion (c/w post-op state); Well seated 23 mm Bioprosthetic Valve (area ~1.23 cm2 - normal for valve); Mild MR  . Cardiac catheterization  August 2014    Nonobstructive 30-50% RCA. No significant LAD disease. 30% mid circumflex.    No Known Allergies  Current Outpatient Prescriptions  Medication Sig Dispense Refill  . amLODipine (NORVASC) 5 MG tablet Take 1 tablet (5 mg total) by mouth daily.  90 tablet  3  . aspirin EC 325 MG EC tablet Take 1 tablet (325 mg total) by mouth daily.  30 tablet  0  . atorvastatin (LIPITOR) 40 MG tablet Take 1 tablet (40 mg total) by mouth at bedtime.  90 tablet  3  . cilostazol (PLETAL) 50 MG tablet Take 1 tablet (50 mg total) by mouth 2 (two) times daily.  90 tablet  3  . ezetimibe (ZETIA) 10 MG tablet Take 1 tablet (10 mg total) by mouth daily.  90 tablet  3  . fish oil-omega-3 fatty acids 1000 MG capsule Take 1 g by mouth daily.      [name] lisinopril (PRINIVIL,ZESTRIL) 20 MG tablet Take 1 tablet (20 mg total) by mouth at bedtime.  90 tablet  3  . multivitamin (THERAGRAN) per tablet Take 1 tablet by mouth daily.      [name] omeprazole (PRILOSEC) 20 MG capsule Take 1 capsule (20 mg total) by mouth daily.  90 capsule  3  . metoprolol tartrate (LOPRESSOR) 25 MG tablet Take 1 tablet (25 mg total) by mouth 2 (two) times daily.  180 tablet  3   No current facility-administered medications for this visit.    History   Social History Narrative   Married with no children. He long-term smoker who quit prior to his valve replacement surgery earlier this year.   Up until his operation he worked part-time at [employer].  He  is not yet gone back to work postoperatively.   He chose not be cardiac rehabilitation, but has gone back to walking exercising daily.   family history includes Hypertension in his mother.  ROS: A comprehensive Review of Systems - Negative except Coughing and wheezing somewhat productive cough. Mild myalgias and just general fatigue/malaise.  PHYSICAL EXAM BP 130/82  Pulse 95  Ht 5\' 5"  (1.651 m)  Wt 141 lb 11.2 oz (64.275 kg)  BMI 23.58 kg/m2 General appearance: alert and oriented x3, cooperative, appears stated age, no distress and well-nourished/well-groomed. Answers questions appropriately. HEENT: [place]/AT, EOMI, MMM, anicteric sclera Neck: no adenopathy, no JVD, supple, symmetrical, trachea midline, thyroid  not enlarged, symmetric, no tenderness/mass/nodules and faint carotid bruits bilaterally; normal carotid upstroke without delay Lungs: clear to auscultation bilaterally, normal percussion bilaterally and nonlabored, good air movement. Just mostly diminished throughout. No rales or rhonchi. Heart: normal apical impulse, regular rate and rhythm, S1: normal, S2: physiologically split, systolic murmur: early systolic 1/6, crescendo and decrescendo at 2nd right intercostal space, radiates to carotids, no click, no rub and no other murmur. Abdomen: soft, non-tender; bowel sounds normal; no masses,  no organomegaly Extremities: extremities normal, atraumatic, no cyanosis or edema Pulses: Significantly diminished lower 70 pulses. Palpable but faint/1+ right DP and PT. Left DP PT are thready to barely palpable. Neurologic: Grossly normal  XWR:[id number] today: Yes Rate: 95 , Rhythm: NSR. RBBB, LAFB, LVH with repolarization abnormalities. Only change from previous ECGs rate is increased.  Recent Labs: 05/27/2013  TC 224, TG 133, LDL 130; HDL 61.  LFTs normal. Potassium 5.0, creatinine 1.25.  ASSESSMENT / PLAN: S/P tissue AVR 02/11/13 He seems to be doing very well postoperatively. He  echocardiogram revealed excellent result. The murmurs all but gone. He no longer has the exertional dyspnea symptoms. Would simply followup echocardiogram next year, and then depending on how looks every 1-2 years.  Coronary atherosclerosis of native coronary artery His distant history of angioplasty in the circumflex has held for her almost 20 years. He denies any anginal symptoms to date. He remains on aspirin and statin beta blocker and ACE inhibitor.   He actually just quit smoking as well. His last catheterization revealed only mild mild to moderate disease.   Pla continue current regimen, but will increase beta blocker back up to 25 mg twice a day from 12.5 mg twice a day. He was somewhat bradycardic in hospital but that has since resolved.  Claudication of left lower extremity He does have known peripheral vascular disease and has claudication clearly in his left greater than right. This would be consistent with inadequate disease noted. His last Dopplers were well over a year ago. I will simply repeat low STEMI arterial Dopplers and want to determine whether or not there is any percutaneous options versus the potential need to consider surgical options. For now his claudication is far from lifestyle limiting.  Plan: Continue current cardiac medications plus Pletal. Hopefully with his smoking cessation the progression of disease will slow.  PAF (paroxysmal atrial fibrillation), post op He has this diagnosis listed, but was not noted by Dr. Alanda Amass at all. My suspicion is that he had post-operative atrial fibrillation and he is not had any symptoms to suggest a recurrence. For now I would just simply keep him on aspirin as well as his shows no recurrence.  Bradycardia - can only tolerate low dose BB, continues to need temp pacer While in the hospital, he had significant bradycardia and with his postop A. fib. She is currently recovered from that. An increase beta blocker back to 25 mg twice  a day.  Hypertension Well-controlled today. Continue current management.  Dyslipidemia, goal LDL below 70 I just ordered labs for him today. The labs above were from this afternoon. His total cholesterol has gone up dramatically since June. He is on atorvastatin plus Zetia plus fistula. At the Zetia was recently started. Most of the need to follow these up in about 6 months to see how they're trending. I am not sure if he was fasting at the time of his labs.  Peripheral vascular disease - s/p L Iliac stent His last largely Dopplers were in  January 2013. We'll repeat in the next month or so as he is having claudication symptoms. They're not lifestyle limiting.    Orders Placed This Encounter  Procedures  . Lipid panel    Order Specific Question:  Has the patient fasted?    Answer:  Yes  . Comprehensive metabolic panel    Order Specific Question:  Has the patient fasted?    Answer:  Yes  . EKG 12-Lead  . Lower Extremity Venous Duplex    Dx claudication, PVD    Standing Status: Future     Number of Occurrences:      Standing Expiration Date: 05/27/2014    Order Specific Question:  Where should this test be performed:    Answer:  [facility]   Meds ordered this encounter  Medications  . metoprolol tartrate (LOPRESSOR) 25 MG tablet    Sig: Take 1 tablet (25 mg total) by mouth 2 (two) times daily.    Dispense:  180 tablet    Refill:  3    Followup: Roughly 4 months  Ted Goodner W. Herbie Baltimore, M.D., M.S. [facility] [address] [place], [place]  [address]  [phone] Pager # [phone]

## 2013-05-27 NOTE — Assessment & Plan Note (Signed)
He seems to be doing very well postoperatively. He echocardiogram revealed excellent result. The murmurs all but gone. He no longer has the exertional dyspnea symptoms. Would simply followup echocardiogram next year, and then depending on how looks every 1-2 years.

## 2013-05-27 NOTE — Assessment & Plan Note (Signed)
His distant history of angioplasty in the circumflex has held for her almost 20 years. He denies any anginal symptoms to date. He remains on aspirin and statin beta blocker and ACE inhibitor.   He actually just quit smoking as well. His last catheterization revealed only mild mild to moderate disease.   Pla continue current regimen, but will increase beta blocker back up to 25 mg twice a day from 12.5 mg twice a day. He was somewhat bradycardic in hospital but that has since resolved.

## 2013-05-27 NOTE — Assessment & Plan Note (Signed)
He has this diagnosis listed, but was not noted by Dr. Alanda Amass at all. My suspicion is that he had post-operative atrial fibrillation and he is not had any symptoms to suggest a recurrence. For now I would just simply keep him on aspirin as well as his shows no recurrence.

## 2013-05-27 NOTE — Assessment & Plan Note (Signed)
Well-controlled today.  Continue current management. 

## 2013-05-27 NOTE — Assessment & Plan Note (Signed)
He does have known peripheral vascular disease and has claudication clearly in his left greater than right. This would be consistent with inadequate disease noted. His last Dopplers were well over a year ago. I will simply repeat low STEMI arterial Dopplers and want to determine whether or not there is any percutaneous options versus the potential need to consider surgical options. For now his claudication is far from lifestyle limiting.  Plan: Continue current cardiac medications plus Pletal. Hopefully with his smoking cessation the progression of disease will slow.

## 2013-05-27 NOTE — Assessment & Plan Note (Signed)
While in the hospital, he had significant bradycardia and with his postop A. fib. She is currently recovered from that. An increase beta blocker back to 25 mg twice a day.

## 2013-05-27 NOTE — Patient Instructions (Signed)
Labs CMP,LIPID  Your physician has requested that you have a lower  extremity venous duplex for claudication. This test is an ultrasound of the veins in the legs. It looks at venous blood flow that carries blood from the heart to the legs. Allow one hour for a Lower Venous exam. There are no restrictions or special instructions.  Restart Metoprolol tart 25 mg twice a day  Your physician wants you to follow-up in 4 months with Dr Herbie Baltimore.  You will receive a reminder letter in the mail two months in advance. If you don't receive a letter, please call our office to schedule the follow-up appointment.

## 2013-05-27 NOTE — Assessment & Plan Note (Signed)
I just ordered labs for him today. The labs above were from this afternoon. His total cholesterol has gone up dramatically since June. He is on atorvastatin plus Zetia plus fistula. At the Zetia was recently started. Most of the need to follow these up in about 6 months to see how they're trending. I am not sure if he was fasting at the time of his labs.

## 2013-05-28 ENCOUNTER — Encounter: Payer: Self-pay | Admitting: Cardiology

## 2013-05-29 ENCOUNTER — Other Ambulatory Visit (HOSPITAL_COMMUNITY): Payer: Self-pay | Admitting: Cardiology

## 2013-05-29 ENCOUNTER — Ambulatory Visit (HOSPITAL_COMMUNITY)
Admission: RE | Admit: 2013-05-29 | Discharge: 2013-05-29 | Disposition: A | Discharge status: Home / Self Care | Payer: Medicare Other | Source: Ambulatory Visit | Attending: Cardiovascular Disease | Admitting: Cardiovascular Disease

## 2013-05-29 ENCOUNTER — Telehealth: Payer: Self-pay | Admitting: *Deleted

## 2013-05-29 DIAGNOSIS — I739 Peripheral vascular disease, unspecified: Secondary | ICD-10-CM

## 2013-05-29 DIAGNOSIS — Z79899 Other long term (current) drug therapy: Secondary | ICD-10-CM

## 2013-05-29 DIAGNOSIS — E782 Mixed hyperlipidemia: Secondary | ICD-10-CM

## 2013-05-29 NOTE — Telephone Encounter (Signed)
Message copied by Tobin Chad on Fri May 29, 2013 12:53 PM ------      Message from: Marykay Lex      Created: Thu May 28, 2013  6:48 PM       :-( Total cholesterol & LDL have gone up!      Need to be on meds like we discussed in clinic -- will recheck in 6 months.      Can these be forwarded to DR. Bouska?            Marykay Lex, MD       ------

## 2013-05-29 NOTE — Progress Notes (Signed)
Lower Extremity Arterial Duplex Completed. °Brianna L Mazza,RVT °

## 2013-05-29 NOTE — Telephone Encounter (Signed)
Patient aware to stay with current medication. Recheck in 6 month

## 2013-05-29 NOTE — Telephone Encounter (Signed)
Lets just recheck in 6 months.  May be lab error.

## 2013-05-29 NOTE — Telephone Encounter (Signed)
Spoke to wife.  labs Result given . Verbalized understanding Wife states he has been taking Atorvastatin and Zetia daily.   will inform Dr Herbie Baltimore to see if any changes are needed.  ROUTED labs to DR [facility]  Lab ordered for 6 month -cmp lipid

## 2013-07-06 ENCOUNTER — Telehealth: Payer: Self-pay | Admitting: *Deleted

## 2013-07-06 NOTE — Telephone Encounter (Signed)
Pt stated that he talked to you the other day regarding some procedure and he would like to talk to you about this.  [place]

## 2013-07-07 NOTE — Telephone Encounter (Signed)
Ivin Booty, did he talk to you?

## 2013-07-08 NOTE — Telephone Encounter (Signed)
Last message was concerning labs results. Ralph Drake spoke to patient about lower extremities doppler

## 2013-07-08 NOTE — Telephone Encounter (Signed)
I spoke with patient and he wants to proceed and schedule an appt with JB.  appt made

## 2013-08-19 ENCOUNTER — Encounter: Payer: Self-pay | Admitting: Cardiovascular Disease

## 2013-08-19 ENCOUNTER — Ambulatory Visit (INDEPENDENT_AMBULATORY_CARE_PROVIDER_SITE_OTHER): Payer: Medicare Other | Admitting: Cardiovascular Disease

## 2013-08-19 VITALS — BP 172/100 | HR 76 | Ht 65.0 in | Wt 148.3 lb

## 2013-08-19 DIAGNOSIS — Z01818 Encounter for other preprocedural examination: Secondary | ICD-10-CM

## 2013-08-19 DIAGNOSIS — D689 Coagulation defect, unspecified: Secondary | ICD-10-CM

## 2013-08-19 DIAGNOSIS — R5381 Other malaise: Secondary | ICD-10-CM

## 2013-08-19 DIAGNOSIS — R5383 Other fatigue: Secondary | ICD-10-CM

## 2013-08-19 DIAGNOSIS — I739 Peripheral vascular disease, unspecified: Secondary | ICD-10-CM

## 2013-08-19 NOTE — Assessment & Plan Note (Signed)
The patient was referred to me by Dr. Glenetta Hew for peripheral vascular evaluation. He has a history of left common iliac artery stent in 1996 and right SFA PTA. He does have vascular disease otherwise including his coronary arteries and has had aortic valve replacement with a bioprosthetic valve last year. He complains of lifestyle limiting claudication left greater than right. His Dopplers of the year since 2006 has shown progressive weight worsening disease in his mid left SFA which became occlusive plaque in August of 2013. Recent Dopplers performed 05/29/13 revealed a right ABI 0.8 with a high-frequency signal in the right common iliac artery and a left ABI of 0.69 with an occluded left SFA. Based on this the patient's symptoms are going to proceed with angiography and potential percutaneous intervention.

## 2013-08-19 NOTE — Patient Instructions (Signed)
Dr Gwenlyn Found had recommended you have a PV angiogram of your leg.    You will need to have blood work done 3-5 days prior to your procedure date - please use a [facility] lab  [place]

## 2013-08-19 NOTE — Progress Notes (Signed)
08/19/2013 Van Clines Cravens   01/23/1935  [id number]  Primary Physician Phineas Inches, MD Primary Cardiologist: [name] MD Renae Gloss   HPI:  Mr. Strahm a 79 year old Caucasian male former patient of Dr. Terance Ice, currently cared for by Dr. Glenetta Hew, referred for peripheral vasodilation because of a limiting claudication. He is initially remote PTCA of his circumflex by Dr. Luiz Blare to the myself back in 1993. He had severe aortic stenosis and had a tissue AVR 02/11/13. He's had a left common iliac artery stent in 1996 along with a right SFA PTA. His other problems include hypertension and hyperlipidemia. Since his operation he has tried to walk but has gained weight. He has left greater than right lower decortication. Doppler suggests an occluded left SFA with high-grade right common iliac artery stenosis.    Current Outpatient Prescriptions  Medication Sig Dispense Refill  . amLODipine (NORVASC) 5 MG tablet Take 1 tablet (5 mg total) by mouth daily.  90 tablet  3  . aspirin EC 325 MG EC tablet Take 1 tablet (325 mg total) by mouth daily.  30 tablet  0  . atorvastatin (LIPITOR) 40 MG tablet Take 1 tablet (40 mg total) by mouth at bedtime.  90 tablet  3  . cilostazol (PLETAL) 50 MG tablet Take 1 tablet (50 mg total) by mouth 2 (two) times daily.  90 tablet  3  . ezetimibe (ZETIA) 10 MG tablet Take 1 tablet (10 mg total) by mouth daily.  90 tablet  3  . fish oil-omega-3 fatty acids 1000 MG capsule Take 1 g by mouth daily.      [name] lisinopril (PRINIVIL,ZESTRIL) 20 MG tablet Take 1 tablet (20 mg total) by mouth at bedtime.  90 tablet  3  . metoprolol tartrate (LOPRESSOR) 25 MG tablet Take 1 tablet (25 mg total) by mouth 2 (two) times daily.  180 tablet  3  . multivitamin (THERAGRAN) per tablet Take 1 tablet by mouth daily.      [name] omeprazole (PRILOSEC) 20 MG capsule Take 1 capsule (20 mg total) by mouth daily.  90 capsule  3   No current facility-administered  medications for this visit.    No Known Allergies  History   Social History  . Marital Status: Married    Spouse Name: N/A    Number of Children: N/A  . Years of Education: N/A   Occupational History  . Not on file.   Social History Main Topics  . Smoking status: Former Smoker -- 0.50 packs/day for 60 years    Types: Cigarettes    Quit date: 01/31/2013  . Smokeless tobacco: Never Used  . Alcohol Use: No  . Drug Use: No  . Sexual Activity: Not on file   Other Topics Concern  . Not on file   Social History Narrative   Married with no children. He long-term smoker who quit prior to his valve replacement surgery earlier this year.   Up until his operation he worked part-time at [employer].  He is not yet gone back to work postoperatively.   He chose not be cardiac rehabilitation, but has gone back to walking exercising daily.     Review of Systems: General: negative for chills, fever, night sweats or weight changes.  Cardiovascular: negative for chest pain, dyspnea on exertion, edema, orthopnea, palpitations, paroxysmal nocturnal dyspnea or shortness of breath Dermatological: negative for rash Respiratory: negative for cough or wheezing Urologic: negative for hematuria Abdominal: negative for  nausea, vomiting, diarrhea, bright red blood per rectum, melena, or hematemesis Neurologic: negative for visual changes, syncope, or dizziness All other systems reviewed and are otherwise negative except as noted above.    Blood pressure 172/100, pulse 76, height 5\' 5"  (1.651 m), weight 148 lb 4.8 oz (67.268 kg).  General appearance: alert and no distress Neck: no adenopathy, no JVD, supple, symmetrical, trachea midline, thyroid not enlarged, symmetric, no tenderness/mass/nodules and right carotid bruit Lungs: clear to auscultation bilaterally Heart: regular rate and rhythm, S1, S2 normal, no murmur, click, rub or gallop Extremities: extremities normal, atraumatic, no  cyanosis or edema and 2+ femorals with bruits bilaterally. 2+ right pedal pulse, absent left pedal pulse  EKG not performed today  ASSESSMENT AND PLAN:   Peripheral vascular disease - s/p L Iliac stent The patient was referred to me by Dr. Glenetta Hew for peripheral vascular evaluation. He has a history of left common iliac artery stent in 1996 and right SFA PTA. He does have vascular disease otherwise including his coronary arteries and has had aortic valve replacement with a bioprosthetic valve last year. He complains of lifestyle limiting claudication left greater than right. His Dopplers of the year since 2006 has shown progressive weight worsening disease in his mid left SFA which became occlusive plaque in August of 2013. Recent Dopplers performed 05/29/13 revealed a right ABI 0.8 with a high-frequency signal in the right common iliac artery and a left ABI of 0.69 with an occluded left SFA. Based on this the patient's symptoms are going to proceed with angiography and potential percutaneous intervention.      [name] MD FACP,FACC,FAHA, [facility] 08/19/2013 10:11 AM

## 2013-08-20 ENCOUNTER — Other Ambulatory Visit: Payer: Self-pay | Admitting: *Deleted

## 2013-08-20 DIAGNOSIS — Z01818 Encounter for other preprocedural examination: Secondary | ICD-10-CM

## 2013-08-31 ENCOUNTER — Encounter (HOSPITAL_COMMUNITY): Payer: Self-pay | Admitting: Pharmacy Technician

## 2013-09-02 LAB — BASIC METABOLIC PANEL
BUN: 25 mg/dL — ABNORMAL HIGH (ref 6–23)
CO2: 24 meq/L (ref 19–32)
CREATININE: 1.23 mg/dL (ref 0.50–1.35)
Calcium: 10.2 mg/dL (ref 8.4–10.5)
Chloride: 104 mEq/L (ref 96–112)
GLUCOSE: 102 mg/dL — AB (ref 70–99)
Potassium: 5.2 mEq/L (ref 3.5–5.3)
Sodium: 137 mEq/L (ref 135–145)

## 2013-09-02 LAB — CBC
HCT: 39.9 % (ref 39.0–52.0)
HEMOGLOBIN: 13.6 g/dL (ref 13.0–17.0)
MCH: 29.5 pg (ref 26.0–34.0)
MCHC: 34.1 g/dL (ref 30.0–36.0)
MCV: 86.6 fL (ref 78.0–100.0)
PLATELETS: 213 10*3/uL (ref 150–400)
RBC: 4.61 MIL/uL (ref 4.22–5.81)
RDW: 16.8 % — ABNORMAL HIGH (ref 11.5–15.5)
WBC: 5.8 10*3/uL (ref 4.0–10.5)

## 2013-09-02 LAB — TSH: TSH: 4.698 u[IU]/mL — ABNORMAL HIGH (ref 0.350–4.500)

## 2013-09-02 LAB — PROTIME-INR
INR: 0.95 (ref <1.50)
PROTHROMBIN TIME: 12.6 s (ref 11.6–15.2)

## 2013-09-02 LAB — APTT: aPTT: 29 seconds (ref 24–37)

## 2013-09-10 ENCOUNTER — Encounter (HOSPITAL_COMMUNITY)
Admission: RE | Disposition: A | Discharge status: Home / Self Care | Payer: Self-pay | Source: Ambulatory Visit | Attending: Cardiovascular Disease

## 2013-09-10 ENCOUNTER — Ambulatory Visit (HOSPITAL_COMMUNITY)
Admission: RE | Admit: 2013-09-10 | Discharge: 2013-09-10 | Disposition: A | Discharge status: Home / Self Care | Payer: Medicare Other | Source: Ambulatory Visit | Attending: Cardiovascular Disease | Admitting: Cardiovascular Disease

## 2013-09-10 DIAGNOSIS — I714 Abdominal aortic aneurysm, without rupture, unspecified: Secondary | ICD-10-CM | POA: Insufficient documentation

## 2013-09-10 DIAGNOSIS — E785 Hyperlipidemia, unspecified: Secondary | ICD-10-CM | POA: Insufficient documentation

## 2013-09-10 DIAGNOSIS — I70219 Atherosclerosis of native arteries of extremities with intermittent claudication, unspecified extremity: Secondary | ICD-10-CM | POA: Insufficient documentation

## 2013-09-10 DIAGNOSIS — Z7982 Long term (current) use of aspirin: Secondary | ICD-10-CM | POA: Insufficient documentation

## 2013-09-10 DIAGNOSIS — I739 Peripheral vascular disease, unspecified: Secondary | ICD-10-CM

## 2013-09-10 DIAGNOSIS — Z87891 Personal history of nicotine dependence: Secondary | ICD-10-CM | POA: Insufficient documentation

## 2013-09-10 DIAGNOSIS — R635 Abnormal weight gain: Secondary | ICD-10-CM | POA: Insufficient documentation

## 2013-09-10 DIAGNOSIS — Z01818 Encounter for other preprocedural examination: Secondary | ICD-10-CM

## 2013-09-10 DIAGNOSIS — I10 Essential (primary) hypertension: Secondary | ICD-10-CM | POA: Insufficient documentation

## 2013-09-10 DIAGNOSIS — Z954 Presence of other heart-valve replacement: Secondary | ICD-10-CM | POA: Insufficient documentation

## 2013-09-10 HISTORY — PX: LOWER EXTREMITY ANGIOGRAM: SHX5508

## 2013-09-10 HISTORY — PX: OTHER SURGICAL HISTORY: SHX169

## 2013-09-10 HISTORY — PX: ABDOMINAL ANGIOGRAM: SHX5499

## 2013-09-10 SURGERY — ANGIOGRAM, LOWER EXTREMITY
Anesthesia: LOCAL

## 2013-09-10 MED ORDER — LIDOCAINE HCL (PF) 1 % IJ SOLN
INTRAMUSCULAR | Status: AC
  Filled 2013-09-10: qty 30

## 2013-09-10 MED ORDER — SODIUM CHLORIDE 0.9 % IV SOLN
INTRAVENOUS | Status: DC
  Administered 2013-09-10: 08:00:00 via INTRAVENOUS

## 2013-09-10 MED ORDER — MORPHINE SULFATE 2 MG/ML IJ SOLN: 1.0000 mg | INTRAMUSCULAR | Status: DC | PRN

## 2013-09-10 MED ORDER — SODIUM CHLORIDE 0.9 % IV SOLN: INTRAVENOUS | Status: DC

## 2013-09-10 MED ORDER — ASPIRIN 81 MG PO CHEW: 81.0000 mg | CHEWABLE_TABLET | ORAL | Status: DC

## 2013-09-10 MED ORDER — ASPIRIN EC 325 MG PO TBEC: 325.0000 mg | DELAYED_RELEASE_TABLET | Freq: Every day | ORAL | Status: DC

## 2013-09-10 MED ORDER — SODIUM CHLORIDE 0.9 % IJ SOLN: 3.0000 mL | INTRAMUSCULAR | Status: DC | PRN

## 2013-09-10 MED ORDER — DIAZEPAM 5 MG PO TABS
5.0000 mg | ORAL_TABLET | ORAL | Status: AC
  Administered 2013-09-10: 5 mg via ORAL
  Filled 2013-09-10: qty 1

## 2013-09-10 MED ORDER — HEPARIN (PORCINE) IN NACL 2-0.9 UNIT/ML-% IJ SOLN
INTRAMUSCULAR | Status: AC
  Filled 2013-09-10: qty 1000

## 2013-09-10 NOTE — Discharge Instructions (Signed)
Angiography, Care After Refer to this sheet in the next few weeks. These instructions provide you with information on caring for yourself after your procedure. Your health care provider may also give you more specific instructions. Your treatment has been planned according to current medical practices, but problems sometimes occur. Call your health care provider if you have any problems or questions after your procedure.  WHAT TO EXPECT AFTER THE PROCEDURE After your procedure, it is typical to have the following sensations:  Minor discomfort or tenderness and a small bump at the catheter insertion site. The bump should usually decrease in size and tenderness within 1 to 2 weeks.  Any bruising will usually fade within 2 to 4 weeks. HOME CARE INSTRUCTIONS   You may need to keep taking blood thinners if they were prescribed for you. Only take over-the-counter or prescription medicines for pain, fever, or discomfort as directed by your health care provider.  Do not apply powder or lotion to the site.  Do not sit in a bathtub, swimming pool, or whirlpool for 5 to 7 days.  You may shower 24 hours after the procedure. Remove the bandage (dressing) and gently wash the site with plain soap and water. Gently pat the site dry.  Inspect the site at least twice daily.  Limit your activity for the first 24 hours. Do not bend, squat, or lift anything over 10 lb (9 kg) or as directed by your health care provider.  Do not drive home if you are discharged the day of the procedure. Have someone else drive you. Follow instructions about when you can drive or return to work. SEEK MEDICAL CARE IF:  You get lightheaded when standing up.  You have drainage (other than a small amount of blood on the dressing).  You have chills.  You have a fever.  You have redness, warmth, swelling, or pain at the insertion site. SEEK IMMEDIATE MEDICAL CARE IF:   You develop chest pain or shortness of breath, feel faint,  or pass out.  You have bleeding, swelling larger than a walnut, or drainage from the catheter insertion site.  You develop pain, discoloration, coldness, or severe bruising in the leg or arm that held the catheter.  You have heavy bleeding from the site. If this happens, hold pressure on the site. MAKE SURE YOU:  Understand these instructions.  Will watch your condition.  Will get help right away if you are not doing well or get worse. Document Released: 12/14/2004 Document Revised: 01/28/2013 Document Reviewed: 10/20/2012 [facility] Patient Information 2014 [place].

## 2013-09-10 NOTE — Interval H&P Note (Signed)
History and Physical Interval Note:  09/10/2013 9:18 AM  Ralph Drake  has presented today for surgery, with the diagnosis of pad/claudication  The various methods of treatment have been discussed with the patient and family. After consideration of risks, benefits and other options for treatment, the patient has consented to  Procedure(s): LOWER EXTREMITY ANGIOGRAM (N/A) ABDOMINAL ANGIOGRAM as a surgical intervention .  The patient's history has been reviewed, patient examined, no change in status, stable for surgery.  I have reviewed the patient's chart and labs.  Questions were answered to the patient's satisfaction.     [name]

## 2013-09-10 NOTE — CV Procedure (Signed)
Ralph Drake is a 79 y.o. male    [id number] LOCATION:  FACILITY: [facility]  PHYSICIAN: Quay Burow, M.D. [birth year]   DATE OF PROCEDURE:  09/10/2013  DATE OF DISCHARGE:     PV Angiogram/Intervention    History obtained from chart review.[name] a 79 year old Caucasian male former patient of Dr. Terance Ice, currently cared for by Dr. Glenetta Hew, referred for peripheral vascular evaluation because of lifestyle limiting claudication. He history is remarkable for  remote PTCA of his circumflex by  myself back in 1993. He had severe aortic stenosis and had a tissue AVR 02/11/13. He's had a left common iliac artery stent in 1996 along with a right SFA PTA. His other problems include hypertension and hyperlipidemia. Since his operation he has tried to walk but has gained weight. He has left greater than right lower decortication. Doppler suggests an occluded left SFA with high-grade right common iliac artery stenosis.    PROCEDURE DESCRIPTION:   The patient was brought to the second floor [facility] cath lab in the postabsorptive state. He was premedicated with Valium by mouth. His right groinwas prepped and shaved in usual sterile fashion. Xylocaine 1% was used for local anesthesia. A 5 French sheath was inserted into the right common femoral artery using standard Seldinger technique. A 5 French pigtail catheter was placed in the distal abdominal aorta. Distal abdominal aortography, bilateral iliac angiography and bifemoral runoff were performed using bolus chase digital subtraction step staple technique. Contralateral access obtained with a crossover catheter and end hole catheter which was placed at the level of the left external iliac artery.  HEMODYNAMICS:    AO SYSTOLIC/AO DIASTOLIC: 220/25   Angiographic Data:   1. Abdominal aortogram-mild abdominal aortic dilatation consistent with a small abdominal aortic aneurysm  2: Left lower extremity-occluded left SFA  just beyond the origin with reconstitution in the adductor canal and three-vessel runoff. The entire occluded segment was heavily calcified fluoroscopically.  3: Right lower extremity-50% ostial right common iliac artery stenosis. There was a 20 mm pullback gradient after administration of 200 mcg of intra-arterial occlusion using a 5 French endhole catheter. There was three-vessel runoff  IMPRESSION:occluded left SFA probably not amenable to percutaneous revascularization. This would require a left femoropopliteal bypass grafting. I do not think the right common iliac artery ostium was examined at that is significant up to intervene on , and I recommend continued medical therapy. The sheath was removed and pressure was held on the groin to achieve hemostasis. The patient left the lab in stable condition. He'll be gently hydrated over the next 4 hours and remained recumbent. He will be discharged home and followup in my office in 2-3 weeks.    [name] MD, [facility] 09/10/2013 9:21 AM

## 2013-09-10 NOTE — H&P (View-Only) (Signed)
08/19/2013 Van Clines Carton   08-Apr-1935  [id number]  Primary Physician Phineas Inches, MD Primary Cardiologist: [name] MD Renae Gloss   HPI:  [name] a 79 year old Caucasian male former patient of Dr. Terance Ice, currently cared for by Dr. Glenetta Hew, referred for peripheral vasodilation because of a limiting claudication. He is initially remote PTCA of his circumflex by Dr. Luiz Blare to the myself back in 1993. He had severe aortic stenosis and had a tissue AVR 02/11/13. He's had a left common iliac artery stent in 1996 along with a right SFA PTA. His other problems include hypertension and hyperlipidemia. Since his operation he has tried to walk but has gained weight. He has left greater than right lower decortication. Doppler suggests an occluded left SFA with high-grade right common iliac artery stenosis.    Current Outpatient Prescriptions  Medication Sig Dispense Refill  . amLODipine (NORVASC) 5 MG tablet Take 1 tablet (5 mg total) by mouth daily.  90 tablet  3  . aspirin EC 325 MG EC tablet Take 1 tablet (325 mg total) by mouth daily.  30 tablet  0  . atorvastatin (LIPITOR) 40 MG tablet Take 1 tablet (40 mg total) by mouth at bedtime.  90 tablet  3  . cilostazol (PLETAL) 50 MG tablet Take 1 tablet (50 mg total) by mouth 2 (two) times daily.  90 tablet  3  . ezetimibe (ZETIA) 10 MG tablet Take 1 tablet (10 mg total) by mouth daily.  90 tablet  3  . fish oil-omega-3 fatty acids 1000 MG capsule Take 1 g by mouth daily.      [name] lisinopril (PRINIVIL,ZESTRIL) 20 MG tablet Take 1 tablet (20 mg total) by mouth at bedtime.  90 tablet  3  . metoprolol tartrate (LOPRESSOR) 25 MG tablet Take 1 tablet (25 mg total) by mouth 2 (two) times daily.  180 tablet  3  . multivitamin (THERAGRAN) per tablet Take 1 tablet by mouth daily.      [name] omeprazole (PRILOSEC) 20 MG capsule Take 1 capsule (20 mg total) by mouth daily.  90 capsule  3   No current facility-administered  medications for this visit.    No Known Allergies  History   Social History  . Marital Status: Married    Spouse Name: N/A    Number of Children: N/A  . Years of Education: N/A   Occupational History  . Not on file.   Social History Main Topics  . Smoking status: Former Smoker -- 0.50 packs/day for 60 years    Types: Cigarettes    Quit date: 01/31/2013  . Smokeless tobacco: Never Used  . Alcohol Use: No  . Drug Use: No  . Sexual Activity: Not on file   Other Topics Concern  . Not on file   Social History Narrative   Married with no children. He long-term smoker who quit prior to his valve replacement surgery earlier this year.   Up until his operation he worked part-time at [employer].  He is not yet gone back to work postoperatively.   He chose not be cardiac rehabilitation, but has gone back to walking exercising daily.     Review of Systems: General: negative for chills, fever, night sweats or weight changes.  Cardiovascular: negative for chest pain, dyspnea on exertion, edema, orthopnea, palpitations, paroxysmal nocturnal dyspnea or shortness of breath Dermatological: negative for rash Respiratory: negative for cough or wheezing Urologic: negative for hematuria Abdominal: negative for  nausea, vomiting, diarrhea, bright red blood per rectum, melena, or hematemesis Neurologic: negative for visual changes, syncope, or dizziness All other systems reviewed and are otherwise negative except as noted above.    Blood pressure 172/100, pulse 76, height 5\' 5"  (1.651 m), weight 148 lb 4.8 oz (67.268 kg).  General appearance: alert and no distress Neck: no adenopathy, no JVD, supple, symmetrical, trachea midline, thyroid not enlarged, symmetric, no tenderness/mass/nodules and right carotid bruit Lungs: clear to auscultation bilaterally Heart: regular rate and rhythm, S1, S2 normal, no murmur, click, rub or gallop Extremities: extremities normal, atraumatic, no  cyanosis or edema and 2+ femorals with bruits bilaterally. 2+ right pedal pulse, absent left pedal pulse  EKG not performed today  ASSESSMENT AND PLAN:   Peripheral vascular disease - s/p L Iliac stent The patient was referred to me by Dr. Glenetta Hew for peripheral vascular evaluation. He has a history of left common iliac artery stent in 1996 and right SFA PTA. He does have vascular disease otherwise including his coronary arteries and has had aortic valve replacement with a bioprosthetic valve last year. He complains of lifestyle limiting claudication left greater than right. His Dopplers of the year since 2006 has shown progressive weight worsening disease in his mid left SFA which became occlusive plaque in August of 2013. Recent Dopplers performed 05/29/13 revealed a right ABI 0.8 with a high-frequency signal in the right common iliac artery and a left ABI of 0.69 with an occluded left SFA. Based on this the patient's symptoms are going to proceed with angiography and potential percutaneous intervention.      [name] MD FACP,FACC,FAHA, [facility] 08/19/2013 10:11 AM

## 2013-09-15 ENCOUNTER — Telehealth: Payer: Self-pay | Admitting: Cardiovascular Disease

## 2013-09-15 NOTE — Telephone Encounter (Signed)
Returned call and pt verified x 2.  Pt informed Curt Bears is in a meeting and RN asked if he had any urgent concerns.  Pt stated he was calling b/c Dr. Gwenlyn Found wasn't able to do the angioplasty in his leg and wants to do surgery.  Stated he told him he would see him back in the office to discuss it and he wants an appt.  Pt informed he has been scheduled to see Dr. Gwenlyn Found on 4.29.15 at 9:45am.  Pt stated he wasn't aware of that appt and wrote appt down.  Pt denied other concerns at this time.

## 2013-09-15 NOTE — Telephone Encounter (Signed)
Pt wants Ralph Drake to call him,it is concerning the procedure that he did not have.

## 2013-09-30 ENCOUNTER — Ambulatory Visit (INDEPENDENT_AMBULATORY_CARE_PROVIDER_SITE_OTHER): Payer: Medicare Other | Admitting: Cardiology

## 2013-09-30 ENCOUNTER — Encounter: Payer: Self-pay | Admitting: Cardiology

## 2013-09-30 VITALS — BP 148/90 | HR 77 | Ht 65.0 in | Wt 148.8 lb

## 2013-09-30 DIAGNOSIS — I10 Essential (primary) hypertension: Secondary | ICD-10-CM

## 2013-09-30 DIAGNOSIS — I2510 Atherosclerotic heart disease of native coronary artery without angina pectoris: Secondary | ICD-10-CM

## 2013-09-30 DIAGNOSIS — Z954 Presence of other heart-valve replacement: Secondary | ICD-10-CM

## 2013-09-30 DIAGNOSIS — R0989 Other specified symptoms and signs involving the circulatory and respiratory systems: Secondary | ICD-10-CM

## 2013-09-30 DIAGNOSIS — I480 Paroxysmal atrial fibrillation: Secondary | ICD-10-CM

## 2013-09-30 DIAGNOSIS — I739 Peripheral vascular disease, unspecified: Secondary | ICD-10-CM

## 2013-09-30 DIAGNOSIS — Z9861 Coronary angioplasty status: Secondary | ICD-10-CM

## 2013-09-30 DIAGNOSIS — R0609 Other forms of dyspnea: Secondary | ICD-10-CM

## 2013-09-30 DIAGNOSIS — Z952 Presence of prosthetic heart valve: Secondary | ICD-10-CM

## 2013-09-30 DIAGNOSIS — I4891 Unspecified atrial fibrillation: Secondary | ICD-10-CM

## 2013-09-30 DIAGNOSIS — E785 Hyperlipidemia, unspecified: Secondary | ICD-10-CM

## 2013-09-30 MED ORDER — AMLODIPINE BESYLATE 10 MG PO TABS: 10.0000 mg | ORAL_TABLET | Freq: Every day | ORAL | Status: DC

## 2013-09-30 NOTE — Patient Instructions (Signed)
Increase AMOLIDIPINE 10 MG  --1 TABLET A DAY CONTINUE WITH ALL OTHER CURRENT MEDICATION.  Your physician wants you to follow-up in [place].  You will receive a reminder letter in the mail two months in advance. If you don't receive a letter, please call our office to schedule the follow-up appointment.

## 2013-10-01 ENCOUNTER — Encounter: Payer: Self-pay | Admitting: Cardiology

## 2013-10-01 DIAGNOSIS — R0609 Other forms of dyspnea: Secondary | ICD-10-CM

## 2013-10-01 NOTE — Assessment & Plan Note (Signed)
Lipids from December were noted. At that time his Zetia was brand-new. He'll be due for repeat labs in roughly June time frame.  We'll make further adjustments based on these findings.

## 2013-10-01 NOTE — Assessment & Plan Note (Signed)
He did quite well initially postoperatively. He is now back to having exertional dyspnea again. There's been no change in the murmur. I do think that some of his dyspnea may be related to his weight gain.  Plan: Followup echocardiogram in roughly September or October timeframe.

## 2013-10-01 NOTE — Assessment & Plan Note (Signed)
No recurrence of atrial fibrillation findings since his aVR. Continue beta blocker

## 2013-10-01 NOTE — Assessment & Plan Note (Addendum)
I think is probably multifactorial, dizziness and is partly related as he has no other heart failure symptoms. No angina symptoms. He does at baseline pulmonary disease and has gained significant weight.  I encouraged his continued exercise and diet modification to try to lose weight. I discussed to do with his recent smoking cessation.

## 2013-10-01 NOTE — Assessment & Plan Note (Signed)
He is due to see Dr. Gwenlyn Found back next week in followup. They're going to likely plan referral to vascular surgery for operative options. Continue cilostazol and other cardiac medications

## 2013-10-01 NOTE — Progress Notes (Signed)
PATIENT: Ralph Drake MRN: [id number]  DOB: 1934-08-04   DOV:10/01/2013 PCP: Phineas Inches, MD  Clinic Note: Chief Complaint  Patient presents with  . 4 month visit    no chest pain , sob with activity, no edema, last labs in dec 2014 due in june 2015    HPI: Ralph Drake is a 79 y.o.  male with a PMH below who presents today for followup visit for PAD & history of AVR in September of 2014. He is a former patient of Dr. Terance Ice, whom I metat the time of his preoperative cardiac catheterization.  His AVR was on 02/2013 with a Edwards Magna 43mm Bovine Pericardial valve. Followup echocardiogram in October revealed excellent seating of the valve with minimal residual gradient.  I saw him in December 2014. Since his surgery, he has also been seen by Dr. Gwenlyn Found for PAD.  He had LEA Angiography -- and will be referred to vascular surgery for possible femoropopliteal bypass on the left.. He has also successfully quit smoking.  Interval History: Today he presents relatively frustrated because he says it since his surgery, he continues to have exertional dyspnea that has not changed. He did not notice any improvement in his dyspnea. He has even quit smoking, and only noticed decreased coughing. As a result, he has not been walking as much. Of course is also complicated by the fact he has been having claudication of his left leg. He is also somewhat skeptical of why it took so long for him to be reevaluated from a peripheral vascular standpoint. He also notes he has put on about 20 pounds since his surgery and having quit smoking.  He denies any chest tightness or pressure with rest or exertion. He denies any PND, orthopnea or edema. No rapid heartbeats palpitations. No lightheadedness, dizziness, wooziness or syncope/near-syncope. No TIA or amaurosis fugax symptoms. He denies any melena, hematochezia or hematuria.   Past Medical History  Diagnosis Date  . CAD S/P percutaneous coronary  angioplasty 1993    POBA to Cx x 2 occasions; mild to moderate/nonobstructive CAD on preop cath August 2014  . Severe aortic stenosis by prior echocardiogram     per Dr. Rollene Fare 2012; referred for aortic valve replacement  . S/P AVR (aortic valve replacement) 02/10/2013    23 Edwards Magna Ease Pericardial Tissue Valve  . Peripheral vascular disease      H/O R SFA stent, L Iliac Stent 1996;;  Lower Extremity Angiography April 2015: Occluded left SFA reconstituting at end of the canal, heavily calcified. Three-vessel runoff..;; RCA 50% ostial right common iliac, patent SFA stent  . Shortness of breath     Chronic  . Hypertension   . Dyslipidemia, goal LDL below 70   . Olecranon bursitis of left elbow   . Trigger ring finger of left hand   . Osteoarthritis   . Bradycardia   . GERD (gastroesophageal reflux disease)   . Former heavy tobacco smoker      quit in October 2014    Past Cardiovascular Evaluation/Procedures   Procedure Laterality Date  . Coronary angioplasty  1993    Circumflex x 2   . Aortic valve replacement  02/10/2013    Dr. Servando Snare: [address] Ease Pericardia  . Iliac artery stent Left 1996  . Superficial femoral atery stent Right 1996  . Aortic valve replacement N/A 02/10/2013    AORTIC VALVE REPLACEMENT (AVR);  Surgeon: Grace Isaac, MD;  Location: [facility];  Service: Open Heart Surgery;  Laterality: N/A  . Intraoperative transesophageal echocardiogram N/A 02/10/2013    Procedure: INTRAOPERATIVE TRANSESOPHAGEAL ECHOCARDIOGRAM;  Surgeon: Grace Isaac, MD;  Location: [facility];  Service: Open Heart Surgery;  Laterality: N/A;  . Transthoracic echocardiogram  03/23/2013    Post-Op Echo #1: EF 50-55%, Mild Conc LVH, Mild HK of Inferolateral wall with paradoxical septal motion (c/w post-op state); Well seated 23 mm Bioprosthetic Valve (area ~1.23 cm2 - normal for valve); Mild MR  . Cardiac catheterization  August 2014    Nonobstructive 30-50% RCA. No significant LAD  disease. 30% mid circumflex.  . Peripheral vascular angiogram  01/17/1995    Right SFA 80-90# lesion, dilatation performed with a Cordis "Optz 5" 34mm x 2cm, inflated a 4-7.5atm for 40 to 70 seconds. Completion angiogram done by hand with "bolus chase" runoff from Primal SFA to foot. Resutling in reduction of 80-90% to 20% with excellent flow and without dissection  . Arterial doppler - pre-AVR   02/06/2013    Bilateral 1-39% ICA stenosis  . Cardiovascular stress test  06/24/2012    Diffuse LV hypokinesis with moderately depressed systolic function and EF 34%,  . Peripheral vascular angiogram  09/10/2013    Occluded left SFA, heavily calcified with reconstitution at adductor canal. Three-vessel runof.;; Right lower extremity noted 50% ostial right common iliac stenosis with roughly 20 mm gradient. Not considered significant.f    No Known Allergies Medications Reviewed in Epic Social and Family History reviewed in Epic  ROS: A comprehensive Review of Systems - Negative except weight gain, claudication; he also still has some mild musculoskeletal discomfort from the surgery.  PHYSICAL EXAM BP 148/90  Pulse 77  Ht 5\' 5"  (1.651 m)  Wt 148 lb 12.8 oz (67.495 kg)  BMI 24.76 kg/m2 General appearance: alert and oriented x3, cooperative, appears stated age, no distress and well-nourished/well-groomed. Answers questions appropriately. HEENT: [place]/AT, EOMI, MMM, anicteric sclera Neck: no adenopathy, no JVD, supple, trachea midline, thyroid not enlarged; faint carotid bruits bilaterally; normal carotid upstroke without delay Lungs: CTAP, normal percussion bilaterally and nonlabored, good air movement. Just mostly diminished throughout. No rales or rhonchi. Heart: normal apical impulse, regular rate and rhythm, S1: normal, S2: physiologically split, systolic murmur: early systolic 1/6, crescendo and decrescendo at 2nd right intercostal space, radiates to carotids, no click, no rub and no other  murmur. Abdomen: soft, non-tender; bowel sounds normal; no masses,  no organomegaly Extremities: extremities normal, atraumatic, no cyanosis or edema Pulses: Significantly diminished lower 70 pulses. Palpable but faint/1+ right DP and PT. Left DP PT are thready to barely palpable. Neurologic: Grossly normal  VQQ:[id number] today: Yes Rate: 77 , Rhythm: NSR. RBBB, LAFB / LAD, LVH with repolarization abnormalities. Otherwise stable EKG  Recent Labs: 05/27/2013  TC 224, TG 133, LDL 130; HDL 61.  LFTs normal. Potassium 5.0, creatinine 1.25.  ASSESSMENT / PLAN: S/P tissue AVR 02/11/13 He did quite well initially postoperatively. He is now back to having exertional dyspnea again. There's been no change in the murmur. I do think that some of his dyspnea may be related to his weight gain.  Plan: Followup echocardiogram in roughly September or October timeframe.  CAD S/P PTCA of CFX X 2 in '93 He has a many years PTCA only of the circumflex. His repeat cardiac catheterization prior to his surgery last fall demonstrated patent vessels.  No active anginal symptoms.  Plan: Continue aspirin, statin, beta blocker and ACE inhibitor. He is on cilostazol as opposed to Plavix  Exertional dyspnea I think is probably multifactorial, dizziness and is partly related as he has no other heart failure symptoms. No angina symptoms. He does at baseline pulmonary disease and has gained significant weight.  I encouraged his continued exercise and diet modification to try to lose weight. I discussed to do with his recent smoking cessation.  Claudication of left lower extremity He is due to see Dr. Gwenlyn Found back next week in followup. They're going to likely plan referral to vascular surgery for operative options. Continue cilostazol and other cardiac medications  Dyslipidemia, goal LDL below 70 Lipids from December were noted. At that time his Zetia was brand-new. He'll be due for repeat labs in roughly June time  frame.  We'll make further adjustments based on these findings.  PAF (paroxysmal atrial fibrillation), post op No recurrence of atrial fibrillation findings since his aVR. Continue beta blocker  Hypertension Blood pressure somewhat more elevated today. Increase amlodipine to 10 mg daily from 5.   He asked if he can wean off the Prilosec. I made recommendation to slowly wean from once daily to every other day to the navicular day prior to completely stopping.  Orders Placed This Encounter  Procedures  . EKG 12-Lead   Meds ordered this encounter  Medications  . metoprolol tartrate (LOPRESSOR) 25 MG tablet    Sig: Take 25 mg by mouth 2 (two) times daily.   [name] amLODipine (NORVASC) 10 MG tablet    Sig: Take 1 tablet (10 mg total) by mouth daily.    Dispense:  90 tablet    Refill:  3    Followup: Roughly 4 months  DAVID W. [name], M.D., M.S. [facility] [address]. [place], [place]  [id number]  [phone] Pager # [phone]

## 2013-10-01 NOTE — Assessment & Plan Note (Signed)
He has a many years PTCA only of the circumflex. His repeat cardiac catheterization prior to his surgery last fall demonstrated patent vessels.  No active anginal symptoms.  Plan: Continue aspirin, statin, beta blocker and ACE inhibitor. He is on cilostazol as opposed to Plavix

## 2013-10-01 NOTE — Assessment & Plan Note (Signed)
Blood pressure somewhat more elevated today. Increase amlodipine to 10 mg daily from 5.

## 2013-10-07 ENCOUNTER — Ambulatory Visit (INDEPENDENT_AMBULATORY_CARE_PROVIDER_SITE_OTHER): Payer: Medicare Other | Admitting: Cardiovascular Disease

## 2013-10-07 ENCOUNTER — Encounter: Payer: Self-pay | Admitting: Cardiovascular Disease

## 2013-10-07 VITALS — BP 123/71 | HR 57 | Ht 65.0 in | Wt 149.1 lb

## 2013-10-07 DIAGNOSIS — Z9861 Coronary angioplasty status: Secondary | ICD-10-CM

## 2013-10-07 DIAGNOSIS — I739 Peripheral vascular disease, unspecified: Secondary | ICD-10-CM

## 2013-10-07 DIAGNOSIS — I2510 Atherosclerotic heart disease of native coronary artery without angina pectoris: Secondary | ICD-10-CM

## 2013-10-07 NOTE — Assessment & Plan Note (Signed)
History of peripheral arterial disease. Recently and REM performed 09/10/13 revealed a calcified  Chronic total occlusion of the proximal mid left SFA with three-vessel runoff probably not amenable to percutaneous revascularization.. 50% ostial right common iliac artery stenosis with a 20 mm pullback gradient after nitroglycerin provocation. He does have left eye limiting claudication. I'm going to refer him to Dr. Annamarie Major  for consideration of left femoropopliteal bypass grafting.

## 2013-10-07 NOTE — Progress Notes (Signed)
10/07/2013 Ralph Drake   11/08/1934  [id number]  Primary Physician Ralph Inches, MD Primary Cardiologist: Ralph Harp MD Ralph Drake  .  HPI:  Ralph Drake is a 79 year old Caucasian male former patient of Dr. Terance Drake, currently cared for by Dr. Glenetta Drake, referred for peripheral vascular evaluation because of lifestyle limiting claudication. He history is remarkable for remote PTCA of his circumflex by myself back in 1993. He had severe aortic stenosis and had a tissue AVR 02/11/13. He's had a left common iliac artery stent in 1996 along with a right SFA PTA. His other problems include hypertension and hyperlipidemia. Since his operation he has tried to walk but has gained weight. He has left greater than right lower decortication. Doppler suggests an occluded left SFA with high-grade right common iliac artery stenosis. I performed lower extremity angiography on him 09/10/13 revealing an occluded left left SFA which was calcified and moderately wall with three-vessel runoff and a 50% ostial right common iliac artery stenosis with a 30 mm gradient and I did not think was significant enough to warrant intervention. He is partially limited by his left leg. I'm going to refer him to Dr. Annamarie Drake for consideration of left femoropopliteal bypass grafting   Current Outpatient Prescriptions  Medication Sig Dispense Refill  . amLODipine (NORVASC) 10 MG tablet Take 1 tablet (10 mg total) by mouth daily.  90 tablet  3  . aspirin EC 325 MG EC tablet Take 1 tablet (325 mg total) by mouth daily.  30 tablet  0  . atorvastatin (LIPITOR) 40 MG tablet Take 1 tablet (40 mg total) by mouth at bedtime.  90 tablet  3  . cilostazol (PLETAL) 50 MG tablet Take 1 tablet (50 mg total) by mouth 2 (two) times daily.  90 tablet  3  . ezetimibe (ZETIA) 10 MG tablet Take 1 tablet (10 mg total) by mouth daily.  90 tablet  3  . fish oil-omega-3 fatty acids 1000 MG capsule Take 1 g by  mouth daily.      [name] lisinopril (PRINIVIL,ZESTRIL) 20 MG tablet Take 1 tablet (20 mg total) by mouth at bedtime.  90 tablet  3  . metoprolol tartrate (LOPRESSOR) 25 MG tablet Take 25 mg by mouth 2 (two) times daily.       . multivitamin (THERAGRAN) per tablet Take 1 tablet by mouth daily.      [name] omeprazole (PRILOSEC) 20 MG capsule Take 1 capsule (20 mg total) by mouth daily.  90 capsule  3   No current facility-administered medications for this visit.    No Known Allergies  History   Social History  . Marital Status: Married    Spouse Name: N/A    Number of Children: N/A  . Years of Education: N/A   Occupational History  . Not on file.   Social History Main Topics  . Smoking status: Former Smoker -- 0.50 packs/day for 60 years    Types: Cigarettes    Quit date: 01/31/2013  . Smokeless tobacco: Never Used  . Alcohol Use: No  . Drug Use: No  . Sexual Activity: Not on file   Other Topics Concern  . Not on file   Social History Narrative   Married with no children. He long-term smoker who quit prior to his aVR in 2014.    Up until his operation he worked part-time at [employer].  He is not yet gone back to work postoperatively.  He chose not be cardiac rehabilitation, but has gone back to walking exercising daily.     Review of Systems: General: negative for chills, fever, night sweats or weight changes.  Cardiovascular: negative for chest pain, dyspnea on exertion, edema, orthopnea, palpitations, paroxysmal nocturnal dyspnea or shortness of breath Dermatological: negative for rash Respiratory: negative for cough or wheezing Urologic: negative for hematuria Abdominal: negative for nausea, vomiting, diarrhea, bright red blood per rectum, melena, or hematemesis Neurologic: negative for visual changes, syncope, or dizziness All other systems reviewed and are otherwise negative except as noted above.    Blood pressure 123/71, pulse 57, height 5\' 5"  (1.651 m),  weight 149 lb 1.6 oz (67.631 kg).  General appearance: alert and no distress Neck: no adenopathy, no JVD, supple, symmetrical, trachea midline, thyroid not enlarged, symmetric, no tenderness/mass/nodules and soft right carotid bruit Lungs: clear to auscultation bilaterally Heart: regular rate and rhythm, S1, S2 normal, no murmur, click, rub or gallop Extremities: extremities normal, atraumatic, no cyanosis or edema  EKG not performed today  ASSESSMENT AND PLAN:   Peripheral vascular disease - s/p L Iliac stent History of peripheral arterial disease. Recently and REM performed 09/10/13 revealed a calcified  Chronic total occlusion of the proximal mid left SFA with three-vessel runoff probably not amenable to percutaneous revascularization.. 50% ostial right common iliac artery stenosis with a 20 mm pullback gradient after nitroglycerin provocation. He does have left eye limiting claudication. I'm going to refer him to Dr. Annamarie Drake  for consideration of left femoropopliteal bypass grafting.      Ralph Harp MD FACP,FACC,FAHA, [facility] 10/07/2013 10:57 AM

## 2013-10-07 NOTE — Patient Instructions (Signed)
  We will see you back in follow up in October with Dr Gwenlyn Found.   Dr Gwenlyn Found has ordered : 1. lower extremity arterial doppler to be done in October- During this test, ultrasound is used to evaluate arterial blood flow in the legs. Allow approximately one hour for this exam.   2. Dr Gwenlyn Found has referred you to Dr Trula Slade (the vascular surgeon) to discuss bypass grafting of your left leg.

## 2013-10-13 ENCOUNTER — Telehealth: Payer: Self-pay | Admitting: *Deleted

## 2013-10-13 ENCOUNTER — Other Ambulatory Visit: Payer: Self-pay | Admitting: *Deleted

## 2013-10-13 DIAGNOSIS — Z79899 Other long term (current) drug therapy: Secondary | ICD-10-CM

## 2013-10-13 DIAGNOSIS — E782 Mixed hyperlipidemia: Secondary | ICD-10-CM

## 2013-10-13 NOTE — Telephone Encounter (Signed)
Mailed letter and lab slip CMP ,LIPIDS.

## 2013-10-13 NOTE — Telephone Encounter (Signed)
Message copied by Raiford Simmonds on Tue Oct 13, 2013  9:41 AM ------      Message from: Raiford Simmonds      Created: Fri May 29, 2013  1:18 PM       Mail lab m1y 215            cmp lipid      Lab result from 05/29/13 ------

## 2013-11-06 ENCOUNTER — Encounter: Payer: Self-pay | Admitting: Surgery

## 2013-11-09 ENCOUNTER — Encounter: Payer: Self-pay | Admitting: Surgery

## 2013-11-09 ENCOUNTER — Ambulatory Visit (INDEPENDENT_AMBULATORY_CARE_PROVIDER_SITE_OTHER): Payer: Medicare Other | Admitting: Surgery

## 2013-11-09 VITALS — BP 130/76 | HR 51 | Resp 14 | Ht 65.0 in | Wt 148.0 lb

## 2013-11-09 DIAGNOSIS — M79609 Pain in unspecified limb: Secondary | ICD-10-CM

## 2013-11-09 DIAGNOSIS — I771 Stricture of artery: Secondary | ICD-10-CM

## 2013-11-09 NOTE — Progress Notes (Signed)
Patient name: Ralph Drake MRN: [id number] DOB: 1934-09-10 Sex: male   Referred by: Dr. Gwenlyn Found  Reason for referral:  Chief Complaint  Patient presents with  . New Evaluation    Ref. by Dr. Gwenlyn Found  Chronic total occlusion of proximal mid left  SFA  w/3vesse; runoff... C/O  Bilateral  leg pain Left > Right, duration several years.    HISTORY OF PRESENT ILLNESS: The patient is referred today for lower extremity peripheral vascular disease.  The patient was found to have a left superficial femoral artery occlusion on his most recent arteriogram.  He also had right common iliac stenosis.  He states that he has burning in both of his legs, the left greater than the right.  This causes him to stop approximately 3 times within the first 1/4 mile of walking.  He does state that he is able to walk approximately 2 miles.  He denies rest pain or nonhealing ulcers.  The patient has a history of coronary disease.  He underwent angioplasty in 1993.  He most recently had an aortic valve replaced with a tissue valve in November of 2014.  His cholesterol is managed with a statin.  He is on multiple medications for hypertension.  He is taking Pletal for peripheral vascular disease.  He has a history of tobacco abuse but none currently.  Past Medical History  Diagnosis Date  . CAD S/P percutaneous coronary angioplasty 1993    POBA to Cx x 2 occasions; mild to moderate/nonobstructive CAD on preop cath August 2014  . Severe aortic stenosis by prior echocardiogram     per Dr. Rollene Fare 2012; referred for aortic valve replacement  . S/P AVR (aortic valve replacement) 02/10/2013    [address] Ease Pericardial Tissue Valve  . Peripheral vascular disease      H/O R SFA stent, L Iliac Stent 1996;;  Lower Extremity Angiography April 2015: Occluded left SFA reconstituting at end of the canal, heavily calcified. Three-vessel runoff..;; RCA 50% ostial right common iliac, patent SFA stent  . Shortness of  breath     Chronic  . Hypertension   . Dyslipidemia, goal LDL below 70   . Olecranon bursitis of left elbow   . Trigger ring finger of left hand   . Osteoarthritis   . Bradycardia   . GERD (gastroesophageal reflux disease)   . Former heavy tobacco smoker      quit in October 2014  . Atrial fibrillation     Past Surgical History  Procedure Laterality Date  . Tonsillectomy      as child  . Right hand surgery    . Trigger finger release  07/23/2011    Procedure: RELEASE TRIGGER FINGER/A-1 PULLEY;  Surgeon: Lorn Junes, MD;  Location: [place];  Service: Orthopedics;  Laterality: Left;  release trigger left ring finger patient had supraclavicular block in preop  . Olecranon bursectomy  07/23/2011    Procedure: OLECRANON BURSA;  Surgeon: Lorn Junes, MD;  Location: [place];  Service: Orthopedics;  Laterality: Left;  excision olecranon bursa left elbow patient had supraclaviular block in preop  . Coronary angioplasty  1993    Circumflex x 2   . Rotator cuff repair Right   . Aortic valve replacement  02/10/2013    Dr. Servando Snare: [address] Ease Pericardia  . Iliac artery stent Left 1996  . Superficial femoral atery stent Right 1996  . Aortic valve replacement N/A 02/10/2013  AORTIC VALVE REPLACEMENT (AVR);  Surgeon: Grace Isaac, MD;  Location: [place];  Service: Open Heart Surgery;  Laterality: N/A  . Intraoperative transesophageal echocardiogram N/A 02/10/2013    Procedure: INTRAOPERATIVE TRANSESOPHAGEAL ECHOCARDIOGRAM;  Surgeon: Grace Isaac, MD;  Location: [place];  Service: Open Heart Surgery;  Laterality: N/A;  . Transthoracic echocardiogram  03/23/2013    Post-Op Echo #1: EF 50-55%, Mild Conc LVH, Mild HK of Inferolateral wall with paradoxical septal motion (c/w post-op state); Well seated 23 mm Bioprosthetic Valve (area ~1.23 cm2 - normal for valve); Mild MR  . Cardiac catheterization  August 2014    Nonobstructive 30-50% RCA. No  significant LAD disease. 30% mid circumflex.  . Peripheral vascular angiogram  07/19/1994    No intervention-recommend medical therapy  . Peripheral vascular angiogram  01/17/1995    Right SFA 80-90# lesion, dilatation performed with a Cordis "Optz 5" 65mm x 2cm, inflated a 4-7.5atm for 40 to 70 seconds. Completion angiogram done by hand with "bolus chase" runoff from Primal SFA to foot. Resutling in reduction of 80-90% to 20% with excellent flow and without dissection  . Arterial doppler - pre-cabg  02/06/2013    Bilateral 1-39% ICA stenosis  . Cardiac catheterization  11/10/2009    No intervention - recommend medical management  . Cardiovascular stress test  06/24/2012    Diffuse LV hypokinesis with moderately depressed systolic function and EF 60%,  . Peripheral vascular angiogram  09/10/2013    Occluded left SFA, heavily calcified with reconstitution at adductor canal. Three-vessel runof.;; Right lower extremity noted 50% ostial right common iliac stenosis with roughly 20 mm gradient. Not considered significant.f  . Coronary artery bypass graft      History   Social History  . Marital Status: Married    Spouse Name: N/A    Number of Children: N/A  . Years of Education: N/A   Occupational History  . Not on file.   Social History Main Topics  . Smoking status: Former Smoker -- 0.50 packs/day for 60 years    Types: Cigarettes    Quit date: 01/31/2013  . Smokeless tobacco: Never Used  . Alcohol Use: No  . Drug Use: No  . Sexual Activity: Not on file   Other Topics Concern  . Not on file   Social History Narrative   Married with no children. He long-term smoker who quit prior to his aVR in 2014.    Up until his operation he worked part-time at [employer].  He is not yet gone back to work postoperatively.   He chose not be cardiac rehabilitation, but has gone back to walking exercising daily.    Family History  Problem Relation Age of Onset  . Hypertension Mother   .  Cancer Mother     Oral  . Stroke Father     Allergies as of 11/09/2013  . (No Known Allergies)    Current Outpatient Prescriptions on File Prior to Visit  Medication Sig Dispense Refill  . amLODipine (NORVASC) 10 MG tablet Take 1 tablet (10 mg total) by mouth daily.  90 tablet  3  . aspirin EC 325 MG EC tablet Take 1 tablet (325 mg total) by mouth daily.  30 tablet  0  . atorvastatin (LIPITOR) 40 MG tablet Take 1 tablet (40 mg total) by mouth at bedtime.  90 tablet  3  . cilostazol (PLETAL) 50 MG tablet Take 1 tablet (50 mg total) by mouth 2 (two) times daily.  90 tablet  3  . ezetimibe (ZETIA) 10 MG tablet Take 1 tablet (10 mg total) by mouth daily.  90 tablet  3  . fish oil-omega-3 fatty acids 1000 MG capsule Take 1 g by mouth daily.      [name] lisinopril (PRINIVIL,ZESTRIL) 20 MG tablet Take 1 tablet (20 mg total) by mouth at bedtime.  90 tablet  3  . metoprolol tartrate (LOPRESSOR) 25 MG tablet Take 25 mg by mouth 2 (two) times daily.       . multivitamin (THERAGRAN) per tablet Take 1 tablet by mouth daily.      [name] omeprazole (PRILOSEC) 20 MG capsule Take 1 capsule (20 mg total) by mouth daily.  90 capsule  3   No current facility-administered medications on file prior to visit.     REVIEW OF SYSTEMS: Cardiovascular: No chest pain, chest pressure, palpitations, orthopnea,No  rest pain,  No history of DVT or phlebitis.  Positive claudication and shortness of breath laser Pulmonary: No productive cough, asthma or wheezing. Neurologic: No weakness, paresthesias, aphasia, or amaurosis. No dizziness. Hematologic: No bleeding problems or clotting disorders. Musculoskeletal: No joint pain or joint swelling. Gastrointestinal: No blood in stool or hematemesis Genitourinary: No dysuria or hematuria. Psychiatric:: No history of major depression. Integumentary: No rashes or ulcers. Constitutional: No fever or chills.  PHYSICAL EXAMINATION: General: The patient appears their stated age.   Vital signs are BP 130/76  Pulse 51  Resp 14  Ht 5\' 5"  (1.651 m)  Wt 148 lb (67.132 kg)  BMI 24.63 kg/m2  SpO2 93% HEENT:  No gross abnormalities Pulmonary: Respirations are non-labored Abdomen: Soft and non-tender  Musculoskeletal: There are no major deformities.   Neurologic: No focal weakness or paresthesias are detected, Skin: There are no ulcer or rashes noted. Psychiatric: The patient has normal affect. Cardiovascular: There is a regular rate and rhythm without significant murmur appreciated.  No carotid bruits.  Pedal pulses are not palpable  Diagnostic Studies: I have reviewed his arteriogram which reveals a left superficial femoral artery occlusion with reconstitution of the above-knee popliteal artery    Assessment:  Peripheral vascular disease Plan: I discuss the angiographic findings with the patient.  We had a long conversation regarding treatment options including medical therapy vs. surgical revascularization.  After well-informed discussion, we have elected to delay surgery.  The patient would like to try an exercise program which I went over in detail with he and his wife.  He is going to follow back up with me in 3 months.  I'll get a vein mapping prior to his visit.  We will see how he is doing in 3 months and consider surgical revascularization at that time.     Eldridge Abrahams, M.D. [facility] Office: [phone] Pager:  [phone]

## 2013-11-09 NOTE — Addendum Note (Signed)
Addended by: Mena Goes on: 11/09/2013 05:06 PM   Modules accepted: Orders

## 2013-12-09 LAB — COMPREHENSIVE METABOLIC PANEL
ALT: 20 U/L (ref 0–53)
AST: 24 U/L (ref 0–37)
Albumin: 4.4 g/dL (ref 3.5–5.2)
Alkaline Phosphatase: 89 U/L (ref 39–117)
BILIRUBIN TOTAL: 0.3 mg/dL (ref 0.2–1.2)
BUN: 27 mg/dL — AB (ref 6–23)
CO2: 26 meq/L (ref 19–32)
CREATININE: 1.41 mg/dL — AB (ref 0.50–1.35)
Calcium: 10.1 mg/dL (ref 8.4–10.5)
Chloride: 106 mEq/L (ref 96–112)
GLUCOSE: 97 mg/dL (ref 70–99)
Potassium: 4.8 mEq/L (ref 3.5–5.3)
SODIUM: 139 meq/L (ref 135–145)
TOTAL PROTEIN: 6.9 g/dL (ref 6.0–8.3)

## 2013-12-09 LAB — LIPID PANEL
CHOL/HDL RATIO: 3.4 ratio
CHOLESTEROL: 171 mg/dL (ref 0–200)
HDL: 50 mg/dL (ref >39)
LDL Cholesterol: 98 mg/dL (ref 0–99)
TRIGLYCERIDES: 117 mg/dL (ref <150)
VLDL: 23 mg/dL (ref 0–40)

## 2013-12-10 ENCOUNTER — Telehealth: Payer: Self-pay | Admitting: *Deleted

## 2013-12-10 DIAGNOSIS — Z79899 Other long term (current) drug therapy: Secondary | ICD-10-CM

## 2013-12-10 DIAGNOSIS — R799 Abnormal finding of blood chemistry, unspecified: Secondary | ICD-10-CM

## 2013-12-10 DIAGNOSIS — R7989 Other specified abnormal findings of blood chemistry: Secondary | ICD-10-CM

## 2013-12-10 NOTE — Telephone Encounter (Signed)
Message copied by Raiford Simmonds on Thu Dec 10, 2013  3:08 PM ------      Message from: Leonie Man      Created: Thu Dec 10, 2013 12:25 AM       Cholesterol Level have notably improved -- TC down to 171 from 224 & LDL almost back down to what is was 1 yr ago.  HDL looks good, & Triglycerides are better.            Renal function is borderline -- need to drink plenty of water.  Should recheck labs in 2 weeks.            Leonie Man, MD       ------

## 2013-12-10 NOTE — Telephone Encounter (Signed)
Spoke to wife. Result given . Verbalized understanding She is aware patient need drink plenty of water and to repeat labs in 2 weeks.  She state patient has only one kidney.

## 2013-12-28 LAB — BASIC METABOLIC PANEL
BUN: 25 mg/dL — ABNORMAL HIGH (ref 6–23)
CHLORIDE: 106 meq/L (ref 96–112)
CO2: 24 mEq/L (ref 19–32)
Calcium: 9.8 mg/dL (ref 8.4–10.5)
Creat: 1.39 mg/dL — ABNORMAL HIGH (ref 0.50–1.35)
GLUCOSE: 97 mg/dL (ref 70–99)
POTASSIUM: 5 meq/L (ref 3.5–5.3)
SODIUM: 137 meq/L (ref 135–145)

## 2013-12-30 ENCOUNTER — Telehealth: Payer: Self-pay | Admitting: Cardiology

## 2013-12-30 NOTE — Telephone Encounter (Signed)
Called asking more questions about his lab results.  Discussed the numbers of his BUN and Cr and told him they are very close to normal. Told to make sure he stays hydrated (he does spend a lot of time outside).  Voiced understanding.

## 2013-12-30 NOTE — Telephone Encounter (Signed)
Returning your call. °

## 2014-02-08 ENCOUNTER — Encounter: Payer: Self-pay | Admitting: Cardiology

## 2014-02-08 ENCOUNTER — Ambulatory Visit (INDEPENDENT_AMBULATORY_CARE_PROVIDER_SITE_OTHER): Payer: Medicare Other | Admitting: Cardiology

## 2014-02-08 VITALS — BP 130/68 | HR 56 | Ht 65.0 in | Wt 148.1 lb

## 2014-02-08 DIAGNOSIS — I2510 Atherosclerotic heart disease of native coronary artery without angina pectoris: Secondary | ICD-10-CM

## 2014-02-08 DIAGNOSIS — I10 Essential (primary) hypertension: Secondary | ICD-10-CM

## 2014-02-08 DIAGNOSIS — E785 Hyperlipidemia, unspecified: Secondary | ICD-10-CM

## 2014-02-08 DIAGNOSIS — Z952 Presence of prosthetic heart valve: Secondary | ICD-10-CM

## 2014-02-08 DIAGNOSIS — R0609 Other forms of dyspnea: Secondary | ICD-10-CM

## 2014-02-08 DIAGNOSIS — Z954 Presence of other heart-valve replacement: Secondary | ICD-10-CM

## 2014-02-08 DIAGNOSIS — I498 Other specified cardiac arrhythmias: Secondary | ICD-10-CM

## 2014-02-08 DIAGNOSIS — R0989 Other specified symptoms and signs involving the circulatory and respiratory systems: Secondary | ICD-10-CM

## 2014-02-08 DIAGNOSIS — Z9861 Coronary angioplasty status: Secondary | ICD-10-CM

## 2014-02-08 DIAGNOSIS — Z79899 Other long term (current) drug therapy: Secondary | ICD-10-CM

## 2014-02-08 DIAGNOSIS — E782 Mixed hyperlipidemia: Secondary | ICD-10-CM

## 2014-02-08 DIAGNOSIS — R001 Bradycardia, unspecified: Secondary | ICD-10-CM

## 2014-02-08 DIAGNOSIS — I739 Peripheral vascular disease, unspecified: Secondary | ICD-10-CM

## 2014-02-08 MED ORDER — LISINOPRIL 20 MG PO TABS: 20.0000 mg | ORAL_TABLET | Freq: Every day | ORAL | Status: DC

## 2014-02-08 MED ORDER — ATORVASTATIN CALCIUM 40 MG PO TABS: 40.0000 mg | ORAL_TABLET | Freq: Every day | ORAL | Status: DC

## 2014-02-08 MED ORDER — METOPROLOL TARTRATE 25 MG PO TABS: 25.0000 mg | ORAL_TABLET | Freq: Two times a day (BID) | ORAL | Status: DC

## 2014-02-08 MED ORDER — CILOSTAZOL 50 MG PO TABS: 50.0000 mg | ORAL_TABLET | Freq: Two times a day (BID) | ORAL | Status: DC

## 2014-02-08 NOTE — Patient Instructions (Addendum)
Decrease Metoprolol tart 1/2 tablet of 25 mg  Twice a day.  Please have labs done in Jan/Feb 2016 before next appointment.  Increase Fish oil tablet 3000 mg over a 3 month period.  Increase by a 1000 mg each month.  Your physician wants you to follow-up in Jan- Feb 2016. You will receive a reminder letter in the mail two months in advance. If you don't receive a letter, please call our office to schedule the follow-up appointment.

## 2014-02-08 NOTE — Progress Notes (Signed)
PATIENT: Ralph Drake MRN: [id number]  DOB: 1934/10/03   DOV:02/12/2014 PCP: Phineas Inches, MD  Clinic Note: Chief Complaint  Patient presents with  . 4 months visit    no chest pain , no edema , no sob---sx tiredness, mood swing, unexplained weight gain - ?if Metoprolol cause the sx.    HPI: Ralph Drake is a 79 y.o.  male with a PMH below who presents today for followup visit for PAD & history of AVR in September of 2014. He is a former patient of Dr. Terance Ice.  His AVR was on 02/2013 with a Edwards Magna 31mm Bovine Pericardial valve - normal pre-op cath. . Followup echocardiogram in October revealed excellent seating of the valve with minimal residual gradient.  I saw him in December 2014 as well as April 2015. Since his surgery, he has also been seen by Dr. Gwenlyn Found & Dr. Trula Slade for PAD.  He had LEA Angiography -- and is condisering femoropopliteal bypass on the left - likely in the wintertime. He has an appointment with Dr.Brabham later this week. He has also successfully quit smoking.  Interval History:  His major concern to day is feeling tired & fatigued with decreased energy levels.  He still has some baseline exertional dyspnea, but is really more limited by hip then leg claudication. He has had someimprovement in his exertional dyspnea, but still does note this. His claudication truly limits his exercise.  As a result, he has not been walking as much.  He also notes he has put on about 20 pounds since his surgery and having quit smoking, but has been stable since December.  He denies any chest tightness or pressure with rest or exertion. He denies any PND, orthopnea or edema. No rapid heartbeats palpitations. No lightheadedness, dizziness, wooziness or syncope/near-syncope. No TIA or amaurosis fugax symptoms. He denies any melena, hematochezia or hematuria.   Past Medical History  Diagnosis Date  . CAD S/P percutaneous coronary angioplasty 1993    POBA to Cx x 2  occasions; mild to moderate/nonobstructive CAD on preop cath August 2014  . Severe aortic stenosis by prior echocardiogram     per Dr. Rollene Fare 2012; referred for aortic valve replacement  . S/P AVR (aortic valve replacement) 02/10/2013    23 Edwards Magna Ease Pericardial Tissue Valve  . Peripheral vascular disease      H/O R SFA stent, L Iliac Stent 1996;;  Lower Extremity Angiography April 2015: Occluded left SFA reconstituting at end of the canal, heavily calcified. Three-vessel runoff..;; RCA 50% ostial right common iliac, patent SFA stent -> Referred to Dr. Trula Slade [facility]  . Shortness of breath     Chronic  . Hypertension   . Dyslipidemia, goal LDL below 70   . Olecranon bursitis of left elbow   . Trigger ring finger of left hand   . Osteoarthritis   . Bradycardia   . GERD (gastroesophageal reflux disease)   . Former heavy tobacco smoker      quit in October 2014  . Atrial fibrillation     Past Cardiovascular Evaluation/Procedures    Procedure Laterality Date  . Coronary angioplasty  1993    Circumflex x 2   . Aortic valve replacement  02/10/2013    Dr. Servando Snare: [address] Ease Pericardia  . Iliac artery stent Left 1996  . Superficial femoral atery stent Right 1996  . Aortic valve replacement N/A 02/10/2013    AORTIC VALVE REPLACEMENT (AVR);  Surgeon:  Grace Isaac, MD;  Location: [facility];  Service: Open Heart Surgery;  Laterality: N/A  . Intraoperative transesophageal echocardiogram N/A 02/10/2013    Procedure: INTRAOPERATIVE TRANSESOPHAGEAL ECHOCARDIOGRAM;  Surgeon: Grace Isaac, MD;  Location: [facility];  Service: Open Heart Surgery;  Laterality: N/A;  . Transthoracic echocardiogram  03/23/2013    Post-Op Echo #1: EF 50-55%, Mild Conc LVH, Mild HK of Inferolateral wall with paradoxical septal motion (c/w post-op state); Well seated 23 mm Bioprosthetic Valve (area ~1.23 cm2 - normal for valve); Mild MR  . Cardiac catheterization  August 2014    Nonobstructive  30-50% RCA. No significant LAD disease. 30% mid circumflex.  . Peripheral vascular angiogram  07/19/1994    No intervention-recommend medical therapy  . Peripheral vascular angiogram  01/17/1995    Right SFA 80-90# lesion, dilatation performed with a Cordis "Optz 5" 84mm x 2cm, inflated a 4-7.5atm for 40 to 70 seconds. Completion angiogram done by hand with "bolus chase" runoff from Primal SFA to foot. Resutling in reduction of 80-90% to 20% with excellent flow and without dissection  . Arterial doppler - pre-cabg  02/06/2013    Bilateral 1-39% ICA stenosis  . Cardiac catheterization  11/10/2009    No intervention - recommend medical management  . Cardiovascular stress test  06/24/2012    Diffuse LV hypokinesis with moderately depressed systolic function and EF 85%,  . Peripheral vascular angiogram  09/10/2013    Occluded left SFA, heavily calcified with reconstitution at adductor canal. Three-vessel runof.;; Right lower extremity noted 50% ostial right common iliac stenosis with roughly 20 mm gradient. Not considered significant.f     No Known Allergies Medications Reviewed in [place] and Family History reviewed in Epic  ROS: A comprehensive Review of Systems -was performed Review of Systems  Constitutional: Positive for malaise/fatigue.  Respiratory: Positive for cough and shortness of breath. Negative for hemoptysis, sputum production and wheezing.   Cardiovascular: Positive for claudication.       Per HPI Claudication of the left walking maybe 3-400 yards  Gastrointestinal:       Loose, but formed stool - no Diarrhea or constipation  Neurological: Negative for dizziness, sensory change, speech change, focal weakness, seizures, loss of consciousness and headaches.  Endo/Heme/Allergies: Does not bruise/bleed easily.  All other systems reviewed and are negative.  PHYSICAL EXAM BP 130/68  Pulse 56  Ht 5\' 5"  (1.651 m)  Wt 148 lb 1.6 oz (67.178 kg)  BMI 24.65 kg/m2 General appearance:  alert and oriented x3, cooperative, appears stated age, no distress and well-nourished/well-groomed. Answers questions appropriately. HEENT: [place]/AT, EOMI, MMM, anicteric sclera Neck: no adenopathy, no JVD, supple, trachea midline, thyroid not enlarged; faint carotid bruits bilaterally; normal carotid upstroke without delay Lungs: CTAP, normal percussion bilaterally and nonlabored, good air movement. Just mostly diminished throughout. No rales or rhonchi. Heart: normal apical impulse, regular rate and rhythm, Dsitant heart sounds - S1: normal, S2: physiologically split, systolic murmur: early systolic 1/6, crescendo and decrescendo at 2nd right intercostal space, radiates to carotids, no click, no rub and no other murmur. Abdomen: soft, non-tender; bowel sounds normal; no masses,  no organomegaly Extremities: extremities normal, atraumatic, no cyanosis or edema Pulses: Significantly diminished lower 70 pulses. Palpable but faint/1+ right DP and PT. Left DP PT are thready to barely palpable. Neurologic: Grossly normal  IOE:[id number] today: No  Recent Labs: 12/2013  TC 171, TG 117, LDL 98; HDL 50.  BUN/r: 27/1.42, 25/1.39 (stable); K ~5.0  ASSESSMENT / PLAN:  CAD  S/P PTCA of CFX X 2 in '93 No active symptoms of angina or heart failure. He is having concerns for possible side effects of beta blocker and therefore would the lack of cardiac symptoms, I think we are okay to wean down to one half tablet twice a day. He remains on ACE inhibitor and statin as well as aspirin plus Plavix and Pletal. He is also on amlodipine as a potential antianginal agent.  Peripheral vascular disease - s/p L Iliac stent Still having claudication. Following with Dr.Brabham this week. Plan is for surgical revascularization sometime in the winter.  S/P tissue AVR 02/11/13 His postoperative echo looked pretty good. He does have exertional dyspnea, but the murmur is not very loud. At this point I think probably postpone  repeat echo for the anticipated three-year followup echo.  Essential hypertension Stable blood pressure now with increased dose of amlodipine. I don't think the metoprolol dose being reduced will make much of a difference  Dyslipidemia, goal LDL below 70 Last check of lipids from July showed improved control overall but not as good as expected. Continue on atorvastatin and Zetia. Increase Omega 3 FA - 3000 mg daily If he continues to be difficult to get down to goal, and we may need to consider switching from atorvastatin to Crestor Recheck lab prior to next visit  Bradycardia - can only tolerate low dose BB, continues to need temp pacer He did have significant bradycardia in the hospital and is unable to really get titrated up beta blocker much. On 5 backing off to one half tablet twice a day. If he feels particularly tired or weak, he can hold a dose  Exertional dyspnea His echo really did not show anything to suggest a cardiac etiology in the past, and he's not having any anginal symptoms. Certainly he could have diastolic dysfunction, more likely it is related to his underlying pulmonary disease and deconditioning as well as weight gain.   He asked if he can wean off the Prilosec. I made recommendation to slowly wean from once daily to every other day to prior to completely stopping.  Orders Placed This Encounter  Procedures  . Lipid panel    Standing Status: Future     Number of Occurrences:      Standing Expiration Date: 02/09/2015    Order Specific Question:  Has the patient fasted?    Answer:  Yes  . Comprehensive metabolic panel    Standing Status: Future     Number of Occurrences:      Standing Expiration Date: 02/09/2015    Order Specific Question:  Has the patient fasted?    Answer:  Yes   Meds ordered this encounter  Medications    Sig: Take 25 mg by mouth 2 (two) times daily.   [name] amLODipine (NORVASC) 10 MG tablet    Sig: Take 1 tablet (10 mg total) by mouth daily.      Dispense:  90 tablet    Refill:  3    Recheck Lipids in Jan - F/u Jan-Feb  Followup: Roughly 5-6 months Annelise Mccoy W, M.D., M.S. Interventional Cardiologist   Pager # [phone] 02/12/2014

## 2014-02-11 ENCOUNTER — Encounter: Payer: Self-pay | Admitting: Surgery

## 2014-02-12 ENCOUNTER — Ambulatory Visit (INDEPENDENT_AMBULATORY_CARE_PROVIDER_SITE_OTHER): Payer: Medicare Other | Admitting: Surgery

## 2014-02-12 ENCOUNTER — Ambulatory Visit (HOSPITAL_COMMUNITY)
Admission: RE | Admit: 2014-02-12 | Discharge: 2014-02-12 | Disposition: A | Discharge status: Home / Self Care | Payer: Medicare Other | Source: Ambulatory Visit | Attending: Surgery | Admitting: Surgery

## 2014-02-12 ENCOUNTER — Encounter: Payer: Self-pay | Admitting: Surgery

## 2014-02-12 VITALS — BP 139/71 | HR 57 | Ht 65.0 in | Wt 150.0 lb

## 2014-02-12 DIAGNOSIS — Z9861 Coronary angioplasty status: Secondary | ICD-10-CM

## 2014-02-12 DIAGNOSIS — I771 Stricture of artery: Secondary | ICD-10-CM

## 2014-02-12 DIAGNOSIS — M79609 Pain in unspecified limb: Secondary | ICD-10-CM | POA: Diagnosis not present

## 2014-02-12 DIAGNOSIS — I2510 Atherosclerotic heart disease of native coronary artery without angina pectoris: Secondary | ICD-10-CM

## 2014-02-12 NOTE — Assessment & Plan Note (Addendum)
No active symptoms of angina or heart failure. He is having concerns for possible side effects of beta blocker and therefore would the lack of cardiac symptoms, I think we are okay to wean down to one half tablet twice a day. He remains on ACE inhibitor and statin as well as aspirin plus Plavix and Pletal. He is also on amlodipine as a potential antianginal agent.

## 2014-02-12 NOTE — Assessment & Plan Note (Signed)
Stable blood pressure now with increased dose of amlodipine. I don't think the metoprolol dose being reduced will make much of a difference

## 2014-02-12 NOTE — Assessment & Plan Note (Signed)
His echo really did not show anything to suggest a cardiac etiology in the past, and he's not having any anginal symptoms. Certainly he could have diastolic dysfunction, more likely it is related to his underlying pulmonary disease and deconditioning as well as weight gain.

## 2014-02-12 NOTE — Assessment & Plan Note (Signed)
Still having claudication. Following with Dr.Brabham this week. Plan is for surgical revascularization sometime in the winter.

## 2014-02-12 NOTE — Assessment & Plan Note (Signed)
He did have significant bradycardia in the hospital and is unable to really get titrated up beta blocker much. On 5 backing off to one half tablet twice a day. If he feels particularly tired or weak, he can hold a dose

## 2014-02-12 NOTE — Progress Notes (Signed)
Patient name: Ralph Drake MRN: [id number] DOB: Jul 13, 1934 Sex: male     Chief Complaint  Patient presents with  . Re-evaluation    3 month f/u     HISTORY OF PRESENT ILLNESS: The patient comes back today for followup of his left leg claudication.  He reports no significant improvements with exercise program.  He still gets claudication symptoms at approximately 3-400 yards.  His left leg bothers him the most.  He denies rest pain or ulceration.  Past Medical History  Diagnosis Date  . CAD S/P percutaneous coronary angioplasty 1993    POBA to Cx x 2 occasions; mild to moderate/nonobstructive CAD on preop cath August 2014  . Severe aortic stenosis by prior echocardiogram     per Dr. Rollene Fare 2012; referred for aortic valve replacement  . S/P AVR (aortic valve replacement) 02/10/2013    23 Edwards Magna Ease Pericardial Tissue Valve  . Peripheral vascular disease      H/O R SFA stent, L Iliac Stent 1996;;  Lower Extremity Angiography April 2015: Occluded left SFA reconstituting at end of the canal, heavily calcified. Three-vessel runoff..;; RCA 50% ostial right common iliac, patent SFA stent -> Referred to Dr. Trula Slade [facility]  . Shortness of breath     Chronic  . Hypertension   . Dyslipidemia, goal LDL below 70   . Olecranon bursitis of left elbow   . Trigger ring finger of left hand   . Osteoarthritis   . Bradycardia   . GERD (gastroesophageal reflux disease)   . Former heavy tobacco smoker      quit in October 2014  . Atrial fibrillation     Past Surgical History  Procedure Laterality Date  . Tonsillectomy      as child  . Right hand surgery    . Trigger finger release  07/23/2011    Procedure: RELEASE TRIGGER FINGER/A-1 PULLEY;  Surgeon: Lorn Junes, MD;  Location: [facility];  Service: Orthopedics;  Laterality: Left;  release trigger left ring finger patient had supraclavicular block in preop  . Olecranon bursectomy  07/23/2011    Procedure:  OLECRANON BURSA;  Surgeon: Lorn Junes, MD;  Location: [facility];  Service: Orthopedics;  Laterality: Left;  excision olecranon bursa left elbow patient had supraclaviular block in preop  . Coronary angioplasty  1993    Circumflex x 2   . Rotator cuff repair Right   . Aortic valve replacement  02/10/2013    Dr. Servando Snare: [id number] Ease Pericardia  . Iliac artery stent Left 1996  . Superficial femoral atery stent Right 1996  . Aortic valve replacement N/A 02/10/2013    AORTIC VALVE REPLACEMENT (AVR);  Surgeon: Grace Isaac, MD;  Location: [facility];  Service: Open Heart Surgery;  Laterality: N/A  . Intraoperative transesophageal echocardiogram N/A 02/10/2013    Procedure: INTRAOPERATIVE TRANSESOPHAGEAL ECHOCARDIOGRAM;  Surgeon: Grace Isaac, MD;  Location: [facility];  Service: Open Heart Surgery;  Laterality: N/A;  . Transthoracic echocardiogram  03/23/2013    Post-Op Echo #1: EF 50-55%, Mild Conc LVH, Mild HK of Inferolateral wall with paradoxical septal motion (c/w post-op state); Well seated 23 mm Bioprosthetic Valve (area ~1.23 cm2 - normal for valve); Mild MR  . Cardiac catheterization  August 2014    Nonobstructive 30-50% RCA. No significant LAD disease. 30% mid circumflex.  . Peripheral vascular angiogram  07/19/1994    No intervention-recommend medical therapy  . Peripheral vascular  angiogram  01/17/1995    Right SFA 80-90# lesion, dilatation performed with a Cordis "Optz 5" 59mm x 2cm, inflated a 4-7.5atm for 40 to 70 seconds. Completion angiogram done by hand with "bolus chase" runoff from Primal SFA to foot. Resutling in reduction of 80-90% to 20% with excellent flow and without dissection  . Arterial doppler - pre-cabg  02/06/2013    Bilateral 1-39% ICA stenosis  . Cardiac catheterization  11/10/2009    No intervention - recommend medical management  . Cardiovascular stress test  06/24/2012    Diffuse LV hypokinesis with moderately depressed systolic function and EF  38%,  . Peripheral vascular angiogram  09/10/2013    Occluded left SFA, heavily calcified with reconstitution at adductor canal. Three-vessel runof.;; Right lower extremity noted 50% ostial right common iliac stenosis with roughly 20 mm gradient. Not considered significant.f    History   Social History  . Marital Status: Married    Spouse Name: N/A    Number of Children: N/A  . Years of Education: N/A   Occupational History  . Not on file.   Social History Main Topics  . Smoking status: Former Smoker -- 0.50 packs/day for 60 years    Types: Cigarettes    Quit date: 01/31/2013  . Smokeless tobacco: Never Used  . Alcohol Use: No  . Drug Use: No  . Sexual Activity: Not on file   Other Topics Concern  . Not on file   Social History Narrative   Married with no children. He long-term smoker who quit prior to his aVR in 2014.    Up until his operation he worked part-time at [employer].  He is not yet gone back to work postoperatively.   He chose not be cardiac rehabilitation, but has gone back to walking exercising daily.    Family History  Problem Relation Age of Onset  . Hypertension Mother   . Cancer Mother     Oral  . Stroke Father     Allergies as of 02/12/2014  . (No Known Allergies)    Current Outpatient Prescriptions on File Prior to Visit  Medication Sig Dispense Refill  . amLODipine (NORVASC) 10 MG tablet Take 1 tablet (10 mg total) by mouth daily.  90 tablet  3  . aspirin EC 325 MG EC tablet Take 1 tablet (325 mg total) by mouth daily.  30 tablet  0  . atorvastatin (LIPITOR) 40 MG tablet Take 1 tablet (40 mg total) by mouth at bedtime.  90 tablet  3  . cilostazol (PLETAL) 50 MG tablet Take 1 tablet (50 mg total) by mouth 2 (two) times daily.  180 tablet  3  . ezetimibe (ZETIA) 10 MG tablet Take 1 tablet (10 mg total) by mouth daily.  90 tablet  3  . fish oil-omega-3 fatty acids 1000 MG capsule Take 1 g by mouth 2 (two) times daily.       [name]  lisinopril (PRINIVIL,ZESTRIL) 20 MG tablet Take 1 tablet (20 mg total) by mouth at bedtime.  90 tablet  3  . multivitamin (THERAGRAN) per tablet Take 1 tablet by mouth daily.      [name] omeprazole (PRILOSEC) 20 MG capsule Take 20 mg by mouth daily as needed.       No current facility-administered medications on file prior to visit.     REVIEW OF SYSTEMS: No change from visit 3 months ago  PHYSICAL EXAMINATION:   Vital signs are BP 139/71  Pulse 57  Ht 5\' 5"  (1.651 m)  Wt 150 lb (68.04 kg)  BMI 24.96 kg/m2  SpO2 97% General: The patient appears their stated age. HEENT:  No gross abnormalities Pulmonary:  Non labored breathing Musculoskeletal: There are no major deformities. Neurologic: No focal weakness or paresthesias are detected, Skin: There are no ulcer or rashes noted. Psychiatric: The patient has normal affect. Cardiovascular: There is a regular rate and rhythm without significant murmur appreciated.   Diagnostic Studies Vein mapping was ordered for today.  He has an adequate saphenous vein from the groin down to the knee.  Assessment: Peripheral vascular disease with claudication, left greater than right Plan: I discussed the details of surgical revascularization.  The patient would like to delay until the winter months as he has several yards to maintain and leaves to deal with and does not want to be recovering from surgery during this time.  Therefore, I have scheduled him to followup with me in January.  He'll continue his Pletal.  Eldridge Abrahams, M.D. [facility] [facility] Office: [phone] Pager:  [phone]

## 2014-02-12 NOTE — Assessment & Plan Note (Addendum)
Last check of lipids from July showed improved control overall but not as good as expected. Continue on atorvastatin and Zetia. Increase Omega 3 FA - 3000 mg daily If he continues to be difficult to get down to goal, and we may need to consider switching from atorvastatin to Crestor Recheck lab prior to next visit

## 2014-02-12 NOTE — Assessment & Plan Note (Signed)
His postoperative echo looked pretty good. He does have exertional dyspnea, but the murmur is not very loud. At this point I think probably postpone repeat echo for the anticipated three-year followup echo.

## 2014-02-22 ENCOUNTER — Encounter (HOSPITAL_COMMUNITY): Payer: Medicare Other

## 2014-02-22 ENCOUNTER — Ambulatory Visit: Payer: Medicare Other | Admitting: Surgery

## 2014-05-20 ENCOUNTER — Encounter (HOSPITAL_COMMUNITY): Payer: Self-pay | Admitting: Cardiology

## 2014-06-14 ENCOUNTER — Ambulatory Visit: Payer: Medicare Other | Admitting: Surgery

## 2014-06-16 ENCOUNTER — Telehealth: Payer: Self-pay | Admitting: *Deleted

## 2014-06-16 DIAGNOSIS — Z79899 Other long term (current) drug therapy: Secondary | ICD-10-CM

## 2014-06-16 DIAGNOSIS — E782 Mixed hyperlipidemia: Secondary | ICD-10-CM

## 2014-06-16 NOTE — Telephone Encounter (Signed)
MAILED  IN LETTER  AND LABSLIP- cmp ,lipid

## 2014-06-16 NOTE — Telephone Encounter (Signed)
-----   Message from Raiford Simmonds, RN sent at 02/08/2014  8:42 AM EDT ----- Labs in [place] or feb 2016 before next appointment

## 2014-06-25 ENCOUNTER — Encounter: Payer: Self-pay | Admitting: Surgery

## 2014-06-28 ENCOUNTER — Ambulatory Visit (INDEPENDENT_AMBULATORY_CARE_PROVIDER_SITE_OTHER): Payer: Medicare Other | Admitting: Surgery

## 2014-06-28 ENCOUNTER — Encounter: Payer: Self-pay | Admitting: Surgery

## 2014-06-28 VITALS — BP 172/83 | HR 58 | Ht 65.0 in | Wt 152.6 lb

## 2014-06-28 DIAGNOSIS — I70219 Atherosclerosis of native arteries of extremities with intermittent claudication, unspecified extremity: Secondary | ICD-10-CM

## 2014-06-28 NOTE — Progress Notes (Signed)
Patient name: Ralph Drake MRN: [id number] DOB: 1934-10-22 Sex: male     Chief Complaint  Patient presents with  . Re-evaluation    f/u in Jan. surgical revascularization    HISTORY OF PRESENT ILLNESS: The patient is back today for follow-up of his left leg claudication.  He underwent angiography by Dr. Gwenlyn Found which revealed a flush occlusion of his left superficial femoral artery with reconstitution of the above-knee popliteal artery.  He has had vein mapping which revealed an adequate vein.  The patient did not want surgery initially because of work that he needed to get done.  He is back today for further evaluation.  He did have a skin cancer removed from his left leg back in November which has been slow to heal.  Past Medical History  Diagnosis Date  . CAD S/P percutaneous coronary angioplasty 1993    POBA to Cx x 2 occasions; mild to moderate/nonobstructive CAD on preop cath August 2014  . Severe aortic stenosis by prior echocardiogram     per Dr. Rollene Fare 2012; referred for aortic valve replacement  . S/P AVR (aortic valve replacement) 02/10/2013    23 Edwards Magna Ease Pericardial Tissue Valve  . Peripheral vascular disease      H/O R SFA stent, L Iliac Stent 1996;;  Lower Extremity Angiography April 2015: Occluded left SFA reconstituting at end of the canal, heavily calcified. Three-vessel runoff..;; RCA 50% ostial right common iliac, patent SFA stent -> Referred to Dr. Trula Slade [facility]  . Shortness of breath     Chronic  . Hypertension   . Dyslipidemia, goal LDL below 70   . Olecranon bursitis of left elbow   . Trigger ring finger of left hand   . Osteoarthritis   . Bradycardia   . GERD (gastroesophageal reflux disease)   . Former heavy tobacco smoker      quit in October 2014  . Atrial fibrillation     Past Surgical History  Procedure Laterality Date  . Tonsillectomy      as child  . Right hand surgery    . Trigger finger release  07/23/2011   Procedure: RELEASE TRIGGER FINGER/A-1 PULLEY;  Surgeon: Lorn Junes, MD;  Location: [place];  Service: Orthopedics;  Laterality: Left;  release trigger left ring finger patient had supraclavicular block in preop  . Olecranon bursectomy  07/23/2011    Procedure: OLECRANON BURSA;  Surgeon: Lorn Junes, MD;  Location: [place];  Service: Orthopedics;  Laterality: Left;  excision olecranon bursa left elbow patient had supraclaviular block in preop  . Coronary angioplasty  1993    Circumflex x 2   . Rotator cuff repair Right   . Aortic valve replacement  02/10/2013    Dr. Servando Snare: [address] Ease Pericardia  . Iliac artery stent Left 1996  . Superficial femoral atery stent Right 1996  . Aortic valve replacement N/A 02/10/2013    AORTIC VALVE REPLACEMENT (AVR);  Surgeon: Grace Isaac, MD;  Location: [place];  Service: Open Heart Surgery;  Laterality: N/A  . Intraoperative transesophageal echocardiogram N/A 02/10/2013    Procedure: INTRAOPERATIVE TRANSESOPHAGEAL ECHOCARDIOGRAM;  Surgeon: Grace Isaac, MD;  Location: [place];  Service: Open Heart Surgery;  Laterality: N/A;  . Transthoracic echocardiogram  03/23/2013    Post-Op Echo #1: EF 50-55%, Mild Conc LVH, Mild HK of Inferolateral wall with paradoxical septal motion (c/w post-op state); Well seated 23 mm Bioprosthetic Valve (  area ~1.23 cm2 - normal for valve); Mild MR  . Cardiac catheterization  August 2014    Nonobstructive 30-50% RCA. No significant LAD disease. 30% mid circumflex.  . Peripheral vascular angiogram  07/19/1994    No intervention-recommend medical therapy  . Peripheral vascular angiogram  01/17/1995    Right SFA 80-90# lesion, dilatation performed with a Cordis "Optz 5" 85mm x 2cm, inflated a 4-7.5atm for 40 to 70 seconds. Completion angiogram done by hand with "bolus chase" runoff from Primal SFA to foot. Resutling in reduction of 80-90% to 20% with excellent flow and without dissection    . Arterial doppler - pre-cabg  02/06/2013    Bilateral 1-39% ICA stenosis  . Cardiac catheterization  11/10/2009    No intervention - recommend medical management  . Cardiovascular stress test  06/24/2012    Diffuse LV hypokinesis with moderately depressed systolic function and EF 28%,  . Peripheral vascular angiogram  09/10/2013    Occluded left SFA, heavily calcified with reconstitution at adductor canal. Three-vessel runof.;; Right lower extremity noted 50% ostial right common iliac stenosis with roughly 20 mm gradient. Not considered significant.f  . Left and right heart catheterization with coronary angiogram N/A 01/28/2013    Procedure: LEFT AND RIGHT HEART CATHETERIZATION WITH CORONARY ANGIOGRAM;  Surgeon: Leonie Man, MD;  Location: [facility] CATH LAB;  Service: Cardiovascular;  Laterality: N/A;  . Lower extremity angiogram N/A 09/10/2013    Procedure: LOWER EXTREMITY ANGIOGRAM;  Surgeon: [name], MD;  Location: [facility] CATH LAB;  Service: Cardiovascular;  Laterality: N/A;  . Abdominal angiogram  09/10/2013    Procedure: ABDOMINAL ANGIOGRAM;  Surgeon: [name], MD;  Location: [facility] CATH LAB;  Service: Cardiovascular;;    History   Social History  . Marital Status: Married    Spouse Name: N/A    Number of Children: N/A  . Years of Education: N/A   Occupational History  . Not on file.   Social History Main Topics  . Smoking status: Former Smoker -- 0.50 packs/day for 60 years    Types: Cigarettes    Quit date: 01/31/2013  . Smokeless tobacco: Never Used  . Alcohol Use: No  . Drug Use: No  . Sexual Activity: Not on file   Other Topics Concern  . Not on file   Social History Narrative   Married with no children. He long-term smoker who quit prior to his aVR in 2014.    Up until his operation he worked part-time at [employer].  He is not yet gone back to work postoperatively.   He chose not be cardiac rehabilitation, but has gone back to walking exercising daily.     Family History  Problem Relation Age of Onset  . Hypertension Mother   . Cancer Mother     Oral  . Stroke Father     Allergies as of 06/28/2014  . (No Known Allergies)    Current Outpatient Prescriptions on File Prior to Visit  Medication Sig Dispense Refill  . amLODipine (NORVASC) 10 MG tablet Take 1 tablet (10 mg total) by mouth daily. 90 tablet 3  . aspirin EC 325 MG EC tablet Take 1 tablet (325 mg total) by mouth daily. 30 tablet 0  . atorvastatin (LIPITOR) 40 MG tablet Take 1 tablet (40 mg total) by mouth at bedtime. 90 tablet 3  . cilostazol (PLETAL) 50 MG tablet Take 1 tablet (50 mg total) by mouth 2 (two) times daily. 180 tablet 3  .  ezetimibe (ZETIA) 10 MG tablet Take 1 tablet (10 mg total) by mouth daily. 90 tablet 3  . fish oil-omega-3 fatty acids 1000 MG capsule Take 1 g by mouth 2 (two) times daily.     [name] lisinopril (PRINIVIL,ZESTRIL) 20 MG tablet Take 1 tablet (20 mg total) by mouth at bedtime. 90 tablet 3  . metoprolol tartrate (LOPRESSOR) 25 MG tablet Take 12.5 mg by mouth 2 (two) times daily.    . multivitamin (THERAGRAN) per tablet Take 1 tablet by mouth daily.    [name] omeprazole (PRILOSEC) 20 MG capsule Take 20 mg by mouth daily as needed.     No current facility-administered medications on file prior to visit.     REVIEW OF SYSTEMS: Cardiovascular: No chest pain, chest pressure, palpitations, orthopnea,  positive for claudication.  Negative for rest pain,  No history of DVT or phlebitis. Pulmonary: No productive cough, asthma or wheezing.  Positive for shortness of breath with exertion Neurologic: No weakness, paresthesias, aphasia, or amaurosis. No dizziness. Hematologic: No bleeding problems or clotting disorders. Musculoskeletal: No joint pain or joint swelling. Gastrointestinal: No blood in stool or hematemesis Genitourinary: No dysuria or hematuria. Psychiatric:: No history of major depression. Integumentary: No rashes or ulcers. Constitutional: No  fever or chills.  PHYSICAL EXAMINATION:   Vital signs are BP 172/83 mmHg  Pulse 58  Ht 5\' 5"  (1.651 m)  Wt 152 lb 9.6 oz (69.219 kg)  BMI 25.39 kg/m2  SpO2 98% General: The patient appears their stated age. HEENT:  No gross abnormalities Pulmonary:  Non labored breathing Musculoskeletal: There are no major deformities. Neurologic: No focal weakness or paresthesias are detected, Skin: Scab anterior to skin cancer removal site on left leg without erythema or drainage. Psychiatric: The patient has normal affect. Cardiovascular: There is a regular rate and rhythm without significant murmur appreciated.   Diagnostic Studies None  Assessment: Left leg claudication Plan: I discussed with the patient our options which include medical therapy versus a left femoral-popliteal bypass graft with vein.  He is not yet ready to commit towards surgery.  He will contact me if he wants to proceed with surgery.  His arteriogram was on 09/10/2013.  Depending on how long he delays, I will consider repeating the angiogram.  He will contact me on an as-needed basis.  I did discuss that if he develops a wound or an ulcer that he needs to contact me immediately  V. Leia Alf, M.D. [facility] Office: [phone] Pager:  [phone]

## 2014-07-21 LAB — LIPID PANEL
Cholesterol: 169 mg/dL (ref 0–200)
HDL: 43 mg/dL (ref >39)
LDL CALC: 96 mg/dL (ref 0–99)
TRIGLYCERIDES: 151 mg/dL — AB (ref <150)
Total CHOL/HDL Ratio: 3.9 Ratio
VLDL: 30 mg/dL (ref 0–40)

## 2014-07-21 LAB — COMPREHENSIVE METABOLIC PANEL
ALBUMIN: 4.3 g/dL (ref 3.5–5.2)
ALT: 18 U/L (ref 0–53)
AST: 23 U/L (ref 0–37)
Alkaline Phosphatase: 65 U/L (ref 39–117)
BUN: 23 mg/dL (ref 6–23)
CALCIUM: 10.3 mg/dL (ref 8.4–10.5)
CO2: 26 mEq/L (ref 19–32)
Chloride: 104 mEq/L (ref 96–112)
Creat: 1.28 mg/dL (ref 0.50–1.35)
Glucose, Bld: 90 mg/dL (ref 70–99)
Potassium: 5 mEq/L (ref 3.5–5.3)
SODIUM: 137 meq/L (ref 135–145)
TOTAL PROTEIN: 6.6 g/dL (ref 6.0–8.3)
Total Bilirubin: 0.4 mg/dL (ref 0.2–1.2)

## 2014-07-23 ENCOUNTER — Telehealth: Payer: Self-pay | Admitting: *Deleted

## 2014-07-23 NOTE — Telephone Encounter (Signed)
Spoke to patient. Result given . Verbalized understanding  

## 2014-07-23 NOTE — Telephone Encounter (Signed)
-----   Message from Leonie Man, MD sent at 07/23/2014  5:28 AM EST ----- Chemistry panel & Lipid panel are essentially stable from 76 months ago.  [place]

## 2014-07-28 ENCOUNTER — Encounter: Payer: Self-pay | Admitting: Internal Medicine

## 2014-08-10 ENCOUNTER — Ambulatory Visit (INDEPENDENT_AMBULATORY_CARE_PROVIDER_SITE_OTHER): Payer: Medicare Other | Admitting: Cardiology

## 2014-08-10 ENCOUNTER — Encounter: Payer: Self-pay | Admitting: Cardiology

## 2014-08-10 VITALS — BP 118/66 | HR 53 | Ht 65.0 in | Wt 151.8 lb

## 2014-08-10 DIAGNOSIS — I10 Essential (primary) hypertension: Secondary | ICD-10-CM

## 2014-08-10 DIAGNOSIS — R0609 Other forms of dyspnea: Secondary | ICD-10-CM

## 2014-08-10 DIAGNOSIS — Z952 Presence of prosthetic heart valve: Secondary | ICD-10-CM

## 2014-08-10 DIAGNOSIS — I739 Peripheral vascular disease, unspecified: Secondary | ICD-10-CM

## 2014-08-10 DIAGNOSIS — R0989 Other specified symptoms and signs involving the circulatory and respiratory systems: Secondary | ICD-10-CM

## 2014-08-10 DIAGNOSIS — I2510 Atherosclerotic heart disease of native coronary artery without angina pectoris: Secondary | ICD-10-CM

## 2014-08-10 DIAGNOSIS — I70219 Atherosclerosis of native arteries of extremities with intermittent claudication, unspecified extremity: Secondary | ICD-10-CM

## 2014-08-10 DIAGNOSIS — Z9861 Coronary angioplasty status: Secondary | ICD-10-CM

## 2014-08-10 DIAGNOSIS — E785 Hyperlipidemia, unspecified: Secondary | ICD-10-CM

## 2014-08-10 DIAGNOSIS — Z954 Presence of other heart-valve replacement: Secondary | ICD-10-CM

## 2014-08-10 DIAGNOSIS — I480 Paroxysmal atrial fibrillation: Secondary | ICD-10-CM

## 2014-08-10 MED ORDER — EZETIMIBE 10 MG PO TABS: 10.0000 mg | ORAL_TABLET | Freq: Every day | ORAL | Status: DC

## 2014-08-10 MED ORDER — ATORVASTATIN CALCIUM 40 MG PO TABS: 40.0000 mg | ORAL_TABLET | Freq: Every day | ORAL | Status: DC

## 2014-08-10 MED ORDER — AMLODIPINE BESYLATE 10 MG PO TABS
10.0000 mg | ORAL_TABLET | Freq: Every day | ORAL | Status: DC
Start: 2014-08-10 — End: 2015-08-12

## 2014-08-10 MED ORDER — ROSUVASTATIN CALCIUM 40 MG PO TABS: 40.0000 mg | ORAL_TABLET | Freq: Every day | ORAL | Status: DC

## 2014-08-10 NOTE — Patient Instructions (Addendum)
Medication refilled - prescription were given to you   Change to crestor from atorvastatin  Your physician has requested that you have an echocardiogram. Echocardiography is a painless test that uses sound waves to create images of your heart. It provides your doctor with information about the size and shape of your heart and how well your heart's chambers and valves are working. This procedure takes approximately one hour. There are no restrictions for this procedure.  Your physician has requested that you have a carotid duplex. This test is an ultrasound of the carotid arteries in your neck. It looks at blood flow through these arteries that supply the brain with blood. Allow one hour for this exam. There are no restrictions or special instructions.   Your physician wants you to follow-up in 6 month Dr [name].  You will receive a reminder letter in the mail two months in advance. If you don't receive a letter, please call our office to schedule the follow-up appointment.

## 2014-08-10 NOTE — Progress Notes (Signed)
PATIENT: Ralph Drake MRN: [id number]  DOB: 1935/05/28   DOV:08/12/2014 PCP: Phineas Inches, MD  Clinic Note: Chief Complaint  Patient presents with  . Follow-up    6 months follow-up, pt denied SOB and chest pain  . Coronary Artery Disease    s/p PTCA, s/p AVR  . PAD    HPI: Ralph Drake is a 80 y.o.  male with a PMH below who presents today for followup visit for CAD, PAD & history of AVR in September of 2014. He is a former patient of Dr. Terance Ice.  His AVR was on 02/2013 with a Edwards Magna 83mm Bovine Pericardial valve - normal pre-op cath. . Followup echocardiogram in October 2014 revealed excellent seating of the valve with minimal residual gradient.  I last saw him in Sept 2015 and he was feeling fatigued and tired. That seems to have improved. He has also successfully quit smoking.  He has been seeing Dr. Trula Slade from [facility] for his occlusive Iliofemoral disease (L SFA occlusion - no amenable to PTA & ~50% Ostial R CIA with 20 mmHg gradient.  He has chosen to pursue medical /expectant management of his PAD & has not had L Fem-pop bypass.  He does significant left-sided exertional claudication. He'll walk about a mile to mile and a half and want to stop me 4 times for angina. He is not had any nonhealing ulcers. He does have some claudication on the right but not nearly as much the left.[place] about 3-400 yards without stopping.  He had a skin cancer lesion removed from his left leg in November 2015 took a while to heal.  As of his January visit, he was not yet ready to commit to surgical revascularization.  Interval History:  He seems to think that his energy levels have improved of late. His blood pressure has been much improved. He has not had any anginal symptoms with rest or exertion. He still has exertional dyspnea if he really tries to exert himself. He still has some baseline exertional dyspnea, but is really more limited by hip then leg claudication.  As a result, he  has not been walking as much -- as the weather has improved, he is really trying to get back out to do at least a mile to 1/2 miles at a time. He is hoping that will prove collateral flow and that he may not need to undergo surgery. I think he is leery of surgery at this time.  His weight has now remained stable without significant increases that we saw last year.  He denies any chest tightness or pressure with rest or exertion. He denies any PND, orthopnea or edema. No rapid heartbeats palpitations. No lightheadedness, dizziness, wooziness or syncope/near-syncope. No TIA or amaurosis fugax symptoms. He denies any melena, hematochezia or hematuria.   Past Medical History  Diagnosis Date  . CAD S/P percutaneous coronary angioplasty 1993    POBA to Cx x 2 occasions; mild to moderate/nonobstructive CAD on preop cath August 2014  . Severe aortic stenosis by prior echocardiogram     per Dr. Rollene Fare 2012; referred for aortic valve replacement  . S/P AVR (aortic valve replacement) 02/10/2013    23 Edwards Magna Ease Pericardial Tissue Valve  . Peripheral vascular disease      H/O R SFA stent, L Iliac Stent 1996;;  Lower Extremity Angiography April 2015: Occluded left SFA reconstituting at end of the canal, heavily calcified. Three-vessel runoff..;; RCA 50% ostial right common iliac,  patent SFA stent -> Referred to Dr. Trula Slade [facility].  . Shortness of breath     Chronic  . Hypertension   . Dyslipidemia, goal LDL below 70   . Olecranon bursitis of left elbow   . Trigger ring finger of left hand   . Osteoarthritis   . Bradycardia   . GERD (gastroesophageal reflux disease)   . Former heavy tobacco smoker      quit in October 2014  . Atrial fibrillation     Past Cardiovascular Evaluation/Procedures    Procedure Laterality Date  . Coronary angioplasty  1993    Circumflex x 2   . Aortic valve replacement  02/10/2013    Dr. Servando Snare: [id number] Ease Pericardia  . Iliac artery stent Left  1996  . Superficial femoral atery stent Right 1996  . Aortic valve replacement N/A 02/10/2013    AORTIC VALVE REPLACEMENT (AVR);  Surgeon: Grace Isaac, MD;  Location: [place];  Service: Open Heart Surgery;  Laterality: N/A  . Intraoperative transesophageal echocardiogram N/A 02/10/2013    Procedure: INTRAOPERATIVE TRANSESOPHAGEAL ECHOCARDIOGRAM;  Surgeon: Grace Isaac, MD;  Location: [place];  Service: Open Heart Surgery;  Laterality: N/A;  . Transthoracic echocardiogram  03/23/2013    Post-Op Echo #1: EF 50-55%, Mild Conc LVH, Mild HK of Inferolateral wall with paradoxical septal motion (c/w post-op state); Well seated 23 mm Bioprosthetic Valve (area ~1.23 cm2 - normal for valve); Mild MR  . Cardiac catheterization  August 2014    Nonobstructive 30-50% RCA. No significant LAD disease. 30% mid circumflex.  . Peripheral vascular angiogram  07/19/1994    No intervention-recommend medical therapy  . Peripheral vascular angiogram  01/17/1995    Right SFA 80-90# lesion, dilatation performed with a Cordis "Optz 5" 35mm x 2cm, inflated a 4-7.5atm for 40 to 70 seconds. Completion angiogram done by hand with "bolus chase" runoff from Primal SFA to foot. Resutling in reduction of 80-90% to 20% with excellent flow and without dissection  . Arterial doppler - pre-cabg  02/06/2013    Bilateral 1-39% ICA stenosis  . Cardiac catheterization  11/10/2009    No intervention - recommend medical management  . Cardiovascular stress test  06/24/2012    Diffuse LV hypokinesis with moderately depressed systolic function and EF 06%,  . Peripheral vascular angiogram  09/10/2013    Occluded left SFA, heavily calcified with reconstitution at adductor canal. Three-vessel runof.;; Right lower extremity noted 50% ostial right common iliac stenosis with roughly 20 mm gradient. Not considered significant.f     No Known Allergies Medications Reviewed in [place] and Family History reviewed in Epic  ROS: A comprehensive  Review of Systems -was performed Review of Systems  Constitutional: Negative for weight loss.        Still tires relatively easily but not as much as before  HENT: Negative for nosebleeds.   Respiratory: Positive for shortness of breath (Exertional dyspnea as indicated above). Negative for cough and wheezing.   Cardiovascular: Positive for claudication.  Gastrointestinal: Negative for blood in stool and melena.  Genitourinary: Negative for hematuria.  Musculoskeletal: Positive for back pain and joint pain.  Neurological: Negative for headaches.  Endo/Heme/Allergies: Bruises/bleeds easily.  Psychiatric/Behavioral: Negative.   All other systems reviewed and are negative.    PHYSICAL EXAM BP 118/66 mmHg  Pulse 53  Ht 5\' 5"  (1.651 m)  Wt 151 lb 12.8 oz (68.856 kg)  BMI 25.26 kg/m2 General appearance: alert and oriented x3, cooperative, appears stated  age, no distress and well-nourished/well-groomed. Answers questions appropriately. HEENT: [place]/AT, EOMI, MMM, anicteric sclera Neck: no adenopathy, no JVD, supple, trachea midline, thyroid not enlarged; faint carotid bruits bilaterally; normal carotid upstroke without delay  Lungs: CTAB, normal percussion bilaterally and nonlabored, good air movement. Just mostly diminished throughout. No rales or rhonchi. Heart: normal apical impulse, regular rate and rhythm, Dsitant heart sounds - S1: normal, S2: physiologically split, early systolic 1/6 c-d SEM  at 2nd right intercostal space, radiates to carotids, no click, no rub and no other murmur. Abdomen: soft, non-tender; bowel sounds normal; no masses,  no organomegaly Extremities: extremities normal, atraumatic, no cyanosis or edema Pulses: Significantly diminished lower 70 pulses. Palpable but faint/1+ right DP and PT. Left DP PT are thready to barely palpable. Neurologic: Grossly normal  UMP:NTIRWERXV today: yes Sinus Bradycardia, 53 bpm. LAD/LAFB (-69), RBBB with PVC. -- stable EKG  Recent  Labs:   Chemistry      Component Value Date/Time   NA 137 07/21/2014 0901   K 5.0 07/21/2014 0901   CL 104 07/21/2014 0901   CO2 26 07/21/2014 0901   BUN 23 07/21/2014 0901   CREATININE 1.28 07/21/2014 0901   CREATININE 1.08 02/15/2013 0520      Component Value Date/Time   CALCIUM 10.3 07/21/2014 0901   ALKPHOS 65 07/21/2014 0901   AST 23 07/21/2014 0901   ALT 18 07/21/2014 0901   BILITOT 0.4 07/21/2014 0901     Lab Results  Component Value Date   CHOL 169 07/21/2014   HDL 43 07/21/2014   LDLCALC 96 07/21/2014   TRIG 151* 07/21/2014   CHOLHDL 3.9 07/21/2014   ASSESSMENT / PLAN:  Aortic valve replaced Stable murmur. Likely recheck echocardiogram prior to followup visit   CAD S/P PTCA of CFX X 2 in '93 No active angina symptoms or prior symptoms. Tolerating low-dose beta blocker twice a day along with ACE inhibitor. Is on calcium channel blocker for added antianginal effect. On aspirin plus Pletal. Also on statin plus Zetia. None short of CAD for preop catheterization prior to aVR.   PAF (paroxysmal atrial fibrillation), post op No evidence of recurrence since his operation. On beta blocker for rate control no anticoagulation since note recurrent episodes   Peripheral vascular disease - s/p L Iliac stent His iliac stents he has been doing okay, however he has SFA occlusion would probably require femoropopliteal bypass. Still in the process of considering his options. Continue to walk. On CAD medications with the addition of Pletal. I congratulated him on his smoking cessation.   Dyslipidemia, goal LDL below 70 Labs are not quite at goal. He is on 40 of atorvastatin. I will switch him from atorvastatin to Crestor which would hopefully provide more potency. He is not at goal for his PAD and CAD.   Essential hypertension Blood pressure looks much better today. We have increased his amlodipine last time seems to be doing well.   Exertional dyspnea Seems to be  actually better than it was when I last saw him. He did not really comment on this too much. Likely would improve blood pressure and diastolic dysfunction with improved. Smoking cessation probably helps lung function.   Bruit He does have soft bruits bilaterally. Will recheck carotid Dopplers to see if there is progression of disease.     Orders Placed This Encounter  Procedures  . EKG 12-Lead  . 2D Echocardiogram without contrast    Standing Status: Future     Number of Occurrences:  Standing Expiration Date: 08/10/2015    Order Specific Question:  Type of Echo    Answer:  Complete    Order Specific Question:  Where should this test be performed    Answer:  [facility]    Order Specific Question:  Reason for exam-Echo    Answer:  Aortic Valve Disorder 424.1 / I35.9  . Doppler carotid    Standing Status: Future     Number of Occurrences:      Standing Expiration Date: 08/10/2015    Order Specific Question:  Laterality    Answer:  Bilateral    Order Specific Question:  Where should this test be performed:    Answer:  [facility]  . Lower Extremity Arterial Doppler Bilateral    Standing Status: Future     Number of Occurrences:      Standing Expiration Date: 08/10/2015    Order Specific Question:  Laterality    Answer:  Bilateral    Order Specific Question:  Where should this test be performed:    Answer:  [facility]   Meds ordered this encounter  Medications  . amLODipine (NORVASC) 10 MG tablet    Sig: Take 1 tablet (10 mg total) by mouth daily.    Dispense:  90 tablet    Refill:  3  . DISCONTD: atorvastatin (LIPITOR) 40 MG tablet    Sig: Take 1 tablet (40 mg total) by mouth at bedtime.    Dispense:  90 tablet    Refill:  3  . ezetimibe (ZETIA) 10 MG tablet    Sig: Take 1 tablet (10 mg total) by mouth daily.    Dispense:  90 tablet    Refill:  3  . rosuvastatin (CRESTOR) 40 MG tablet    Sig: Take 1 tablet (40 mg total) by mouth daily.     Dispense:  90 tablet    Refill:  3     Followup: Roughly 6 months   HARDING, Leonie Green, M.D., M.S. Interventional Cardiologist   Pager # [phone] 08/12/2014

## 2014-08-12 ENCOUNTER — Encounter: Payer: Self-pay | Admitting: Cardiology

## 2014-08-12 DIAGNOSIS — R0989 Other specified symptoms and signs involving the circulatory and respiratory systems: Secondary | ICD-10-CM | POA: Insufficient documentation

## 2014-08-12 NOTE — Assessment & Plan Note (Signed)
Blood pressure looks much better today. We have increased his amlodipine last time seems to be doing well.

## 2014-08-12 NOTE — Assessment & Plan Note (Signed)
Labs are not quite at goal. He is on 40 of atorvastatin. I will switch him from atorvastatin to Crestor which would hopefully provide more potency. He is not at goal for his PAD and CAD.

## 2014-08-12 NOTE — Assessment & Plan Note (Addendum)
His iliac stents he has been doing okay, however he has SFA occlusion would probably require femoropopliteal bypass. Still in the process of considering his options. Continue to walk. On CAD medications with the addition of Pletal. I congratulated him on his smoking cessation.

## 2014-08-12 NOTE — Assessment & Plan Note (Signed)
No active angina symptoms or prior symptoms. Tolerating low-dose beta blocker twice a day along with ACE inhibitor. Is on calcium channel blocker for added antianginal effect. On aspirin plus Pletal. Also on statin plus Zetia. None short of CAD for preop catheterization prior to aVR.

## 2014-08-12 NOTE — Assessment & Plan Note (Signed)
He does have soft bruits bilaterally. Will recheck carotid Dopplers to see if there is progression of disease.

## 2014-08-12 NOTE — Assessment & Plan Note (Addendum)
Stable murmur. Likely recheck echocardiogram prior to followup visit

## 2014-08-12 NOTE — Assessment & Plan Note (Signed)
No evidence of recurrence since his operation. On beta blocker for rate control no anticoagulation since note recurrent episodes

## 2014-08-12 NOTE — Assessment & Plan Note (Signed)
Seems to be actually better than it was when I last saw him. He did not really comment on this too much. Likely would improve blood pressure and diastolic dysfunction with improved. Smoking cessation probably helps lung function.

## 2015-02-07 ENCOUNTER — Ambulatory Visit (INDEPENDENT_AMBULATORY_CARE_PROVIDER_SITE_OTHER): Payer: Medicare Other | Admitting: Cardiology

## 2015-02-07 ENCOUNTER — Encounter: Payer: Self-pay | Admitting: Cardiology

## 2015-02-07 VITALS — BP 150/80 | HR 67 | Ht 65.0 in | Wt 146.0 lb

## 2015-02-07 DIAGNOSIS — R42 Dizziness and giddiness: Secondary | ICD-10-CM

## 2015-02-07 DIAGNOSIS — I2510 Atherosclerotic heart disease of native coronary artery without angina pectoris: Secondary | ICD-10-CM | POA: Diagnosis not present

## 2015-02-07 DIAGNOSIS — I70219 Atherosclerosis of native arteries of extremities with intermittent claudication, unspecified extremity: Secondary | ICD-10-CM

## 2015-02-07 DIAGNOSIS — Z79899 Other long term (current) drug therapy: Secondary | ICD-10-CM

## 2015-02-07 DIAGNOSIS — Z954 Presence of other heart-valve replacement: Secondary | ICD-10-CM

## 2015-02-07 DIAGNOSIS — E785 Hyperlipidemia, unspecified: Secondary | ICD-10-CM | POA: Diagnosis not present

## 2015-02-07 DIAGNOSIS — I739 Peripheral vascular disease, unspecified: Secondary | ICD-10-CM

## 2015-02-07 DIAGNOSIS — Z9861 Coronary angioplasty status: Secondary | ICD-10-CM | POA: Diagnosis not present

## 2015-02-07 DIAGNOSIS — I10 Essential (primary) hypertension: Secondary | ICD-10-CM

## 2015-02-07 DIAGNOSIS — Z952 Presence of prosthetic heart valve: Secondary | ICD-10-CM

## 2015-02-07 DIAGNOSIS — R0989 Other specified symptoms and signs involving the circulatory and respiratory systems: Secondary | ICD-10-CM

## 2015-02-07 LAB — COMPREHENSIVE METABOLIC PANEL
ALBUMIN: 4.5 g/dL (ref 3.6–5.1)
ALT: 22 U/L (ref 9–46)
AST: 22 U/L (ref 10–35)
Alkaline Phosphatase: 60 U/L (ref 40–115)
BILIRUBIN TOTAL: 0.4 mg/dL (ref 0.2–1.2)
BUN: 26 mg/dL — AB (ref 7–25)
CHLORIDE: 104 mmol/L (ref 98–110)
CO2: 27 mmol/L (ref 20–31)
CREATININE: 1.45 mg/dL — AB (ref 0.70–1.18)
Calcium: 10.8 mg/dL — ABNORMAL HIGH (ref 8.6–10.3)
Glucose, Bld: 98 mg/dL (ref 65–99)
Potassium: 5.6 mmol/L — ABNORMAL HIGH (ref 3.5–5.3)
SODIUM: 138 mmol/L (ref 135–146)
TOTAL PROTEIN: 6.9 g/dL (ref 6.1–8.1)

## 2015-02-07 LAB — LIPID PANEL
CHOLESTEROL: 178 mg/dL (ref 125–200)
HDL: 51 mg/dL (ref >=40)
LDL CALC: 99 mg/dL (ref <130)
Total CHOL/HDL Ratio: 3.5 Ratio (ref <=5.0)
Triglycerides: 141 mg/dL (ref <150)
VLDL: 28 mg/dL (ref <30)

## 2015-02-07 MED ORDER — LISINOPRIL 20 MG PO TABS: 20.0000 mg | ORAL_TABLET | Freq: Every day | ORAL | Status: DC

## 2015-02-07 MED ORDER — ATORVASTATIN CALCIUM 40 MG PO TABS: 40.0000 mg | ORAL_TABLET | Freq: Every day | ORAL | Status: DC

## 2015-02-07 MED ORDER — CILOSTAZOL 100 MG PO TABS: 100.0000 mg | ORAL_TABLET | Freq: Two times a day (BID) | ORAL | Status: DC

## 2015-02-07 MED ORDER — METOPROLOL TARTRATE 25 MG PO TABS: 12.5000 mg | ORAL_TABLET | Freq: Two times a day (BID) | ORAL | Status: DC

## 2015-02-07 NOTE — Progress Notes (Signed)
PCP: Phineas Inches, MD  Clinic Note: Chief Complaint  Patient presents with  . 6 MONTHS    Patient has felt light headed, dizzy, and SOB.  [name] Coronary Artery Disease  . Cardiac Valve Problem  . Claudication    HPI: Ralph Drake is a 80 y.o. male with a PMH below who presents today for followup visit for CAD, PAD & history of AVR in September of 2014. He is a former patient of Dr. Terance Ice. His AVR was on 02/2013 with a Edwards Magna 40m Bovine Pericardial valve - normal pre-op cath. . Followup echocardiogram in October 2014 revealed excellent seating of the valve with minimal residual gradient.When I saw him in Sept 2015 and he was feeling fatigued and tired. That seems to have improved. He has also successfully quit smoking..Marland KitchenHis PAD is followed by Dr. BTrula Slade-- occlusive Iliofemoral disease, opted for medical management.  TKAPONO LUHNwas last seen in March - no active angina or HF Sx.   Recent Hospitalizations: n/a  Studies Reviewed: n/a  Interval History: TDionis doing quite well overall cardiac standpoint. He really denies any active anginal or heart failure symptoms.  He really says that he doesn't do much activity per se , being limited mostly by his claudication symptoms -- he really notes in the past and thighs. This is really what limits his activity. He denies any PND, orthopnea or edema.  He also notes his baseline dyspnea but is more limited by claudication than dyspnea or any potential heart failure anginal symptoms.  He has not had any syncopal events or/amaurosis fugax, but does have "dizzy spells" that have been going on for the last several months.  He describes some symptoms that would be consistent with orthostatic dizziness, but most of it seems to be more positional with how he turns his head. Most notably when he turns his head to the right or  Looks, he becomes very dizzy and disoriented and has fallen down. He doesn't describe a sensation of  spinning or movement it just simply that he gets lightheaded and dizzy. At least one occasion he fell walking up and bruised himself pretty bad.  He otherwise denies any rapid irregular heartbeats or palpitations.  Past Medical History  Diagnosis Date  . CAD S/P percutaneous coronary angioplasty 1993    POBA to Cx x 2 occasions; mild to moderate/nonobstructive CAD on preop cath August 2014  . Severe aortic stenosis by prior echocardiogram     per Dr. WRollene Fare2012; referred for aortic valve replacement  . S/P AVR (aortic valve replacement) 02/10/2013    23 Edwards Magna Ease Pericardial Tissue Valve  . Peripheral vascular disease      H/O R SFA stent, L Iliac Stent 1996;;  Lower Extremity Angiography April 2015: Occluded left SFA reconstituting at end of the canal, heavily calcified. Three-vessel runoff..;; RCA 50% ostial right common iliac, patent SFA stent -> Referred to Dr. BTrula Sladeof Vasc Sgx.  . Shortness of breath     Chronic  . Hypertension   . Dyslipidemia, goal LDL below 70   . Olecranon bursitis of left elbow   . Trigger ring finger of left hand   . Osteoarthritis   . Bradycardia   . GERD (gastroesophageal reflux disease)   . Former heavy tobacco smoker      quit in October 2014  . Atrial fibrillation     Past Surgical History  Procedure Laterality Date  . Tonsillectomy  as child  . Right hand surgery    . Trigger finger release  07/23/2011    Procedure: RELEASE TRIGGER FINGER/A-1 PULLEY;  Surgeon: Lorn Junes, MD;  Location: [place];  Service: Orthopedics;  Laterality: Left;  release trigger left ring finger patient had supraclavicular block in preop  . Olecranon bursectomy  07/23/2011    Procedure: OLECRANON BURSA;  Surgeon: Lorn Junes, MD;  Location: [place];  Service: Orthopedics;  Laterality: Left;  excision olecranon bursa left elbow patient had supraclaviular block in preop  . Coronary angioplasty  1993    Circumflex  x 2   . Rotator cuff repair Right   . Aortic valve replacement  02/10/2013    Dr. Servando Snare: [address] Ease Pericardia  . Iliac artery stent Left 1996  . Superficial femoral atery stent Right 1996  . Aortic valve replacement N/A 02/10/2013    AORTIC VALVE REPLACEMENT (AVR);  Surgeon: Grace Isaac, MD;  Location: [place];  Service: Open Heart Surgery;  Laterality: N/A  . Intraoperative transesophageal echocardiogram N/A 02/10/2013    Procedure: INTRAOPERATIVE TRANSESOPHAGEAL ECHOCARDIOGRAM;  Surgeon: Grace Isaac, MD;  Location: [place];  Service: Open Heart Surgery;  Laterality: N/A;  . Transthoracic echocardiogram  03/23/2013    Post-Op Echo #1: EF 50-55%, Mild Conc LVH, Mild HK of Inferolateral wall with paradoxical septal motion (c/w post-op state); Well seated 23 mm Bioprosthetic Valve (area ~1.23 cm2 - normal for valve); Mild MR  . Cardiac catheterization  August 2014    Nonobstructive 30-50% RCA. No significant LAD disease. 30% mid circumflex.  . Peripheral vascular angiogram  07/19/1994    No intervention-recommend medical therapy  . Peripheral vascular angiogram  01/17/1995    Right SFA 80-90# lesion, dilatation performed with a Cordis "Optz 5" 65m x 2cm, inflated a 4-7.5atm for 40 to 70 seconds. Completion angiogram done by hand with "bolus chase" runoff from Primal SFA to foot. Resutling in reduction of 80-90% to 20% with excellent flow and without dissection  . Arterial doppler - pre-cabg  02/06/2013    Bilateral 1-39% ICA stenosis  . Cardiac catheterization  11/10/2009    No intervention - recommend medical management  . Cardiovascular stress test  06/24/2012    Diffuse LV hypokinesis with moderately depressed systolic function and EF 372%  . Peripheral vascular angiogram  09/10/2013    Occluded left SFA, heavily calcified with reconstitution at adductor canal. Three-vessel runof.;; Right lower extremity noted 50% ostial right common iliac stenosis with roughly 20 mm gradient. Not  considered significant.f  . Left and right heart catheterization with coronary angiogram N/A 01/28/2013    Procedure: LEFT AND RIGHT HEART CATHETERIZATION WITH CORONARY ANGIOGRAM;  Surgeon: DLeonie Man MD;  [facility];  Service: Cardiovascular;  Laterality: N/A;  . Lower extremity angiogram N/A 09/10/2013    Procedure: LOWER EXTREMITY ANGIOGRAM;  Surgeon: JLorretta Harp MD;  [facility];  Service: Cardiovascular;  Laterality: N/A;  . Abdominal angiogram  09/10/2013    Procedure: ABDOMINAL ANGIOGRAM;  Surgeon: JLorretta Harp MD;  [facility];  Service: Cardiovascular;;    ROS: A comprehensive was performed. Review of Systems  HENT: Negative for nosebleeds.   Respiratory: Positive for shortness of breath (Chronic at baseline).   Cardiovascular: Positive for claudication.  Gastrointestinal: Negative for blood in stool and melena.  Genitourinary: Negative for hematuria.  Musculoskeletal: Positive for joint pain (Knee pain) and falls.  Neurological: Positive  for dizziness (See history of present illness) and weakness (Legs feel weak when he is walking.).  Endo/Heme/Allergies: Does not bruise/bleed easily.  All other systems reviewed and are negative.    Prior to Admission medications   Medication Sig Start Date End Date Taking? Authorizing Provider  amLODipine (NORVASC) 10 MG tablet Take 1 tablet (10 mg total) by mouth daily. 08/10/14  Yes Leonie Man, MD  aspirin EC 325 MG EC tablet Take 1 tablet (325 mg total) by mouth daily. 02/17/13  Yes Donielle Liston Alba, PA-C  cilostazol (PLETAL) 50 MG tablet Take 1 tablet (50 mg total) by mouth 2 (two) times daily. 02/08/14  Yes Leonie Man, MD  ezetimibe (ZETIA) 10 MG tablet Take 1 tablet (10 mg total) by mouth daily. 08/10/14  Yes Leonie Man, MD  fish oil-omega-3 fatty acids 1000 MG capsule Take 1 g by mouth 2 (two) times daily.    Yes Historical Provider, MD  lisinopril (PRINIVIL,ZESTRIL) 20 MG tablet  Take 1 tablet (20 mg total) by mouth at bedtime. 02/08/14  Yes Leonie Man, MD  metoprolol tartrate (LOPRESSOR) 25 MG tablet Take 12.5 mg by mouth 2 (two) times daily. 02/08/14  Yes Leonie Man, MD  multivitamin [facility]) per tablet Take 1 tablet by mouth daily.   Yes Historical Provider, MD  omeprazole (PRILOSEC) 20 MG capsule Take 20 mg by mouth daily as needed. 05/27/13  Yes Leonie Man, MD  rosuvastatin (CRESTOR) 40 MG tablet Take 1 tablet (40 mg total) by mouth daily. 08/10/14  Yes Leonie Man, MD   No Known Allergies   Social History   Social History  . Marital Status: Married    Spouse Name: N/A  . Number of Children: N/A  . Years of Education: N/A   Social History Main Topics  . Smoking status: Former Smoker -- 0.50 packs/day for 60 years    Types: Cigarettes    Quit date: 01/31/2013  . Smokeless tobacco: Never Used  . Alcohol Use: No  . Drug Use: No  . Sexual Activity: Not Asked   Other Topics Concern  . None   Social History Narrative   Married with no children. He long-term smoker who quit prior to his aVR in 2014.    Up until his operation he worked part-time at [employer].  He is not yet gone back to work postoperatively.   He chose not be cardiac rehabilitation, but has gone back to walking exercising daily.   Family History  Problem Relation Age of Onset  . Hypertension Mother   . Cancer Mother     Oral  . Stroke Father     Wt Readings from Last 3 Encounters:  02/07/15 146 lb (66.225 kg)  08/10/14 151 lb 12.8 oz (68.856 kg)  06/28/14 152 lb 9.6 oz (69.219 kg)    PHYSICAL EXAM BP 150/80 mmHg  Pulse 67  Ht '5\' 5"'$  (1.651 m)  Wt 146 lb (66.225 kg)  BMI 24.30 kg/m2 General appearance: alert and oriented x3, cooperative, appears stated age, no distress and well-nourished/well-groomed. Answers questions appropriately. HEENT: [place]/AT, EOMI, MMM, anicteric sclera Neck: no adenopathy, no JVD, supple, trachea midline, thyroid not  enlarged; faint carotid bruits bilaterally; normal carotid upstroke without delay  Lungs: CTAB, normal percussion bilaterally and nonlabored, good air movement. Just mostly diminished throughout. No rales or rhonchi. Heart: normal apical impulse, regular rate and rhythm, Dsitant heart sounds - S1: normal, S2: physiologically split, early systolic 1/6 c-d SEM  at 2nd right intercostal space, radiates to carotids, no click, no rub and no other murmur. Abdomen: soft, non-tender; bowel sounds normal; no masses, no organomegaly Extremities: extremities normal, atraumatic, no cyanosis or edema Pulses: Significantly diminished lower 70 pulses. Palpable but faint/1+ right DP and PT. Left DP PT are thready to barely palpable. Neurologic: Grossly normal    Adult ECG Report - n/a   Other studies Reviewed: Additional studies/ records that were reviewed today include:  Recent Labs:   Lab Results  Component Value Date   CHOL 178 02/07/2015   HDL 51 02/07/2015   LDLCALC 99 02/07/2015   TRIG 141 02/07/2015   CHOLHDL 3.5 02/07/2015     ASSESSMENT / PLAN: Problem List Items Addressed This Visit    Bruit   Relevant Orders   VAS US CAROTID   Comprehensive metabolic panel (Completed)   Lipid panel (Completed)   CAD S/P PTCA of CFX X 2 in '93 (Chronic)    His last intervention was several years ago in 1993, and he has not had any real anginal symptoms since. He has had a cardiac catheterization prior to his valve surgery with nonocclusive disease. He is on low-dose beta blocker along with ACE inhibitor. He also takes statin plus Zetia,and aspirin. He also has amlodipine for potential antianginal effect.      Relevant Medications   lisinopril (PRINIVIL,ZESTRIL) 20 MG tablet   metoprolol tartrate (LOPRESSOR) 25 MG tablet   atorvastatin (LIPITOR) 40 MG tablet   Other Relevant Orders   VAS US CAROTID   Comprehensive metabolic panel (Completed)   Lipid panel (Completed)   Dizziness - Primary     His this does not seem like it is related to an arrhythmia or necessarily orthostatic changes. Sounds more like benign positional vertigo, however be association with looking up makes me concerned for possible vertebral artery disease -- he has significant CAD & PAD.  Plan : Carotid dopplers - hope to evaluate vertebral arteries. Try to avoid actions that lead to symptoms. Discuss with PCP - ?  BPV      Relevant Orders   VAS US CAROTID   Comprehensive metabolic panel (Completed)   Lipid panel (Completed)   Dyslipidemia, goal LDL below 70 (Chronic)    Not quite at goal. The plan was to switch him to Crestor, however he switched back to atorvastatin because of cost issues. This point I think he just simply need to recheck his labs and see where we stand. He may need more aggressive treatment including potentially referral for PC SK9 inhibitor treatment.      Relevant Medications   lisinopril (PRINIVIL,ZESTRIL) 20 MG tablet   metoprolol tartrate (LOPRESSOR) 25 MG tablet   atorvastatin (LIPITOR) 40 MG tablet   Other Relevant Orders   VAS US CAROTID   Comprehensive metabolic panel (Completed)   Lipid panel (Completed)   Essential hypertension (Chronic)   Relevant Medications   lisinopril (PRINIVIL,ZESTRIL) 20 MG tablet   metoprolol tartrate (LOPRESSOR) 25 MG tablet   atorvastatin (LIPITOR) 40 MG tablet   Other Relevant Orders   VAS US CAROTID   Comprehensive metabolic panel (Completed)   Lipid panel (Completed)   Moderate claudication (Chronic)    He is pretty much lifestyle limiting claudication but has opted for medical management and continued walking. He is on aggressive cardiovascular risk factor treatment is indicated and CAD.   I will increase his Pletal to 100 mg twice a day. Continue to f/u with Dr. Trula Slade.  Peripheral vascular disease - s/p L Iliac stent (Chronic)    Patent iliac stents but SFA occlusion noted on the left.would likely require femoropopliteal bypass,  but he has opted for medical management now. Has successfully quit smoking. Continue to recommend exercise to increase collateral flow. I have increased the Pletal.      Relevant Medications   lisinopril (PRINIVIL,ZESTRIL) 20 MG tablet   metoprolol tartrate (LOPRESSOR) 25 MG tablet   atorvastatin (LIPITOR) 40 MG tablet   Other Relevant Orders   VAS US CAROTID   Comprehensive metabolic panel (Completed)   Lipid panel (Completed)   S/P tissue AVR 02/11/13 (Chronic)    Plan has been to followup an echocardiogram roughly 3 years out. This would be shortly after six-month followup.      Relevant Orders   VAS US CAROTID   Comprehensive metabolic panel (Completed)   Lipid panel (Completed)    Other Visit Diagnoses    Drug therapy        Relevant Orders    Comprehensive metabolic panel (Completed)    Lipid panel (Completed)       Current medicines are reviewed at length with the patient today. (+/- concerns) n/a The following changes have been made: increase Pletal to 100 mg bid Studies Ordered:   Orders Placed This Encounter  Procedures  . Comprehensive metabolic panel  . Lipid panel  Carotid Dopplers   F/u 6 months (plan to order Echo after f/u visit)   Leonie Man, M.D., M.S. Interventional Cardiologist   Pager # [phone]

## 2015-02-07 NOTE — Patient Instructions (Signed)
Your physician has requested that you have a carotid duplex. This test is an ultrasound of the carotid arteries in your neck. It looks at blood flow through these arteries that supply the brain with blood. Allow one hour for this exam. There are no restrictions or special instructions.  LABS CMP ,LIPID  INCREASE PLETAL  (CILOSTAZAL)100 MG TWICE A DAY  Your physician wants you to follow-up in [place]. You will receive a reminder letter in the mail two months in advance. If you don't receive a letter, please call our office to schedule the follow-up appointment.

## 2015-02-09 ENCOUNTER — Encounter: Payer: Self-pay | Admitting: Cardiology

## 2015-02-09 DIAGNOSIS — I739 Peripheral vascular disease, unspecified: Secondary | ICD-10-CM | POA: Insufficient documentation

## 2015-02-09 DIAGNOSIS — R42 Dizziness and giddiness: Secondary | ICD-10-CM | POA: Insufficient documentation

## 2015-02-09 NOTE — Assessment & Plan Note (Signed)
Patent iliac stents but SFA occlusion noted on the left.would likely require femoropopliteal bypass, but he has opted for medical management now. Has successfully quit smoking. Continue to recommend exercise to increase collateral flow. I have increased the Pletal.

## 2015-02-09 NOTE — Assessment & Plan Note (Signed)
Not quite at goal. The plan was to switch him to Crestor, however he switched back to atorvastatin because of cost issues. This point I think he just simply need to recheck his labs and see where we stand. He may need more aggressive treatment including potentially referral for PC SK9 inhibitor treatment.

## 2015-02-09 NOTE — Assessment & Plan Note (Signed)
Plan has been to followup an echocardiogram roughly 3 years out. This would be shortly after six-month followup.

## 2015-02-09 NOTE — Assessment & Plan Note (Signed)
He is pretty much lifestyle limiting claudication but has opted for medical management and continued walking. He is on aggressive cardiovascular risk factor treatment is indicated and CAD.   I will increase his Pletal to 100 mg twice a day. Continue to f/u with Dr. Trula Slade.

## 2015-02-09 NOTE — Assessment & Plan Note (Signed)
His last intervention was several years ago in 1993, and he has not had any real anginal symptoms since. He has had a cardiac catheterization prior to his valve surgery with nonocclusive disease. He is on low-dose beta blocker along with ACE inhibitor. He also takes statin plus Zetia,and aspirin. He also has amlodipine for potential antianginal effect.

## 2015-02-09 NOTE — Assessment & Plan Note (Signed)
His this does not seem like it is related to an arrhythmia or necessarily orthostatic changes. Sounds more like benign positional vertigo, however be association with looking up makes me concerned for possible vertebral artery disease -- he has significant CAD & PAD.  Plan : Carotid dopplers - hope to evaluate vertebral arteries. Try to avoid actions that lead to symptoms. Discuss with PCP - ?  BPV

## 2015-02-16 ENCOUNTER — Other Ambulatory Visit: Payer: Self-pay | Admitting: Cardiology

## 2015-02-16 DIAGNOSIS — R42 Dizziness and giddiness: Secondary | ICD-10-CM

## 2015-02-16 DIAGNOSIS — R0989 Other specified symptoms and signs involving the circulatory and respiratory systems: Secondary | ICD-10-CM

## 2015-02-17 ENCOUNTER — Telehealth: Payer: Self-pay | Admitting: *Deleted

## 2015-02-17 DIAGNOSIS — E875 Hyperkalemia: Secondary | ICD-10-CM

## 2015-02-17 DIAGNOSIS — I10 Essential (primary) hypertension: Secondary | ICD-10-CM

## 2015-02-17 DIAGNOSIS — R799 Abnormal finding of blood chemistry, unspecified: Secondary | ICD-10-CM

## 2015-02-17 NOTE — Telephone Encounter (Signed)
Left message to call back in regards to lab Forward lab to Dr Brayton Layman -PCP

## 2015-02-17 NOTE — Telephone Encounter (Signed)
-----   Message from Leonie Man, MD sent at 02/16/2015 11:14 AM EDT ----- So for the most part and function looks a little bit worse but mostly stable. Potassium is little elevated. I would like to add HCTZ 12.5 mg daily that he will take with his lisinopril. -We can recheck chemistries in 3 weeks  Lipid panel is essentially stable-he did not take Crestor because of cost issues. She is therefore on atorvastatin and Zetia. With his coronary and vascular disease, he does need better control. I would like to refer him to Tommy Medal - probably after follow-up labs., [place] for help with more aggressive lipid management (that he will be able to afford). I would also like her to help monitor BP.  [place], Leonie Green, MD   Pls fwd to PCP: Bernerd Limbo, MD

## 2015-02-18 ENCOUNTER — Telehealth: Payer: Self-pay | Admitting: Pharmacist Clinician (PhC)/ Clinical Pharmacy Specialist

## 2015-02-18 MED ORDER — HYDROCHLOROTHIAZIDE 12.5 MG PO CAPS
12.5000 mg | ORAL_CAPSULE | Freq: Every day | ORAL | Status: DC
Start: 2015-02-18 — End: 2015-03-10

## 2015-02-18 NOTE — Telephone Encounter (Signed)
Pt's wife is returning your call  Thanks

## 2015-02-18 NOTE — Telephone Encounter (Signed)
LMOM for patient to call - need to set up appt for hypertension and lipid review

## 2015-02-18 NOTE — Addendum Note (Signed)
Addended by: Raiford Simmonds on: 02/18/2015 03:19 PM   Modules accepted: Orders

## 2015-02-18 NOTE — Telephone Encounter (Addendum)
Spoke to patient. Result given . Verbalized understanding HCTZ 12.5 MG capsule #30  e-sent  to [facility]. Lab order -bmp Patient aware will go to lab in 3 weeks-  Patient wanted RN to let [place]. RN informed Erasmo Downer.

## 2015-02-18 NOTE — Telephone Encounter (Signed)
Spoke with patient, appt set

## 2015-02-23 ENCOUNTER — Ambulatory Visit (HOSPITAL_COMMUNITY)
Admission: RE | Admit: 2015-02-23 | Discharge: 2015-02-23 | Disposition: A | Discharge status: Home / Self Care | Payer: Medicare Other | Source: Ambulatory Visit | Attending: Cardiovascular Disease | Admitting: Cardiovascular Disease

## 2015-02-23 DIAGNOSIS — I6523 Occlusion and stenosis of bilateral carotid arteries: Secondary | ICD-10-CM | POA: Insufficient documentation

## 2015-02-23 DIAGNOSIS — R42 Dizziness and giddiness: Secondary | ICD-10-CM | POA: Diagnosis not present

## 2015-02-23 DIAGNOSIS — E785 Hyperlipidemia, unspecified: Secondary | ICD-10-CM | POA: Insufficient documentation

## 2015-02-23 DIAGNOSIS — I10 Essential (primary) hypertension: Secondary | ICD-10-CM | POA: Diagnosis not present

## 2015-02-23 DIAGNOSIS — I2510 Atherosclerotic heart disease of native coronary artery without angina pectoris: Secondary | ICD-10-CM | POA: Insufficient documentation

## 2015-02-23 DIAGNOSIS — R0989 Other specified symptoms and signs involving the circulatory and respiratory systems: Secondary | ICD-10-CM

## 2015-03-01 ENCOUNTER — Telehealth: Payer: Self-pay | Admitting: *Deleted

## 2015-03-01 DIAGNOSIS — I6523 Occlusion and stenosis of bilateral carotid arteries: Secondary | ICD-10-CM

## 2015-03-01 DIAGNOSIS — R0989 Other specified symptoms and signs involving the circulatory and respiratory systems: Secondary | ICD-10-CM

## 2015-03-01 NOTE — Telephone Encounter (Signed)
-----   Message from Leonie Man, MD sent at 02/24/2015  7:32 AM EDT ----- Good News on Carotid Doppler results.  Heterogeneous plaque, bilaterally with 1-39% bilateral ICA stenosis. Normal subclavian arteries, bilaterally. Patent vertebral arteries with antegrade flow.  F/u in 2 Ys. Pls fwd to PCP: Bernerd Limbo, MD

## 2015-03-01 NOTE — Telephone Encounter (Signed)
Spoke to patient. Result given . Verbalized understanding Faxed doppler to Dr Coletta Memos

## 2015-03-01 NOTE — Telephone Encounter (Signed)
ORDER PLACED FOR 2 YEAR FOLLOW UP

## 2015-03-07 ENCOUNTER — Telehealth: Payer: Self-pay | Admitting: *Deleted

## 2015-03-07 LAB — BASIC METABOLIC PANEL
BUN: 27 mg/dL — AB (ref 7–25)
CHLORIDE: 100 mmol/L (ref 98–110)
CO2: 26 mmol/L (ref 20–31)
CREATININE: 1.53 mg/dL — AB (ref 0.70–1.18)
Calcium: 10.6 mg/dL — ABNORMAL HIGH (ref 8.6–10.3)
Glucose, Bld: 95 mg/dL (ref 65–99)
POTASSIUM: 5 mmol/L (ref 3.5–5.3)
Sodium: 134 mmol/L — ABNORMAL LOW (ref 135–146)

## 2015-03-07 NOTE — Telephone Encounter (Signed)
Spoke to patient. Result given . Verbalized understanding  

## 2015-03-07 NOTE — Telephone Encounter (Signed)
-----   Message from Leonie Man, MD sent at 03/07/2015  6:07 PM EDT ----- Potassium looks better overall.  Kidney function is a bit worse - need to increase fluid intake  -- looks a bit dehydrated.  [place]ARD[place], DAVID Viona Gilmore, MD  Pls forward to Dr. Coletta Memos

## 2015-03-10 ENCOUNTER — Telehealth: Payer: Self-pay | Admitting: Cardiology

## 2015-03-10 MED ORDER — HYDROCHLOROTHIAZIDE 12.5 MG PO CAPS: 12.5000 mg | ORAL_CAPSULE | Freq: Every day | ORAL | Status: DC

## 2015-03-10 NOTE — Telephone Encounter (Signed)
I think the low dose HCTZ will help BP & Potassium  - if he feels woozy/ dizzy or dehydrated - ok to hold every now & then.  Leonie Man, MD

## 2015-03-10 NOTE — Telephone Encounter (Signed)
Spoke to wife. Informed her medication e-sent.and Dr Allison Quarry comments She verbalized understanding.

## 2015-03-10 NOTE — Telephone Encounter (Signed)
Please call his wife,she wants to talk to you about his medicine.

## 2015-03-10 NOTE — Telephone Encounter (Signed)
Wife wanted to know if patient will continue to take HCTZ on long term basis. RN informed wife will have to defer to Dr [name].  He will have to review labs again. Will contact after receive information. If needed long term- prescription will be sent to mail order. wife aware

## 2015-03-17 ENCOUNTER — Ambulatory Visit (INDEPENDENT_AMBULATORY_CARE_PROVIDER_SITE_OTHER): Payer: Medicare Other | Admitting: Pharmacist Clinician (PhC)/ Clinical Pharmacy Specialist

## 2015-03-17 VITALS — Ht 65.0 in | Wt 147.6 lb

## 2015-03-17 DIAGNOSIS — I6523 Occlusion and stenosis of bilateral carotid arteries: Secondary | ICD-10-CM | POA: Diagnosis not present

## 2015-03-17 DIAGNOSIS — E785 Hyperlipidemia, unspecified: Secondary | ICD-10-CM | POA: Diagnosis not present

## 2015-03-17 MED ORDER — ATORVASTATIN CALCIUM 80 MG PO TABS: 80.0000 mg | ORAL_TABLET | Freq: Every day | ORAL | Status: DC

## 2015-03-17 NOTE — Patient Instructions (Signed)
It was nice to meet you today.  Increase atorvastatin to 80 mg once daily.  Continue Zetia (ezetimibe) 10 mg daily.  If this becomes cost prohibitive over the next few months, try decreasing to 1/2 tablet daily.  Repeat labs in early January and see [place] for follow up a week or so later  Cholesterol Cholesterol is a fat. Your body needs a small amount of cholesterol. Cholesterol may build up in your blood vessels. This increases your chance of having a heart attack or stroke. You cannot feel your cholesterol levels. The only way to know your cholesterol level is high is with a blood test. Keep your test results. Work with your doctor to keep your cholesterol at a good level. WHAT DO THE TEST RESULTS MEAN?  Total cholesterol is how much cholesterol is in your blood.  LDL is bad cholesterol. This is the type that can build up. You want LDL to be low.  HDL is good cholesterol. It cleans your blood vessels and carries LDL away. You want HDL to be high.  Triglycerides are fat that the body can burn for energy or store. WHAT ARE GOOD LEVELS OF CHOLESTEROL?  Total cholesterol below 200.  LDL below 100 for people at risk. Below 70 for those at very high risk.  HDL above 50 is good. Above 60 is best.  Triglycerides below 150. HOW CAN I LOWER MY CHOLESTEROL?  Diet. Follow your diet programs as told by your doctor.  Choose fish, white meat chicken, roasted [place], or baked [place]. Try not to eat red meat, fried foods, or processed meats such as sausage and lunch meats.  Eat lots of fresh fruits and vegetables.  Choose whole grains, beans, pasta, potatoes, and cereals.  Use only small amounts of olive, corn, or canola oils.  Try not to eat butter, mayonnaise, shortening, or palm kernel oils.  Try not to eat foods with trans fats.  Drink skim or nonfat milk. Eat low-fat or nonfat yogurt and cheeses. Try not to drink whole milk or cream. Try not to eat ice cream, egg yolks, and full-fat  cheeses.  Healthy desserts include angel food cake, ginger snaps, animal crackers, hard candy, popsicles, and low-fat or nonfat frozen yogurt. Try not to eat pastries, cakes, pies, and cookies.  Exercise. Follow your exercise programs as told by your doctor.  Be more active. You can try gardening, walking, or taking the stairs. Ask your doctor about how you can be more active.  Medicine. Take medicine as told by your doctor.   This information is not intended to replace advice given to you by your health care provider. Make sure you discuss any questions you have with your health care provider.   Document Released: 08/24/2008 Document Revised: 06/18/2014 Document Reviewed: 03/11/2013 Elsevier Interactive Patient Education [employer].

## 2015-03-18 ENCOUNTER — Encounter: Payer: Self-pay | Admitting: Pharmacist Clinician (PhC)/ Clinical Pharmacy Specialist

## 2015-03-18 NOTE — Assessment & Plan Note (Signed)
Patient has a goal LDL of 70 due to extensive PAD as well as CAD.  He has been taking atorvastatin 40 and ezetimibe 10 for some time without any problems.  Dr. [name] had tried to switch him to rosuvastatin 40 mg at his last visit, hoping to get some continued decrease in LDL, however because of the recent switch to generics, his insurance company does not yet recognize the generic and the copay would have been close to $300 per month.  We had a good long discussion about diet and encouraged him to continue with the positive changes he has recently made.  We also discussed the pricing for drugs, and that probably by January, the rosuvastatin would not be as expensive.  For now, however, we are going to increase his atorvastatin to 80 mg daily and I will see him in 3 months for a follow up appointment.

## 2015-03-18 NOTE — Progress Notes (Signed)
03/18/2015 Ralph Drake 29-Jan-1935 [id number]   HPI:  Ralph Drake is a 80 y.o. male patient of Dr Ralph Drake, who presents today for a lipid clinic evaluation.   RF:  CAD (coronary angioplasty x 2 1993),  PAD (SFA stent 1996, SFA occlusion 2015) AVR 2014 (bovine)   Meds: atorvastatin 40 mg daily, ezetimibe 10 mg daily  Family history: father had stroke, mother hypertension  Diet:  Patient has been working hard to improve - quit eating breakfast biscuits (at least twice weekly previously), and has cut back on sweets and white foods  Exercise: considering PAD causing some aching/cramping he still golfs weekly, walks 3 miles/day and works in his yard regularly  Social History:  60 year hx of tobacco abuse, quit in 2014, does not drink alcohol    Labs:   Results for Ralph Drake, Ralph Drake (MRN [id number]) as of 03/18/2015 06:34  Ref. Range 07/21/2014 09:00 02/07/2015 09:16  Cholesterol Latest Ref Range: 125-200 mg/dL 169 178  Triglycerides Latest Ref Range: <150 mg/dL 151 (H) 141  HDL Cholesterol Latest Ref Range: >=40 mg/dL 43 51  LDL (calc) Latest Ref Range: <130 mg/dL 96 99  VLDL Latest Ref Range: <30 mg/dL 30 28  Total CHOL/HDL Ratio Latest Ref Range: <=5.0 Ratio 3.9 3.5    Current Outpatient Prescriptions  Medication Sig Dispense Refill  . amLODipine (NORVASC) 10 MG tablet Take 1 tablet (10 mg total) by mouth daily. 90 tablet 3  . aspirin EC 325 MG EC tablet Take 1 tablet (325 mg total) by mouth daily. 30 tablet 0  . atorvastatin (LIPITOR) 80 MG tablet Take 1 tablet (80 mg total) by mouth daily. 90 tablet 3  . cilostazol (PLETAL) 100 MG tablet Take 1 tablet (100 mg total) by mouth 2 (two) times daily. 180 tablet 3  . ezetimibe (ZETIA) 10 MG tablet Take 1 tablet (10 mg total) by mouth daily. 90 tablet 3  . fish oil-omega-3 fatty acids 1000 MG capsule Take 1 g by mouth 2 (two) times daily.     . hydrochlorothiazide (MICROZIDE) 12.5 MG capsule Take 1 capsule (12.5 mg total) by mouth  daily. 90 capsule 3  . lisinopril (PRINIVIL,ZESTRIL) 20 MG tablet Take 1 tablet (20 mg total) by mouth daily. 90 tablet 3  . metoprolol tartrate (LOPRESSOR) 25 MG tablet Take 0.5 tablets (12.5 mg total) by mouth 2 (two) times daily. 90 tablet 3  . multivitamin (Ralph Drake) per tablet Take 1 tablet by mouth daily.    [name] omeprazole (PRILOSEC) 20 MG capsule Take 20 mg by mouth daily as needed.     No current facility-administered medications for this visit.    No Known Allergies  Past Medical History  Diagnosis Date  . CAD S/P percutaneous coronary angioplasty 1993    POBA to Cx x 2 occasions; mild to moderate/nonobstructive CAD on preop cath August 2014  . Severe aortic stenosis by prior echocardiogram     per Dr. Rollene Fare 2012; referred for aortic valve replacement  . S/P AVR (aortic valve replacement) 02/10/2013    23 Edwards Magna Ease Pericardial Tissue Valve  . Peripheral vascular disease (Ralph Drake)      H/O R SFA stent, L Iliac Stent 1996;;  Lower Extremity Angiography April 2015: Occluded left SFA reconstituting at end of the canal, heavily calcified. Three-vessel runoff..;; RCA 50% ostial right common iliac, patent SFA stent -> Referred to Dr. Trula Drake [facility].  . Shortness of breath     Chronic  . Hypertension   .  Dyslipidemia, goal LDL below 70   . Olecranon bursitis of left elbow   . Trigger ring finger of left hand   . Osteoarthritis   . Bradycardia   . GERD (gastroesophageal reflux disease)   . Former heavy tobacco smoker      quit in October 2014  . Atrial fibrillation (HCC)     Height '5\' 5"'$  (1.651 m), weight 147 lb 9.6 oz (66.951 kg).    Ralph Drake PharmD CPP [facility]

## 2015-04-07 IMAGING — CR DG CHEST 1V PORT
1 series · 1 of 1 positions shown · non-contrast
Comparison: A 5514 and earlier.

CLINICAL DATA: 78-year-old male status post CABG.

EXAM:
PORTABLE CHEST - 1 VIEW

[AP]
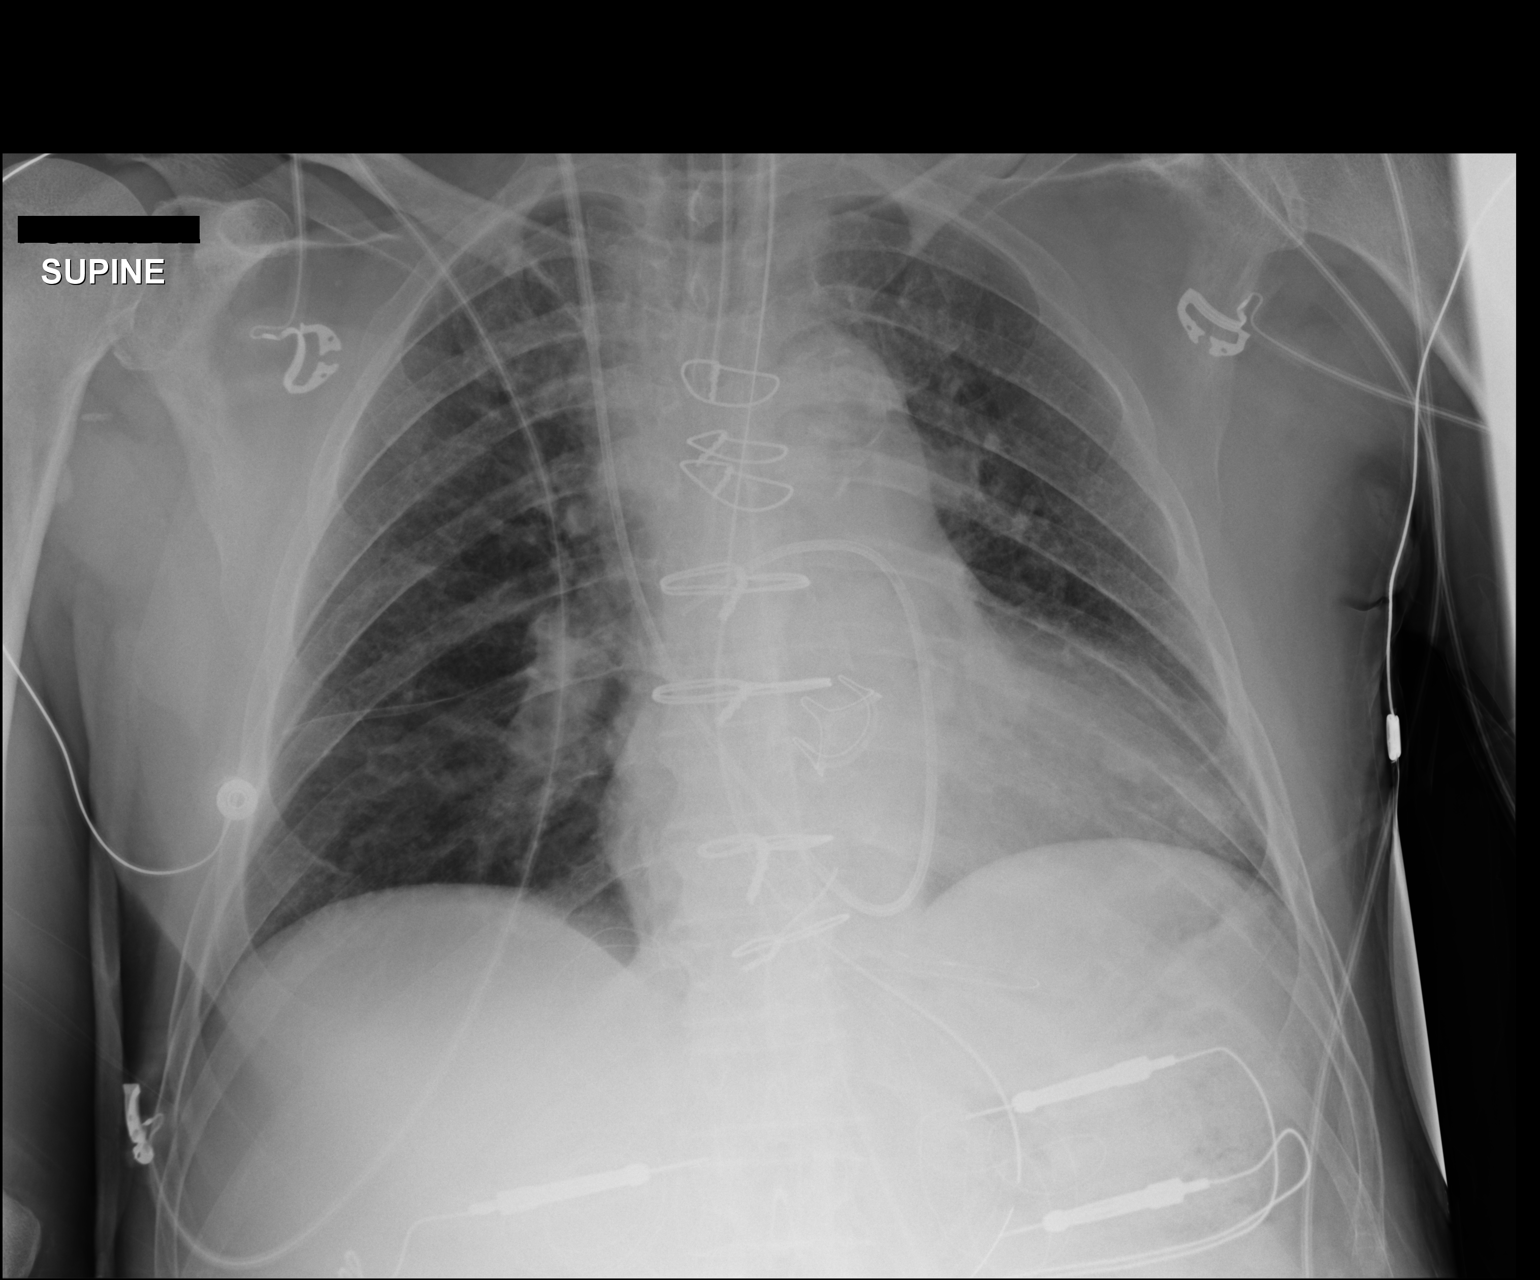

[1 of 1 positions shown; findings below may reference images not displayed]

FINDINGS: Portable spine AP view at 7999 hrs.

Endotracheal tube tip just below the level the clavicles. Enteric
tube courses to the abdomen, tip not included. Right IJ approach
Swan-Ganz catheter, tip at the level of the right main pulmonary
artery. Mediastinal drain. Epicardial pacer wires.

Sequelae of median sternotomy and cardiac valve replacement. No
pneumothorax. No pulmonary edema or pleural effusion. Mild
retrocardiac atelectasis. Stable cardiac size and mediastinal
contours.
IMPRESSION: 1. Lines and tubes appear appropriately placed as above.

2.  Mild atelectasis.

## 2015-04-08 IMAGING — CR DG CHEST 1V PORT
1 series · 1 of 1 positions shown · non-contrast
Comparison: 02/10/2013

CLINICAL DATA: Aortic valve replacement.

PORTABLE CHEST - 1 VIEW

[AP]
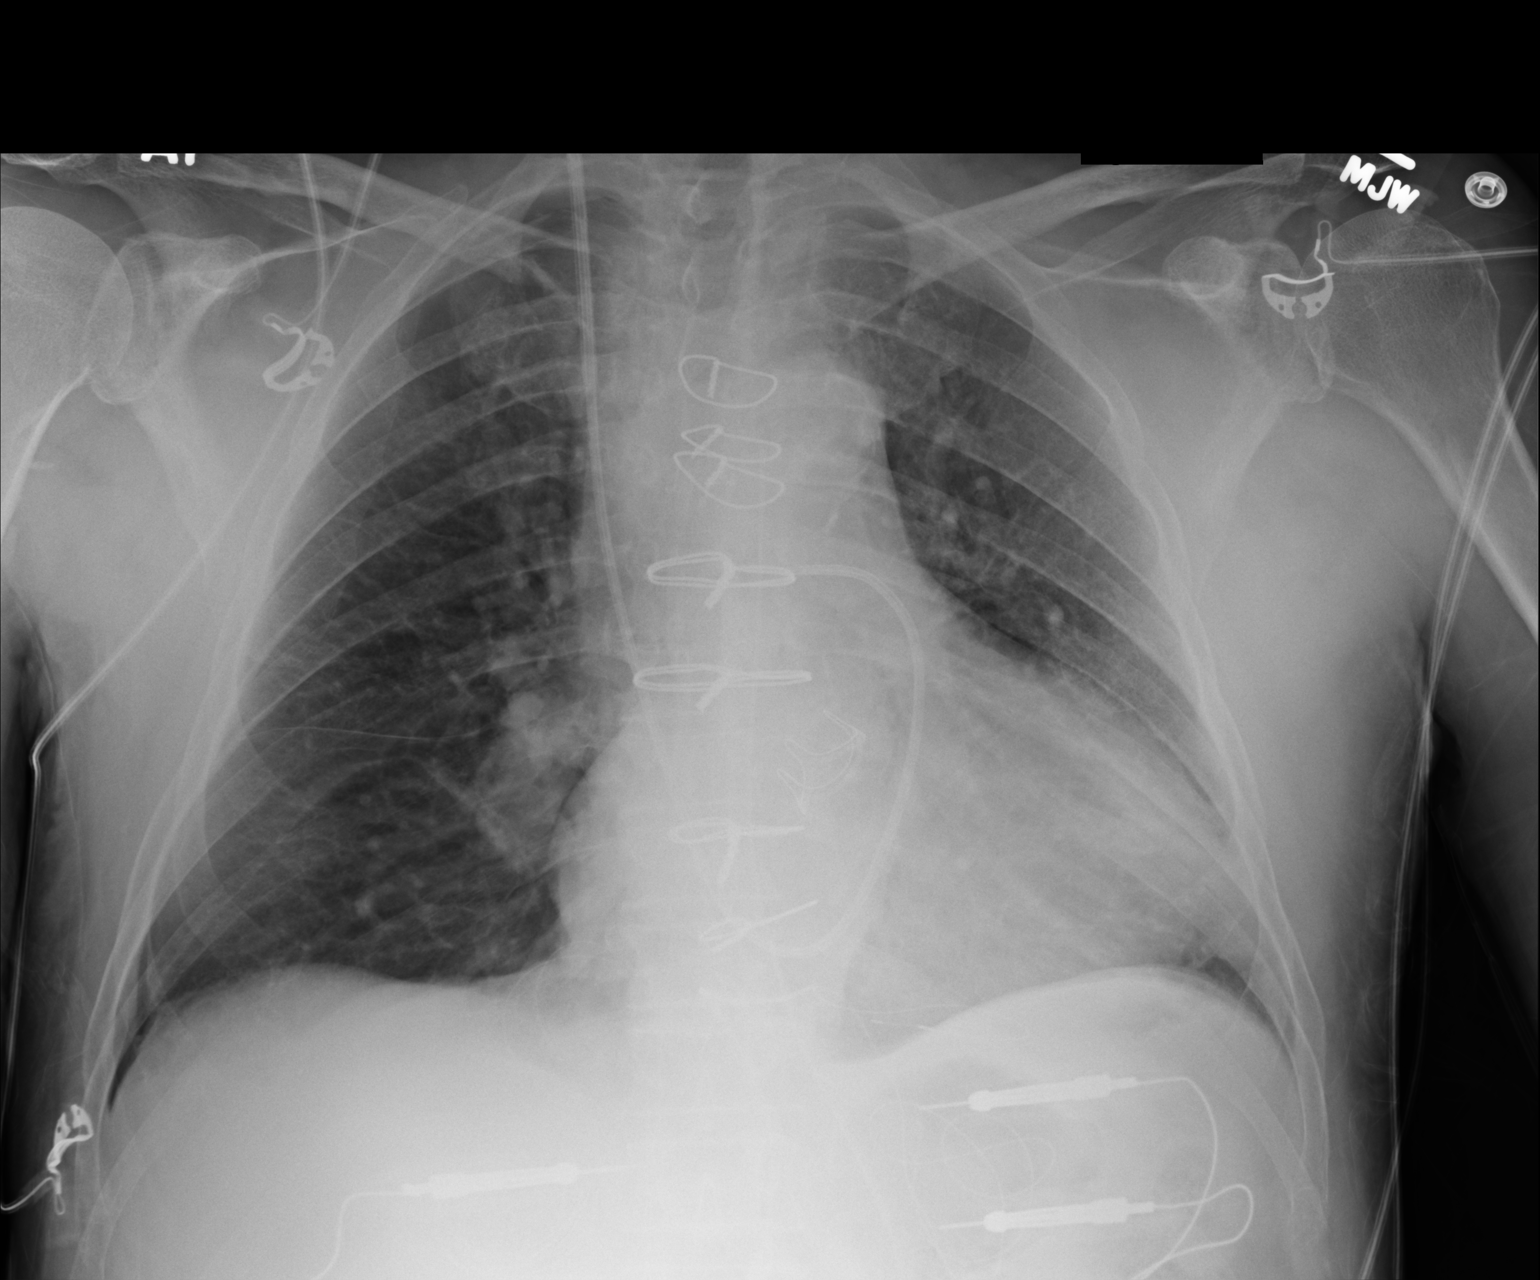

[1 of 1 positions shown; findings below may reference images not displayed]

FINDINGS: Swan-Ganz catheter has been slightly pulled back and it
is in the proximal right pulmonary artery region.  Nasogastric tube
and endotracheal tube have been removed.  Again noted is an aortic
valve replacement.  The lungs are clear without pulmonary edema and
no clear evidence for a pneumothorax.  Heart size is stable.
Trachea is midline. There appears to be a mediastinal drain
present.
IMPRESSION: No acute chest findings.

Support apparatuses as described.  No evidence for a pneumothorax.

## 2015-04-09 IMAGING — CR DG CHEST 1V PORT
1 series · 1 of 1 positions shown · non-contrast
Comparison: 02/11/2013

CLINICAL DATA: Aortic valve replacement.

PORTABLE CHEST - 1 VIEW

[AP]
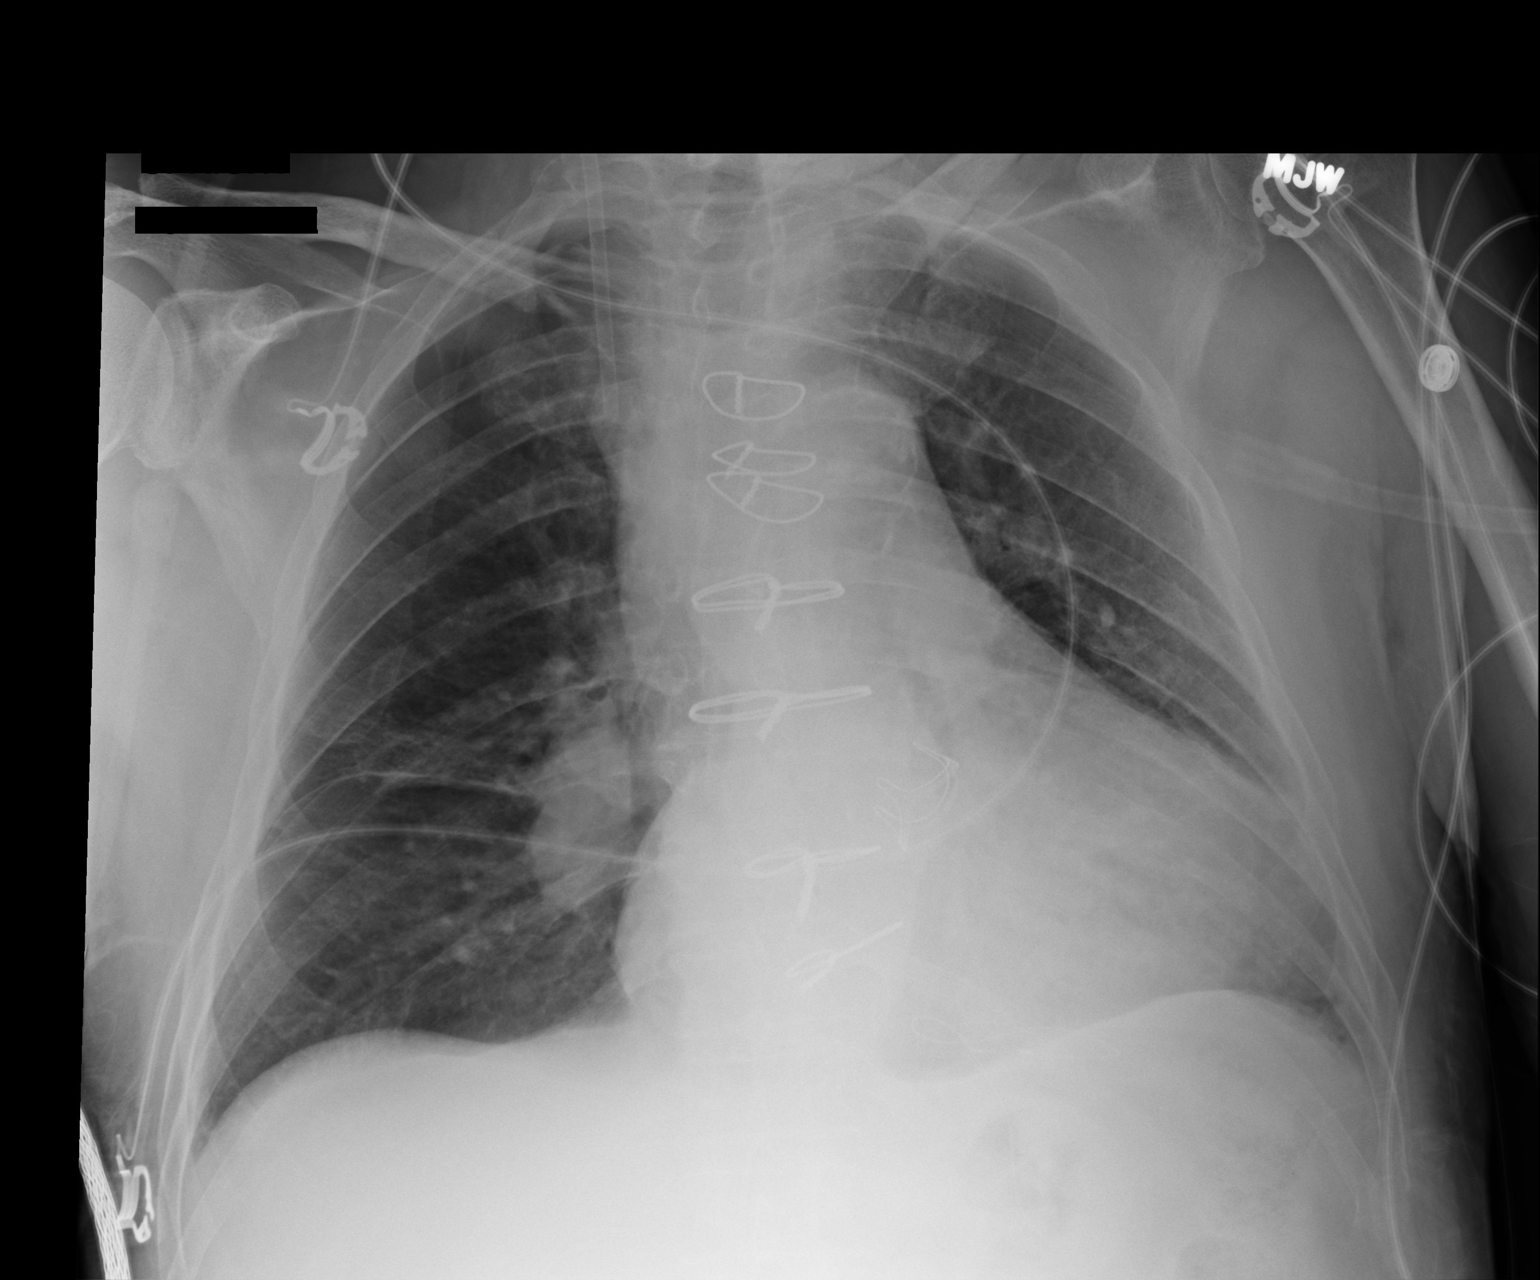

[1 of 1 positions shown; findings below may reference images not displayed]

FINDINGS: Swan-Ganz catheter and chest drain have been removed.
There is a right pneumothorax that roughly measures 15-20% in size.
Right jugular central line is still present.  Densities in the
right infrahilar region are suggestive for vascular structures.
Increased densities in right mid lung may represent atelectasis.
Heart size is upper limits of normal but unchanged.
IMPRESSION: New right pneumothorax measuring 15-20% in size. Findings called to
the patient's nurse, Judian, on 02/12/2013 at [DATE] a.m.

Streaky densities in the right mid lung may represent atelectasis.

Support apparatuses as described.

## 2015-08-10 HISTORY — PX: TRANSTHORACIC ECHOCARDIOGRAM: SHX275

## 2015-08-12 ENCOUNTER — Other Ambulatory Visit: Payer: Self-pay | Admitting: Cardiology

## 2015-08-15 ENCOUNTER — Other Ambulatory Visit: Payer: Self-pay

## 2015-08-15 ENCOUNTER — Ambulatory Visit (HOSPITAL_COMMUNITY): Payer: Medicare Other | Attending: Cardiovascular Disease

## 2015-08-15 DIAGNOSIS — Z87891 Personal history of nicotine dependence: Secondary | ICD-10-CM | POA: Diagnosis not present

## 2015-08-15 DIAGNOSIS — Z9861 Coronary angioplasty status: Secondary | ICD-10-CM | POA: Diagnosis not present

## 2015-08-15 DIAGNOSIS — I2510 Atherosclerotic heart disease of native coronary artery without angina pectoris: Secondary | ICD-10-CM | POA: Diagnosis not present

## 2015-08-15 DIAGNOSIS — R0989 Other specified symptoms and signs involving the circulatory and respiratory systems: Secondary | ICD-10-CM

## 2015-08-15 DIAGNOSIS — E785 Hyperlipidemia, unspecified: Secondary | ICD-10-CM | POA: Diagnosis not present

## 2015-08-15 DIAGNOSIS — I119 Hypertensive heart disease without heart failure: Secondary | ICD-10-CM | POA: Diagnosis not present

## 2015-08-15 DIAGNOSIS — Z953 Presence of xenogenic heart valve: Secondary | ICD-10-CM | POA: Insufficient documentation

## 2015-08-15 DIAGNOSIS — I359 Nonrheumatic aortic valve disorder, unspecified: Secondary | ICD-10-CM | POA: Diagnosis present

## 2015-08-15 DIAGNOSIS — I739 Peripheral vascular disease, unspecified: Secondary | ICD-10-CM | POA: Diagnosis not present

## 2015-08-15 DIAGNOSIS — I10 Essential (primary) hypertension: Secondary | ICD-10-CM | POA: Diagnosis not present

## 2015-08-15 DIAGNOSIS — I340 Nonrheumatic mitral (valve) insufficiency: Secondary | ICD-10-CM | POA: Insufficient documentation

## 2015-08-15 DIAGNOSIS — Z954 Presence of other heart-valve replacement: Secondary | ICD-10-CM

## 2015-08-15 DIAGNOSIS — I071 Rheumatic tricuspid insufficiency: Secondary | ICD-10-CM | POA: Insufficient documentation

## 2015-08-15 DIAGNOSIS — Z952 Presence of prosthetic heart valve: Secondary | ICD-10-CM

## 2015-08-15 DIAGNOSIS — I480 Paroxysmal atrial fibrillation: Secondary | ICD-10-CM | POA: Diagnosis not present

## 2015-08-15 NOTE — Progress Notes (Signed)
Quick Note:  Echo results: Good news: Essentially normal echocardiogram and normal pump function and normal valve function - prosthetic valve looks good.. No signs to suggest heart attack.. EF: 55-60%. No regional wall motion abnormalities  Kendall Justo W, MD  Pls forward to : PCP user Bernerd Limbo, MD    ______

## 2015-08-19 ENCOUNTER — Telehealth: Payer: Self-pay | Admitting: *Deleted

## 2015-08-19 NOTE — Telephone Encounter (Signed)
Spoke to patient. Result given . Verbalized understanding  Patient states he is seeing a new primary -Dr [name] -[facility] ON 08/31/15 PHONE [phone]  ROUTED TO DR [facility]

## 2015-08-19 NOTE — Telephone Encounter (Signed)
-----   Message from Leonie Man, MD sent at 08/15/2015  3:58 PM EST ----- Echo results: Good news: Essentially normal echocardiogram and normal pump function and normal valve function - prosthetic valve looks good..  No signs to suggest heart attack.. EF: 55-60%. No regional wall motion abnormalities  HARDING, DAVID W, MD  Pls forward to :  PCP user Bernerd Limbo, MD

## 2015-09-09 ENCOUNTER — Telehealth: Payer: Self-pay | Admitting: Cardiology

## 2015-09-09 NOTE — Telephone Encounter (Signed)
Patient had several question  1-  Was not called for feb 2017 appointment  2- wanted to know why he was taking HCTZ  3- labs were obtained by new pcp -Dr Kary Kos  RN schedule patient an appointment for 09/29/15  Medication was started after patient had labs in 02/2015 -  RN informed patient to bring labs with him at next appointment( office has not received labs from pcp yet.[name]) Patient verbalized understanding.

## 2015-09-09 NOTE — Telephone Encounter (Signed)
Pt has a few questions to ask nurse-also was due in Feb but he said he never got a reminder-pls call [phone]

## 2015-09-29 ENCOUNTER — Encounter: Payer: Self-pay | Admitting: Cardiology

## 2015-09-29 ENCOUNTER — Ambulatory Visit (INDEPENDENT_AMBULATORY_CARE_PROVIDER_SITE_OTHER): Payer: Medicare Other | Admitting: Cardiology

## 2015-09-29 VITALS — BP 128/74 | HR 52 | Ht 65.0 in | Wt 145.0 lb

## 2015-09-29 DIAGNOSIS — R0609 Other forms of dyspnea: Secondary | ICD-10-CM

## 2015-09-29 DIAGNOSIS — I2510 Atherosclerotic heart disease of native coronary artery without angina pectoris: Secondary | ICD-10-CM

## 2015-09-29 DIAGNOSIS — Z9861 Coronary angioplasty status: Secondary | ICD-10-CM

## 2015-09-29 DIAGNOSIS — I739 Peripheral vascular disease, unspecified: Secondary | ICD-10-CM

## 2015-09-29 DIAGNOSIS — I10 Essential (primary) hypertension: Secondary | ICD-10-CM

## 2015-09-29 DIAGNOSIS — Z954 Presence of other heart-valve replacement: Secondary | ICD-10-CM

## 2015-09-29 DIAGNOSIS — E785 Hyperlipidemia, unspecified: Secondary | ICD-10-CM

## 2015-09-29 DIAGNOSIS — I480 Paroxysmal atrial fibrillation: Secondary | ICD-10-CM | POA: Diagnosis not present

## 2015-09-29 DIAGNOSIS — Z952 Presence of prosthetic heart valve: Secondary | ICD-10-CM

## 2015-09-29 MED ORDER — HYDROCHLOROTHIAZIDE 12.5 MG PO CAPS
12.5000 mg | ORAL_CAPSULE | Freq: Every day | ORAL | Status: DC
Start: 2015-09-29 — End: 2016-08-15

## 2015-09-29 MED ORDER — CILOSTAZOL 100 MG PO TABS: 100.0000 mg | ORAL_TABLET | Freq: Two times a day (BID) | ORAL | Status: DC

## 2015-09-29 MED ORDER — LISINOPRIL 20 MG PO TABS: 20.0000 mg | ORAL_TABLET | Freq: Every day | ORAL | Status: DC

## 2015-09-29 MED ORDER — ATORVASTATIN CALCIUM 80 MG PO TABS: 80.0000 mg | ORAL_TABLET | Freq: Every day | ORAL | Status: DC

## 2015-09-29 MED ORDER — AMLODIPINE BESYLATE 10 MG PO TABS: 10.0000 mg | ORAL_TABLET | Freq: Every day | ORAL | Status: DC

## 2015-09-29 MED ORDER — EZETIMIBE 10 MG PO TABS: 10.0000 mg | ORAL_TABLET | Freq: Every day | ORAL | Status: DC

## 2015-09-29 NOTE — Progress Notes (Signed)
PCP: [name], MD  Clinic Note: Chief Complaint  Patient presents with  . Follow-up    8 Months, pt denied chest pain and SOB  . Coronary Artery Disease    PAD, S/P AVR    HPI: Ralph Drake is a 81 y.o. male with a PMH below who presents today for delayed 6 month f/u for CAD (coronary angioplasty x 2 1993), PAD (SFA stent 1996, SFA occlusion 2015) AVR 2014 (bovine). -- Saw Dr. Trula Slade in Jan 2016.  Plan expectant management.  F/u PRN.  Ralph Drake was last seen in Aug 2016. He was doing relatively well from a cardiac standpoint. He did have some claudication, but not limiting. He is limited more by claudication and he is by dyspnea or chest discomfort however. He was referred to Ralph Drake, RPH-CCP for assistance with lipid management. She saw him in October.  Recent Hospitalizations: None  Studies Reviewed:  -- recheck echo in 2 yrs.  Echo 08/2015 --> EF 50-55%. No RWMA.  GR 1 DD.  23 mm bioprosthetic valve functioning normally.  Mild biatrial dilation.   Lab Results  Component Value Date   CHOL 178 02/07/2015   HDL 51 02/07/2015   LDLCALC 99 02/07/2015   TRIG 141 02/07/2015   CHOLHDL 3.5 02/07/2015    Interval History: Ralph Drake presents today doing quite well. He remains active and walks on a routine basis. He says he walks about 2-2-1/2 miles daily, but will not stop every quarter mile or so to allow his calf Muscles to relax due to his claudication. He may note a little bit of exertional dyspnea, but is usually the claudication that bothersome first. He denies any rapid irregular heartbeats or palpitations. No resting or exertional chest tightness or pressure with level of activity he is doing. No heart failure symptoms of PND orthopnea or edema. No B/near syncope or TIA/amaurosis fugax symptoms. He still has some vertigo type dizziness, no significant dizziness. No orthostatic symptoms.[name]  + mostly calf claudication - after ~1/4 mile  ROS: A  comprehensive was performed. Review of Systems  Constitutional: Negative for weight loss and malaise/fatigue.  HENT: Negative for nosebleeds.   Respiratory: Negative for cough, shortness of breath and wheezing.   Cardiovascular:       Per HPI  Gastrointestinal: Negative for blood in stool and melena.  Genitourinary: Negative for hematuria.  Musculoskeletal: Positive for myalgias. Negative for back pain and joint pain.  Neurological: Negative for headaches.  All other systems reviewed and are negative.   Past Medical History  Diagnosis Date  . CAD S/P percutaneous coronary angioplasty 1993    POBA to Cx x 2 occasions; mild to moderate/nonobstructive CAD on preop cath August 2014  . Severe aortic stenosis by prior echocardiogram     per Dr. Rollene Fare 2012; referred for aortic valve replacement  . S/P AVR (aortic valve replacement) 02/10/2013    23 Edwards Magna Ease Pericardial Tissue Valve  . Peripheral vascular disease ([place])      H/O R SFA stent, L Iliac Stent 1996;;  Lower Extremity Angiography April 2015: Occluded left SFA reconstituting at end of the canal, heavily calcified. Three-vessel runoff..;; RCA 50% ostial right common iliac, patent SFA stent -> Referred to Dr. Trula Slade of [facility].  . Shortness of breath     Chronic  . Hypertension   . Dyslipidemia, goal LDL below 70   . Olecranon bursitis of left elbow   . Trigger ring finger of left hand   .  Osteoarthritis   . Bradycardia   . GERD (gastroesophageal reflux disease)   . Former heavy tobacco smoker      quit in October 2014  . Atrial fibrillation [facility])     Past Surgical History  Procedure Laterality Date  . Tonsillectomy      as child  . Right hand surgery    . Trigger finger release  07/23/2011    Procedure: RELEASE TRIGGER FINGER/A-1 PULLEY;  Surgeon: Lorn Junes, MD;  Location: [place];  Service: Orthopedics;  Laterality: Left;  release trigger left ring finger patient had supraclavicular  block in preop  . Olecranon bursectomy  07/23/2011    Procedure: OLECRANON BURSA;  Surgeon: Lorn Junes, MD;  Location: [place];  Service: Orthopedics;  Laterality: Left;  excision olecranon bursa left elbow patient had supraclaviular block in preop  . Coronary angioplasty  1993    Circumflex x 2   . Rotator cuff repair Right   . Aortic valve replacement  02/10/2013    Dr. Servando Snare: [address] Ease Pericardia  . Iliac artery stent Left 1996  . Superficial femoral atery stent Right 1996  . Aortic valve replacement N/A 02/10/2013    AORTIC VALVE REPLACEMENT (AVR);  Surgeon: Grace Isaac, MD;  Location: [place];  Service: Open Heart Surgery;  Laterality: N/A  . Intraoperative transesophageal echocardiogram N/A 02/10/2013    Procedure: INTRAOPERATIVE TRANSESOPHAGEAL ECHOCARDIOGRAM;  Surgeon: Grace Isaac, MD;  Location: [place];  Service: Open Heart Surgery;  Laterality: N/A;  . Transthoracic echocardiogram  03/23/2013    Post-Op Echo #1: EF 50-55%, Mild Conc LVH, Mild HK of Inferolateral wall with paradoxical septal motion (c/w post-op state); Well seated 23 mm Bioprosthetic Valve (area ~1.23 cm2 - normal for valve); Mild MR  . Cardiac catheterization  August 2014    Nonobstructive 30-50% RCA. No significant LAD disease. 30% mid circumflex.  . Peripheral vascular angiogram  07/19/1994    No intervention-recommend medical therapy  . Peripheral vascular angiogram  01/17/1995    Right SFA 80-90# lesion, dilatation performed with a Cordis "Optz 5" 32m x 2cm, inflated a 4-7.5atm for 40 to 70 seconds. Completion angiogram done by hand with "bolus chase" runoff from Primal SFA to foot. Resutling in reduction of 80-90% to 20% with excellent flow and without dissection  . Arterial doppler - pre-cabg  02/06/2013    Bilateral 1-39% ICA stenosis  . Cardiac catheterization  11/10/2009    No intervention - recommend medical management  . Cardiovascular stress test  06/24/2012    Diffuse  LV hypokinesis with moderately depressed systolic function and EF 357%  . Peripheral vascular angiogram  09/10/2013    Occluded left SFA, heavily calcified with reconstitution at adductor canal. Three-vessel runof.;; Right lower extremity noted 50% ostial right common iliac stenosis with roughly 20 mm gradient. Not considered significant.f  . Left and right heart catheterization with coronary angiogram N/A 01/28/2013    Procedure: LEFT AND RIGHT HEART CATHETERIZATION WITH CORONARY ANGIOGRAM;  Surgeon: DLeonie Man MD;  [facility];  Service: Cardiovascular;  Laterality: N/A;  . Lower extremity angiogram N/A 09/10/2013    Procedure: LOWER EXTREMITY ANGIOGRAM;  Surgeon: JLorretta Harp MD;  [facility];  Service: Cardiovascular;  Laterality: N/A;  . Abdominal angiogram  09/10/2013    Procedure: ABDOMINAL ANGIOGRAM;  Surgeon: JLorretta Harp MD;  [facility];  Service: Cardiovascular;;  . Transthoracic echocardiogram  March 2017  EF 50-55%. No RWMA.  GR 1 DD.  23 mm bioprosthetic valve functioning normally.  Mild biatrial dilation.    Prior to Admission medications   Medication Sig Start Date End Date Taking? Authorizing Provider  amLODipine (NORVASC) 10 MG tablet Take 1 tablet by mouth  daily 08/12/15  Yes Leonie Man, MD  aspirin EC 325 MG EC tablet Take 1 tablet (325 mg total) by mouth daily. 02/17/13  Yes Donielle Liston Alba, PA-C  atorvastatin (LIPITOR) 80 MG tablet Take 1 tablet (80 mg total) by mouth daily. 03/17/15  Yes Leonie Man, MD  cilostazol (PLETAL) 100 MG tablet Take 1 tablet (100 mg total) by mouth 2 (two) times daily. 02/07/15  Yes Leonie Man, MD  ezetimibe (ZETIA) 10 MG tablet Take 1 tablet (10 mg total) by mouth daily. 08/10/14  Yes Leonie Man, MD  fish oil-omega-3 fatty acids 1000 MG capsule Take 1 g by mouth 2 (two) times daily.    Yes Historical Provider, MD  hydrochlorothiazide (MICROZIDE) 12.5 MG capsule Take 1 capsule (12.5 mg  total) by mouth daily. 03/10/15  Yes Leonie Man, MD  lisinopril (PRINIVIL,ZESTRIL) 20 MG tablet Take 1 tablet (20 mg total) by mouth daily. 02/07/15  Yes Leonie Man, MD  metoprolol tartrate (LOPRESSOR) 25 MG tablet Take 0.5 tablets (12.5 mg total) by mouth 2 (two) times daily. 02/07/15  Yes Leonie Man, MD  multivitamin [facility]) per tablet Take 1 tablet by mouth daily.   Yes Historical Provider, MD  omeprazole (PRILOSEC) 20 MG capsule Take 20 mg by mouth daily as needed. 05/27/13  Yes Leonie Man, MD  vitamin E 400 UNIT capsule Take 2 capsules by mouth daily.   Yes Historical Provider, MD   No Known Allergies   Social History   Social History  . Marital Status: Married    Spouse Name: N/A  . Number of Children: N/A  . Years of Education: N/A   Social History Main Topics  . Smoking status: Former Smoker -- 0.50 packs/day for 60 years    Types: Cigarettes    Quit date: 01/31/2013  . Smokeless tobacco: Never Used  . Alcohol Use: No  . Drug Use: No  . Sexual Activity: Not Asked   Other Topics Concern  . None   Social History Narrative   Married with no children. He long-term smoker who quit prior to his aVR in 2014.    Up until his operation he worked part-time at [employer].  He is not yet gone back to work postoperatively.   He chose not be cardiac rehabilitation, but has gone back to walking exercising daily.   Family History  Problem Relation Age of Onset  . Hypertension Mother   . Cancer Mother     Oral  . Stroke Father      Wt Readings from Last 3 Encounters:  09/29/15 145 lb (65.772 kg)  03/18/15 147 lb 9.6 oz (66.951 kg)  02/07/15 146 lb (66.225 kg)    PHYSICAL EXAM BP 128/74 mmHg  Pulse 52  Ht '5\' 5"'$  (1.651 m)  Wt 145 lb (65.772 kg)  BMI 24.13 kg/m2 General appearance: alert and oriented x3, cooperative, appears stated age, no distress and well-nourished/well-groomed. Answers questions appropriately. HEENT: [place]/AT, EOMI, MMM,  anicteric sclera Neck: no adenopathy, no JVD, supple, trachea midline, thyroid not enlarged; faint carotid bruits bilaterally; normal carotid upstroke without delay  Lungs: CTAB, normal percussion bilaterally and nonlabored, good air movement. Just mostly  diminished throughout. No rales or rhonchi. Heart: normal apical impulse, regular rate and rhythm, Dsitant heart sounds - S1: normal, S2: physiologically split, early systolic 1/6 c-d SEM at 2nd right intercostal space, radiates to carotids, no click, no rub and no other murmur. Abdomen: soft, non-tender; bowel sounds normal; no masses, no organomegaly Extremities: extremities normal, atraumatic, no cyanosis or edema Pulses: Significantly diminished lower 70 pulses. Palpable but faint/1+ right DP and PT. Left DP PT are thready to barely palpable. Neurologic: Grossly normal    Adult ECG Report  Rate: 52 ;  Rhythm: sinus bradycardia and 1 AV block. RBBB, LAFB (-67) = 5/trIfascicular block;   Narrative Interpretation: Stable EKG from March 2016.  Other studies Reviewed: Additional studies/ records that were reviewed today include:  Recent Labs:   Lab Results  Component Value Date   CHOL 178 02/07/2015   HDL 51 02/07/2015   LDLCALC 99 02/07/2015   TRIG 141 02/07/2015   CHOLHDL 3.5 02/07/2015  Recent Labs: from PCP 09/02/15  Na+ 137, K+ 5.2, Cl- 104, HCO3- 28 , BUN 30, Cr 1.7, Glu 94, Ca2+ 10.8; AST 24, ALT 18; AlkP 54, Alb 4.6, TP 7.0   TC 164, TG 88, HDL 53, LDL 93   ASSESSMENT / PLAN: Problem List Items Addressed This Visit    S/P tissue AVR 02/11/13 (Chronic)    Follow-up echocardiogram in March showed relatively stable valve normal function. Would probably be okay to wait 2 years to follow back up again with echocardiogram.      Peripheral vascular disease - s/p L Iliac stent (Chronic)   Relevant Medications   amLODipine (NORVASC) 10 MG tablet   ezetimibe (ZETIA) 10 MG tablet   hydrochlorothiazide (MICROZIDE) 12.5 MG  capsule   atorvastatin (LIPITOR) 80 MG tablet   lisinopril (PRINIVIL,ZESTRIL) 20 MG tablet   PAF (paroxysmal atrial fibrillation), post op    No evidence of recurrence. On rate control beta blocker. Not on endocrine relation beyond aspirin unless there is a documented recurrence.      Relevant Medications   amLODipine (NORVASC) 10 MG tablet   ezetimibe (ZETIA) 10 MG tablet   hydrochlorothiazide (MICROZIDE) 12.5 MG capsule   atorvastatin (LIPITOR) 80 MG tablet   lisinopril (PRINIVIL,ZESTRIL) 20 MG tablet   Moderate claudication (HCC) (Chronic)   Exertional dyspnea (Chronic)   Essential hypertension (Chronic)    Fairly stable blood pressure with current regimen. For now no change. Continue current low-dose HCTZ.      Relevant Medications   amLODipine (NORVASC) 10 MG tablet   ezetimibe (ZETIA) 10 MG tablet   hydrochlorothiazide (MICROZIDE) 12.5 MG capsule   atorvastatin (LIPITOR) 80 MG tablet   lisinopril (PRINIVIL,ZESTRIL) 20 MG tablet   Dyslipidemia, goal LDL below 70 (Chronic)    Still not quite at goal. Continues to be on atorvastatin 80 and Zetia. I would like her to return to [facility], a [place]. His lipids and blood pressure can be monitored in this manner in the interim before see him back in one year.      Relevant Medications   amLODipine (NORVASC) 10 MG tablet   ezetimibe (ZETIA) 10 MG tablet   hydrochlorothiazide (MICROZIDE) 12.5 MG capsule   atorvastatin (LIPITOR) 80 MG tablet   lisinopril (PRINIVIL,ZESTRIL) 20 MG tablet   CAD S/P PTCA of CFX X 2 in '93 (Chronic)    He has been relatively stable since his initial intermittent intervention back and 93. No real angina symptoms since then. He is more limited by claudication  and angina. Continues to be on a stable dose of beta blocker, ACE inhibitor and calcium channel blocker.  He continues to be on Zetia plus atorvastatin and high-dose. He remains on stable as of aspirin.      Relevant Medications   amLODipine  (NORVASC) 10 MG tablet   ezetimibe (ZETIA) 10 MG tablet   hydrochlorothiazide (MICROZIDE) 12.5 MG capsule   atorvastatin (LIPITOR) 80 MG tablet   lisinopril (PRINIVIL,ZESTRIL) 20 MG tablet   Bilateral claudication of lower limb (HCC) (Chronic)    Stable claudication that seems to limit his activity, but still it is not keeping him from doing things. He is being followed by vascular surgery. Plans for now to continue with current regimen including Pletal and aspirin addition to other medications listed for CAD       Other Visit Diagnoses    Hyperlipidemia    -  Primary    Relevant Medications    amLODipine (NORVASC) 10 MG tablet    ezetimibe (ZETIA) 10 MG tablet    hydrochlorothiazide (MICROZIDE) 12.5 MG capsule    atorvastatin (LIPITOR) 80 MG tablet    lisinopril (PRINIVIL,ZESTRIL) 20 MG tablet    Other Relevant Orders    EKG 12-Lead       Current medicines are reviewed at length with the patient today. (+/- concerns) None The following changes have been made: No changes Studies Ordered:   Orders Placed This Encounter  Procedures  . EKG 12-Lead   Follow-up in one year. Likely at the [facility] office   HARDING, Leonie Green, M.D., M.S. Interventional Cardiologist   Pager # [phone] Phone # [phone] [address] [place], [place] [address]

## 2015-09-29 NOTE — Patient Instructions (Signed)
Your physician wants you to follow-up in: 1 Year in [place] [employer] [place] will receive a reminder letter in the mail two months in advance. If you don't receive a letter, please call our office to schedule the follow-up appointment.

## 2015-10-01 ENCOUNTER — Encounter: Payer: Self-pay | Admitting: Cardiology

## 2015-10-01 DIAGNOSIS — I739 Peripheral vascular disease, unspecified: Secondary | ICD-10-CM | POA: Insufficient documentation

## 2015-10-01 NOTE — Assessment & Plan Note (Signed)
Stable claudication that seems to limit his activity, but still it is not keeping him from doing things. He is being followed by vascular surgery. Plans for now to continue with current regimen including Pletal and aspirin addition to other medications listed for CAD

## 2015-10-01 NOTE — Assessment & Plan Note (Signed)
Still not quite at goal. Continues to be on atorvastatin 80 and Zetia. I would like her to return to [facility], [facility]. His lipids and blood pressure can be monitored in this manner in the interim before see him back in one year.

## 2015-10-01 NOTE — Assessment & Plan Note (Addendum)
Fairly stable blood pressure with current regimen. For now no change. Continue current low-dose HCTZ.

## 2015-10-01 NOTE — Assessment & Plan Note (Signed)
No evidence of recurrence. On rate control beta blocker. Not on endocrine relation beyond aspirin unless there is a documented recurrence.

## 2015-10-01 NOTE — Assessment & Plan Note (Signed)
He has been relatively stable since his initial intermittent intervention back and 93. No real angina symptoms since then. He is more limited by claudication and angina. Continues to be on a stable dose of beta blocker, ACE inhibitor and calcium channel blocker.  He continues to be on Zetia plus atorvastatin and high-dose. He remains on stable as of aspirin.

## 2015-10-01 NOTE — Assessment & Plan Note (Signed)
Follow-up echocardiogram in March showed relatively stable valve normal function. Would probably be okay to wait 2 years to follow back up again with echocardiogram.

## 2015-10-05 ENCOUNTER — Encounter: Payer: Self-pay | Admitting: Internal Medicine

## 2015-11-03 ENCOUNTER — Encounter: Payer: Self-pay | Admitting: *Deleted

## 2015-11-04 ENCOUNTER — Telehealth: Payer: Self-pay | Admitting: *Deleted

## 2015-11-04 ENCOUNTER — Encounter: Payer: Self-pay | Admitting: Internal Medicine

## 2015-11-04 ENCOUNTER — Ambulatory Visit (INDEPENDENT_AMBULATORY_CARE_PROVIDER_SITE_OTHER): Payer: Medicare Other | Admitting: Internal Medicine

## 2015-11-04 VITALS — BP 140/60 | HR 76 | Wt 145.0 lb

## 2015-11-04 DIAGNOSIS — Z9861 Coronary angioplasty status: Secondary | ICD-10-CM | POA: Diagnosis not present

## 2015-11-04 DIAGNOSIS — Z86010 Personal history of colonic polyps: Secondary | ICD-10-CM

## 2015-11-04 DIAGNOSIS — Z7902 Long term (current) use of antithrombotics/antiplatelets: Secondary | ICD-10-CM

## 2015-11-04 DIAGNOSIS — I2510 Atherosclerotic heart disease of native coronary artery without angina pectoris: Secondary | ICD-10-CM | POA: Diagnosis not present

## 2015-11-04 DIAGNOSIS — K219 Gastro-esophageal reflux disease without esophagitis: Secondary | ICD-10-CM | POA: Diagnosis not present

## 2015-11-04 MED ORDER — NA SULFATE-K SULFATE-MG SULF 17.5-3.13-1.6 GM/177ML PO SOLN
ORAL | Status: DC
Start: 2015-11-04 — End: 2015-11-08

## 2015-11-04 NOTE — Progress Notes (Signed)
Patient ID: Ralph Drake, male   DOB: 20-Aug-1934, 81 y.o.   MRN: [id number] HPI: Ralph Drake is a 81 year old male with a past medical history of adenomatous colon polyps, GERD, CAD, aortic stenosis status post aortic valve replacement, peripheral vascular disease, hypertension, dyslipidemia who is seen to discuss or rectal cancer surveillance. He is here with his wife. He reports that he is feeling well today. He is interested in colorectal cancer surveillance. Dr. Buel Ream note suggested a family history of colorectal cancer but he specifically denies this today. Bowel habits a been regular without blood or melena. He denies diarrhea or constipation. No abdominal pain. Good appetite. No nausea or vomiting. Denies chest pain and dyspnea. He does have a history of GERD and was taken omeprazole 20 mg daily. He stopped this after hearing about possibility of renal dysfunction. He is using mustard on occasion for heartburn. He reports this works well. Overall his heartburn is infrequent. He denies dysphagia and odynophagia.  Past Medical History  Diagnosis Date  . CAD S/P percutaneous coronary angioplasty 1993    POBA to Cx x 2 occasions; mild to moderate/nonobstructive CAD on preop cath August 2014  . Severe aortic stenosis by prior echocardiogram     per Dr. Rollene Fare 2012; referred for aortic valve replacement  . S/P AVR (aortic valve replacement) 02/10/2013    23 [address] Magna Ease Pericardial Tissue Valve  . Peripheral vascular disease ([place])      H/O R SFA stent, L Iliac Stent 1996;;  Lower Extremity Angiography April 2015: Occluded left SFA reconstituting at end of the canal, heavily calcified. Three-vessel runoff..;; RCA 50% ostial right common iliac, patent SFA stent -> Referred to Dr. Trula Slade [facility].  . Shortness of breath     Chronic  . Hypertension   . Dyslipidemia, goal LDL below 70   . Olecranon bursitis of left elbow   . Trigger ring finger of left hand   . Osteoarthritis   .  Bradycardia   . GERD (gastroesophageal reflux disease)   . Former heavy tobacco smoker      quit in October 2014  . Atrial fibrillation ([place])   . Tubular adenoma of colon     Past Surgical History  Procedure Laterality Date  . Tonsillectomy      as child  . Right hand surgery    . Trigger finger release  07/23/2011    Procedure: RELEASE TRIGGER FINGER/A-1 PULLEY;  Surgeon: Lorn Junes, MD;  Location: [place];  Service: Orthopedics;  Laterality: Left;  release trigger left ring finger patient had supraclavicular block in preop  . Olecranon bursectomy  07/23/2011    Procedure: OLECRANON BURSA;  Surgeon: Lorn Junes, MD;  Location: [place];  Service: Orthopedics;  Laterality: Left;  excision olecranon bursa left elbow patient had supraclaviular block in preop  . Coronary angioplasty  1993    Circumflex x 2   . Rotator cuff repair Right   . Aortic valve replacement  02/10/2013    Dr. Servando Snare: [address] Ease Pericardia  . Iliac artery stent Left 1996  . Superficial femoral atery stent Right 1996  . Aortic valve replacement N/A 02/10/2013    AORTIC VALVE REPLACEMENT (AVR);  Surgeon: Grace Isaac, MD;  Location: [place];  Service: Open Heart Surgery;  Laterality: N/A  . Intraoperative transesophageal echocardiogram N/A 02/10/2013    Procedure: INTRAOPERATIVE TRANSESOPHAGEAL ECHOCARDIOGRAM;  Surgeon: Grace Isaac, MD;  Location: [place];  Service: Open Heart Surgery;  Laterality: N/A;  . Transthoracic echocardiogram  03/23/2013    Post-Op Echo #1: EF 50-55%, Mild Conc LVH, Mild HK of Inferolateral wall with paradoxical septal motion (c/w post-op state); Well seated 23 mm Bioprosthetic Valve (area ~1.23 cm2 - normal for valve); Mild MR  . Cardiac catheterization  August 2014    Nonobstructive 30-50% RCA. No significant LAD disease. 30% mid circumflex.  . Peripheral vascular angiogram  07/19/1994    No intervention-recommend medical therapy  .  Peripheral vascular angiogram  01/17/1995    Right SFA 80-90# lesion, dilatation performed with a Cordis "Optz 5" 75m x 2cm, inflated a 4-7.5atm for 40 to 70 seconds. Completion angiogram done by hand with "bolus chase" runoff from Primal SFA to foot. [place]utling in reduction of 80-90% to 20% with excellent flow and without dissection  . Arterial doppler - pre-cabg  02/06/2013    Bilateral 1-39% ICA stenosis  . Cardiac catheterization  11/10/2009    No intervention - recommend medical management  . Cardiovascular stress test  06/24/2012    Diffuse LV hypokinesis with moderately depressed systolic function and EF 370%  . Peripheral vascular angiogram  09/10/2013    Occluded left SFA, heavily calcified with reconstitution at adductor canal. Three-vessel runof.;; Right lower extremity noted 50% ostial right common iliac stenosis with roughly 20 mm gradient. Not considered significant.f  . Left and right heart catheterization with coronary angiogram N/A 01/28/2013    Procedure: LEFT AND RIGHT HEART CATHETERIZATION WITH CORONARY ANGIOGRAM;  Surgeon: DLeonie Man MD;  [facility];  Service: Cardiovascular;  Laterality: N/A;  . Lower extremity angiogram N/A 09/10/2013    Procedure: LOWER EXTREMITY ANGIOGRAM;  Surgeon: JLorretta Harp MD;  [facility];  Service: Cardiovascular;  Laterality: N/A;  . Abdominal angiogram  09/10/2013    Procedure: ABDOMINAL ANGIOGRAM;  Surgeon: JLorretta Harp MD;  [facility];  Service: Cardiovascular;;  . Transthoracic echocardiogram  March 2017    EF 50-55%. No RWMA.  GR 1 DD.  23 mm bioprosthetic valve functioning normally.  Mild biatrial dilation.    Outpatient Prescriptions Prior to Visit  Medication Sig Dispense Refill  . amLODipine (NORVASC) 10 MG tablet Take 1 tablet (10 mg total) by mouth daily. 90 tablet 3  . aspirin EC 325 MG EC tablet Take 1 tablet (325 mg total) by mouth daily. 30 tablet 0  . atorvastatin (LIPITOR) 80 MG tablet Take  1 tablet (80 mg total) by mouth daily. 90 tablet 3  . cilostazol (PLETAL) 100 MG tablet Take 1 tablet (100 mg total) by mouth 2 (two) times daily. 180 tablet 3  . ezetimibe (ZETIA) 10 MG tablet Take 1 tablet (10 mg total) by mouth daily. 90 tablet 3  . fish oil-omega-3 fatty acids 1000 MG capsule Take 1 g by mouth 2 (two) times daily.     . hydrochlorothiazide (MICROZIDE) 12.5 MG capsule Take 1 capsule (12.5 mg total) by mouth daily. 90 capsule 3  . lisinopril (PRINIVIL,ZESTRIL) 20 MG tablet Take 1 tablet (20 mg total) by mouth daily. 90 tablet 3  . metoprolol tartrate (LOPRESSOR) 25 MG tablet Take 0.5 tablets (12.5 mg total) by mouth 2 (two) times daily. 90 tablet 3  . multivitamin (THERAGRAN) per tablet Take 1 tablet by mouth daily.    .Marland Kitchenomeprazole (PRILOSEC) 20 MG capsule Take 20 mg by mouth daily as needed.    . vitamin E 400 UNIT capsule Take 2 capsules by  mouth daily.     No facility-administered medications prior to visit.    No Known Allergies  Family History  Problem Relation Age of Onset  . Hypertension Mother   . Cancer Mother     Oral  . Stroke Father     Social History  Substance Use Topics  . Smoking status: Former Smoker -- 0.50 packs/day for 60 years    Types: Cigarettes    Quit date: 01/31/2013  . Smokeless tobacco: Never Used  . Alcohol Use: No    ROS: As per history of present illness, otherwise negative  BP 140/60 mmHg  Pulse 76  Wt 145 lb (65.772 kg) Constitutional: Well-developed and well-nourished. No distress. HEENT: Normocephalic and atraumatic. Oropharynx is clear and moist. No oropharyngeal exudate. Conjunctivae are normal.  No scleral icterus. Neck: Neck supple. Trachea midline. Cardiovascular: Normal rate, regular rhythm and intact distal pulses. Mechanical S2  Pulmonary/chest: Effort normal and breath sounds normal. No wheezing, rales or rhonchi. Abdominal: Soft, nontender, nondistended. Bowel sounds active throughout. There are no masses  palpable.  Extremities: no clubbing, cyanosis, or edema Lymphadenopathy: No cervical adenopathy noted. Neurological: Alert and oriented to person place and time. Skin: Skin is warm and dry. No rashes noted. Psychiatric: Normal mood and affect. Behavior is normal.  RELEVANT LABS AND IMAGING: CBC    Component Value Date/Time   WBC 5.8 09/02/2013 0943   RBC 4.61 09/02/2013 0943   HGB 13.6 09/02/2013 0943   HCT 39.9 09/02/2013 0943   PLT 213 09/02/2013 0943   MCV 86.6 09/02/2013 0943   MCH 29.5 09/02/2013 0943   MCHC 34.1 09/02/2013 0943   RDW 16.8* 09/02/2013 0943   LYMPHSABS 2.3 03/04/2013 0934   MONOABS 0.5 03/04/2013 0934   EOSABS 0.3 03/04/2013 0934   BASOSABS 0.1 03/04/2013 0934    CMP     Component Value Date/Time   NA 134* 03/07/2015 0945   K 5.0 03/07/2015 0945   CL 100 03/07/2015 0945   CO2 26 03/07/2015 0945   GLUCOSE 95 03/07/2015 0945   BUN 27* 03/07/2015 0945   CREATININE 1.53* 03/07/2015 0945   CREATININE 1.08 02/15/2013 0520   CALCIUM 10.6* 03/07/2015 0945   PROT 6.9 02/07/2015 0916   ALBUMIN 4.5 02/07/2015 0916   AST 22 02/07/2015 0916   ALT 22 02/07/2015 0916   ALKPHOS 60 02/07/2015 0916   BILITOT 0.4 02/07/2015 0916   GFRNONAA 65* 02/15/2013 0520   GFRAA 75* 02/15/2013 0520    ASSESSMENT/PLAN: 81 year old male with a past medical history of adenomatous colon polyps, GERD, CAD, aortic stenosis status post aortic valve replacement, peripheral vascular disease, hypertension, dyslipidemia who is seen to discuss or rectal cancer surveillance.  1. History of adenomatous colon polyps/elevated risk colorectal cancer surveillance -- overall he is doing well clinically and I feel he would be appropriate for repeat colonoscopy. He is in full agreement and wishes to proceed. We did discuss the risks, benefits and alternatives, including discontinuation of screening. I recommended that he stop his Pletal 5 days before and we will seek permission from Dr. [name],  his cardiologist prior to stopping this medication.  2. GERD -- intermittent and mild. I recommended ranitidine 150 twice a day on an as-needed basis. This can be purchased over-the-counter    NL:[id number] Hedrick, Md [facility] [place], [place] [id number]

## 2015-11-04 NOTE — Telephone Encounter (Signed)
11/04/2015  RE: KESHUN BERRETT DOB: December 04, 1934 MRN: [id number]  Dear Dr [name],   We have scheduled the above patient for an endoscopic procedure. Our records show that he is on anticoagulation therapy.  Please advise as to whether the patient may come off his therapy of Pletal 5 days prior to the procedure, which is scheduled for 12/06/15. Please route your response to Dixon Boos, [place].   Sincerely,  Dixon Boos

## 2015-11-04 NOTE — Patient Instructions (Signed)
You have been scheduled for a colonoscopy. Please follow written instructions given to you at your visit today.  Please pick up your prep supplies at the pharmacy within the next 1-3 days. If you use inhalers (even only as needed), please bring them with you on the day of your procedure. Your physician has requested that you go to www.startemmi.com and enter the access code given to you at your visit today. This web site gives a general overview about your procedure. However, you should still follow specific instructions given to you by our office regarding your preparation for the procedure.  You will be contacted by our office prior to your procedure for directions on holding your Pletal.  If you do not hear from our office 1 week prior to your scheduled procedure, please call [phone] to discuss.   If you are age 27 or older, your body mass index should be between 23-30. Your Body mass index is 24.13 kg/(m^2). If this is out of the aforementioned range listed, please consider follow up with your Primary Care Provider.  If you are age 55 or younger, your body mass index should be between 19-25. Your Body mass index is 24.13 kg/(m^2). If this is out of the aformentioned range listed, please consider follow up with your Primary Care Provider.

## 2015-11-07 ENCOUNTER — Emergency Department (HOSPITAL_COMMUNITY): Payer: Medicare Other

## 2015-11-07 ENCOUNTER — Encounter (HOSPITAL_COMMUNITY): Payer: Self-pay | Admitting: Emergency Medicine

## 2015-11-07 ENCOUNTER — Inpatient Hospital Stay (HOSPITAL_COMMUNITY)
Admission: EM | Admit: 2015-11-07 | Discharge: 2015-11-11 | DRG: 225 | Disposition: A | Discharge status: Home / Self Care | Payer: Medicare Other | Attending: Internal Medicine | Admitting: Internal Medicine

## 2015-11-07 DIAGNOSIS — E785 Hyperlipidemia, unspecified: Secondary | ICD-10-CM | POA: Diagnosis present

## 2015-11-07 DIAGNOSIS — I472 Ventricular tachycardia, unspecified: Secondary | ICD-10-CM

## 2015-11-07 DIAGNOSIS — I2510 Atherosclerotic heart disease of native coronary artery without angina pectoris: Secondary | ICD-10-CM | POA: Diagnosis present

## 2015-11-07 DIAGNOSIS — Z7982 Long term (current) use of aspirin: Secondary | ICD-10-CM

## 2015-11-07 DIAGNOSIS — N183 Chronic kidney disease, stage 3 (moderate): Secondary | ICD-10-CM | POA: Diagnosis present

## 2015-11-07 DIAGNOSIS — Z87891 Personal history of nicotine dependence: Secondary | ICD-10-CM

## 2015-11-07 DIAGNOSIS — Z95810 Presence of automatic (implantable) cardiac defibrillator: Secondary | ICD-10-CM

## 2015-11-07 DIAGNOSIS — I248 Other forms of acute ischemic heart disease: Secondary | ICD-10-CM | POA: Diagnosis present

## 2015-11-07 DIAGNOSIS — I129 Hypertensive chronic kidney disease with stage 1 through stage 4 chronic kidney disease, or unspecified chronic kidney disease: Secondary | ICD-10-CM | POA: Diagnosis present

## 2015-11-07 DIAGNOSIS — K219 Gastro-esophageal reflux disease without esophagitis: Secondary | ICD-10-CM | POA: Diagnosis present

## 2015-11-07 DIAGNOSIS — Z952 Presence of prosthetic heart valve: Secondary | ICD-10-CM

## 2015-11-07 DIAGNOSIS — Z79899 Other long term (current) drug therapy: Secondary | ICD-10-CM

## 2015-11-07 DIAGNOSIS — Z8249 Family history of ischemic heart disease and other diseases of the circulatory system: Secondary | ICD-10-CM

## 2015-11-07 DIAGNOSIS — I480 Paroxysmal atrial fibrillation: Secondary | ICD-10-CM | POA: Diagnosis present

## 2015-11-07 DIAGNOSIS — I739 Peripheral vascular disease, unspecified: Secondary | ICD-10-CM | POA: Diagnosis present

## 2015-11-07 MED ORDER — AMIODARONE HCL IN DEXTROSE 360-4.14 MG/200ML-% IV SOLN
60.0000 mg/h | Freq: Once | INTRAVENOUS | Status: AC
  Administered 2015-11-07: 60 mg/h via INTRAVENOUS
  Filled 2015-11-07: qty 200

## 2015-11-07 MED ORDER — SODIUM CHLORIDE 0.9 % IV BOLUS (SEPSIS)
500.0000 mL | Freq: Once | INTRAVENOUS | Status: AC
  Administered 2015-11-07: 500 mL via INTRAVENOUS

## 2015-11-07 NOTE — ED Notes (Signed)
MD at bedside. 

## 2015-11-07 NOTE — Progress Notes (Signed)
ANTICOAGULATION CONSULT NOTE - Initial Consult  Pharmacy Consult for Heparin Indication: VTach  No Known Allergies  Patient Measurements:    Vital Signs: Temp: 97.9 F (36.6 C) (05/29 2335) BP: 99/78 mmHg (05/30 0045) Pulse Rate: 82 (05/30 0045)  Labs:  Recent Labs  11/08/15 0011 11/08/15 0031  HGB 11.7* 11.9*  HCT 36.4* 35.0*  PLT 175  --   LABPROT 13.4  --   INR 1.00  --   CREATININE  --  1.70*    Estimated Creatinine Clearance: 31.2 mL/min (by C-G formula based on Cr of 1.7).   Medical History: Past Medical History  Diagnosis Date  . CAD S/P percutaneous coronary angioplasty 1993    POBA to Cx x 2 occasions; mild to moderate/nonobstructive CAD on preop cath August 2014  . Severe aortic stenosis by prior echocardiogram     per Dr. Rollene Fare 2012; referred for aortic valve replacement  . S/P AVR (aortic valve replacement) 02/10/2013    23 Edwards Magna Ease Pericardial Tissue Valve  . Peripheral vascular disease ([place])      H/O R SFA stent, L Iliac Stent 1996;;  Lower Extremity Angiography April 2015: Occluded left SFA reconstituting at end of the canal, heavily calcified. Three-vessel runoff..;; RCA 50% ostial right common iliac, patent SFA stent -> Referred to Dr. Trula Slade of [facility].  . Shortness of breath     Chronic  . Hypertension   . Dyslipidemia, goal LDL below 70   . Olecranon bursitis of left elbow   . Trigger ring finger of left hand   . Osteoarthritis   . Bradycardia   . GERD (gastroesophageal reflux disease)   . Former heavy tobacco smoker      quit in October 2014  . Atrial fibrillation ([place])   . Tubular adenoma of colon     Medications:  No current facility-administered medications on file prior to encounter.   Current Outpatient Prescriptions on File Prior to Encounter  Medication Sig Dispense Refill  . amLODipine (NORVASC) 10 MG tablet Take 1 tablet (10 mg total) by mouth daily. 90 tablet 3  . aspirin EC 325 MG EC tablet Take 1 tablet  (325 mg total) by mouth daily. 30 tablet 0  . atorvastatin (LIPITOR) 80 MG tablet Take 1 tablet (80 mg total) by mouth daily. 90 tablet 3  . cilostazol (PLETAL) 100 MG tablet Take 1 tablet (100 mg total) by mouth 2 (two) times daily. 180 tablet 3  . ezetimibe (ZETIA) 10 MG tablet Take 1 tablet (10 mg total) by mouth daily. 90 tablet 3  . fish oil-omega-3 fatty acids 1000 MG capsule Take 1 g by mouth 2 (two) times daily.     . hydrochlorothiazide (MICROZIDE) 12.5 MG capsule Take 1 capsule (12.5 mg total) by mouth daily. 90 capsule 3  . lisinopril (PRINIVIL,ZESTRIL) 20 MG tablet Take 1 tablet (20 mg total) by mouth daily. 90 tablet 3  . metoprolol tartrate (LOPRESSOR) 25 MG tablet Take 0.5 tablets (12.5 mg total) by mouth 2 (two) times daily. 90 tablet 3  . multivitamin (THERAGRAN) per tablet Take 1 tablet by mouth daily.    . Na Sulfate-K Sulfate-Mg Sulf 17.5-3.13-1.6 GM/180ML SOLN Suprep-Use as directed 354 mL 0  . omeprazole (PRILOSEC) 20 MG capsule Take 20 mg by mouth daily as needed.    . vitamin E 400 UNIT capsule Take 2 capsules by mouth daily.       Assessment: 81 y.o. male with h/o AFib, found by EMS to be  in [place], now converted to NSR s/p Amiodarone 150 mg IV X 2, for heparin  Goal of Therapy:  Heparin level 0.3-0.7 units/ml Monitor platelets by anticoagulation protocol: Yes   Plan:  Heparin 3000 units IV bolus, then start heparin 1000 units/hr Check heparin level in 8 hours.    Kelsen Celona, Bronson Curb 11/08/2015,12:52 AM

## 2015-11-07 NOTE — ED Provider Notes (Signed)
CSN: [id number]     Arrival date & time 11/07/15  2326 History  By signing my name below, I, Georgette Shell, attest that this documentation has been prepared under the direction and in the presence of Deadrian Toya, MD. Electronically Signed: Georgette Shell, ED Scribe. 11/07/2015. 11:40 PM.   Chief Complaint  Patient presents with  . Irregular Heart Beat   Patient is a 81 y.o. male presenting with palpitations. The history is provided by the patient and the EMS personnel. No language interpreter was used.  Palpitations Palpitations quality:  Fast Onset quality:  Sudden Timing:  Constant Progression:  Partially resolved Chronicity:  New Context: not anxiety   Relieved by:  Nothing Worsened by:  Nothing Ineffective treatments:  None tried Associated symptoms: chest pain   Associated symptoms: no back pain, no cough, no lower extremity edema and no shortness of breath     HPI Comments: Ralph Drake is a 81 y.o. male who has a PMHx of CAD, sever aortic stenosis, PVD, HTN, DLD, GERD and bradycardia presents to the Emergency Department via EMS complaining of sudden onset, gradual worsening, palpitations onset 3.5 hours ago. He notes associated shortness of breath, diaphoresis, and nausea. According to the pt, he was sitting watching television when he began to feel sudden onset chest discomfort. Pt has a hx of aortic valve replacement on 02/10/13. According to EMS, he was given 2 doses amiodarone with HR being in the 160s. He currently does not have any symptoms. He denies fever, cough, chest pain, back pain, abdominal pain and hx of recent long car rides.     Past Medical History  Diagnosis Date  . CAD S/P percutaneous coronary angioplasty 1993    POBA to Cx x 2 occasions; mild to moderate/nonobstructive CAD on preop cath August 2014  . Severe aortic stenosis by prior echocardiogram     per Dr. Rollene Fare 2012; referred for aortic valve replacement  . S/P AVR (aortic valve replacement) 02/10/2013     [address] Magn[address] Ease Pericardial Tissue Valve  . Peripheral vascular disease ([place])      H/O R SFA stent, L Iliac Stent 1996;;  Lower Extremity Angiography Madhavi Hamblen 2015: Occluded left SFA reconstituting at end of the canal, heavily calcified. Three-vessel runoff..;; RCA 50% ostial right common iliac, patent SFA stent -> Referred to Dr. Trula Slade of [facility].  . Shortness of breath     Chronic  . Hypertension   . Dyslipidemia, goal LDL below 70   . Olecranon bursitis of left elbow   . Trigger ring finger of left hand   . Osteoarthritis   . Bradycardia   . GERD (gastroesophageal reflux disease)   . Former heavy tobacco smoker      quit in October 2014  . Atrial fibrillation ([place])   . Tubular adenoma of colon    Past Surgical History  Procedure Laterality Date  . Tonsillectomy      as child  . Right hand surgery    . Trigger finger release  07/23/2011    Procedure: RELEASE TRIGGER FINGER/A-1 PULLEY;  Surgeon: Lorn Junes, MD;  Location: [place];  Service: Orthopedics;  Laterality: Left;  release trigger left ring finger patient had supraclavicular block in preop  . Olecranon bursectomy  07/23/2011    Procedure: OLECRANON BURSA;  Surgeon: Lorn Junes, MD;  Location: [place];  Service: Orthopedics;  Laterality: Left;  excision olecranon bursa left elbow patient had supraclaviular block in preop  .  Coronary angioplasty  1993    Circumflex x 2   . Rotator cuff repair Right   . Aortic valve replacement  02/10/2013    Dr. Servando Snare: [address] Ease Pericardia  . Iliac artery stent Left 1996  . Superficial femoral atery stent Right 1996  . Aortic valve replacement N/A 02/10/2013    AORTIC VALVE REPLACEMENT (AVR);  Surgeon: Grace Isaac, MD;  Location: [place];  Service: Open Heart Surgery;  Laterality: N/A  . Intraoperative transesophageal echocardiogram N/A 02/10/2013    Procedure: INTRAOPERATIVE TRANSESOPHAGEAL ECHOCARDIOGRAM;  Surgeon: Grace Isaac, MD;  Location: [place];  Service: Open Heart Surgery;  Laterality: N/A;  . Transthoracic echocardiogram  03/23/2013    Post-Op Echo #1: EF 50-55%, Mild Conc LVH, Mild HK of Inferolateral wall with paradoxical septal motion (c/w post-op state); Well seated 23 mm Bioprosthetic Valve (area ~1.23 cm2 - normal for valve); Mild MR  . Cardiac catheterization  August 2014    Nonobstructive 30-50% RCA. No significant LAD disease. 30% mid circumflex.  . Peripheral vascular angiogram  07/19/1994    No intervention-recommend medical therapy  . Peripheral vascular angiogram  01/17/1995    Right SFA 80-90# lesion, dilatation performed with a Cordis "Optz 5" 82m x 2cm, inflated a 4-7.5atm for 40 to 70 seconds. Completion angiogram done by hand with "bolus chase" runoff from Primal SFA to foot. [place] in reduction of 80-90% to 20% with excellent flow and without dissection  . Arterial doppler - pre-cabg  02/06/2013    Bilateral 1-39% ICA stenosis  . Cardiac catheterization  11/10/2009    No intervention - recommend medical management  . Cardiovascular stress test  06/24/2012    Diffuse LV hypokinesis with moderately depressed systolic function and EF 399%  . Peripheral vascular angiogram  09/10/2013    Occluded left SFA, heavily calcified with reconstitution at adductor canal. Three-vessel runof.;; Right lower extremity noted 50% ostial right common iliac stenosis with roughly 20 mm gradient. Not considered significant.f  . Left and right heart catheterization with coronary angiogram N/A 01/28/2013    Procedure: LEFT AND RIGHT HEART CATHETERIZATION WITH CORONARY ANGIOGRAM;  Surgeon: DLeonie Man MD;  [facility];  Service: Cardiovascular;  Laterality: N/A;  . Lower extremity angiogram N/A 09/10/2013    Procedure: LOWER EXTREMITY ANGIOGRAM;  Surgeon: JLorretta Harp MD;  [facility];  Service: Cardiovascular;  Laterality: N/A;  . Abdominal angiogram  09/10/2013    Procedure: ABDOMINAL  ANGIOGRAM;  Surgeon: JLorretta Harp MD;  [facility];  Service: Cardiovascular;;  . Transthoracic echocardiogram  March 2017    EF 50-55%. No RWMA.  GR 1 DD.  23 mm bioprosthetic valve functioning normally.  Mild biatrial dilation.   Family History  Problem Relation Age of Onset  . Hypertension Mother   . Cancer Mother     Oral  . Stroke Father    Social History  Substance Use Topics  . Smoking status: Former Smoker -- 0.50 packs/day for 60 years    Types: Cigarettes    Quit date: 01/31/2013  . Smokeless tobacco: Never Used  . Alcohol Use: No    Review of Systems  Constitutional: Negative for fever.  Respiratory: Negative for cough and shortness of breath.   Cardiovascular: Positive for chest pain and palpitations.  Gastrointestinal: Negative for abdominal pain.  Musculoskeletal: Negative for back pain.  All other systems reviewed and are negative.     Allergies  Review of patient's  allergies indicates no known allergies.  Home Medications   Prior to Admission medications   Medication Sig Start Date End Date Taking? Authorizing Provider  amLODipine (NORVASC) 10 MG tablet Take 1 tablet (10 mg total) by mouth daily. 09/29/15   Leonie Man, MD  aspirin EC 325 MG EC tablet Take 1 tablet (325 mg total) by mouth daily. 02/17/13   Donielle Liston Alba, PA-C  atorvastatin (LIPITOR) 80 MG tablet Take 1 tablet (80 mg total) by mouth daily. 09/29/15   Leonie Man, MD  cilostazol (PLETAL) 100 MG tablet Take 1 tablet (100 mg total) by mouth 2 (two) times daily. 09/29/15   Leonie Man, MD  ezetimibe (ZETIA) 10 MG tablet Take 1 tablet (10 mg total) by mouth daily. 09/29/15   Leonie Man, MD  fish oil-omega-3 fatty acids 1000 MG capsule Take 1 g by mouth 2 (two) times daily.     Historical Provider, MD  hydrochlorothiazide (MICROZIDE) 12.5 MG capsule Take 1 capsule (12.5 mg total) by mouth daily. 09/29/15   Leonie Man, MD  lisinopril (PRINIVIL,ZESTRIL) 20 MG  tablet Take 1 tablet (20 mg total) by mouth daily. 09/29/15   Leonie Man, MD  metoprolol tartrate (LOPRESSOR) 25 MG tablet Take 0.5 tablets (12.5 mg total) by mouth 2 (two) times daily. 02/07/15   Leonie Man, MD  multivitamin [facility]) per tablet Take 1 tablet by mouth daily.    Historical Provider, MD  Na Sulfate-K Sulfate-Mg Sulf 17.5-3.13-1.6 GM/180ML SOLN Suprep-Use as directed 11/04/15   Jerene Bears, MD  omeprazole (PRILOSEC) 20 MG capsule Take 20 mg by mouth daily as needed. 05/27/13   Leonie Man, MD  vitamin E 400 UNIT capsule Take 2 capsules by mouth daily.    Historical Provider, MD   Pulse 72  Temp(Src) 97.9 F (36.6 C)  Resp 17  SpO2 97% Physical Exam  Constitutional: He is oriented to person, place, and time. He appears well-developed and well-nourished.  HENT:  Head: Normocephalic and atraumatic.  Eyes: Pupils are equal, round, and reactive to light.  Neck: Normal range of motion. Neck supple.  Cardiovascular: Normal rate, regular rhythm and intact distal pulses.   Pulmonary/Chest: Effort normal and breath sounds normal. No stridor. He has no rales.  Abdominal: Soft. Bowel sounds are normal. There is no tenderness. There is no rebound and no guarding.  Musculoskeletal: Normal range of motion.  Neurological: He is alert and oriented to person, place, and time.  Skin: Skin is warm and dry.  Psychiatric: He has a normal mood and affect.  Nursing note and vitals reviewed.   ED Course  Procedures (including critical care time) DIAGNOSTIC STUDIES: Oxygen Saturation is 94% on 2L [place], adequate by my interpretation.    COORDINATION OF CARE: 11:31 PM Discussed treatment plan with pt at bedside and pt agreed to plan.  Labs Review Labs Reviewed  CBC WITH DIFFERENTIAL/PLATELET  PROTIME-INR  I-STAT CHEM 8, ED  I-STAT TROPOININ, ED    Imaging Review No results found. I have personally reviewed and evaluated these images and lab results as part of my medical  decision-making.   EKG Interpretation   Date/Time:  Monday Nov 07 2015 23:33:38 EDT Ventricular Rate:  83 PR Interval:  244 QRS Duration: 146 QT Interval:  374 QTC Calculation: 439 R Axis:   -68 Text Interpretation:  Sinus rhythm Prolonged PR interval RBBB and LAFB  Confirmed by [facility]  MD, Ardell Makarewicz ([id number]) on 11/07/2015 11:36:19 PM  MDM   Final diagnoses:  None    Filed Vitals:   11/08/15 0030 11/08/15 0045  BP: 103/70 99/78  Pulse: 50 82  Temp:    Resp: 12 14    Results for orders placed or performed during the hospital encounter of 11/07/15  CBC with Differential/Platelet  Result Value Ref Range   WBC 8.8 4.0 - 10.5 K/uL   RBC 3.90 (L) 4.22 - 5.81 MIL/uL   Hemoglobin 11.7 (L) 13.0 - 17.0 g/dL   HCT 36.4 (L) 39.0 - 52.0 %   MCV 93.3 78.0 - 100.0 fL   MCH 30.0 26.0 - 34.0 pg   MCHC 32.1 30.0 - 36.0 g/dL   RDW 14.1 11.5 - 15.5 %   Platelets 175 150 - 400 K/uL   Neutrophils Relative % 76 %   Neutro Abs 6.7 1.7 - 7.7 K/uL   Lymphocytes Relative 16 %   Lymphs Abs 1.4 0.7 - 4.0 K/uL   Monocytes Relative 7 %   Monocytes Absolute 0.6 0.1 - 1.0 K/uL   Eosinophils Relative 1 %   Eosinophils Absolute 0.1 0.0 - 0.7 K/uL   Basophils Relative 0 %   Basophils Absolute 0.0 0.0 - 0.1 K/uL  Protime-INR  Result Value Ref Range   Prothrombin Time 13.4 11.6 - 15.2 seconds   INR 1.00 0.00 - 1.49  I-stat chem 8, ed  Result Value Ref Range   Sodium 139 135 - 145 mmol/L   Potassium 3.8 3.5 - 5.1 mmol/L   Chloride 105 101 - 111 mmol/L   BUN 33 (H) 6 - 20 mg/dL   Creatinine, Ser 1.70 (H) 0.61 - 1.24 mg/dL   Glucose, Bld 125 (H) 65 - 99 mg/dL   Calcium, Ion 1.23 1.13 - 1.30 mmol/L   TCO2 21 0 - 100 mmol/L   Hemoglobin 11.9 (L) 13.0 - 17.0 g/dL   HCT 35.0 (L) 39.0 - 52.0 %  I-stat troponin, ED  Result Value Ref Range   Troponin i, poc 0.19 (HH) 0.00 - 0.08 ng/mL   Comment NOTIFIED PHYSICIAN    Comment 3           Dg Chest Portable 1 View  11/08/2015  CLINICAL  DATA:  Arrhythemia tonight.  Shortness of breath. EXAM: PORTABLE CHEST 1 VIEW COMPARISON:  03/12/2013 FINDINGS: Patient is post median sternotomy. Borderline cardiomegaly. No pulmonary edema. Mild hyperinflation and chronic interstitial markings. Scarring at the left lung base. No confluent airspace disease, large pleural effusion or pneumothorax. Overlying monitoring devices in place. IMPRESSION: 1. Borderline cardiomegaly.  No pulmonary edema. 2. Mild hyperinflation and chronic interstitial markings. Electronically Signed   By: Jeb Levering M.D.   On: 11/08/2015 00:10    Medications  heparin bolus via infusion 3,000 Units (not administered)  heparin ADULT infusion 100 units/mL (25000 units/226m sodium chloride 0.45%) (not administered)  sodium chloride 0.9 % bolus 500 mL (0 mLs Intravenous Stopped 11/08/15 0050)  amiodarone (NEXTERONE PREMIX) 360-4.14 MG/200ML-% (1.8 mg/mL) IV infusion (60 mg/hr Intravenous New Bag/Given 11/07/15 2346)    MDM Number of Diagnoses or Management Options V-tach ([facility]:  Critical Care Total time providing critical care: 30-74 minutes MDM Reviewed: previous chart, nursing note and vitals Reviewed previous: labs, ECG and x-ray Interpretation: labs, ECG and x-ray (positive troponin.  CXR by me with cardiomegaly and increase lung markings) Total time providing critical care: 30-74 minutes. This excludes time spent performing separately reportable procedures and services. Consults: cardiology    CRITICAL CARE Performed by: PCarlisle Beers  Total critical care time: 31  minutes Critical care time was exclusive of separately billable procedures and treating other patients. Critical care was necessary to treat or prevent imminent or life-threatening deterioration. Critical care was time spent personally by me on the following activities: development of treatment plan with patient and/or surrogate as well as nursing, discussions with consultants, evaluation of  patient's response to treatment, examination of patient, obtaining history from patient or surrogate, ordering and performing treatments and interventions, ordering and review of laboratory studies, ordering and review of radiographic studies, pulse oximetry and re-evaluation of patient's condition.  To be admitted by Dr. Philbert Riser  I personally performed the services described in this documentation, which was scribed in my presence. The recorded information has been reviewed and is accurate.      Veatrice Kells, MD 11/08/15 [phone]

## 2015-11-07 NOTE — ED Notes (Signed)
Pt arrived to EMS converted after VTach. Pt arrived sinus Loletha Grayer after complaining of having discomfort in his chest and not feeling well. Patient had an Aortic valve replacement in February 22, 2013. Received Amiordarone 150 mg via EMS x2. Pt alert x 4. No c/o chest pain at this time.

## 2015-11-08 DIAGNOSIS — Z954 Presence of other heart-valve replacement: Secondary | ICD-10-CM | POA: Diagnosis not present

## 2015-11-08 DIAGNOSIS — Z87891 Personal history of nicotine dependence: Secondary | ICD-10-CM | POA: Diagnosis not present

## 2015-11-08 DIAGNOSIS — Z7982 Long term (current) use of aspirin: Secondary | ICD-10-CM | POA: Diagnosis not present

## 2015-11-08 DIAGNOSIS — I739 Peripheral vascular disease, unspecified: Secondary | ICD-10-CM | POA: Diagnosis present

## 2015-11-08 DIAGNOSIS — Z79899 Other long term (current) drug therapy: Secondary | ICD-10-CM | POA: Diagnosis not present

## 2015-11-08 DIAGNOSIS — N183 Chronic kidney disease, stage 3 (moderate): Secondary | ICD-10-CM | POA: Diagnosis present

## 2015-11-08 DIAGNOSIS — I2510 Atherosclerotic heart disease of native coronary artery without angina pectoris: Secondary | ICD-10-CM

## 2015-11-08 DIAGNOSIS — E785 Hyperlipidemia, unspecified: Secondary | ICD-10-CM | POA: Diagnosis present

## 2015-11-08 DIAGNOSIS — Z952 Presence of prosthetic heart valve: Secondary | ICD-10-CM | POA: Diagnosis not present

## 2015-11-08 DIAGNOSIS — I472 Ventricular tachycardia, unspecified: Secondary | ICD-10-CM

## 2015-11-08 DIAGNOSIS — Z8249 Family history of ischemic heart disease and other diseases of the circulatory system: Secondary | ICD-10-CM | POA: Diagnosis not present

## 2015-11-08 DIAGNOSIS — I248 Other forms of acute ischemic heart disease: Secondary | ICD-10-CM | POA: Diagnosis present

## 2015-11-08 DIAGNOSIS — K219 Gastro-esophageal reflux disease without esophagitis: Secondary | ICD-10-CM | POA: Diagnosis present

## 2015-11-08 DIAGNOSIS — I129 Hypertensive chronic kidney disease with stage 1 through stage 4 chronic kidney disease, or unspecified chronic kidney disease: Secondary | ICD-10-CM | POA: Diagnosis present

## 2015-11-08 DIAGNOSIS — I480 Paroxysmal atrial fibrillation: Secondary | ICD-10-CM | POA: Diagnosis present

## 2015-11-08 LAB — BASIC METABOLIC PANEL
ANION GAP: 4 — AB (ref 5–15)
BUN: 28 mg/dL — ABNORMAL HIGH (ref 6–20)
CHLORIDE: 109 mmol/L (ref 101–111)
CO2: 25 mmol/L (ref 22–32)
Calcium: 9.8 mg/dL (ref 8.9–10.3)
Creatinine, Ser: 1.6 mg/dL — ABNORMAL HIGH (ref 0.61–1.24)
GFR calc non Af Amer: 40 mL/min — ABNORMAL LOW (ref >60)
GFR, EST AFRICAN AMERICAN: 46 mL/min — AB (ref >60)
Glucose, Bld: 112 mg/dL — ABNORMAL HIGH (ref 65–99)
POTASSIUM: 5.2 mmol/L — AB (ref 3.5–5.1)
Sodium: 138 mmol/L (ref 135–145)

## 2015-11-08 LAB — HEPARIN LEVEL (UNFRACTIONATED)
HEPARIN UNFRACTIONATED: 0.75 [IU]/mL — AB (ref 0.30–0.70)
HEPARIN UNFRACTIONATED: 0.56 [IU]/mL (ref 0.30–0.70)

## 2015-11-08 LAB — CBC WITH DIFFERENTIAL/PLATELET
Basophils Absolute: 0 10*3/uL (ref 0.0–0.1)
Basophils Relative: 0 %
EOS ABS: 0.1 10*3/uL (ref 0.0–0.7)
EOS PCT: 1 %
HCT: 36.4 % — ABNORMAL LOW (ref 39.0–52.0)
HEMOGLOBIN: 11.7 g/dL — AB (ref 13.0–17.0)
LYMPHS ABS: 1.4 10*3/uL (ref 0.7–4.0)
Lymphocytes Relative: 16 %
MCH: 30 pg (ref 26.0–34.0)
MCHC: 32.1 g/dL (ref 30.0–36.0)
MCV: 93.3 fL (ref 78.0–100.0)
MONOS PCT: 7 %
Monocytes Absolute: 0.6 10*3/uL (ref 0.1–1.0)
NEUTROS PCT: 76 %
Neutro Abs: 6.7 10*3/uL (ref 1.7–7.7)
Platelets: 175 10*3/uL (ref 150–400)
RBC: 3.9 MIL/uL — ABNORMAL LOW (ref 4.22–5.81)
RDW: 14.1 % (ref 11.5–15.5)
WBC: 8.8 10*3/uL (ref 4.0–10.5)

## 2015-11-08 LAB — I-STAT CHEM 8, ED
BUN: 33 mg/dL — AB (ref 6–20)
CALCIUM ION: 1.23 mmol/L (ref 1.13–1.30)
Chloride: 105 mmol/L (ref 101–111)
Creatinine, Ser: 1.7 mg/dL — ABNORMAL HIGH (ref 0.61–1.24)
Glucose, Bld: 125 mg/dL — ABNORMAL HIGH (ref 65–99)
HCT: 35 % — ABNORMAL LOW (ref 39.0–52.0)
Hemoglobin: 11.9 g/dL — ABNORMAL LOW (ref 13.0–17.0)
Potassium: 3.8 mmol/L (ref 3.5–5.1)
SODIUM: 139 mmol/L (ref 135–145)
TCO2: 21 mmol/L (ref 0–100)

## 2015-11-08 LAB — TROPONIN I
TROPONIN I: 0.63 ng/mL — AB (ref <0.031)
Troponin I: 1.36 ng/mL (ref <0.031)
Troponin I: 1.27 ng/mL (ref <0.031)

## 2015-11-08 LAB — CBC
HEMATOCRIT: 35.8 % — AB (ref 39.0–52.0)
HEMOGLOBIN: 11.5 g/dL — AB (ref 13.0–17.0)
MCH: 29.9 pg (ref 26.0–34.0)
MCHC: 32.1 g/dL (ref 30.0–36.0)
MCV: 93.2 fL (ref 78.0–100.0)
Platelets: 164 10*3/uL (ref 150–400)
RBC: 3.84 MIL/uL — AB (ref 4.22–5.81)
RDW: 14.3 % (ref 11.5–15.5)
WBC: 7 10*3/uL (ref 4.0–10.5)

## 2015-11-08 LAB — TSH: TSH: 4.508 u[IU]/mL — ABNORMAL HIGH (ref 0.350–4.500)

## 2015-11-08 LAB — PROTIME-INR
INR: 1 (ref 0.00–1.49)
PROTHROMBIN TIME: 13.4 s (ref 11.6–15.2)

## 2015-11-08 LAB — I-STAT TROPONIN, ED: Troponin i, poc: 0.19 ng/mL (ref 0.00–0.08)

## 2015-11-08 LAB — MRSA PCR SCREENING: MRSA by PCR: NEGATIVE

## 2015-11-08 LAB — MAGNESIUM: Magnesium: 1.9 mg/dL (ref 1.7–2.4)

## 2015-11-08 MED ORDER — ASPIRIN EC 325 MG PO TBEC
325.0000 mg | DELAYED_RELEASE_TABLET | Freq: Every day | ORAL | Status: DC
  Administered 2015-11-08 – 2015-11-11 (×3): 325 mg via ORAL
  Filled 2015-11-08 (×3): qty 1

## 2015-11-08 MED ORDER — HYDROCHLOROTHIAZIDE 12.5 MG PO CAPS
12.5000 mg | ORAL_CAPSULE | Freq: Every day | ORAL | Status: DC
  Administered 2015-11-08 – 2015-11-11 (×4): 12.5 mg via ORAL
  Filled 2015-11-08 (×4): qty 1

## 2015-11-08 MED ORDER — CETYLPYRIDINIUM CHLORIDE 0.05 % MT LIQD
7.0000 mL | Freq: Two times a day (BID) | OROMUCOSAL | Status: DC
  Administered 2015-11-08 – 2015-11-10 (×5): 7 mL via OROMUCOSAL

## 2015-11-08 MED ORDER — CILOSTAZOL 100 MG PO TABS
100.0000 mg | ORAL_TABLET | Freq: Two times a day (BID) | ORAL | Status: DC
Start: 2015-11-08 — End: 2015-11-11
  Administered 2015-11-08 – 2015-11-11 (×7): 100 mg via ORAL
  Filled 2015-11-08 (×8): qty 1

## 2015-11-08 MED ORDER — VITAMIN E 180 MG (400 UNIT) PO CAPS
800.0000 [IU] | ORAL_CAPSULE | Freq: Every day | ORAL | Status: DC
  Administered 2015-11-08 – 2015-11-11 (×4): 800 [IU] via ORAL
  Filled 2015-11-08 (×4): qty 2

## 2015-11-08 MED ORDER — ADULT MULTIVITAMIN W/MINERALS CH
1.0000 | ORAL_TABLET | Freq: Every day | ORAL | Status: DC
  Administered 2015-11-08 – 2015-11-11 (×4): 1 via ORAL
  Filled 2015-11-08 (×4): qty 1

## 2015-11-08 MED ORDER — ONDANSETRON HCL 4 MG/2ML IJ SOLN: 4.0000 mg | Freq: Four times a day (QID) | INTRAMUSCULAR | Status: DC | PRN

## 2015-11-08 MED ORDER — NITROGLYCERIN 0.4 MG SL SUBL: 0.4000 mg | SUBLINGUAL_TABLET | SUBLINGUAL | Status: DC | PRN

## 2015-11-08 MED ORDER — HEPARIN (PORCINE) IN NACL 100-0.45 UNIT/ML-% IJ SOLN
900.0000 [IU]/h | INTRAMUSCULAR | Status: DC
  Administered 2015-11-08: 1000 [IU]/h via INTRAVENOUS
  Filled 2015-11-08 (×2): qty 250

## 2015-11-08 MED ORDER — POTASSIUM CHLORIDE CRYS ER 20 MEQ PO TBCR
40.0000 meq | EXTENDED_RELEASE_TABLET | Freq: Once | ORAL | Status: AC
  Administered 2015-11-08: 40 meq via ORAL
  Filled 2015-11-08: qty 2

## 2015-11-08 MED ORDER — OMEGA-3-ACID ETHYL ESTERS 1 G PO CAPS
1.0000 g | ORAL_CAPSULE | Freq: Two times a day (BID) | ORAL | Status: DC
  Administered 2015-11-08 – 2015-11-11 (×7): 1 g via ORAL
  Filled 2015-11-08 (×7): qty 1

## 2015-11-08 MED ORDER — METOPROLOL TARTRATE 12.5 MG HALF TABLET
12.5000 mg | ORAL_TABLET | Freq: Two times a day (BID) | ORAL | Status: DC
  Administered 2015-11-08 – 2015-11-11 (×7): 12.5 mg via ORAL
  Filled 2015-11-08 (×7): qty 1

## 2015-11-08 MED ORDER — EZETIMIBE 10 MG PO TABS
10.0000 mg | ORAL_TABLET | Freq: Every day | ORAL | Status: DC
  Administered 2015-11-08 – 2015-11-11 (×4): 10 mg via ORAL
  Filled 2015-11-08 (×4): qty 1

## 2015-11-08 MED ORDER — OMEGA-3 FATTY ACIDS 1000 MG PO CAPS: 1.0000 g | ORAL_CAPSULE | Freq: Two times a day (BID) | ORAL | Status: DC

## 2015-11-08 MED ORDER — HEPARIN BOLUS VIA INFUSION
3000.0000 [IU] | Freq: Once | INTRAVENOUS | Status: AC
  Administered 2015-11-08: 3000 [IU] via INTRAVENOUS
  Filled 2015-11-08: qty 3000

## 2015-11-08 MED ORDER — LISINOPRIL 20 MG PO TABS
20.0000 mg | ORAL_TABLET | Freq: Every day | ORAL | Status: DC
  Filled 2015-11-08: qty 1

## 2015-11-08 MED ORDER — AMLODIPINE BESYLATE 10 MG PO TABS
10.0000 mg | ORAL_TABLET | Freq: Every day | ORAL | Status: DC
  Administered 2015-11-08 – 2015-11-11 (×4): 10 mg via ORAL
  Filled 2015-11-08 (×4): qty 1

## 2015-11-08 MED ORDER — ACETAMINOPHEN 325 MG PO TABS: 650.0000 mg | ORAL_TABLET | ORAL | Status: DC | PRN

## 2015-11-08 MED ORDER — LISINOPRIL 20 MG PO TABS
20.0000 mg | ORAL_TABLET | Freq: Every day | ORAL | Status: DC
  Administered 2015-11-08 – 2015-11-10 (×3): 20 mg via ORAL
  Filled 2015-11-08 (×3): qty 1

## 2015-11-08 MED ORDER — ATORVASTATIN CALCIUM 80 MG PO TABS
80.0000 mg | ORAL_TABLET | Freq: Every day | ORAL | Status: DC
  Administered 2015-11-08 – 2015-11-11 (×4): 80 mg via ORAL
  Filled 2015-11-08 (×4): qty 1

## 2015-11-08 NOTE — ED Notes (Signed)
MD at bedside. 

## 2015-11-08 NOTE — Consult Note (Addendum)
ELECTROPHYSIOLOGY CONSULT NOTE    Patient ID: Ralph Drake MRN: [id number], DOB/AGE: 1934-09-13 81 y.o.  Admit date: 11/07/2015 Date of Consult: 11/08/2015  Primary Physician: [name], MD Primary Cardiologist: [name]  Referring MD: Philbert Riser  Reason for Consultation: VT  HPI:  Ralph Drake is a 81 y.o. male with a past medical history significant for CAD s/p angioplasty, severe AS s/p AVR, hypertension, and claudication.  He called EMS on the day of admission for chest tightness and arm aching.  He also had palpitations, shortness of breath, and nausea.  On EMS arrival, he was found to be in VT (at over 220/min) which terminated with IV amio.  He was transported to [facility] for further evaluation.  He has maintained SR since last night with no recurrent arrhythmias. Echo is pending.  He currently denies chest pain, shortness of breath, recent fevers, chills, or frank syncope.    EP has been asked to evaluate for treatment options.   Past Medical History  Diagnosis Date  . CAD S/P percutaneous coronary angioplasty 1993    POBA to Cx x 2 occasions; mild to moderate/nonobstructive CAD on preop cath August 2014  . Severe aortic stenosis by prior echocardiogram     per Dr. Rollene Fare 2012; referred for aortic valve replacement  . S/P AVR (aortic valve replacement) 02/10/2013    23 Edwards Magna Ease Pericardial Tissue Valve  . Peripheral vascular disease ([place])      H/O R SFA stent, L Iliac Stent 1996;;  Lower Extremity Angiography April 2015: Occluded left SFA reconstituting at end of the canal, heavily calcified. Three-vessel runoff..;; RCA 50% ostial right common iliac, patent SFA stent -> Referred to Dr. Trula Slade [facility].  . Shortness of breath     Chronic  . Hypertension   . Dyslipidemia, goal LDL below 70   . Olecranon bursitis of left elbow   . Trigger ring finger of left hand   . Osteoarthritis   . Bradycardia   . GERD (gastroesophageal reflux disease)   . Former heavy  tobacco smoker      quit in October 2014  . Atrial fibrillation ([place])   . Tubular adenoma of colon      Surgical History:  Past Surgical History  Procedure Laterality Date  . Tonsillectomy      as child  . Right hand surgery    . Trigger finger release  07/23/2011    Procedure: RELEASE TRIGGER FINGER/A-1 PULLEY;  Surgeon: Lorn Junes, MD;  Location: [place];  Service: Orthopedics;  Laterality: Left;  release trigger left ring finger patient had supraclavicular block in preop  . Olecranon bursectomy  07/23/2011    Procedure: OLECRANON BURSA;  Surgeon: Lorn Junes, MD;  Location: [place];  Service: Orthopedics;  Laterality: Left;  excision olecranon bursa left elbow patient had supraclaviular block in preop  . Coronary angioplasty  1993    Circumflex x 2   . Rotator cuff repair Right   . Aortic valve replacement  02/10/2013    Dr. Servando Snare: [address] Ease Pericardia  . Iliac artery stent Left 1996  . Superficial femoral atery stent Right 1996  . Aortic valve replacement N/A 02/10/2013    AORTIC VALVE REPLACEMENT (AVR);  Surgeon: Grace Isaac, MD;  Location: [place];  Service: Open Heart Surgery;  Laterality: N/A  . Intraoperative transesophageal echocardiogram N/A 02/10/2013    Procedure: INTRAOPERATIVE TRANSESOPHAGEAL ECHOCARDIOGRAM;  Surgeon: Grace Isaac, MD;  Location[facility];  Service: Open Heart Surgery;  Laterality: N/A;  . Transthoracic echocardiogram  03/23/2013    Post-Op Echo #1: EF 50-55%, Mild Conc LVH, Mild HK of Inferolateral wall with paradoxical septal motion (c/w post-op state); Well seated 23 mm Bioprosthetic Valve (area ~1.23 cm2 - normal for valve); Mild MR  . Cardiac catheterization  August 2014    Nonobstructive 30-50% RCA. No significant LAD disease. 30% mid circumflex.  . Peripheral vascular angiogram  07/19/1994    No intervention-recommend medical therapy  . Peripheral vascular angiogram  01/17/1995    Right SFA  80-90# lesion, dilatation performed with a Cordis "Optz 5" 80m x 2cm, inflated a 4-7.5atm for 40 to 70 seconds. Completion angiogram done by hand with "bolus chase" runoff from Primal SFA to foot. [place]utling in reduction of 80-90% to 20% with excellent flow and without dissection  . Arterial doppler - pre-cabg  02/06/2013    Bilateral 1-39% ICA stenosis  . Cardiac catheterization  11/10/2009    No intervention - recommend medical management  . Cardiovascular stress test  06/24/2012    Diffuse LV hypokinesis with moderately depressed systolic function and EF 362%  . Peripheral vascular angiogram  09/10/2013    Occluded left SFA, heavily calcified with reconstitution at adductor canal. Three-vessel runof.;; Right lower extremity noted 50% ostial right common iliac stenosis with roughly 20 mm gradient. Not considered significant.f  . Left and right heart catheterization with coronary angiogram N/A 01/28/2013    Procedure: LEFT AND RIGHT HEART CATHETERIZATION WITH CORONARY ANGIOGRAM;  Surgeon: DLeonie Man MD;  [facility];  Service: Cardiovascular;  Laterality: N/A;  . Lower extremity angiogram N/A 09/10/2013    Procedure: LOWER EXTREMITY ANGIOGRAM;  Surgeon: JLorretta Harp MD;  [facility];  Service: Cardiovascular;  Laterality: N/A;  . Abdominal angiogram  09/10/2013    Procedure: ABDOMINAL ANGIOGRAM;  Surgeon: JLorretta Harp MD;  [facility];  Service: Cardiovascular;;  . Transthoracic echocardiogram  March 2017    EF 50-55%. No RWMA.  GR 1 DD.  23 mm bioprosthetic valve functioning normally.  Mild biatrial dilation.     Prescriptions prior to admission  Medication Sig Dispense Refill Last Dose  . amLODipine (NORVASC) 10 MG tablet Take 1 tablet (10 mg total) by mouth daily. 90 tablet 3 11/07/2015 at Unknown time  . aspirin EC 325 MG EC tablet Take 1 tablet (325 mg total) by mouth daily. 30 tablet 0 11/07/2015 at Unknown time  . atorvastatin (LIPITOR) 80 MG tablet  Take 1 tablet (80 mg total) by mouth daily. 90 tablet 3 11/07/2015 at Unknown time  . cilostazol (PLETAL) 100 MG tablet Take 1 tablet (100 mg total) by mouth 2 (two) times daily. 180 tablet 3 11/07/2015 at Unknown time  . ezetimibe (ZETIA) 10 MG tablet Take 1 tablet (10 mg total) by mouth daily. 90 tablet 3 11/07/2015 at Unknown time  . fish oil-omega-3 fatty acids 1000 MG capsule Take 1 g by mouth 2 (two) times daily.    11/07/2015 at Unknown time  . hydrochlorothiazide (MICROZIDE) 12.5 MG capsule Take 1 capsule (12.5 mg total) by mouth daily. 90 capsule 3 11/07/2015 at Unknown time  . lisinopril (PRINIVIL,ZESTRIL) 20 MG tablet Take 1 tablet (20 mg total) by mouth daily. 90 tablet 3 11/07/2015 at Unknown time  . metoprolol tartrate (LOPRESSOR) 25 MG tablet Take 0.5 tablets (12.5 mg total) by mouth 2 (two) times daily. 90 tablet 3 11/07/2015 at 2100  .  multivitamin (THERAGRAN) per tablet Take 1 tablet by mouth daily.   11/07/2015 at Unknown time  . vitamin E 400 UNIT capsule Take 2 capsules by mouth daily.   11/07/2015 at Unknown time    Inpatient Medications:  . amLODipine  10 mg Oral Daily  . antiseptic oral rinse  7 mL Mouth Rinse BID  . aspirin EC  325 mg Oral Daily  . atorvastatin  80 mg Oral Daily  . cilostazol  100 mg Oral BID  . ezetimibe  10 mg Oral Daily  . hydrochlorothiazide  12.5 mg Oral Daily  . lisinopril  20 mg Oral Daily  . metoprolol tartrate  12.5 mg Oral BID  . multivitamin with minerals  1 tablet Oral Daily  . omega-3 acid ethyl esters  1 g Oral BID  . vitamin E  800 Units Oral Daily    Allergies: No Known Allergies  Social History   Social History  . Marital Status: Married    Spouse Name: N/A  . Number of Children: N/A  . Years of Education: N/A   Occupational History  . Not on file.   Social History Main Topics  . Smoking status: Former Smoker -- 0.50 packs/day for 60 years    Types: Cigarettes    Quit date: 01/31/2013  . Smokeless tobacco: Never Used  .  Alcohol Use: No  . Drug Use: No  . Sexual Activity: Not on file   Other Topics Concern  . Not on file   Social History Narrative   Married with no children. He long-term smoker who quit prior to his aVR in 2014.    Up until his operation he worked part-time at [employer].  He is not yet gone back to work postoperatively.   He chose not be cardiac rehabilitation, but has gone back to walking exercising daily.     Family History  Problem Relation Age of Onset  . Hypertension Mother   . Cancer Mother     Oral  . Stroke Father      Review of Systems: All other systems reviewed and are otherwise negative except as noted above.  Physical Exam: Filed Vitals:   11/08/15 0600 11/08/15 0700 11/08/15 0811 11/08/15 0813  BP: 108/58 104/57  121/67  Pulse: 52 50  53  Temp:   98.5 F (36.9 C)   TempSrc:   Oral   Resp: '15 16  21  '$ Height:      Weight:      SpO2: 97% 94%  96%    GEN- The patient is elderly appearing, alert and oriented x 3 today.   HEENT: normocephalic, atraumatic; sclera clear, conjunctiva pink; hearing intact; oropharynx clear; neck supple  Lungs- Clear to ausculation bilaterally, normal work of breathing.  No wheezes, rales, rhonchi Heart- Regular rate and rhythm  GI- soft, non-tender, non-distended, bowel sounds present  Extremities- no clubbing, cyanosis, or edema; DP/PT/radial pulses 2+ bilaterally MS- no significant deformity or atrophy Skin- warm and dry, no rash or lesion Psych- euthymic mood, full affect Neuro- strength and sensation are intact  Labs:   Lab Results  Component Value Date   WBC 8.8 11/08/2015   HGB 11.9* 11/08/2015   HCT 35.0* 11/08/2015   MCV 93.3 11/08/2015   PLT 175 11/08/2015    Recent Labs Lab 11/08/15 0031  NA 139  K 3.8  CL 105  BUN 33*  CREATININE 1.70*  GLUCOSE 125*      Radiology/Studies: Dg Chest Portable 1 View  11/08/2015  CLINICAL DATA:  Arrhythemia tonight.  Shortness of breath. EXAM: PORTABLE CHEST  1 VIEW COMPARISON:  03/12/2013 FINDINGS: Patient is post median sternotomy. Borderline cardiomegaly. No pulmonary edema. Mild hyperinflation and chronic interstitial markings. Scarring at the left lung base. No confluent airspace disease, large pleural effusion or pneumothorax. Overlying monitoring devices in place. IMPRESSION: 1. Borderline cardiomegaly.  No pulmonary edema. 2. Mild hyperinflation and chronic interstitial markings. Electronically Signed   By: Jeb Levering M.D.   On: 11/08/2015 00:10    PRX:YVOPF rhythm, rate 83, RBBB, LAFB, PR 244  TELEMETRY: SR  Assessment/Plan: 1.  Hemodynamically unstable VT The patient presented with hemodynamically unstable VT that terminated with IV amiodarone.  Will need ischemic eval to look for cause.  His creat was 1.7 on admission (baseline around 1.5), will repeat BMET this morning and hydrate today if needed.  Update echo Plan LHC tomorrow.  Risks, benefits discussed with patient by Dr [name] who agrees to proceed.  If no culprit lesions identified on cath, will need ICD for secondary prevention.  No driving x6 months  2.  CAD  Cath tomorrow as above  3.  HTN Stable No change required today  4.  PAF Documented post AVR with no recurrence since Will plan atrial lead at time of device implant to monitor for recurrence  5.  Conduction system disease With probable need for AAD in the future and baseline conduction system disease, will plan for atrial lead at time of ICD implant.    Signed, Chanetta Marshall, NP 11/08/2015 9:06 AM  EP Attending  Patient seen and examined. I agree with the findings as documented above by Chanetta Marshall, NP-C. I have made minimal changes in the note above. On exam he has a RRR with no murmurs, lungs are clear and extremities demonstrate no edema. He has a normal neuro exam. His ECG from the paramedics was reviewed. He had VT at over 220/min with VA dissociation, terminating with IV amio. He has known CAD, and  is s/p stenting. Will plan to repeat heart cath, to look for an ischemia culprit, and if none, ICD implant for secondary prevention of malignant ventricular arrhythmias. As he has some sinus node dysfunction will need a DDD ICD. I would anticipate heart cath tomorrow and ICD implant on Thursday.   Mikle Bosworth.D.

## 2015-11-08 NOTE — Progress Notes (Signed)
ANTICOAGULATION CONSULT NOTE - Follow Up Consult  Pharmacy Consult for heparin Indication: vtach  No Known Allergies  Patient Measurements: Height: '5\' 5"'$  (165.1 cm) Weight: 146 lb 11.2 oz (66.543 kg) IBW/kg (Calculated) : 61.5  Vital Signs: Temp: 98.4 F (36.9 C) (05/30 0400) Temp Source: Oral (05/30 0400) BP: 104/57 mmHg (05/30 0700) Pulse Rate: 50 (05/30 0700)  Labs:  Recent Labs  11/08/15 0011 11/08/15 0031 11/08/15 0329  HGB 11.7* 11.9*  --   HCT 36.4* 35.0*  --   PLT 175  --   --   LABPROT 13.4  --   --   INR 1.00  --   --   CREATININE  --  1.70*  --   TROPONINI  --   --  0.63*    Estimated Creatinine Clearance: 31.2 mL/min (by C-G formula based on Cr of 1.7).  Assessment:  81 y/o M with h/o afib found by EMS with vtach and s/p conversion with amiodarone. No anticoagulation PTA. Pharmacy consulted to dose heparin. CBC stable.   Goal of Therapy:  HL 0.3 - 0.7 Monitor platelets by anticoagulation protocol: yes   Plan:  Heparin 1000 units/hr 8hr HL Daily HL, CBC Monitor for S&S of bleed  Angela Burke, PharmD Pharmacy Resident Pager: [phone] 11/08/2015,8:01 AM

## 2015-11-08 NOTE — Progress Notes (Signed)
ANTICOAGULATION CONSULT NOTE - Follow Up Consult  Pharmacy Consult for heparin Indication: vtach  No Known Allergies  Patient Measurements: Height: '5\' 5"'$  (165.1 cm) Weight: 146 lb 11.2 oz (66.543 kg) IBW/kg (Calculated) : 61.5  Vital Signs: Temp: 97.4 F (36.3 C) (05/30 1111) Temp Source: Oral (05/30 1111) BP: 128/57 mmHg (05/30 1000) Pulse Rate: 55 (05/30 1000)  Labs:  Recent Labs  11/08/15 0011 11/08/15 0031 11/08/15 0329 11/08/15 1039  HGB 11.7* 11.9*  --  11.5*  HCT 36.4* 35.0*  --  35.8*  PLT 175  --   --  164  LABPROT 13.4  --   --   --   INR 1.00  --   --   --   HEPARINUNFRC  --   --   --  0.75*  CREATININE  --  1.70*  --   --   TROPONINI  --   --  0.63*  --     Estimated Creatinine Clearance: 31.2 mL/min (by C-G formula based on Cr of 1.7).  Assessment:  81 y/o M with h/o afib found by EMS with vtach and s/p conversion with amiodarone. No anticoagulation PTA. Pharmacy consulted to dose heparin. CBC stable.   Initial heparin level slightly elevated at 0.75  Goal of Therapy:  HL 0.3 - 0.7 Monitor platelets by anticoagulation protocol: yes   Plan:  Heparin to 900 units / hr Daily HL, CBC Monitor for S&S of bleed  Thank you Anette Guarneri, PharmD [phone]  11/08/2015,11:42 AM

## 2015-11-08 NOTE — H&P (Signed)
HPI: Ralph Drake is a 81 y.o. male with a PMH below who presents to ED via EMS after calling 911 for CP and found to be in sustained VT that converted with IV amiodarone.  Ralph Drake reports he was in his normal state of health when he developed mild chest discomfort described as tightness across chest with associated aching of his arms.  This did not resolve after a little while so he went to bed at which time he noticed his heart racing as well.  Per note, he had associated shortness of breath, diaphoresis, and nausea though currently denies this to me.    EMS strip shows regular, WCT which resolved with amio bolus x 2.  He currently is asymptomatic.     Review of Systems:     Cardiac Review of Systems: {Y] = yes '[ ]'$  = no  Chest Pain [    ]  Resting SOB [   ] Exertional SOB  [  ]  Orthopnea [  ]   Pedal Edema [   ]    Palpitations [  ] Syncope  [  ]   Presyncope [   ]  General Review of Systems: [Y] = yes [  ]=no Constitional: recent weight change [  ]; anorexia [  ]; fatigue [  ]; nausea [  ]; night sweats [  ]; fever [  ]; or chills [  ];                                                                      Dental: poor dentition[  ];   Eye : blurred vision [  ]; diplopia [   ]; vision changes [  ];  Amaurosis fugax[  ]; Resp: cough [  ];  wheezing[  ];  hemoptysis[  ]; shortness of breath[  ]; paroxysmal nocturnal dyspnea[  ]; dyspnea on exertion[  ]; or orthopnea[  ];  GI:  gallstones[  ], vomiting[  ];  dysphagia[  ]; melena[  ];  hematochezia [  ]; heartburn[  ];   GU: kidney stones [  ]; hematuria[  ];   dysuria [  ];  nocturia[  ];               Skin: rash [  ], swelling[  ];, hair loss[  ];  peripheral edema[  ];  or itching[  ]; Musculosketetal: myalgias[  ];  joint swelling[  ];  joint erythema[  ];  joint pain[  ];  back pain[  ];  Heme/Lymph: bruising[  ];  bleeding[  ];  anemia[  ];  Neuro: TIA[  ];  headaches[  ];  stroke[  ];  vertigo[  ];  seizures[  ];    paresthesias[  ];  difficulty walking[  ];  Psych:depression[  ]; anxiety[  ];  Endocrine: diabetes[  ];  thyroid dysfunction[  ];  Other:  Past Medical History  Diagnosis Date  . CAD S/P percutaneous coronary angioplasty 1993    POBA to Cx x 2 occasions; mild to moderate/nonobstructive CAD on preop cath August 2014  . Severe aortic stenosis by prior echocardiogram     per Dr. Rollene Fare 2012; referred for aortic valve  replacement  . S/P AVR (aortic valve replacement) 02/10/2013    23 Edwards Magna Ease Pericardial Tissue Valve  . Peripheral vascular disease ([place])      H/O R SFA stent, L Iliac Stent 1996;;  Lower Extremity Angiography April 2015: Occluded left SFA reconstituting at end of the canal, heavily calcified. Three-vessel runoff..;; RCA 50% ostial right common iliac, patent SFA stent -> Referred to Dr. Trula Slade of [facility].  . Shortness of breath     Chronic  . Hypertension   . Dyslipidemia, goal LDL below 70   . Olecranon bursitis of left elbow   . Trigger ring finger of left hand   . Osteoarthritis   . Bradycardia   . GERD (gastroesophageal reflux disease)   . Former heavy tobacco smoker      quit in October 2014  . Atrial fibrillation ([place])   . Tubular adenoma of colon     No current facility-administered medications on file prior to encounter.   Current Outpatient Prescriptions on File Prior to Encounter  Medication Sig Dispense Refill  . amLODipine (NORVASC) 10 MG tablet Take 1 tablet (10 mg total) by mouth daily. 90 tablet 3  . aspirin EC 325 MG EC tablet Take 1 tablet (325 mg total) by mouth daily. 30 tablet 0  . atorvastatin (LIPITOR) 80 MG tablet Take 1 tablet (80 mg total) by mouth daily. 90 tablet 3  . cilostazol (PLETAL) 100 MG tablet Take 1 tablet (100 mg total) by mouth 2 (two) times daily. 180 tablet 3  . ezetimibe (ZETIA) 10 MG tablet Take 1 tablet (10 mg total) by mouth daily. 90 tablet 3  . fish oil-omega-3 fatty acids 1000 MG capsule Take 1 g by mouth 2  (two) times daily.     . hydrochlorothiazide (MICROZIDE) 12.5 MG capsule Take 1 capsule (12.5 mg total) by mouth daily. 90 capsule 3  . lisinopril (PRINIVIL,ZESTRIL) 20 MG tablet Take 1 tablet (20 mg total) by mouth daily. 90 tablet 3  . metoprolol tartrate (LOPRESSOR) 25 MG tablet Take 0.5 tablets (12.5 mg total) by mouth 2 (two) times daily. 90 tablet 3  . multivitamin (THERAGRAN) per tablet Take 1 tablet by mouth daily.    . vitamin E 400 UNIT capsule Take 2 capsules by mouth daily.       No Known Allergies  Social History   Social History  . Marital Status: Married    Spouse Name: N/A  . Number of Children: N/A  . Years of Education: N/A   Occupational History  . Not on file.   Social History Main Topics  . Smoking status: Former Smoker -- 0.50 packs/day for 60 years    Types: Cigarettes    Quit date: 01/31/2013  . Smokeless tobacco: Never Used  . Alcohol Use: No  . Drug Use: No  . Sexual Activity: Not on file   Other Topics Concern  . Not on file   Social History Narrative   Married with no children. He long-term smoker who quit prior to his aVR in 2014.    Up until his operation he worked part-time at [employer].  He is not yet gone back to work postoperatively.   He chose not be cardiac rehabilitation, but has gone back to walking exercising daily.    Family History  Problem Relation Age of Onset  . Hypertension Mother   . Cancer Mother     Oral  . Stroke Father     PHYSICAL EXAM: Filed  Vitals:   11/08/15 0030 11/08/15 0045  BP: 103/70 99/78  Pulse: 50 82  Temp:    Resp: 12 14   General:  Well appearing. No respiratory difficulty HEENT: normal Neck: supple. no JVD. Carotids 2+ bilat; no bruits. No lymphadenopathy or thryomegaly appreciated. Cor: PMI nondisplaced. Regular rate & rhythm. No rubs, gallops or murmurs. Lungs: clear Abdomen: soft, nontender, nondistended. No hepatosplenomegaly. No bruits or masses. Good bowel  sounds. Extremities: no cyanosis, clubbing, rash, edema Neuro: alert & oriented x 3, cranial nerves grossly intact. moves all 4 extremities w/o difficulty. Affect pleasant.  ECG: SR with SA, FAVB and bifascicular block  Results for orders placed or performed during the hospital encounter of 11/07/15 (from the past 24 hour(s))  CBC with Differential/Platelet     Status: Abnormal   Collection Time: 11/08/15 12:11 AM  Result Value Ref Range   WBC 8.8 4.0 - 10.5 K/uL   RBC 3.90 (L) 4.22 - 5.81 MIL/uL   Hemoglobin 11.7 (L) 13.0 - 17.0 g/dL   HCT 36.4 (L) 39.0 - 52.0 %   MCV 93.3 78.0 - 100.0 fL   MCH 30.0 26.0 - 34.0 pg   MCHC 32.1 30.0 - 36.0 g/dL   RDW 14.1 11.5 - 15.5 %   Platelets 175 150 - 400 K/uL   Neutrophils Relative % 76 %   Neutro Abs 6.7 1.7 - 7.7 K/uL   Lymphocytes Relative 16 %   Lymphs Abs 1.4 0.7 - 4.0 K/uL   Monocytes Relative 7 %   Monocytes Absolute 0.6 0.1 - 1.0 K/uL   Eosinophils Relative 1 %   Eosinophils Absolute 0.1 0.0 - 0.7 K/uL   Basophils Relative 0 %   Basophils Absolute 0.0 0.0 - 0.1 K/uL  Protime-INR     Status: None   Collection Time: 11/08/15 12:11 AM  Result Value Ref Range   Prothrombin Time 13.4 11.6 - 15.2 seconds   INR 1.00 0.00 - 1.49  I-stat troponin, ED     Status: Abnormal   Collection Time: 11/08/15 12:30 AM  Result Value Ref Range   Troponin i, poc 0.19 (HH) 0.00 - 0.08 ng/mL   Comment NOTIFIED PHYSICIAN    Comment 3          I-stat chem 8, ed     Status: Abnormal   Collection Time: 11/08/15 12:31 AM  Result Value Ref Range   Sodium 139 135 - 145 mmol/L   Potassium 3.8 3.5 - 5.1 mmol/L   Chloride 105 101 - 111 mmol/L   BUN 33 (H) 6 - 20 mg/dL   Creatinine, Ser 1.70 (H) 0.61 - 1.24 mg/dL   Glucose, Bld 125 (H) 65 - 99 mg/dL   Calcium, Ion 1.23 1.13 - 1.30 mmol/L   TCO2 21 0 - 100 mmol/L   Hemoglobin 11.9 (L) 13.0 - 17.0 g/dL   HCT 35.0 (L) 39.0 - 52.0 %   Dg Chest Portable 1 View  11/08/2015  CLINICAL DATA:  Arrhythemia  tonight.  Shortness of breath. EXAM: PORTABLE CHEST 1 VIEW COMPARISON:  03/12/2013 FINDINGS: Patient is post median sternotomy. Borderline cardiomegaly. No pulmonary edema. Mild hyperinflation and chronic interstitial markings. Scarring at the left lung base. No confluent airspace disease, large pleural effusion or pneumothorax. Overlying monitoring devices in place. IMPRESSION: 1. Borderline cardiomegaly.  No pulmonary edema. 2. Mild hyperinflation and chronic interstitial markings. Electronically Signed   By: Jeb Levering M.D.   On: 11/08/2015 00:10     ASSESSMENT: 81 yo man  with h/o CAD s/p PTCA in 1993, PVD, bioprosthetic SAVR 2014 with non-obstructive CAD pre-op who called EMS for CP and palpitations and found to be in [facility] with conversion to SR after amio x 2.  I suspect his CP was likely secondary to demand ischemia in setting of tachyrhythmia given his history.  The EMS strip shows a regular, wide-complex, fast tachycardia with very different axis (based off leads I, II and III) from that of his baseline SR with bifascicular block making VT most likely rhythm.  Suspect he will need ICD for secondary prevention and either AAD therapy vs EPS with ablation to reduce likelihood of recurrence.   PLAN/DISCUSSION: KCL 40 x 1 given K of 3.8, check Mg to ensure adequate level Check TSH Cycle troponin, suspect small elevation in setting of demand ischemia but will continue heparin while cycling Will defer on echo at this time given recent (08/2015) normal LVF Pending above, may consider ischemic evaluation (cath 2014 prior to AVR with non-obstructive disease) Will need EP consult with likely ICD for secondary prevention in addition to AAD vs ablation to reduce recurrence Will discontinue amiodarone in case EP decides to pursue EPS If recurs, will bolus with amio and restart drip Keep pads on chest Otherwise, continue home medications including ASA, statin, BB, HCTZ, lisinopril and pletal

## 2015-11-08 NOTE — Progress Notes (Signed)
ANTICOAGULATION CONSULT NOTE - Follow Up Consult  Pharmacy Consult for heparin Indication: vtach  No Known Allergies  Patient Measurements: Height: '5\' 5"'$  (165.1 cm) Weight: 146 lb 11.2 oz (66.543 kg) IBW/kg (Calculated) : 61.5  Vital Signs: Temp: 98.4 F (36.9 C) (05/30 2000) Temp Source: Oral (05/30 2000) BP: 124/54 mmHg (05/30 2106) Pulse Rate: 56 (05/30 2000)  Labs:  Recent Labs  11/08/15 0011 11/08/15 0031 11/08/15 0329 11/08/15 1039 11/08/15 1521 11/08/15 1938  HGB 11.7* 11.9*  --  11.5*  --   --   HCT 36.4* 35.0*  --  35.8*  --   --   PLT 175  --   --  164  --   --   LABPROT 13.4  --   --   --   --   --   INR 1.00  --   --   --   --   --   HEPARINUNFRC  --   --   --  0.75*  --  0.56  CREATININE  --  1.70*  --  1.60*  --   --   TROPONINI  --   --  0.63* 1.36* 1.27*  --     Estimated Creatinine Clearance: 33.1 mL/min (by C-G formula based on Cr of 1.6).  Assessment: 81 y/o Drake with h/o afib found by EMS with vtach and s/p conversion with amiodarone. No anticoagulation PTA. Pharmacy consulted to dose heparin. Plan is for cath tomorrow. CBC stable.   HL is therapeutic at 0.56 on 900 units/hr. No bleeding noted.  Goal of Therapy:  Heparin level 0.3 - 0.7 units/ml Monitor platelets by anticoagulation protocol: yes   Plan:  Continue heparin drip at 900 units/hr Daily HL, CBC Monitor for s/sx of bleeding  [facility], [place].D., BCPS Clinical Pharmacist Pager: [phone] 11/08/2015 9:10 PM

## 2015-11-09 ENCOUNTER — Encounter (HOSPITAL_COMMUNITY): Payer: Self-pay | Admitting: Interventional Cardiology

## 2015-11-09 ENCOUNTER — Encounter (HOSPITAL_COMMUNITY)
Admission: EM | Disposition: A | Discharge status: Home / Self Care | Payer: Self-pay | Source: Home / Self Care | Attending: Internal Medicine

## 2015-11-09 ENCOUNTER — Ambulatory Visit (HOSPITAL_COMMUNITY)
Admission: RE | Admit: 2015-11-09 | Payer: Medicare Other | Source: Ambulatory Visit | Admitting: Interventional Cardiology

## 2015-11-09 DIAGNOSIS — I472 Ventricular tachycardia, unspecified: Secondary | ICD-10-CM | POA: Insufficient documentation

## 2015-11-09 HISTORY — PX: CARDIAC CATHETERIZATION: SHX172

## 2015-11-09 LAB — CBC
HCT: 38.6 % — ABNORMAL LOW (ref 39.0–52.0)
Hemoglobin: 12.5 g/dL — ABNORMAL LOW (ref 13.0–17.0)
MCH: 30.1 pg (ref 26.0–34.0)
MCHC: 32.4 g/dL (ref 30.0–36.0)
MCV: 93 fL (ref 78.0–100.0)
PLATELETS: 183 10*3/uL (ref 150–400)
RBC: 4.15 MIL/uL — AB (ref 4.22–5.81)
RDW: 14.3 % (ref 11.5–15.5)
WBC: 8.7 10*3/uL (ref 4.0–10.5)

## 2015-11-09 LAB — BASIC METABOLIC PANEL
Anion gap: 3 — ABNORMAL LOW (ref 5–15)
BUN: 27 mg/dL — AB (ref 6–20)
CALCIUM: 10.1 mg/dL (ref 8.9–10.3)
CO2: 27 mmol/L (ref 22–32)
CREATININE: 1.58 mg/dL — AB (ref 0.61–1.24)
Chloride: 109 mmol/L (ref 101–111)
GFR calc Af Amer: 47 mL/min — ABNORMAL LOW (ref >60)
GFR, EST NON AFRICAN AMERICAN: 40 mL/min — AB (ref >60)
Glucose, Bld: 113 mg/dL — ABNORMAL HIGH (ref 65–99)
POTASSIUM: 5.1 mmol/L (ref 3.5–5.1)
SODIUM: 139 mmol/L (ref 135–145)

## 2015-11-09 LAB — HEPARIN LEVEL (UNFRACTIONATED): Heparin Unfractionated: 0.52 IU/mL (ref 0.30–0.70)

## 2015-11-09 SURGERY — LEFT HEART CATH AND CORONARY ANGIOGRAPHY

## 2015-11-09 MED ORDER — VERAPAMIL HCL 2.5 MG/ML IV SOLN
INTRAVENOUS | Status: AC
  Filled 2015-11-09: qty 2

## 2015-11-09 MED ORDER — FENTANYL CITRATE (PF) 100 MCG/2ML IJ SOLN
INTRAMUSCULAR | Status: DC | PRN
  Administered 2015-11-09 (×2): 25 ug via INTRAVENOUS

## 2015-11-09 MED ORDER — HEPARIN (PORCINE) IN NACL 2-0.9 UNIT/ML-% IJ SOLN
INTRAMUSCULAR | Status: AC
  Filled 2015-11-09: qty 1000

## 2015-11-09 MED ORDER — SODIUM CHLORIDE 0.9% FLUSH: 3.0000 mL | INTRAVENOUS | Status: DC | PRN

## 2015-11-09 MED ORDER — SODIUM CHLORIDE 0.9 % IV SOLN: 250.0000 mL | INTRAVENOUS | Status: DC | PRN

## 2015-11-09 MED ORDER — HEPARIN SODIUM (PORCINE) 1000 UNIT/ML IJ SOLN
INTRAMUSCULAR | Status: AC
  Filled 2015-11-09: qty 1

## 2015-11-09 MED ORDER — HEPARIN (PORCINE) IN NACL 2-0.9 UNIT/ML-% IJ SOLN
INTRAMUSCULAR | Status: AC
  Filled 2015-11-09: qty 500

## 2015-11-09 MED ORDER — MIDAZOLAM HCL 2 MG/2ML IJ SOLN
INTRAMUSCULAR | Status: DC | PRN
  Administered 2015-11-09 (×2): 0.5 mg via INTRAVENOUS

## 2015-11-09 MED ORDER — SODIUM CHLORIDE 0.9 % WEIGHT BASED INFUSION
3.0000 mL/kg/h | INTRAVENOUS | Status: DC
  Administered 2015-11-09: 3 mL/kg/h via INTRAVENOUS

## 2015-11-09 MED ORDER — HEPARIN (PORCINE) IN NACL 100-0.45 UNIT/ML-% IJ SOLN
900.0000 [IU]/h | INTRAMUSCULAR | Status: DC
  Administered 2015-11-09: 900 [IU]/h via INTRAVENOUS
  Filled 2015-11-09: qty 250

## 2015-11-09 MED ORDER — ONDANSETRON HCL 4 MG/2ML IJ SOLN: 4.0000 mg | Freq: Four times a day (QID) | INTRAMUSCULAR | Status: DC | PRN

## 2015-11-09 MED ORDER — SODIUM CHLORIDE 0.9% FLUSH
3.0000 mL | Freq: Two times a day (BID) | INTRAVENOUS | Status: DC
  Administered 2015-11-09: 3 mL via INTRAVENOUS

## 2015-11-09 MED ORDER — LIDOCAINE HCL (PF) 1 % IJ SOLN
INTRAMUSCULAR | Status: AC
  Filled 2015-11-09: qty 30

## 2015-11-09 MED ORDER — ASPIRIN 81 MG PO CHEW
81.0000 mg | CHEWABLE_TABLET | ORAL | Status: AC
  Administered 2015-11-09: 81 mg via ORAL
  Filled 2015-11-09: qty 1

## 2015-11-09 MED ORDER — SODIUM CHLORIDE 0.9% FLUSH
3.0000 mL | Freq: Two times a day (BID) | INTRAVENOUS | Status: DC
  Administered 2015-11-09 – 2015-11-10 (×4): 3 mL via INTRAVENOUS

## 2015-11-09 MED ORDER — OXYCODONE-ACETAMINOPHEN 5-325 MG PO TABS: 1.0000 | ORAL_TABLET | ORAL | Status: DC | PRN

## 2015-11-09 MED ORDER — HEPARIN (PORCINE) IN NACL 2-0.9 UNIT/ML-% IJ SOLN
INTRAMUSCULAR | Status: DC | PRN
  Administered 2015-11-09: 1500 mL

## 2015-11-09 MED ORDER — SODIUM CHLORIDE 0.9 % WEIGHT BASED INFUSION: 1.0000 mL/kg/h | INTRAVENOUS | Status: AC

## 2015-11-09 MED ORDER — IOPAMIDOL (ISOVUE-370) INJECTION 76%
INTRAVENOUS | Status: DC | PRN
  Administered 2015-11-09: 80 mL via INTRA_ARTERIAL

## 2015-11-09 MED ORDER — HEPARIN SODIUM (PORCINE) 1000 UNIT/ML IJ SOLN
INTRAMUSCULAR | Status: DC | PRN
  Administered 2015-11-09: 3500 [IU] via INTRAVENOUS

## 2015-11-09 MED ORDER — FENTANYL CITRATE (PF) 100 MCG/2ML IJ SOLN
INTRAMUSCULAR | Status: AC
  Filled 2015-11-09: qty 2

## 2015-11-09 MED ORDER — SODIUM CHLORIDE 0.9 % WEIGHT BASED INFUSION: 1.0000 mL/kg/h | INTRAVENOUS | Status: DC

## 2015-11-09 MED ORDER — MIDAZOLAM HCL 2 MG/2ML IJ SOLN
INTRAMUSCULAR | Status: AC
  Filled 2015-11-09: qty 2

## 2015-11-09 MED ORDER — ACETAMINOPHEN 325 MG PO TABS: 650.0000 mg | ORAL_TABLET | ORAL | Status: DC | PRN

## 2015-11-09 MED ORDER — LIDOCAINE HCL (PF) 1 % IJ SOLN
INTRAMUSCULAR | Status: DC | PRN
  Administered 2015-11-09: 2 mL

## 2015-11-09 SURGICAL SUPPLY — 11 items
CATH INFINITI 5 FR JL3.5 (CATHETERS) ×2 IMPLANT
CATH INFINITI JR4 5F (CATHETERS) ×2 IMPLANT
DEVICE RAD COMP TR BAND LRG (VASCULAR PRODUCTS) ×3 IMPLANT
GLIDESHEATH SLEND A-KIT 6F 22G (SHEATH) ×2 IMPLANT
KIT HEART LEFT (KITS) ×3 IMPLANT
NEEDLE 27GAX1X1/2 (NEEDLE) ×2 IMPLANT
PACK CARDIAC CATHETERIZATION (CUSTOM PROCEDURE TRAY) ×3 IMPLANT
TRANSDUCER W/STOPCOCK (MISCELLANEOUS) ×3 IMPLANT
TUBING CIL FLEX 10 FLL-RA (TUBING) ×3 IMPLANT
WIRE HI TORQ VERSACORE-J 145CM (WIRE) ×2 IMPLANT
WIRE SAFE-T 1.5MM-J .035X260CM (WIRE) ×2 IMPLANT

## 2015-11-09 NOTE — Telephone Encounter (Signed)
I don't see what anticoagulation medication he is on. He is on cilostazol which is fine to stop 5 days preprocedure.  Glenetta Hew, MD

## 2015-11-09 NOTE — Progress Notes (Signed)
SUBJECTIVE: The patient is doing well today.  At this time, he denies chest pain, shortness of breath, or any new concerns.  CURRENT MEDICATIONS: . amLODipine  10 mg Oral Daily  . antiseptic oral rinse  7 mL Mouth Rinse BID  . aspirin EC  325 mg Oral Daily  . atorvastatin  80 mg Oral Daily  . cilostazol  100 mg Oral BID  . ezetimibe  10 mg Oral Daily  . hydrochlorothiazide  12.5 mg Oral Daily  . lisinopril  20 mg Oral Daily  . metoprolol tartrate  12.5 mg Oral BID  . multivitamin with minerals  1 tablet Oral Daily  . omega-3 acid ethyl esters  1 g Oral BID  . sodium chloride flush  3 mL Intravenous Q12H  . vitamin E  800 Units Oral Daily   . sodium chloride Stopped (11/09/15 0715)  . heparin 900 Units/hr (11/08/15 2000)    OBJECTIVE: Physical Exam: Filed Vitals:   11/09/15 0600 11/09/15 0612 11/09/15 0700 11/09/15 0809  BP: 143/78  129/64   Pulse: 78  72   Temp:    98.3 F (36.8 C)  TempSrc:    Oral  Resp: 21  14   Height:      Weight:  156 lb 12 oz (71.1 kg)    SpO2: 96%  96%     Intake/Output Summary (Last 24 hours) at 11/09/15 0815 Last data filed at 11/09/15 0700  Gross per 24 hour  Intake 990.38 ml  Output   3350 ml  Net -2359.62 ml    Telemetry reveals sinus rhythm  GEN- The patient is well appearing, alert and oriented x 3 today.   Head- normocephalic, atraumatic Eyes-  Sclera clear, conjunctiva pink Ears- hearing intact Oropharynx- clear Neck- supple Lungs- Clear to ausculation bilaterally, normal work of breathing Heart- Regular rate and rhythm GI- soft, NT, ND, + BS Extremities- no clubbing, cyanosis, or edema Skin- no rash or lesion Psych- euthymic mood, full affect Neuro- strength and sensation are intact  LABS: Basic Metabolic Panel:  Recent Labs  11/08/15 0329 11/08/15 1039 11/09/15 0239  NA  --  138 139  K  --  5.2* 5.1  CL  --  109 109  CO2  --  25 27  GLUCOSE  --  112* 113*  BUN  --  28* 27*  CREATININE  --  1.60* 1.58*   CALCIUM  --  9.8 10.1  MG 1.9  --   --    CBC:  Recent Labs  11/08/15 0011  11/08/15 1039 11/09/15 0239  WBC 8.8  --  7.0 8.7  NEUTROABS 6.7  --   --   --   HGB 11.7*  < > 11.5* 12.5*  HCT 36.4*  < > 35.8* 38.6*  MCV 93.3  --  93.2 93.0  PLT 175  --  164 183  < > = values in this interval not displayed. Cardiac Enzymes:  Recent Labs  11/08/15 0329 11/08/15 1039 11/08/15 1521  TROPONINI 0.63* 1.36* 1.27*   Thyroid Function Tests:  Recent Labs  11/08/15 0329  TSH 4.508*    RADIOLOGY: Dg Chest Portable 1 View 11/08/2015  CLINICAL DATA:  Arrhythemia tonight.  Shortness of breath. EXAM: PORTABLE CHEST 1 VIEW COMPARISON:  03/12/2013 FINDINGS: Patient is post median sternotomy. Borderline cardiomegaly. No pulmonary edema. Mild hyperinflation and chronic interstitial markings. Scarring at the left lung base. No confluent airspace disease, large pleural effusion or pneumothorax. Overlying monitoring devices in  place. IMPRESSION: 1. Borderline cardiomegaly.  No pulmonary edema. 2. Mild hyperinflation and chronic interstitial markings. Electronically Signed   By: Jeb Levering M.D.   On: 11/08/2015 00:10    ASSESSMENT AND PLAN:  Active Problems:   Sustained ventricular tachycardia ([place])  1. Hemodynamically unstable VT The patient presented with hemodynamically unstable VT that terminated with IV amiodarone. Will need ischemic eval to look for cause.  Plan LHC today (renal function back to baseline). Risks, benefits discussed with patient by Dr [name] who agrees to proceed.  If no culprit lesions identified on cath, will need ICD for secondary prevention.  No driving x6 months  2. CAD  Cath today as above No recent ischemic symptoms Continue IV Heparin for now  3. HTN Stable No change required today  4. PAF Documented post AVR with no recurrence since Will plan atrial lead at time of device implant to monitor for recurrence  5. Conduction system  disease With probable need for AAD in the future and baseline conduction system disease, will plan for atrial lead at time of ICD implant.   Chanetta Marshall, NP 11/09/2015 8:17 AM  EP Attending  Patient seen and examined. Agree with the findings as documented by Chanetta Marshall, NP-C. The patient has been stable over night and will undergo left heart cath today.  If no indication for intervention, will plan DDD ICD tomorrow. I have discussed the indications, risks/benefits/goals/expectations of the procedure with the patient and he wishes to proceed.   Mikle Bosworth.D.

## 2015-11-09 NOTE — Telephone Encounter (Signed)
Dr Hilarie Fredrickson- Note was previously sent to Dr [name] to ok holding Pletal prior to colonoscopy procedure scheduled 12/06/15. However, I see that in the interm, he went to the ER with V Tach and it appears that he is scheduled for ICD placement on 11/10/15. Shall I cancel procedure for now and put in recall for a few months out????

## 2015-11-09 NOTE — Interval H&P Note (Signed)
Cath Lab Visit (complete for each Cath Lab visit)  Clinical Evaluation Leading to the Procedure:   ACS: Yes.    Non-ACS:    Anginal Classification: CCS Drake  Anti-ischemic medical therapy: Maximal Therapy (2 or more classes of medications)  Non-Invasive Test Results: No non-invasive testing performed  Prior CABG: No previous CABG      History and Physical Interval Note:  11/09/2015 10:19 AM  Ralph Drake  has presented today for surgery, with the diagnosis of vt  The various methods of treatment have been discussed with the patient and family. After consideration of risks, benefits and other options for treatment, the patient has consented to  Procedure(s): Left Heart Cath and Coronary Angiography (N/A) as a surgical intervention .  The patient's history has been reviewed, patient examined, no change in status, stable for surgery.  I have reviewed the patient's chart and labs.  Questions were answered to the patient's satisfaction.     Ralph Drake

## 2015-11-09 NOTE — H&P (View-Only) (Signed)
ELECTROPHYSIOLOGY CONSULT NOTE    Patient ID: Ralph Drake MRN: [id number], DOB/AGE: February 13, 1935 81 y.o.  Admit date: 11/07/2015 Date of Consult: 11/08/2015  Primary Physician: [name], MD Primary Cardiologist: [name]  Referring MD: Philbert Riser  Reason for Consultation: VT  HPI:  Ralph Drake is a 81 y.o. male with a past medical history significant for CAD s/p angioplasty, severe AS s/p AVR, hypertension, and claudication.  He called EMS on the day of admission for chest tightness and arm aching.  He also had palpitations, shortness of breath, and nausea.  On EMS arrival, he was found to be in VT (at over 220/min) which terminated with IV amio.  He was transported to [facility] for further evaluation.  He has maintained SR since last night with no recurrent arrhythmias. Echo is pending.  He currently denies chest pain, shortness of breath, recent fevers, chills, or frank syncope.    EP has been asked to evaluate for treatment options.   Past Medical History  Diagnosis Date  . CAD S/P percutaneous coronary angioplasty 1993    POBA to Cx x 2 occasions; mild to moderate/nonobstructive CAD on preop cath August 2014  . Severe aortic stenosis by prior echocardiogram     per Dr. Rollene Fare 2012; referred for aortic valve replacement  . S/P AVR (aortic valve replacement) 02/10/2013    [address] Ease Pericardial Tissue Valve  . Peripheral vascular disease ([place])      H/O R SFA stent, L Iliac Stent 1996;;  Lower Extremity Angiography April 2015: Occluded left SFA reconstituting at end of the canal, heavily calcified. Three-vessel runoff..;; RCA 50% ostial right common iliac, patent SFA stent -> Referred to Dr. Trula Slade of [facility].  . Shortness of breath     Chronic  . Hypertension   . Dyslipidemia, goal LDL below 70   . Olecranon bursitis of left elbow   . Trigger ring finger of left hand   . Osteoarthritis   . Bradycardia   . GERD (gastroesophageal reflux disease)   . Former heavy  tobacco smoker      quit in October 2014  . Atrial fibrillation ([place])   . Tubular adenoma of colon      Surgical History:  Past Surgical History  Procedure Laterality Date  . Tonsillectomy      as child  . Right hand surgery    . Trigger finger release  07/23/2011    Procedure: RELEASE TRIGGER FINGER/A-1 PULLEY;  Surgeon: Lorn Junes, MD;  Location: [place];  Service: Orthopedics;  Laterality: Left;  release trigger left ring finger patient had supraclavicular block in preop  . Olecranon bursectomy  07/23/2011    Procedure: OLECRANON BURSA;  Surgeon: Lorn Junes, MD;  Location: [place];  Service: Orthopedics;  Laterality: Left;  excision olecranon bursa left elbow patient had supraclaviular block in preop  . Coronary angioplasty  1993    Circumflex x 2   . Rotator cuff repair Right   . Aortic valve replacement  02/10/2013    Dr. Servando Snare: [id number] Ease Pericardia  . Iliac artery stent Left 1996  . Superficial femoral atery stent Right 1996  . Aortic valve replacement N/A 02/10/2013    AORTIC VALVE REPLACEMENT (AVR);  Surgeon: Grace Isaac, MD;  Location: [facility];  Service: Open Heart Surgery;  Laterality: N/A  . Intraoperative transesophageal echocardiogram N/A 02/10/2013    Procedure: INTRAOPERATIVE TRANSESOPHAGEAL ECHOCARDIOGRAM;  Surgeon: Grace Isaac, MD;  Location: [facility];  Service: Open Heart Surgery;  Laterality: N/A;  . Transthoracic echocardiogram  03/23/2013    Post-Op Echo #1: EF 50-55%, Mild Conc LVH, Mild HK of Inferolateral wall with paradoxical septal motion (c/w post-op state); Well seated 23 mm Bioprosthetic Valve (area ~1.23 cm2 - normal for valve); Mild MR  . Cardiac catheterization  August 2014    Nonobstructive 30-50% RCA. No significant LAD disease. 30% mid circumflex.  . Peripheral vascular angiogram  07/19/1994    No intervention-recommend medical therapy  . Peripheral vascular angiogram  01/17/1995    Right SFA  80-90# lesion, dilatation performed with a Cordis "Optz 5" 83m x 2cm, inflated a 4-7.5atm for 40 to 70 seconds. Completion angiogram done by hand with "bolus chase" runoff from Primal SFA to foot. [place]utling in reduction of 80-90% to 20% with excellent flow and without dissection  . Arterial doppler - pre-cabg  02/06/2013    Bilateral 1-39% ICA stenosis  . Cardiac catheterization  11/10/2009    No intervention - recommend medical management  . Cardiovascular stress test  06/24/2012    Diffuse LV hypokinesis with moderately depressed systolic function and EF 374%  . Peripheral vascular angiogram  09/10/2013    Occluded left SFA, heavily calcified with reconstitution at adductor canal. Three-vessel runof.;; Right lower extremity noted 50% ostial right common iliac stenosis with roughly 20 mm gradient. Not considered significant.f  . Left and right heart catheterization with coronary angiogram N/A 01/28/2013    Procedure: LEFT AND RIGHT HEART CATHETERIZATION WITH CORONARY ANGIOGRAM;  Surgeon: DLeonie Man MD;  [facility];  Service: Cardiovascular;  Laterality: N/A;  . Lower extremity angiogram N/A 09/10/2013    Procedure: LOWER EXTREMITY ANGIOGRAM;  Surgeon: JLorretta Harp MD;  [facility];  Service: Cardiovascular;  Laterality: N/A;  . Abdominal angiogram  09/10/2013    Procedure: ABDOMINAL ANGIOGRAM;  Surgeon: JLorretta Harp MD;  [facility];  Service: Cardiovascular;;  . Transthoracic echocardiogram  March 2017    EF 50-55%. No RWMA.  GR 1 DD.  23 mm bioprosthetic valve functioning normally.  Mild biatrial dilation.     Prescriptions prior to admission  Medication Sig Dispense Refill Last Dose  . amLODipine (NORVASC) 10 MG tablet Take 1 tablet (10 mg total) by mouth daily. 90 tablet 3 11/07/2015 at Unknown time  . aspirin EC 325 MG EC tablet Take 1 tablet (325 mg total) by mouth daily. 30 tablet 0 11/07/2015 at Unknown time  . atorvastatin (LIPITOR) 80 MG tablet  Take 1 tablet (80 mg total) by mouth daily. 90 tablet 3 11/07/2015 at Unknown time  . cilostazol (PLETAL) 100 MG tablet Take 1 tablet (100 mg total) by mouth 2 (two) times daily. 180 tablet 3 11/07/2015 at Unknown time  . ezetimibe (ZETIA) 10 MG tablet Take 1 tablet (10 mg total) by mouth daily. 90 tablet 3 11/07/2015 at Unknown time  . fish oil-omega-3 fatty acids 1000 MG capsule Take 1 g by mouth 2 (two) times daily.    11/07/2015 at Unknown time  . hydrochlorothiazide (MICROZIDE) 12.5 MG capsule Take 1 capsule (12.5 mg total) by mouth daily. 90 capsule 3 11/07/2015 at Unknown time  . lisinopril (PRINIVIL,ZESTRIL) 20 MG tablet Take 1 tablet (20 mg total) by mouth daily. 90 tablet 3 11/07/2015 at Unknown time  . metoprolol tartrate (LOPRESSOR) 25 MG tablet Take 0.5 tablets (12.5 mg total) by mouth 2 (two) times daily. 90 tablet 3 11/07/2015 at 2100  .  multivitamin (THERAGRAN) per tablet Take 1 tablet by mouth daily.   11/07/2015 at Unknown time  . vitamin E 400 UNIT capsule Take 2 capsules by mouth daily.   11/07/2015 at Unknown time    Inpatient Medications:  . amLODipine  10 mg Oral Daily  . antiseptic oral rinse  7 mL Mouth Rinse BID  . aspirin EC  325 mg Oral Daily  . atorvastatin  80 mg Oral Daily  . cilostazol  100 mg Oral BID  . ezetimibe  10 mg Oral Daily  . hydrochlorothiazide  12.5 mg Oral Daily  . lisinopril  20 mg Oral Daily  . metoprolol tartrate  12.5 mg Oral BID  . multivitamin with minerals  1 tablet Oral Daily  . omega-3 acid ethyl esters  1 g Oral BID  . vitamin E  800 Units Oral Daily    Allergies: No Known Allergies  Social History   Social History  . Marital Status: Married    Spouse Name: N/A  . Number of Children: N/A  . Years of Education: N/A   Occupational History  . Not on file.   Social History Main Topics  . Smoking status: Former Smoker -- 0.50 packs/day for 60 years    Types: Cigarettes    Quit date: 01/31/2013  . Smokeless tobacco: Never Used  .  Alcohol Use: No  . Drug Use: No  . Sexual Activity: Not on file   Other Topics Concern  . Not on file   Social History Narrative   Married with no children. He long-term smoker who quit prior to his aVR in 2014.    Up until his operation he worked part-time at [employer].  He is not yet gone back to work postoperatively.   He chose not be cardiac rehabilitation, but has gone back to walking exercising daily.     Family History  Problem Relation Age of Onset  . Hypertension Mother   . Cancer Mother     Oral  . Stroke Father      Review of Systems: All other systems reviewed and are otherwise negative except as noted above.  Physical Exam: Filed Vitals:   11/08/15 0600 11/08/15 0700 11/08/15 0811 11/08/15 0813  BP: 108/58 104/57  121/67  Pulse: 52 50  53  Temp:   98.5 F (36.9 C)   TempSrc:   Oral   Resp: '15 16  21  '$ Height:      Weight:      SpO2: 97% 94%  96%    GEN- The patient is elderly appearing, alert and oriented x 3 today.   HEENT: normocephalic, atraumatic; sclera clear, conjunctiva pink; hearing intact; oropharynx clear; neck supple  Lungs- Clear to ausculation bilaterally, normal work of breathing.  No wheezes, rales, rhonchi Heart- Regular rate and rhythm  GI- soft, non-tender, non-distended, bowel sounds present  Extremities- no clubbing, cyanosis, or edema; DP/PT/radial pulses 2+ bilaterally MS- no significant deformity or atrophy Skin- warm and dry, no rash or lesion Psych- euthymic mood, full affect Neuro- strength and sensation are intact  Labs:   Lab Results  Component Value Date   WBC 8.8 11/08/2015   HGB 11.9* 11/08/2015   HCT 35.0* 11/08/2015   MCV 93.3 11/08/2015   PLT 175 11/08/2015    Recent Labs Lab 11/08/15 0031  NA 139  K 3.8  CL 105  BUN 33*  CREATININE 1.70*  GLUCOSE 125*      Radiology/Studies: Dg Chest Portable 1 View  11/08/2015  CLINICAL DATA:  Arrhythemia tonight.  Shortness of breath. EXAM: PORTABLE CHEST  1 VIEW COMPARISON:  03/12/2013 FINDINGS: Patient is post median sternotomy. Borderline cardiomegaly. No pulmonary edema. Mild hyperinflation and chronic interstitial markings. Scarring at the left lung base. No confluent airspace disease, large pleural effusion or pneumothorax. Overlying monitoring devices in place. IMPRESSION: 1. Borderline cardiomegaly.  No pulmonary edema. 2. Mild hyperinflation and chronic interstitial markings. Electronically Signed   By: Jeb Levering M.D.   On: 11/08/2015 00:10    JSR:PRXYV rhythm, rate 83, RBBB, LAFB, PR 244  TELEMETRY: SR  Assessment/Plan: 1.  Hemodynamically unstable VT The patient presented with hemodynamically unstable VT that terminated with IV amiodarone.  Will need ischemic eval to look for cause.  His creat was 1.7 on admission (baseline around 1.5), will repeat BMET this morning and hydrate today if needed.  Update echo Plan LHC tomorrow.  Risks, benefits discussed with patient by Dr [name] who agrees to proceed.  If no culprit lesions identified on cath, will need ICD for secondary prevention.  No driving x6 months  2.  CAD  Cath tomorrow as above  3.  HTN Stable No change required today  4.  PAF Documented post AVR with no recurrence since Will plan atrial lead at time of device implant to monitor for recurrence  5.  Conduction system disease With probable need for AAD in the future and baseline conduction system disease, will plan for atrial lead at time of ICD implant.    Signed, Chanetta Marshall, NP 11/08/2015 9:06 AM  EP Attending  Patient seen and examined. I agree with the findings as documented above by Chanetta Marshall, NP-C. I have made minimal changes in the note above. On exam he has a RRR with no murmurs, lungs are clear and extremities demonstrate no edema. He has a normal neuro exam. His ECG from the paramedics was reviewed. He had VT at over 220/min with VA dissociation, terminating with IV amio. He has known CAD, and  is s/p stenting. Will plan to repeat heart cath, to look for an ischemia culprit, and if none, ICD implant for secondary prevention of malignant ventricular arrhythmias. As he has some sinus node dysfunction will need a DDD ICD. I would anticipate heart cath tomorrow and ICD implant on Thursday.   Mikle Bosworth.D.

## 2015-11-09 NOTE — Care Management Important Message (Signed)
Important Message  Patient Details  Name: Ralph Drake MRN: [id number] Date of Birth: Feb 13, 1935   Medicare Important Message Given:  Yes    Loann Quill 11/09/2015, 9:12 AM

## 2015-11-09 NOTE — Progress Notes (Signed)
ANTICOAGULATION CONSULT NOTE - Follow Up Consult  Pharmacy Consult for heparin Indication: vtach  No Known Allergies  Patient Measurements: Height: '5\' 5"'$  (165.1 cm) Weight: 156 lb 12 oz (71.1 kg) IBW/kg (Calculated) : 61.5  Vital Signs: Temp: 98.2 F (36.8 C) (05/31 0346) Temp Source: Oral (05/31 0346) BP: 129/64 mmHg (05/31 0700) Pulse Rate: 72 (05/31 0700)  Labs:  Recent Labs  11/08/15 0011 11/08/15 0031 11/08/15 0329 11/08/15 1039 11/08/15 1521 11/08/15 1938 11/09/15 0239  HGB 11.7* 11.9*  --  11.5*  --   --  12.5*  HCT 36.4* 35.0*  --  35.8*  --   --  38.6*  PLT 175  --   --  164  --   --  183  LABPROT 13.4  --   --   --   --   --   --   INR 1.00  --   --   --   --   --   --   HEPARINUNFRC  --   --   --  0.75*  --  0.56 0.52  CREATININE  --  1.70*  --  1.60*  --   --  1.58*  TROPONINI  --   --  0.63* 1.36* 1.27*  --   --     Estimated Creatinine Clearance: 33.5 mL/min (by C-G formula based on Cr of 1.58).  Assessment: 81 y/o M with h/o afib found by EMS with vtach and s/p conversion with amiodarone. No anticoagulation PTA. Pharmacy consulted to dose heparin. Plan for cath today. CBC stable. HL therapeutic x 2.   Goal of Therapy:  Heparin level 0.3 - 0.7 units/ml Monitor platelets by anticoagulation protocol: yes   Plan:  Continue heparin drip at 900 units/hr Daily HL, CBC Monitor for s/sx of bleeding  Angela Burke, PharmD Pharmacy Resident Pager: [phone]  11/09/2015 7:24 AM

## 2015-11-09 NOTE — Progress Notes (Signed)
ANTICOAGULATION CONSULT NOTE - Follow Up Consult  Pharmacy Consult for heparin Indication: vtach  No Known Allergies  Patient Measurements: Height: '5\' 5"'$  (165.1 cm) Weight: 156 lb 12 oz (71.1 kg) IBW/kg (Calculated) : 61.5  Vital Signs: Temp: 98.3 F (36.8 C) (05/31 0809) Temp Source: Oral (05/31 0809) BP: 109/58 mmHg (05/31 1215) Pulse Rate: 59 (05/31 1215)  Labs:  Recent Labs  11/08/15 0011 11/08/15 0031 11/08/15 0329 11/08/15 1039 11/08/15 1521 11/08/15 1938 11/09/15 0239  HGB 11.7* 11.9*  --  11.5*  --   --  12.5*  HCT 36.4* 35.0*  --  35.8*  --   --  38.6*  PLT 175  --   --  164  --   --  183  LABPROT 13.4  --   --   --   --   --   --   INR 1.00  --   --   --   --   --   --   HEPARINUNFRC  --   --   --  0.75*  --  0.56 0.52  CREATININE  --  1.70*  --  1.60*  --   --  1.58*  TROPONINI  --   --  0.63* 1.36* 1.27*  --   --     Estimated Creatinine Clearance: 33.5 mL/min (by C-G formula based on Cr of 1.58).  Assessment: 81 y/o M with h/o afib found by EMS with vtach and s/p conversion with amiodarone. No anticoagulation PTA. Pharmacy consulted to dose heparin.   S/p cath -- > to resume heparin 8 hours post sheath removal pending EP plan  Goal of Therapy:  Heparin level 0.3 - 0.7 units/ml Monitor platelets by anticoagulation protocol: yes   Plan:  Heparin to resume at 8 pm at 900 units / hr Daily HL, CBC Monitor for s/sx of bleeding  Thank you Anette Guarneri, PharmD [phone] 11/09/2015 12:28 PM

## 2015-11-10 ENCOUNTER — Encounter (HOSPITAL_COMMUNITY)
Admission: EM | Disposition: A | Discharge status: Home / Self Care | Payer: Self-pay | Source: Home / Self Care | Attending: Internal Medicine

## 2015-11-10 HISTORY — PX: EP IMPLANTABLE DEVICE: SHX172B

## 2015-11-10 LAB — BASIC METABOLIC PANEL
ANION GAP: 5 (ref 5–15)
BUN: 19 mg/dL (ref 6–20)
CHLORIDE: 103 mmol/L (ref 101–111)
CO2: 28 mmol/L (ref 22–32)
Calcium: 10.5 mg/dL — ABNORMAL HIGH (ref 8.9–10.3)
Creatinine, Ser: 1.53 mg/dL — ABNORMAL HIGH (ref 0.61–1.24)
GFR calc Af Amer: 48 mL/min — ABNORMAL LOW (ref >60)
GFR calc non Af Amer: 42 mL/min — ABNORMAL LOW (ref >60)
Glucose, Bld: 110 mg/dL — ABNORMAL HIGH (ref 65–99)
POTASSIUM: 5.5 mmol/L — AB (ref 3.5–5.1)
SODIUM: 136 mmol/L (ref 135–145)

## 2015-11-10 LAB — CBC
HCT: 42.5 % (ref 39.0–52.0)
HEMOGLOBIN: 14.1 g/dL (ref 13.0–17.0)
MCH: 30.7 pg (ref 26.0–34.0)
MCHC: 33.2 g/dL (ref 30.0–36.0)
MCV: 92.4 fL (ref 78.0–100.0)
PLATELETS: 192 10*3/uL (ref 150–400)
RBC: 4.6 MIL/uL (ref 4.22–5.81)
RDW: 14.1 % (ref 11.5–15.5)
WBC: 8.1 10*3/uL (ref 4.0–10.5)

## 2015-11-10 LAB — SURGICAL PCR SCREEN
MRSA, PCR: NEGATIVE
Staphylococcus aureus: NEGATIVE

## 2015-11-10 LAB — HEPARIN LEVEL (UNFRACTIONATED)

## 2015-11-10 SURGERY — ICD IMPLANT

## 2015-11-10 MED ORDER — ACETAMINOPHEN 325 MG PO TABS
325.0000 mg | ORAL_TABLET | ORAL | Status: DC | PRN
  Administered 2015-11-11: 650 mg via ORAL
  Filled 2015-11-10: qty 2

## 2015-11-10 MED ORDER — SODIUM CHLORIDE 0.9 % IR SOLN: 80.0000 mg | Status: DC

## 2015-11-10 MED ORDER — CHLORHEXIDINE GLUCONATE 4 % EX LIQD
60.0000 mL | Freq: Once | CUTANEOUS | Status: AC
  Administered 2015-11-10: 4 via TOPICAL
  Filled 2015-11-10: qty 60

## 2015-11-10 MED ORDER — MIDAZOLAM HCL 5 MG/5ML IJ SOLN
INTRAMUSCULAR | Status: DC | PRN
  Administered 2015-11-10 (×4): 1 mg via INTRAVENOUS

## 2015-11-10 MED ORDER — SODIUM CHLORIDE 0.9 % IV SOLN
INTRAVENOUS | Status: DC
Start: 2015-11-10 — End: 2015-11-10
  Administered 2015-11-10: 06:00:00 via INTRAVENOUS

## 2015-11-10 MED ORDER — FENTANYL CITRATE (PF) 100 MCG/2ML IJ SOLN
INTRAMUSCULAR | Status: AC
  Filled 2015-11-10: qty 2

## 2015-11-10 MED ORDER — CHLORHEXIDINE GLUCONATE 4 % EX LIQD
60.0000 mL | Freq: Once | CUTANEOUS | Status: AC
  Administered 2015-11-10: 4 via TOPICAL

## 2015-11-10 MED ORDER — CEFAZOLIN SODIUM 1-5 GM-% IV SOLN
1.0000 g | Freq: Four times a day (QID) | INTRAVENOUS | Status: AC
  Administered 2015-11-10 – 2015-11-11 (×3): 1 g via INTRAVENOUS
  Filled 2015-11-10 (×3): qty 50

## 2015-11-10 MED ORDER — MIDAZOLAM HCL 5 MG/5ML IJ SOLN
INTRAMUSCULAR | Status: AC
  Filled 2015-11-10: qty 5

## 2015-11-10 MED ORDER — FENTANYL CITRATE (PF) 100 MCG/2ML IJ SOLN
INTRAMUSCULAR | Status: DC | PRN
  Administered 2015-11-10 (×2): 12.5 ug via INTRAVENOUS
  Administered 2015-11-10: 25 ug via INTRAVENOUS

## 2015-11-10 MED ORDER — CHLORHEXIDINE GLUCONATE 4 % EX LIQD
CUTANEOUS | Status: AC
  Filled 2015-11-10: qty 60

## 2015-11-10 MED ORDER — CEFAZOLIN SODIUM-DEXTROSE 2-4 GM/100ML-% IV SOLN
2.0000 g | INTRAVENOUS | Status: AC
  Administered 2015-11-10: 2 g via INTRAVENOUS

## 2015-11-10 MED ORDER — HEPARIN (PORCINE) IN NACL 2-0.9 UNIT/ML-% IJ SOLN
INTRAMUSCULAR | Status: AC
  Filled 2015-11-10: qty 500

## 2015-11-10 MED ORDER — ONDANSETRON HCL 4 MG/2ML IJ SOLN: 4.0000 mg | Freq: Four times a day (QID) | INTRAMUSCULAR | Status: DC | PRN

## 2015-11-10 MED FILL — Verapamil HCl IV Soln 2.5 MG/ML: INTRAVENOUS | Qty: 2 | Status: AC

## 2015-11-10 SURGICAL SUPPLY — 7 items
CABLE SURGICAL S-101-97-12 (CABLE) ×2 IMPLANT
ICD ELLIPSE DR CD2411-36C (ICD Generator) ×3 IMPLANT
LEAD DURATA 7122-60CM (Lead) ×2 IMPLANT
LEAD TENDRIL MRI 52CM LPA1200M (Lead) ×3 IMPLANT
PAD DEFIB LIFELINK (PAD) ×2 IMPLANT
SHEATH CLASSIC 8F (SHEATH) ×6 IMPLANT
TRAY PACEMAKER INSERTION (PACKS) ×2 IMPLANT

## 2015-11-10 NOTE — Telephone Encounter (Signed)
I would recommend changing to late July or sometime in August, given recent admission and ICD placement I would like him to have his cardiology follow-up 1st (before colon) Thanks

## 2015-11-10 NOTE — Progress Notes (Signed)
SUBJECTIVE: The patient is doing well today.  At this time, he denies chest pain, shortness of breath, or any new concerns.  CURRENT MEDICATIONS: . amLODipine  10 mg Oral Daily  . antiseptic oral rinse  7 mL Mouth Rinse BID  . aspirin EC  325 mg Oral Daily  . atorvastatin  80 mg Oral Daily  .  ceFAZolin (ANCEF) IV  2 g Intravenous To SSTC  . chlorhexidine  60 mL Topical Once  . chlorhexidine      . cilostazol  100 mg Oral BID  . ezetimibe  10 mg Oral Daily  . gentamicin irrigation  80 mg Irrigation To SSTC  . hydrochlorothiazide  12.5 mg Oral Daily  . lisinopril  20 mg Oral Daily  . metoprolol tartrate  12.5 mg Oral BID  . multivitamin with minerals  1 tablet Oral Daily  . omega-3 acid ethyl esters  1 g Oral BID  . sodium chloride flush  3 mL Intravenous Q12H  . vitamin E  800 Units Oral Daily   . sodium chloride 50 mL/hr at 11/10/15 0559  . heparin Stopped (11/10/15 0300)    OBJECTIVE: Physical Exam: Filed Vitals:   11/10/15 0605 11/10/15 0700 11/10/15 0741 11/10/15 0800  BP: 123/80 117/78  110/80  Pulse: 84 80  88  Temp:   97.7 F (36.5 C)   TempSrc:   Oral   Resp: '18 16  20  '$ Height:      Weight:      SpO2: 96% 97%  98%    Intake/Output Summary (Last 24 hours) at 11/10/15 0842 Last data filed at 11/10/15 0800  Gross per 24 hour  Intake 1495.08 ml  Output   1275 ml  Net 220.08 ml    Telemetry reveals sinus rhythm  GEN- The patient is well appearing, alert and oriented x 3 today.   Head- normocephalic, atraumatic Eyes-  Sclera clear, conjunctiva pink Ears- hearing intact Oropharynx- clear Neck- supple Lungs- Clear to ausculation bilaterally, normal work of breathing Heart- Regular rate and rhythm GI- soft, NT, ND, + BS Extremities- no clubbing, cyanosis, or edema Skin- no rash or lesion Psych- euthymic mood, full affect Neuro- strength and sensation are intact  LABS: Basic Metabolic Panel:  Recent Labs  11/08/15 0329  11/09/15 0239  11/10/15 0409  NA  --   < > 139 136  K  --   < > 5.1 5.5*  CL  --   < > 109 103  CO2  --   < > 27 28  GLUCOSE  --   < > 113* 110*  BUN  --   < > 27* 19  CREATININE  --   < > 1.58* 1.53*  CALCIUM  --   < > 10.1 10.5*  MG 1.9  --   --   --   < > = values in this interval not displayed. CBC:  Recent Labs  11/08/15 0011  11/09/15 0239 11/10/15 0409  WBC 8.8  < > 8.7 8.1  NEUTROABS 6.7  --   --   --   HGB 11.7*  < > 12.5* 14.1  HCT 36.4*  < > 38.6* 42.5  MCV 93.3  < > 93.0 92.4  PLT 175  < > 183 192  < > = values in this interval not displayed. Cardiac Enzymes:  Recent Labs  11/08/15 0329 11/08/15 1039 11/08/15 1521  TROPONINI 0.63* 1.36* 1.27*   Thyroid Function Tests:  Recent Labs  11/08/15 0329  TSH 4.508*    RADIOLOGY: Dg Chest Portable 1 View 11/08/2015  CLINICAL DATA:  Arrhythemia tonight.  Shortness of breath. EXAM: PORTABLE CHEST 1 VIEW COMPARISON:  03/12/2013 FINDINGS: Patient is post median sternotomy. Borderline cardiomegaly. No pulmonary edema. Mild hyperinflation and chronic interstitial markings. Scarring at the left lung base. No confluent airspace disease, large pleural effusion or pneumothorax. Overlying monitoring devices in place. IMPRESSION: 1. Borderline cardiomegaly.  No pulmonary edema. 2. Mild hyperinflation and chronic interstitial markings. Electronically Signed   By: Jeb Levering M.D.   On: 11/08/2015 00:10    ASSESSMENT AND PLAN:  Active Problems:   Sustained ventricular tachycardia (HCC)   V-tach ([place])  1. Hemodynamically unstable VT The patient presented with hemodynamically unstable VT that terminated with IV amiodarone. Cath yesterday with no ischemic cause for VT Plan ICD implant today for secondary prevention  No driving x6 months - patient and wife aware  2. CAD  Cath with moderate to moderately severe RCA disease, moderate disease in mid LAD and mid circ Dr Tamala Julian recommended Plavix - will start after ICD implant   3.  HTN Stable No change required today  4. PAF Documented post AVR with no recurrence since Will plan atrial lead at time of device implant to monitor for recurrence  5. Conduction system disease With probable need for AAD in the future and baseline conduction system disease, will plan for atrial lead at time of ICD implant.   Chanetta Marshall, NP 11/10/2015 8:42 AM   EP Attending  Patient seen and examined. Will await results of cardiac cath. If no ischemia, will plan ICD.  Mikle Bosworth.D.

## 2015-11-10 NOTE — H&P (Signed)
  ICD Criteria  Current LVEF:55%. Within 12 months prior to implant: Yes   Heart failure history: Yes, Class I  Cardiomyopathy history: Yes, Mixed Ischemic and Non-Ischemic Cardiomyopathies.  Atrial Fibrillation/Atrial Flutter: No.  Ventricular tachycardia history: Yes, Hemodynamic instability present. VT Type: Sustained Ventricular Tachycardia - Monomorphic.  Cardiac arrest history: No.  History of syndromes with risk of sudden death: No.  Previous ICD: No.  Current ICD indication: Secondary  PPM indication: No.   Class I or II Bradycardia indication present: No  Beta Blocker therapy for 3 or more months: Yes, prescribed.   Ace Inhibitor/ARB therapy for 3 or more months: Yes, prescribed.

## 2015-11-10 NOTE — Interval H&P Note (Signed)
History and Physical Interval Note: Cath results noted. Will plan to proceed with ICD implant for hemodynamically unstable VT.  11/10/2015 2:12 PM  Ralph Drake  has presented today for surgery, with the diagnosis of vt  The various methods of treatment have been discussed with the patient and family. After consideration of risks, benefits and other options for treatment, the patient has consented to  Procedure(s): ICD Implant (N/A) as a surgical intervention .  The patient's history has been reviewed, patient examined, no change in status, stable for surgery.  I have reviewed the patient's chart and labs.  Questions were answered to the patient's satisfaction.     Ralph Drake

## 2015-11-10 NOTE — Progress Notes (Signed)
Pt 02 sat in 80s on 2 liters 02, see flow sheet, increased 02 to 4 liters sats improved, still periodic desats into upper 80s, RT notified.  Edward Qualia RN

## 2015-11-10 NOTE — H&P (View-Only) (Signed)
SUBJECTIVE: The patient is doing well today.  At this time, he denies chest pain, shortness of breath, or any new concerns.  CURRENT MEDICATIONS: . amLODipine  10 mg Oral Daily  . antiseptic oral rinse  7 mL Mouth Rinse BID  . aspirin EC  325 mg Oral Daily  . atorvastatin  80 mg Oral Daily  . cilostazol  100 mg Oral BID  . ezetimibe  10 mg Oral Daily  . hydrochlorothiazide  12.5 mg Oral Daily  . lisinopril  20 mg Oral Daily  . metoprolol tartrate  12.5 mg Oral BID  . multivitamin with minerals  1 tablet Oral Daily  . omega-3 acid ethyl esters  1 g Oral BID  . sodium chloride flush  3 mL Intravenous Q12H  . vitamin E  800 Units Oral Daily   . sodium chloride Stopped (11/09/15 0715)  . heparin 900 Units/hr (11/08/15 2000)    OBJECTIVE: Physical Exam: Filed Vitals:   11/09/15 0600 11/09/15 0612 11/09/15 0700 11/09/15 0809  BP: 143/78  129/64   Pulse: 78  72   Temp:    98.3 F (36.8 C)  TempSrc:    Oral  Resp: 21  14   Height:      Weight:  156 lb 12 oz (71.1 kg)    SpO2: 96%  96%     Intake/Output Summary (Last 24 hours) at 11/09/15 0815 Last data filed at 11/09/15 0700  Gross per 24 hour  Intake 990.38 ml  Output   3350 ml  Net -2359.62 ml    Telemetry reveals sinus rhythm  GEN- The patient is well appearing, alert and oriented x 3 today.   Head- normocephalic, atraumatic Eyes-  Sclera clear, conjunctiva pink Ears- hearing intact Oropharynx- clear Neck- supple Lungs- Clear to ausculation bilaterally, normal work of breathing Heart- Regular rate and rhythm GI- soft, NT, ND, + BS Extremities- no clubbing, cyanosis, or edema Skin- no rash or lesion Psych- euthymic mood, full affect Neuro- strength and sensation are intact  LABS: Basic Metabolic Panel:  Recent Labs  11/08/15 0329 11/08/15 1039 11/09/15 0239  NA  --  138 139  K  --  5.2* 5.1  CL  --  109 109  CO2  --  25 27  GLUCOSE  --  112* 113*  BUN  --  28* 27*  CREATININE  --  1.60* 1.58*   CALCIUM  --  9.8 10.1  MG 1.9  --   --    CBC:  Recent Labs  11/08/15 0011  11/08/15 1039 11/09/15 0239  WBC 8.8  --  7.0 8.7  NEUTROABS 6.7  --   --   --   HGB 11.7*  < > 11.5* 12.5*  HCT 36.4*  < > 35.8* 38.6*  MCV 93.3  --  93.2 93.0  PLT 175  --  164 183  < > = values in this interval not displayed. Cardiac Enzymes:  Recent Labs  11/08/15 0329 11/08/15 1039 11/08/15 1521  TROPONINI 0.63* 1.36* 1.27*   Thyroid Function Tests:  Recent Labs  11/08/15 0329  TSH 4.508*    RADIOLOGY: Dg Chest Portable 1 View 11/08/2015  CLINICAL DATA:  Arrhythemia tonight.  Shortness of breath. EXAM: PORTABLE CHEST 1 VIEW COMPARISON:  03/12/2013 FINDINGS: Patient is post median sternotomy. Borderline cardiomegaly. No pulmonary edema. Mild hyperinflation and chronic interstitial markings. Scarring at the left lung base. No confluent airspace disease, large pleural effusion or pneumothorax. Overlying monitoring devices in  place. IMPRESSION: 1. Borderline cardiomegaly.  No pulmonary edema. 2. Mild hyperinflation and chronic interstitial markings. Electronically Signed   By: Jeb Levering M.D.   On: 11/08/2015 00:10    ASSESSMENT AND PLAN:  Active Problems:   Sustained ventricular tachycardia ([place])  1. Hemodynamically unstable VT The patient presented with hemodynamically unstable VT that terminated with IV amiodarone. Will need ischemic eval to look for cause.  Plan LHC today (renal function back to baseline). Risks, benefits discussed with patient by Dr [name] who agrees to proceed.  If no culprit lesions identified on cath, will need ICD for secondary prevention.  No driving x6 months  2. CAD  Cath today as above No recent ischemic symptoms Continue IV Heparin for now  3. HTN Stable No change required today  4. PAF Documented post AVR with no recurrence since Will plan atrial lead at time of device implant to monitor for recurrence  5. Conduction system  disease With probable need for AAD in the future and baseline conduction system disease, will plan for atrial lead at time of ICD implant.   Chanetta Marshall, NP 11/09/2015 8:17 AM  EP Attending  Patient seen and examined. Agree with the findings as documented by Chanetta Marshall, NP-C. The patient has been stable over night and will undergo left heart cath today.  If no indication for intervention, will plan DDD ICD tomorrow. I have discussed the indications, risks/benefits/goals/expectations of the procedure with the patient and he wishes to proceed.   Mikle Bosworth.D.

## 2015-11-10 NOTE — Discharge Instructions (Signed)
° ° °  Supplemental Discharge Instructions for  Pacemaker/Defibrillator Patients  Activity No heavy lifting or vigorous activity with your left/right arm for 6 to 8 weeks.  Do not raise your left/right arm above your head for one week.  Gradually raise your affected arm as drawn below.           __         11/13/15                    11/14/15                       11/15/15                    11/16/15  NO DRIVING for 6 months   WOUND CARE - Keep the wound area clean and dry.  Do not get this area wet for one week. No showers for one week; you may shower on 11/16/15    . - The tape/steri-strips on your wound will fall off; do not pull them off.  No bandage is needed on the site.  DO  NOT apply any creams, oils, or ointments to the wound area. - If you notice any drainage or discharge from the wound, any swelling or bruising at the site, or you develop a fever > 101? F after you are discharged home, call the office at once.  Special Instructions - You are still able to use cellular telephones; use the ear opposite the side where you have your pacemaker/defibrillator.  Avoid carrying your cellular phone near your device. - When traveling through airports, show security personnel your identification card to avoid being screened in the metal detectors.  Ask the security personnel to use the hand wand. - Avoid arc welding equipment, MRI testing (magnetic resonance imaging), TENS units (transcutaneous nerve stimulators).  Call the office for questions about other devices. - Avoid electrical appliances that are in poor condition or are not properly grounded. - Microwave ovens are safe to be near or to operate.  Additional information for defibrillator patients should your device go off: - If your device goes off ONCE and you feel fine afterward, notify the device clinic nurses. - If your device goes off ONCE and you do not feel well afterward, call 911. - If your device goes off TWICE, call 911. - If your  device goes off THREE times in one day, call 911.  DO NOT DRIVE YOURSELF OR A FAMILY MEMBER WITH A DEFIBRILLATOR TO THE HOSPITAL--CALL 911.

## 2015-11-10 NOTE — Telephone Encounter (Signed)
Patient is currently admitted to the hospital. I have cancelled his 12/06/15 colonosocpy and will contact him in 1-2 weeks to reschedule his procedure and insure he has cardiac follow up prior.

## 2015-11-11 ENCOUNTER — Encounter: Payer: Self-pay | Admitting: Gastroenterology

## 2015-11-11 ENCOUNTER — Other Ambulatory Visit: Payer: Self-pay | Admitting: Physician Assistant

## 2015-11-11 ENCOUNTER — Telehealth: Payer: Self-pay | Admitting: Internal Medicine

## 2015-11-11 ENCOUNTER — Encounter (HOSPITAL_COMMUNITY): Payer: Self-pay | Admitting: Internal Medicine

## 2015-11-11 ENCOUNTER — Inpatient Hospital Stay (HOSPITAL_COMMUNITY): Payer: Medicare Other

## 2015-11-11 DIAGNOSIS — N289 Disorder of kidney and ureter, unspecified: Secondary | ICD-10-CM

## 2015-11-11 MED ORDER — CLOPIDOGREL BISULFATE 75 MG PO TABS: 75.0000 mg | ORAL_TABLET | Freq: Every day | ORAL | Status: DC

## 2015-11-11 MED ORDER — ASPIRIN 81 MG PO TBEC: 81.0000 mg | DELAYED_RELEASE_TABLET | Freq: Every day | ORAL | Status: DC

## 2015-11-11 MED ORDER — LISINOPRIL 10 MG PO TABS: 10.0000 mg | ORAL_TABLET | Freq: Every day | ORAL | Status: DC

## 2015-11-11 MED FILL — Gentamicin Sulfate Inj 40 MG/ML: INTRAMUSCULAR | Qty: 2 | Status: AC

## 2015-11-11 MED FILL — Sodium Chloride Irrigation Soln 0.9%: Qty: 500 | Status: AC

## 2015-11-11 MED FILL — Heparin Sodium (Porcine) 2 Unit/ML in Sodium Chloride 0.9%: INTRAMUSCULAR | Qty: 500 | Status: AC

## 2015-11-11 MED FILL — Cefazolin Sodium-Dextrose IV Solution 2 GM/100ML-4%: INTRAVENOUS | Qty: 100 | Status: AC

## 2015-11-11 NOTE — Progress Notes (Signed)
Discharge instructions received and discussed with pt  And wife. Post cath instructions reviewed and post implant instructions discussed for discharge to home. Verbalized understanding. No questions or concerns expressed. Discharged to home via w/c to front entrance.

## 2015-11-11 NOTE — Discharge Summary (Signed)
ELECTROPHYSIOLOGY PROCEDURE DISCHARGE SUMMARY    Patient ID: Ralph Drake,  MRN: [id number], DOB/AGE: 1934-09-08 81 y.o.  Admit date: 11/07/2015 Discharge date: 11/11/2015  Primary Care Physician: Ralph Pink, MD Primary Cardiologist: Dr. Ellyn Drake Electrophysiologist: Dr. Lovena Drake  Primary Discharge Diagnosis:  1. VT  Secondary Discharge Diagnosis:  1. CAD     PCI in 1993 2. PVD 3. VHD with bioprosthetic AVR 4. Very remote PAF post-op, none noted since     Monitor for any recurrance via his device going forward  No Known Allergies   Procedures This Admission:  1.  Implantation of a STJ dual chamber ICD on 11/10/15 by Dr Ralph Drake.  The patient received a St. Jude (serial Number [id number]) ICD and St. Jude (serial # [id number] ) right atrial lead and a St. Jude (serial number [id number]) right ventricular defibrillator lead.  DFT's were successful at 10 J.  He had inducible VT on EP testing carried out through his ICD. There were no immediate post procedure complications. 2.  CXR on 12/01/15 demonstrated no pneumothorax status post device implantation.  3. LHC 11/09/15, Dr. Tamala Drake Conclusion    1. Prox Cx to Mid Cx lesion, 50% stenosed. 2. Mid LAD lesion, 40% stenosed. 3. Dist RCA lesion, 70% stenosed. 4. Mid RCA lesion, 40% stenosed.   Known solitary kidney with CKD stage III.  Moderate to moderately severe mid to distal RCA with eccentric 50-70% stenosis. Irregularities up to 40-50% noted in the mid vessel.  Moderate disease noted in the mid LAD and mid circumflex.  Left ventriculography and LV hemodynamics were not recorded due to prosthetic tissue valve which we did not cross.  RECOMMENDATIONS:   No obvious explanation for VT based upon coronary anatomy unless there was thrombosis of the right coronary with reperfusion. This seems unlikely given the very trivial increase in troponin.  The electrophysiology service will make further recommendations.  Aggressive  risk factor modification. I would also consider adding Plavix to 81 mg aspirin in the setting of ACS. Hold Plavix until EP has made file decision concerning invasive procedures/device therapy.     Brief HPI: Ralph Drake is a 81 y.o. male was admitted to [facility] 11/07/15 with c/o CP, palpitations, found to be in VT that responded to IV amio bolus x2 by EMS.  He was admitted for further evaluation and care.   Hospital Course:  The patient was admitted, his troponins were mildly elevated, with max of 1.36, and underwent LHC with no intervention on 11/09/15 and underwent implantation of an ICD with details as outlined above the following day. He was monitored on telemetry which demonstrated SR only, no recurrent VT.  Left chest was without hematoma or ecchymosis. Cath site is stable.  The device was interrogated and found to be functioning normally.  The patient was observed to desaturate and required oxygen overnight though this morning off O2 feels well, at rest on RA sat reported by RN to be 96% and while ambulating 92-93%, (patieng smoked for >40 years), CXR was obtained and demonstrated no pneumothorax status post device implantation.  Wound care, arm mobility, and restrictions were reviewed with the patient.  The patient was examined by Dr. Lovena Drake and considered stable for discharge to home.  His K+ was elevated yesterday, will decrease his lisinopril in half and recheck BMET at his wound check visit.  He was recommended to start Plavix for his CAD and will start tomorrow post implant.  The patient is aware of no driving  for 6 mo.  Physical Exam: Filed Vitals:   11/11/15 1130 11/11/15 1138 11/11/15 1142 11/11/15 1148  BP:      Pulse: 83 79 84 79  Temp:      TempSrc:      Resp: '17 17 14 18  '$ Height:      Weight:      SpO2: 91% 92% 96% 99%    GEN- The patient is well appearing, alert and oriented x 3 today.   HEENT: normocephalic, atraumatic; sclera clear, conjunctiva Drake; hearing intact;  oropharynx clear Lungs- Clear to ausculation bilaterally, normal work of breathing.  No wheezes, rales, rhonchi Heart- Regular rate and rhythm, no murmurs, rubs or gallops, PMI not laterally displaced GI- soft, non-tender, non-distended, bowel sounds present Extremities- no clubbing, cyanosis, or edema MS- no significant deformity or atrophy Skin- warm and dry, no rash or lesion, left chest without hematoma/ecchymosis Psych- euthymic mood, full affect Neuro- no gross defecits  Labs:   Lab Results  Component Value Date   WBC 8.1 11/10/2015   HGB 14.1 11/10/2015   HCT 42.5 11/10/2015   MCV 92.4 11/10/2015   PLT 192 11/10/2015     Recent Labs Lab 11/10/15 0409  NA 136  K 5.5*  CL 103  CO2 28  BUN 19  CREATININE 1.53*  CALCIUM 10.5*  GLUCOSE 110*    Discharge Medications:    Medication List    TAKE these medications        amLODipine 10 MG tablet  Commonly known as:  NORVASC  Take 1 tablet (10 mg total) by mouth daily.     aspirin 81 MG EC tablet  Take 1 tablet (81 mg total) by mouth daily.     atorvastatin 80 MG tablet  Commonly known as:  LIPITOR  Take 1 tablet (80 mg total) by mouth daily.     cilostazol 100 MG tablet  Commonly known as:  PLETAL  Take 1 tablet (100 mg total) by mouth 2 (two) times daily.  Notes to Patient:  First dose tomorrow, 11/12/15     clopidogrel 75 MG tablet  Commonly known as:  PLAVIX  Take 1 tablet (75 mg total) by mouth daily.     ezetimibe 10 MG tablet  Commonly known as:  ZETIA  Take 1 tablet (10 mg total) by mouth daily.     fish oil-omega-3 fatty acids 1000 MG capsule  Take 1 g by mouth 2 (two) times daily.     hydrochlorothiazide 12.5 MG capsule  Commonly known as:  MICROZIDE  Take 1 capsule (12.5 mg total) by mouth daily.     lisinopril 10 MG tablet  Commonly known as:  PRINIVIL  Take 1 tablet (10 mg total) by mouth daily.     metoprolol tartrate 25 MG tablet  Commonly known as:  LOPRESSOR  Take 0.5 tablets  (12.5 mg total) by mouth 2 (two) times daily.     multivitamin per tablet  Take 1 tablet by mouth daily.     vitamin E 400 UNIT capsule  Take 2 capsules by mouth daily.        Disposition:  Home Discharge Instructions    Diet - low sodium heart healthy    Complete by:  As directed      Increase activity slowly    Complete by:  As directed           Follow-up Information    Follow up with [facility] On 11/23/2015.  Specialty:  Cardiology   Why:  at 10 AM for wound check and lab draw   Contact information:   [address], [place] [phone]      Follow up with [name], MD On 02/01/2016.   Specialty:  Cardiology   Why:  11:30AM   Contact information:   [address] [address] [phone]       Duration of Discharge Encounter: Greater than 30 minutes including physician time.  Venetia Night, PA-C 11/11/2015 12:31 PM  EP Attending  Patient seen and examined. Agree with above. He is stable today and ready for DC home with usual followup. ICD interrogation demonstrated normal function.   Mikle Bosworth.D.

## 2015-11-11 NOTE — Telephone Encounter (Signed)
7 day TOC fu appt-appt 11-23-15 at 1030a with Hao-wound check for device at [facility].

## 2015-11-11 NOTE — Care Management Important Message (Signed)
Important Message  Patient Details  Name: Ralph Drake MRN: [id number] Date of Birth: 04-23-1935   Medicare Important Message Given:  Yes    Loann Quill 11/11/2015, 9:41 AM

## 2015-11-11 NOTE — Progress Notes (Signed)
Ambulates independently in hallway on room air x 150 ft. SpO2% = 92 with ambulation and recovers back to 96 - 97% back to room. Denies SOB, weakness, dizziness or other c/o.

## 2015-11-14 NOTE — Telephone Encounter (Signed)
Patient contacted regarding discharge from [facility] on 11/11/15.  Patient understands to follow up with provider on 11/23/15 at 10:00 a.m. at [facility] office.  Patient understands discharge instructions?  Yes  Patient understands medications and regiment?  Yes  Patient understands to bring all medications to this visit?  Yes  Other details:

## 2015-11-16 ENCOUNTER — Encounter: Payer: Medicare Other | Admitting: Internal Medicine

## 2015-11-23 ENCOUNTER — Other Ambulatory Visit (INDEPENDENT_AMBULATORY_CARE_PROVIDER_SITE_OTHER): Payer: Medicare Other

## 2015-11-23 ENCOUNTER — Encounter: Payer: Self-pay | Admitting: Physician Assistant

## 2015-11-23 ENCOUNTER — Encounter: Payer: Self-pay | Admitting: Internal Medicine

## 2015-11-23 ENCOUNTER — Ambulatory Visit (INDEPENDENT_AMBULATORY_CARE_PROVIDER_SITE_OTHER): Payer: Medicare Other | Admitting: *Deleted

## 2015-11-23 ENCOUNTER — Ambulatory Visit (INDEPENDENT_AMBULATORY_CARE_PROVIDER_SITE_OTHER): Payer: Medicare Other | Admitting: Physician Assistant

## 2015-11-23 VITALS — BP 126/66 | HR 59 | Ht 65.0 in | Wt 142.1 lb

## 2015-11-23 DIAGNOSIS — Z95810 Presence of automatic (implantable) cardiac defibrillator: Secondary | ICD-10-CM | POA: Diagnosis not present

## 2015-11-23 DIAGNOSIS — I10 Essential (primary) hypertension: Secondary | ICD-10-CM | POA: Diagnosis not present

## 2015-11-23 DIAGNOSIS — N289 Disorder of kidney and ureter, unspecified: Secondary | ICD-10-CM | POA: Diagnosis not present

## 2015-11-23 DIAGNOSIS — I472 Ventricular tachycardia, unspecified: Secondary | ICD-10-CM

## 2015-11-23 DIAGNOSIS — I2510 Atherosclerotic heart disease of native coronary artery without angina pectoris: Secondary | ICD-10-CM | POA: Diagnosis not present

## 2015-11-23 DIAGNOSIS — E785 Hyperlipidemia, unspecified: Secondary | ICD-10-CM

## 2015-11-23 DIAGNOSIS — Z9861 Coronary angioplasty status: Secondary | ICD-10-CM

## 2015-11-23 LAB — CUP PACEART INCLINIC DEVICE CHECK
Date Time Interrogation Session: 20170614112212
HIGH POWER IMPEDANCE MEASURED VALUE: 58.5 Ohm
Implantable Lead Implant Date: 20170601
Implantable Lead Location: 753859
Implantable Lead Model: 7122
Lead Channel Impedance Value: 437.5 Ohm
Lead Channel Impedance Value: 525 Ohm
Lead Channel Pacing Threshold Amplitude: 0.5 V
Lead Channel Pacing Threshold Amplitude: 0.5 V
Lead Channel Pacing Threshold Pulse Width: 0.5 ms
Lead Channel Sensing Intrinsic Amplitude: 2.7 mV
Lead Channel Setting Pacing Amplitude: 3.5 V
Lead Channel Setting Pacing Amplitude: 3.5 V
Lead Channel Setting Sensing Sensitivity: 0.5 mV
MDC IDC LEAD IMPLANT DT: 20170601
MDC IDC LEAD LOCATION: 753860
MDC IDC MSMT BATTERY REMAINING LONGEVITY: 96 mo
MDC IDC MSMT LEADCHNL RA PACING THRESHOLD AMPLITUDE: 0.5 V
MDC IDC MSMT LEADCHNL RA PACING THRESHOLD PULSEWIDTH: 0.5 ms
MDC IDC MSMT LEADCHNL RA PACING THRESHOLD PULSEWIDTH: 0.5 ms
MDC IDC MSMT LEADCHNL RA SENSING INTR AMPL: 3.4 mV
MDC IDC MSMT LEADCHNL RV PACING THRESHOLD AMPLITUDE: 0.5 V
MDC IDC MSMT LEADCHNL RV PACING THRESHOLD PULSEWIDTH: 0.5 ms
MDC IDC SET LEADCHNL RV PACING PULSEWIDTH: 0.5 ms
MDC IDC STAT BRADY RA PERCENT PACED: 17 %
MDC IDC STAT BRADY RV PERCENT PACED: 0.22 %
Pulse Gen Serial Number: 7362271

## 2015-11-23 LAB — BASIC METABOLIC PANEL
BUN: 30 mg/dL — ABNORMAL HIGH (ref 7–25)
CALCIUM: 10.4 mg/dL — AB (ref 8.6–10.3)
CHLORIDE: 104 mmol/L (ref 98–110)
CO2: 22 mmol/L (ref 20–31)
Creat: 1.55 mg/dL — ABNORMAL HIGH (ref 0.70–1.18)
Glucose, Bld: 93 mg/dL (ref 65–99)
POTASSIUM: 4.7 mmol/L (ref 3.5–5.3)
SODIUM: 135 mmol/L (ref 135–146)

## 2015-11-23 NOTE — Progress Notes (Signed)
Wound check appointment. Steri-strips removed. Wound without redness or edema. Incision edges approximated, wound well healed. Normal device function. Thresholds, RA sensing, and impedances consistent with implant measurements. R-waves 2.63m. Device programmed at 3.5V for extra safety margin until 3 month visit. Histogram distribution appropriate for patient and level of activity. No mode switches or ventricular arrhythmias noted. Patient educated about wound care, arm mobility, lifting restrictions, shock plan. ROV 02/01/2016 w/ GT.

## 2015-11-23 NOTE — Patient Instructions (Signed)
Medication Instructions:   Your physician recommends that you continue on your current medications as directed. Please refer to the Current Medication list given to you today.   If you need a refill on your cardiac medications before your next appointment, please call your pharmacy.  Labwork:  BMET TODAY     Testing/Procedures: NONE ORDER TODAY    Follow-Up:   IN 3 MONTHS WITH DR HARDING   Any Other Special Instructions Will Be Listed Below (If Applicable).

## 2015-11-23 NOTE — Progress Notes (Signed)
CARDIOLOGY OFFICE NOTE  Date:  11/23/2015    Carloyn Manner Date of Birth: 05-Dec-1934 Medical Record #[id number]  PCP:  [name], MD  Cardiologist:  Dr. [name]   Primary Electrophysiologist: Dr. [name]  Chief Complaint  Patient presents with  . Hospitalization Follow-up    seen for Dr. [name] and Dr. [name]    History of Present Illness: Ralph Drake is a 81 y.o. male who presents today for post hospital follow-up. He has PMH of CAD (coronary angioplasty x 2 1993), severe AS s/p 23 Edwards Magna Ease Pericardial Tissue Valve 2014, PAD (SFA stent 1996, SFA occlusion 2015), HTN, HLD, bradycardia, known solitary kidney with baseline stage III CKD, and h/o post op PAF without reccurence and not on AC. His last echocardiogram obtained on 08/15/2015 showed EF 50-55%, 23 mm bioprosthesis present in the aortic valve and functioning normally, grade 1 diastolic dysfunction. He was recently admitted from 5/30-6/2 after admitted with sustained VT. This strip showed a regular wide complex acardia resolved after 2 amiodarone bolus. Patient was seen by Dr. [name] on 11/08/2015, given hemodynamically unstable VT that terminated with IV amiodarone, was felt the patient needed ischemic event to rule out underlying cause. He underwent cardiac catheterization on 11/09/2015 which showed 50% proximal left circumflex lesion, 40% mid LAD lesion, 70% distal RCA lesion, 40% mid RCA lesion. There was no obvious culprit for VT based on coronary anatomy unless there was thrombosis of the RCA with reperfusion however this is unlikely given the very trivial increase in the troponin. Due to lack of obvious cause, patient underwent St. Jude dual-chamber ICD implantation by Dr. [name] on 11/10/2015. He was eventually discharged on 11/11/2015, prior to the discharge, his potassium was elevated, his lisinopril was decreased to half. He was recommended to start Plavix for his CAD, this was started today after discharge.    He presents today for follow-up, he denies any syncope or presyncope since discharge. He has been feeling very well. 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 [name]. I have also discussed with him the need to hold off on driving for six-month given the recent VT episode, which he display clear understanding. He says he has been ambulating and has not had any chest discomfort. She denies any recent syncope or presyncope.    Past Medical History  Diagnosis Date  . CAD S/P percutaneous coronary angioplasty 1993    POBA to Cx x 2 occasions; mild to moderate/nonobstructive CAD on preop cath August 2014  . Severe aortic stenosis by prior echocardiogram     per Dr. Rollene Fare 2012; referred for aortic valve replacement  . S/P AVR (aortic valve replacement) 02/10/2013    23 Edwards Magna Ease Pericardial Tissue Valve  . Peripheral vascular disease ([place])      H/O R SFA stent, L Iliac Stent 1996;;  Lower Extremity Angiography April 2015: Occluded left SFA reconstituting at end of the canal, heavily calcified. Three-vessel runoff..;; RCA 50% ostial right common iliac, patent SFA stent -> Referred to Dr. Trula Slade of [facility].  . Shortness of breath     Chronic  . Hypertension   . Dyslipidemia, goal LDL below 70   . Olecranon bursitis of left elbow   . Trigger ring finger of left hand   . Osteoarthritis   . Bradycardia   . GERD (gastroesophageal reflux disease)   . Former heavy tobacco smoker  quit in October 2014  . Atrial fibrillation ([place])   . Tubular adenoma of colon   . VT (ventricular tachycardia) ([place])     hemodynamically significant VT on 11/07/2015, nonobstructive CAD, no culprit lesion, underwent St Jude Dual Chamber ICD by Dr. [name]    Past Surgical History  Procedure Laterality Date  . Tonsillectomy      as child  . Right hand surgery    . Trigger finger release  07/23/2011    Procedure: RELEASE TRIGGER FINGER/A-1  PULLEY;  Surgeon: Lorn Junes, MD;  Location: [facility];  Service: Orthopedics;  Laterality: Left;  release trigger left ring finger patient had supraclavicular block in preop  . Olecranon bursectomy  07/23/2011    Procedure: OLECRANON BURSA;  Surgeon: Lorn Junes, MD;  Location: [facility];  Service: Orthopedics;  Laterality: Left;  excision olecranon bursa left elbow patient had supraclaviular block in preop  . Coronary angioplasty  1993    Circumflex x 2   . Rotator cuff repair Right   . Aortic valve replacement  02/10/2013    Dr. Servando Snare: [address] Ease Pericardia  . Iliac artery stent Left 1996  . Superficial femoral atery stent Right 1996  . Aortic valve replacement N/A 02/10/2013    AORTIC VALVE REPLACEMENT (AVR);  Surgeon: Grace Isaac, MD;  Location: [facility];  Service: Open Heart Surgery;  Laterality: N/A  . Intraoperative transesophageal echocardiogram N/A 02/10/2013    Procedure: INTRAOPERATIVE TRANSESOPHAGEAL ECHOCARDIOGRAM;  Surgeon: Grace Isaac, MD;  Location: [facility];  Service: Open Heart Surgery;  Laterality: N/A;  . Transthoracic echocardiogram  03/23/2013    Post-Op Echo #1: EF 50-55%, Mild Conc LVH, Mild HK of Inferolateral wall with paradoxical septal motion (c/w post-op state); Well seated 23 mm Bioprosthetic Valve (area ~1.23 cm2 - normal for valve); Mild MR  . Cardiac catheterization  August 2014    Nonobstructive 30-50% RCA. No significant LAD disease. 30% mid circumflex.  . Peripheral vascular angiogram  07/19/1994    No intervention-recommend medical therapy  . Peripheral vascular angiogram  01/17/1995    Right SFA 80-90# lesion, dilatation performed with a Cordis "Optz 5" 5m x 2cm, inflated a 4-7.5atm for 40 to 70 seconds. Completion angiogram done by hand with "bolus chase" runoff from Primal SFA to foot. [place] in reduction of 80-90% to 20% with excellent flow and without dissection  . Arterial doppler - pre-cabg   02/06/2013    Bilateral 1-39% ICA stenosis  . Cardiac catheterization  11/10/2009    No intervention - recommend medical management  . Cardiovascular stress test  06/24/2012    Diffuse LV hypokinesis with moderately depressed systolic function and EF 367%  . Peripheral vascular angiogram  09/10/2013    Occluded left SFA, heavily calcified with reconstitution at adductor canal. Three-vessel runof.;; Right lower extremity noted 50% ostial right common iliac stenosis with roughly 20 mm gradient. Not considered significant.f  . Left and right heart catheterization with coronary angiogram N/A 01/28/2013    Procedure: LEFT AND RIGHT HEART CATHETERIZATION WITH CORONARY ANGIOGRAM;  Surgeon: DLeonie Man MD;  [facility];  Service: Cardiovascular;  Laterality: N/A;  . Lower extremity angiogram N/A 09/10/2013    Procedure: LOWER EXTREMITY ANGIOGRAM;  Surgeon: JLorretta Harp MD;  [facility];  Service: Cardiovascular;  Laterality: N/A;  . Abdominal angiogram  09/10/2013    Procedure: ABDOMINAL ANGIOGRAM;  Surgeon: JLorretta Harp MD;  [facility];  Service: Cardiovascular;;  . Transthoracic echocardiogram  March 2017    EF 50-55%. No RWMA.  GR 1 DD.  23 mm bioprosthetic valve functioning normally.  Mild biatrial dilation.  . Cardiac catheterization N/A 11/09/2015    Procedure: Left Heart Cath and Coronary Angiography;  Surgeon: Belva Crome, MD;  Location: [facility] CV LAB;  Service: Cardiovascular;  Laterality: N/A;  . Ep implantable device N/A 11/10/2015    Procedure: ICD Implant;  Surgeon: Evans Lance, MD;  Location: [place] CV LAB;  Service: Cardiovascular;  Laterality: N/A;     Medications: Current Outpatient Prescriptions  Medication Sig Dispense Refill  . amLODipine (NORVASC) 10 MG tablet Take 1 tablet (10 mg total) by mouth daily. 90 tablet 3  . aspirin 81 MG EC tablet Take 1 tablet (81 mg total) by mouth daily.    [name] atorvastatin (LIPITOR) 80 MG tablet  Take 1 tablet (80 mg total) by mouth daily. 90 tablet 3  . cilostazol (PLETAL) 100 MG tablet Take 1 tablet (100 mg total) by mouth 2 (two) times daily. 180 tablet 3  . clopidogrel (PLAVIX) 75 MG tablet Take 1 tablet (75 mg total) by mouth daily. 30 tablet 3  . ezetimibe (ZETIA) 10 MG tablet Take 1 tablet (10 mg total) by mouth daily. 90 tablet 3  . fish oil-omega-3 fatty acids 1000 MG capsule Take 1 g by mouth 2 (two) times daily.     . hydrochlorothiazide (MICROZIDE) 12.5 MG capsule Take 1 capsule (12.5 mg total) by mouth daily. 90 capsule 3  . lisinopril (PRINIVIL) 10 MG tablet Take 1 tablet (10 mg total) by mouth daily. 30 tablet 3  . metoprolol tartrate (LOPRESSOR) 25 MG tablet Take 0.5 tablets (12.5 mg total) by mouth 2 (two) times daily. 90 tablet 3  . multivitamin (THERAGRAN) per tablet Take 1 tablet by mouth daily.    . vitamin E 400 UNIT capsule Take 2 capsules by mouth daily.     No current facility-administered medications for this visit.    Allergies: No Known Allergies  Social History: The patient  reports that he quit smoking about 2 years ago. His smoking use included Cigarettes. He has a 30 pack-year smoking history. He has never used smokeless tobacco. He reports that he does not drink alcohol or use illicit drugs.   Family History: The patient's family history includes Cancer in his mother; Hypertension in his mother; Stroke in his father.   Review of Systems: Please see the history of present illness.   Otherwise, the review of systems is positive for none.   All other systems are reviewed and negative.   Physical Exam: VS:  BP 126/66 mmHg  Pulse 59  Ht '5\' 5"'$  (1.651 m)  Wt 142 lb 1.9 oz (64.465 kg)  BMI 23.65 kg/m2  SpO2 92% .  BMI Body mass index is 23.65 kg/(m^2).  Wt Readings from Last 3 Encounters:  11/23/15 142 lb 1.9 oz (64.465 kg)  11/11/15 143 lb 14.4 oz (65.273 kg)  11/04/15 145 lb (65.772 kg)    General: Pleasant. Well developed, well nourished and  in no acute distress.  HEENT: Normal. Neck: Supple, no JVD, carotid bruits, or masses noted.  Cardiac: Regular rate and rhythm. No murmurs, rubs, or gallops. No edema.  Respiratory:  Lungs are clear to auscultation bilaterally with normal work of breathing.  GI: Soft and nontender.  MS: No deformity or atrophy. Gait and ROM intact. Skin: Warm and dry. Color is normal.  L pectoral PPM site stable, no bleeding or hematoma, no redness. Incision area still covered with steri-strips.  Neuro:  Strength and sensation are intact and no gross focal deficits noted.  Psych: Alert, appropriate and with normal affect.   LABORATORY DATA:  EKG:  EKG is not ordered today.  Lab Results  Component Value Date   WBC 8.1 11/10/2015   HGB 14.1 11/10/2015   HCT 42.5 11/10/2015   PLT 192 11/10/2015   GLUCOSE 93 11/23/2015   CHOL 178 02/07/2015   TRIG 141 02/07/2015   HDL 51 02/07/2015   LDLCALC 99 02/07/2015   ALT 22 02/07/2015   AST 22 02/07/2015   NA 135 11/23/2015   K 4.7 11/23/2015   CL 104 11/23/2015   CREATININE 1.55* 11/23/2015   BUN 30* 11/23/2015   CO2 22 11/23/2015   TSH 4.508* 11/08/2015   PSA 1.91 11/28/2012   INR 1.00 11/08/2015   HGBA1C 6.3* 02/06/2013    Other Studies Reviewed Today: Cath 11/09/2015 Conclusion    1. Prox Cx to Mid Cx lesion, 50% stenosed. 2. Mid LAD lesion, 40% stenosed. 3. Dist RCA lesion, 70% stenosed. 4. Mid RCA lesion, 40% stenosed.   Known solitary kidney with CKD stage III.  Moderate to moderately severe mid to distal RCA with eccentric 50-70% stenosis. Irregularities up to 40-50% noted in the mid vessel.  Moderate disease noted in the mid LAD and mid circumflex.  Left ventriculography and LV hemodynamics were not recorded due to prosthetic tissue valve which we did not cross.  RECOMMENDATIONS:   No obvious explanation for VT based upon coronary anatomy unless there was thrombosis of the right coronary with reperfusion. This seems unlikely  given the very trivial increase in troponin.  The electrophysiology service will make further recommendations.  Aggressive risk factor modification. I would also consider adding Plavix to 81 mg aspirin in the setting of ACS. Hold Plavix until EP has made file decision concerning invasive procedures/device therapy.       ICD implantation by Dr. [name] 11/10/2015  PREPROCEDURE DIAGNOSES:  1. Mixed Ischemic/non-ischemic cardiomyopathy.  2. New York Heart Association class I 3. Hemodynamically unstable ventricular tachycardia   POSTPROCEDURE DIAGNOSES:  1. Mixed Ischemic/non-ischemic cardiomyopathy.  2. New York Heart Association class I heart failure chronically.    PROCEDURES:  1. ICD implantation. 2. EP testing 3. Defibrillation threshold testing   CONCLUSIONS:  1. Mixed Ischemic/non-ischemic cardiomyopathy with VT in the setting of chronic New York Heart Association class I heart failure.  2. Successful ICD implantation.  3. DFT less than or equal to 20 joules.  4. Inducible hemodynamically VT with PES 5. No early apparent complications.    St. Jude (serial # [id number] ) right atrial lead and a St. Jude (serial number [id number]) right ventricular defibrillator lead  St. Jude (serial Number [id number]) ICD   Assessment/Plan:  1. CAD (coronary angioplasty x 2 1993)  - cath 11/09/2015 50% proximal left circumflex lesion, 40% mid LAD lesion, 70% distal RCA lesion, 40% mid RCA lesion. Continue medical therapy, on ASA and plavix  2. H/o sustained VT s/p recent St Jude dual chamber ICD implantation   - wound check at device clinic today to interrogate ICD  - His heart rate is in the high 50s today, I will hold off on uptitrating his beta blocker.  - patent denies any presyncope or syncope episode since discharge.  3. severe AS s/p 23 Edwards Magna Ease Pericardial Tissue Valve 2014: stable on exam.   4.  HTN: stable, BP 126/66 today  5. HLD: on  lipitor   I did NOT bill for Transition of Care visit as original appt was arranged for 7 day TCM, however, the actually scheduled appt was 12 days after discharge. Also patient did not get a phone call within 48 hours, the phone call was made within 72 hours.    Current medicines are reviewed with the patient today.  The patient does not have concerns regarding medicines other than what has been noted above.  The following changes have been made:  See above.  Labs/ tests ordered today include:   No orders of the defined types were placed in this encounter.     Disposition:   FU with Dr. [name] in 2 months and Dr. [name] in 3 months.   Patient is agreeable to this plan and will call if any problems develop in the interim.   SignedAlmyra Deforest PA-C 11/23/2015 10:08 PM  [facility] [address] [place], [place]  [address] Phone: [phone] Fax: [phone]

## 2015-11-24 ENCOUNTER — Telehealth: Payer: Self-pay | Admitting: *Deleted

## 2015-11-24 NOTE — Telephone Encounter (Signed)
-----   Message from [facility], [place] sent at 11/23/2015  5:36 PM EDT ----- Please let the patient know his lab result is stable, potassium level is within normal limits, kidney function appears to be at his baseline.  Thanks  [employer]

## 2015-11-24 NOTE — Telephone Encounter (Signed)
SPOKE WITH PT.

## 2015-11-24 NOTE — Telephone Encounter (Signed)
-----   Message from [facility], [place] sent at 11/23/2015  5:36 PM EDT ----- Please let the patient know his lab result is stable, potassium level is within normal limits, kidney function appears to be at his baseline.  Thanks  [employer]

## 2015-11-29 ENCOUNTER — Emergency Department (HOSPITAL_COMMUNITY): Payer: Medicare Other

## 2015-11-29 ENCOUNTER — Encounter (HOSPITAL_COMMUNITY): Payer: Self-pay | Admitting: *Deleted

## 2015-11-29 ENCOUNTER — Inpatient Hospital Stay (HOSPITAL_COMMUNITY)
Admission: EM | Admit: 2015-11-29 | Discharge: 2015-12-02 | DRG: 309 | Disposition: A | Discharge status: Home / Self Care | Payer: Medicare Other | Attending: Internal Medicine | Admitting: Internal Medicine

## 2015-11-29 ENCOUNTER — Other Ambulatory Visit: Payer: Self-pay

## 2015-11-29 DIAGNOSIS — Z95810 Presence of automatic (implantable) cardiac defibrillator: Secondary | ICD-10-CM

## 2015-11-29 DIAGNOSIS — Z953 Presence of xenogenic heart valve: Secondary | ICD-10-CM

## 2015-11-29 DIAGNOSIS — I2510 Atherosclerotic heart disease of native coronary artery without angina pectoris: Secondary | ICD-10-CM | POA: Diagnosis present

## 2015-11-29 DIAGNOSIS — I472 Ventricular tachycardia, unspecified: Secondary | ICD-10-CM

## 2015-11-29 DIAGNOSIS — I5022 Chronic systolic (congestive) heart failure: Secondary | ICD-10-CM | POA: Diagnosis present

## 2015-11-29 DIAGNOSIS — Z95828 Presence of other vascular implants and grafts: Secondary | ICD-10-CM

## 2015-11-29 DIAGNOSIS — N183 Chronic kidney disease, stage 3 (moderate): Secondary | ICD-10-CM | POA: Diagnosis not present

## 2015-11-29 DIAGNOSIS — Z79899 Other long term (current) drug therapy: Secondary | ICD-10-CM

## 2015-11-29 DIAGNOSIS — Z7982 Long term (current) use of aspirin: Secondary | ICD-10-CM

## 2015-11-29 DIAGNOSIS — E860 Dehydration: Secondary | ICD-10-CM | POA: Diagnosis present

## 2015-11-29 DIAGNOSIS — E785 Hyperlipidemia, unspecified: Secondary | ICD-10-CM | POA: Diagnosis present

## 2015-11-29 DIAGNOSIS — I10 Essential (primary) hypertension: Secondary | ICD-10-CM

## 2015-11-29 DIAGNOSIS — Q600 Renal agenesis, unilateral: Secondary | ICD-10-CM

## 2015-11-29 DIAGNOSIS — Z8249 Family history of ischemic heart disease and other diseases of the circulatory system: Secondary | ICD-10-CM

## 2015-11-29 DIAGNOSIS — R739 Hyperglycemia, unspecified: Secondary | ICD-10-CM

## 2015-11-29 DIAGNOSIS — Z87891 Personal history of nicotine dependence: Secondary | ICD-10-CM

## 2015-11-29 DIAGNOSIS — I4510 Unspecified right bundle-branch block: Secondary | ICD-10-CM | POA: Diagnosis present

## 2015-11-29 DIAGNOSIS — I739 Peripheral vascular disease, unspecified: Secondary | ICD-10-CM | POA: Diagnosis present

## 2015-11-29 DIAGNOSIS — I130 Hypertensive heart and chronic kidney disease with heart failure and stage 1 through stage 4 chronic kidney disease, or unspecified chronic kidney disease: Secondary | ICD-10-CM | POA: Diagnosis present

## 2015-11-29 DIAGNOSIS — Z9861 Coronary angioplasty status: Secondary | ICD-10-CM

## 2015-11-29 DIAGNOSIS — R579 Shock, unspecified: Secondary | ICD-10-CM | POA: Diagnosis present

## 2015-11-29 DIAGNOSIS — I440 Atrioventricular block, first degree: Secondary | ICD-10-CM | POA: Diagnosis present

## 2015-11-29 LAB — CBC
HEMATOCRIT: 38 % — AB (ref 39.0–52.0)
HEMOGLOBIN: 12.7 g/dL — AB (ref 13.0–17.0)
MCH: 30.3 pg (ref 26.0–34.0)
MCHC: 33.4 g/dL (ref 30.0–36.0)
MCV: 90.7 fL (ref 78.0–100.0)
Platelets: 206 10*3/uL (ref 150–400)
RBC: 4.19 MIL/uL — AB (ref 4.22–5.81)
RDW: 13.6 % (ref 11.5–15.5)
WBC: 6.1 10*3/uL (ref 4.0–10.5)

## 2015-11-29 LAB — I-STAT TROPONIN, ED
TROPONIN I, POC: 0.03 ng/mL (ref 0.00–0.08)
Troponin i, poc: 0 ng/mL (ref 0.00–0.08)

## 2015-11-29 LAB — COMPREHENSIVE METABOLIC PANEL
ALK PHOS: 73 U/L (ref 38–126)
ALT: 19 U/L (ref 17–63)
AST: 35 U/L (ref 15–41)
Albumin: 3.8 g/dL (ref 3.5–5.0)
Anion gap: 8 (ref 5–15)
BILIRUBIN TOTAL: 0.6 mg/dL (ref 0.3–1.2)
BUN: 30 mg/dL — AB (ref 6–20)
CHLORIDE: 104 mmol/L (ref 101–111)
CO2: 23 mmol/L (ref 22–32)
Calcium: 10.2 mg/dL (ref 8.9–10.3)
Creatinine, Ser: 1.72 mg/dL — ABNORMAL HIGH (ref 0.61–1.24)
GFR calc non Af Amer: 36 mL/min — ABNORMAL LOW (ref >60)
GFR, EST AFRICAN AMERICAN: 42 mL/min — AB (ref >60)
Glucose, Bld: 280 mg/dL — ABNORMAL HIGH (ref 65–99)
POTASSIUM: 4.4 mmol/L (ref 3.5–5.1)
SODIUM: 135 mmol/L (ref 135–145)
Total Protein: 6.6 g/dL (ref 6.5–8.1)

## 2015-11-29 LAB — PHOSPHORUS: Phosphorus: 2.6 mg/dL (ref 2.5–4.6)

## 2015-11-29 LAB — MAGNESIUM: MAGNESIUM: 1.9 mg/dL (ref 1.7–2.4)

## 2015-11-29 MED ORDER — SODIUM CHLORIDE 0.9% FLUSH: 3.0000 mL | INTRAVENOUS | Status: DC | PRN

## 2015-11-29 MED ORDER — OMEGA-3-ACID ETHYL ESTERS 1 G PO CAPS
1.0000 g | ORAL_CAPSULE | Freq: Two times a day (BID) | ORAL | Status: DC
  Administered 2015-11-30 – 2015-12-02 (×5): 1 g via ORAL
  Filled 2015-11-29 (×5): qty 1

## 2015-11-29 MED ORDER — SODIUM CHLORIDE 0.9 % IV SOLN
INTRAVENOUS | Status: AC
  Administered 2015-11-30: 01:00:00 via INTRAVENOUS

## 2015-11-29 MED ORDER — AMIODARONE HCL IN DEXTROSE 360-4.14 MG/200ML-% IV SOLN
60.0000 mg/h | Freq: Once | INTRAVENOUS | Status: AC
  Administered 2015-11-29: 60 mg/h via INTRAVENOUS

## 2015-11-29 MED ORDER — CILOSTAZOL 100 MG PO TABS
100.0000 mg | ORAL_TABLET | Freq: Two times a day (BID) | ORAL | Status: DC
  Administered 2015-11-30 – 2015-12-02 (×5): 100 mg via ORAL
  Filled 2015-11-29 (×5): qty 1

## 2015-11-29 MED ORDER — AMIODARONE HCL IN DEXTROSE 360-4.14 MG/200ML-% IV SOLN
60.0000 mg/h | INTRAVENOUS | Status: AC
  Administered 2015-11-30: 60 mg/h via INTRAVENOUS
  Filled 2015-11-29: qty 200

## 2015-11-29 MED ORDER — AMIODARONE HCL IN DEXTROSE 360-4.14 MG/200ML-% IV SOLN
30.0000 mg/h | INTRAVENOUS | Status: AC
  Administered 2015-11-30 – 2015-12-01 (×4): 30 mg/h via INTRAVENOUS
  Filled 2015-11-29 (×3): qty 200

## 2015-11-29 MED ORDER — MAGNESIUM SULFATE 2 GM/50ML IV SOLN
2.0000 g | Freq: Once | INTRAVENOUS | Status: AC
  Administered 2015-11-30: 2 g via INTRAVENOUS
  Filled 2015-11-29: qty 50

## 2015-11-29 MED ORDER — ACETAMINOPHEN 650 MG RE SUPP: 650.0000 mg | Freq: Four times a day (QID) | RECTAL | Status: DC | PRN

## 2015-11-29 MED ORDER — HEPARIN SODIUM (PORCINE) 5000 UNIT/ML IJ SOLN
5000.0000 [IU] | Freq: Three times a day (TID) | INTRAMUSCULAR | Status: DC
Start: 2015-11-29 — End: 2015-12-02
  Administered 2015-11-30 – 2015-12-02 (×8): 5000 [IU] via SUBCUTANEOUS
  Filled 2015-11-29 (×8): qty 1

## 2015-11-29 MED ORDER — ASPIRIN EC 81 MG PO TBEC
81.0000 mg | DELAYED_RELEASE_TABLET | Freq: Every day | ORAL | Status: DC
  Administered 2015-11-30 – 2015-12-02 (×3): 81 mg via ORAL
  Filled 2015-11-29 (×3): qty 1

## 2015-11-29 MED ORDER — METOPROLOL TARTRATE 25 MG PO TABS
25.0000 mg | ORAL_TABLET | Freq: Two times a day (BID) | ORAL | Status: DC
  Administered 2015-11-30 – 2015-12-02 (×6): 25 mg via ORAL
  Filled 2015-11-29 (×6): qty 1

## 2015-11-29 MED ORDER — ACETAMINOPHEN 325 MG PO TABS: 650.0000 mg | ORAL_TABLET | Freq: Four times a day (QID) | ORAL | Status: DC | PRN

## 2015-11-29 MED ORDER — SODIUM CHLORIDE 0.9% FLUSH
3.0000 mL | Freq: Two times a day (BID) | INTRAVENOUS | Status: DC
  Administered 2015-11-30 – 2015-12-02 (×6): 3 mL via INTRAVENOUS

## 2015-11-29 MED ORDER — ADULT MULTIVITAMIN W/MINERALS CH
1.0000 | ORAL_TABLET | Freq: Every day | ORAL | Status: DC
  Administered 2015-11-30 – 2015-12-02 (×3): 1 via ORAL
  Filled 2015-11-29 (×3): qty 1

## 2015-11-29 MED ORDER — CLOPIDOGREL BISULFATE 75 MG PO TABS
75.0000 mg | ORAL_TABLET | Freq: Every day | ORAL | Status: DC
  Administered 2015-11-30 – 2015-12-02 (×3): 75 mg via ORAL
  Filled 2015-11-29 (×3): qty 1

## 2015-11-29 MED ORDER — ATORVASTATIN CALCIUM 80 MG PO TABS
80.0000 mg | ORAL_TABLET | Freq: Every day | ORAL | Status: DC
  Administered 2015-11-30 – 2015-12-02 (×3): 80 mg via ORAL
  Filled 2015-11-29 (×3): qty 1

## 2015-11-29 MED ORDER — AMLODIPINE BESYLATE 10 MG PO TABS
10.0000 mg | ORAL_TABLET | Freq: Every day | ORAL | Status: DC
  Administered 2015-11-30 – 2015-12-02 (×3): 10 mg via ORAL
  Filled 2015-11-29 (×3): qty 1

## 2015-11-29 MED ORDER — EZETIMIBE 10 MG PO TABS
10.0000 mg | ORAL_TABLET | Freq: Every day | ORAL | Status: DC
  Administered 2015-11-30 – 2015-12-02 (×3): 10 mg via ORAL
  Filled 2015-11-29 (×3): qty 1

## 2015-11-29 MED ORDER — SODIUM CHLORIDE 0.9 % IV SOLN: 250.0000 mL | INTRAVENOUS | Status: DC | PRN

## 2015-11-29 MED ORDER — SODIUM CHLORIDE 0.9% FLUSH
3.0000 mL | Freq: Two times a day (BID) | INTRAVENOUS | Status: DC
  Administered 2015-11-30 – 2015-12-02 (×3): 3 mL via INTRAVENOUS

## 2015-11-29 NOTE — H&P (Addendum)
Cardiology History & Physical    Patient ID: Ralph Drake MRN: [id number], DOB: Dec 09, 1934 Date of Encounter: 11/29/2015, 10:49 PM Primary Physician: [name], MD Primary Cardiologist: Dr. [name] Primary Electrophysiologist: Dr. [name]  Chief Complaint: ICD shock Reason for Admission: Recurrent VT Requesting MD: Dr. Ashok Cordia  HPI: [name] is a 81 year old man with history of coronary artery disease status post remote angioplasties to the LCx, severe aortic stenosis status post bioprosthetic aortic valve replacement, peripheral vascular disease, mixed ischemic/nonischemic cardiomyopathy with normalization of LV systolic function, remote atrial fibrillation, inducible ventricular tachycardia status post dual-chamber ICD placement almost 3 weeks ago (St. Jude), peripheral vascular disease, and solitary kidney with chronic kidney disease stage III, whom we have been asked to see due to multiple episodes of ICD shocks today.The patient reports feeling well until approximately 6:45 this evening.  He was watching television in his recliner when he suddenly felt a sharp jolt to his chest radiating up to his head.  Approximately 20 minutes later, he experienced another shock with several additional shocks following every 3-4 minutes.  He believes he received 6 shocks in total.  He denies preceding symptoms including palpitations, lightheadedness, chest pain, and shortness of breath.  He had been in his usual state of health earlier in the day, even having walked a mile this morning.  He has not had any recent medication changes with the exception of de-escalation of lisinopril.  He experienced a few episodes of diarrhea 3 days ago.  He has otherwise been eating and drinking as usual.  When EMS arrived at the patient's residence this evening, he was found to be in ventricular tachycardia.  He was given amiodarone 150 mg bolus 2 with subsequent conversion to sinus rhythm with right bundle branch  block (baseline rhythm).  Of note, the patient underwent ICD placement on 11/10/15 due to a preceding episode of sustained ventricular tachycardia with subsequent EP study demonstrating inducible VT.  His most recent cardiac catheterization on 11/09/15 revealed 40-50% disease involving the proximal and mid LCx, mid LAD, and mid RCA.  A 70% distal RCA stenosis was also identified.  However, it was felt that this disease did not explain his recent sustained VT.  Past Medical History  Diagnosis Date  . CAD S/P percutaneous coronary angioplasty 1993    POBA to Cx x 2 occasions; mild to moderate/nonobstructive CAD on preop cath August 2014  . Severe aortic stenosis by prior echocardiogram     per Dr. Rollene Fare 2012; referred for aortic valve replacement  . S/P AVR (aortic valve replacement) 02/10/2013    [address] Ease Pericardial Tissue Valve  . Peripheral vascular disease ([place])      H/O R SFA stent, L Iliac Stent 1996;;  Lower Extremity Angiography April 2015: Occluded left SFA reconstituting at [place] of the canal, heavily calcified. Three-vessel runoff..;; RCA 50% ostial right common iliac, patent SFA stent -> Referred to Dr. Trula Slade of [facility].  . Shortness of breath     Chronic  . Hypertension   . Dyslipidemia, goal LDL below 70   . Olecranon bursitis of left elbow   . Trigger ring finger of left hand   . Osteoarthritis   . Bradycardia   . GERD (gastroesophageal reflux disease)   . Former heavy tobacco smoker      quit in October 2014  . Atrial fibrillation ([place])   . Tubular adenoma of colon   . VT (ventricular tachycardia) ([place])     hemodynamically  significant VT on 11/07/2015, nonobstructive CAD, no culprit lesion, underwent St Jude Dual Chamber ICD by Dr. [name]     Surgical History:  Past Surgical History  Procedure Laterality Date  . Tonsillectomy      as child  . Right hand surgery    . Trigger finger release  07/23/2011    Procedure: RELEASE TRIGGER FINGER/A-1 PULLEY;   Surgeon: Lorn Junes, MD;  Location: [place];  Service: Orthopedics;  Laterality: Left;  release trigger left ring finger patient had supraclavicular block in preop  . Olecranon bursectomy  07/23/2011    Procedure: OLECRANON BURSA;  Surgeon: Lorn Junes, MD;  Location: [place];  Service: Orthopedics;  Laterality: Left;  excision olecranon bursa left elbow patient had supraclaviular block in preop  . Coronary angioplasty  1993    Circumflex x 2   . Rotator cuff repair Right   . Aortic valve replacement  02/10/2013    Dr. Servando Snare: [address] Ease Pericardia  . Iliac artery stent Left 1996  . Superficial femoral atery stent Right 1996  . Aortic valve replacement N/A 02/10/2013    AORTIC VALVE REPLACEMENT (AVR);  Surgeon: Grace Isaac, MD;  Location: [place];  Service: Open Heart Surgery;  Laterality: N/A  . Intraoperative transesophageal echocardiogram N/A 02/10/2013    Procedure: INTRAOPERATIVE TRANSESOPHAGEAL ECHOCARDIOGRAM;  Surgeon: Grace Isaac, MD;  Location: [place];  Service: Open Heart Surgery;  Laterality: N/A;  . Transthoracic echocardiogram  03/23/2013    Post-Op Echo #1: EF 50-55%, Mild Conc LVH, Mild HK of Inferolateral wall with paradoxical septal motion (c/w post-op state); Well seated 23 mm Bioprosthetic Valve (area ~1.23 cm2 - normal for valve); Mild MR  . Cardiac catheterization  August 2014    Nonobstructive 30-50% RCA. No significant LAD disease. 30% mid circumflex.  . Peripheral vascular angiogram  07/19/1994    No intervention-recommend medical therapy  . Peripheral vascular angiogram  01/17/1995    Right SFA 80-90# lesion, dilatation performed with a Cordis "Optz 5" 35m x 2cm, inflated a 4-7.5atm for 40 to 70 seconds. Completion angiogram done by hand with "bolus chase" runoff from Primal SFA to foot. [place] in reduction of 80-90% to 20% with excellent flow and without dissection  . Arterial doppler - pre-cabg  02/06/2013     Bilateral 1-39% ICA stenosis  . Cardiac catheterization  11/10/2009    No intervention - recommend medical management  . Cardiovascular stress test  06/24/2012    Diffuse LV hypokinesis with moderately depressed systolic function and EF 382%  . Peripheral vascular angiogram  09/10/2013    Occluded left SFA, heavily calcified with reconstitution at adductor canal. Three-vessel runof.;; Right lower extremity noted 50% ostial right common iliac stenosis with roughly 20 mm gradient. Not considered significant.f  . Left and right heart catheterization with coronary angiogram N/A 01/28/2013    Procedure: LEFT AND RIGHT HEART CATHETERIZATION WITH CORONARY ANGIOGRAM;  Surgeon: DLeonie Man MD;  [facility];  Service: Cardiovascular;  Laterality: N/A;  . Lower extremity angiogram N/A 09/10/2013    Procedure: LOWER EXTREMITY ANGIOGRAM;  Surgeon: JLorretta Harp MD;  [facility];  Service: Cardiovascular;  Laterality: N/A;  . Abdominal angiogram  09/10/2013    Procedure: ABDOMINAL ANGIOGRAM;  Surgeon: JLorretta Harp MD;  [facility];  Service: Cardiovascular;;  . Transthoracic echocardiogram  March 2017    EF 50-55%. No RWMA.  GR 1 DD.  2[place]  mm bioprosthetic valve functioning normally.  Mild biatrial dilation.  . Cardiac catheterization N/A 11/09/2015    Procedure: Left Heart Cath and Coronary Angiography;  Surgeon: Belva Crome, MD;  Location: [place] CV LAB;  Service: Cardiovascular;  Laterality: N/A;  . Ep implantable device N/A 11/10/2015    Procedure: ICD Implant;  Surgeon: Evans Lance, MD;  Location: [place] [facility] LAB;  Service: Cardiovascular;  Laterality: N/A;     Home Meds: Prior to Admission medications   Medication Sig Start Date Esmeralda Malay Date Taking? Authorizing Provider  amLODipine (NORVASC) 10 MG tablet Take 1 tablet (10 mg total) by mouth daily. 09/29/15  Yes Leonie Man, MD  aspirin 81 MG EC tablet Take 1 tablet (81 mg total) by mouth daily. 11/11/15  Yes  Renee Dyane Dustman, PA-C  atorvastatin (LIPITOR) 80 MG tablet Take 1 tablet (80 mg total) by mouth daily. 09/29/15  Yes Leonie Man, MD  cilostazol (PLETAL) 100 MG tablet Take 1 tablet (100 mg total) by mouth 2 (two) times daily. 09/29/15  Yes Leonie Man, MD  clopidogrel (PLAVIX) 75 MG tablet Take 1 tablet (75 mg total) by mouth daily. 11/11/15  Yes Renee Dyane Dustman, PA-C  ezetimibe (ZETIA) 10 MG tablet Take 1 tablet (10 mg total) by mouth daily. 09/29/15  Yes Leonie Man, MD  fish oil-omega-3 fatty acids 1000 MG capsule Take 1 g by mouth 2 (two) times daily.    Yes Historical Provider, MD  hydrochlorothiazide (MICROZIDE) 12.5 MG capsule Take 1 capsule (12.5 mg total) by mouth daily. 09/29/15  Yes Leonie Man, MD  lisinopril (PRINIVIL) 10 MG tablet Take 1 tablet (10 mg total) by mouth daily. 11/11/15  Yes Renee Dyane Dustman, PA-C  metoprolol tartrate (LOPRESSOR) 25 MG tablet Take 0.5 tablets (12.5 mg total) by mouth 2 (two) times daily. 02/07/15  Yes Leonie Man, MD  Multiple Vitamins-Minerals (MULTIVITAMIN WITH MINERALS) tablet Take 1 tablet by mouth daily.   Yes Historical Provider, MD  vitamin E 400 UNIT capsule Take 2 capsules by mouth daily.   Yes Historical Provider, MD    Allergies: No Known Allergies  Social History   Social History  . Marital Status: Married    Spouse Name: N/A  . Number of Children: N/A  . Years of Education: N/A   Occupational History  . Not on file.   Social History Main Topics  . Smoking status: Former Smoker -- 0.50 packs/day for 60 years    Types: Cigarettes    Quit date: 01/31/2013  . Smokeless tobacco: Never Used  . Alcohol Use: No  . Drug Use: No  . Sexual Activity: Not on file   Other Topics Concern  . Not on file   Social History Narrative   Married with no children. He long-term smoker who quit prior to his aVR in 2014.    Up until his operation he worked part-time at [employer].  He is not yet gone back to work  postoperatively.   He chose not be cardiac rehabilitation, but has gone back to walking exercising daily.     Family History  Problem Relation Age of Onset  . Hypertension Mother   . Cancer Mother     Oral  . Stroke Father     Review of Systems: A comprehensive 12 system review of systems performed and was negative, except as noted in the history of present illness.  Labs:   Lab Results  Component Value Date  WBC 6.1 11/29/2015   HGB 12.7* 11/29/2015   HCT 38.0* 11/29/2015   MCV 90.7 11/29/2015   PLT 206 11/29/2015     Recent Labs Lab 11/29/15 2017  NA 135  K 4.4  CL 104  CO2 23  BUN 30*  CREATININE 1.72*  CALCIUM 10.2  PROT 6.6  BILITOT 0.6  ALKPHOS 73  ALT 19  AST 35  GLUCOSE 280*   No results for input(s): CKTOTAL, CKMB, TROPONINI in the last 72 hours. Lab Results  Component Value Date   CHOL 178 02/07/2015   HDL 51 02/07/2015   LDLCALC 99 02/07/2015   TRIG 141 02/07/2015   No results found for: DDIMER  Radiology/Studies:  Dg Chest 2 View  11/11/2015  CLINICAL DATA:  AICD. EXAM: CHEST  2 VIEW COMPARISON:  11/07/2015 . FINDINGS: Interim placement AICD, lead tips noted over the right atrium right ventricle. Cardiac valve replacement. Heart size stable. Low lung volumes with mild bibasilar atelectasis. No pleural effusion or pneumothorax. IMPRESSION: 1. Replacement AICD, lead tips in right atrium right ventricle. Prior cardiac valve replacement. Heart size normal. 2.  Low lung volumes with mild bibasilar atelectasis. Electronically Signed   By: Marcello Moores  Register   On: 11/11/2015 07:58   Dg Chest Portable 1 View  11/29/2015  CLINICAL DATA:  AICD fired 5 times tonight. No chest pain or shortness of breath. EXAM: PORTABLE CHEST 1 VIEW COMPARISON:  11/11/2015 FINDINGS: The pacer wires/ AICD appears stable. Stable surgical changes from aortic valve replacement surgery. Stable borderline cardiac size. Stable tortuous calcified thoracic aorta. The lungs are clear.  The bony thorax is intact. IMPRESSION: No acute cardiopulmonary findings. Stable position of the pacer wires/ AICD. Stable tortuous calcified thoracic aorta. Electronically Signed   By: Marijo Sanes M.D.   On: 11/29/2015 21:44   Dg Chest Portable 1 View  11/08/2015  CLINICAL DATA:  Arrhythemia tonight.  Shortness of breath. EXAM: PORTABLE CHEST 1 VIEW COMPARISON:  03/12/2013 FINDINGS: Patient is post median sternotomy. Borderline cardiomegaly. No pulmonary edema. Mild hyperinflation and chronic interstitial markings. Scarring at the left lung base. No confluent airspace disease, large pleural effusion or pneumothorax. Overlying monitoring devices in place. IMPRESSION: 1. Borderline cardiomegaly.  No pulmonary edema. 2. Mild hyperinflation and chronic interstitial markings. Electronically Signed   By: Jeb Levering M.D.   On: 11/08/2015 00:10   Wt Readings from Last 3 Encounters:  11/29/15 63.504 kg (140 lb)  11/23/15 64.465 kg (142 lb 1.9 oz)  11/11/15 65.273 kg (143 lb 14.4 oz)    EKG: Sinus tachycardia, first-degree AV block, right axis deviation, and right bundle branch block.  Heart rate is slightly increased from prior tracing.  Otherwise, there is been no significant change since 11/11/15.  ICD interrogation: Device: St. Jude Medical Ellipse DR [id number] Mode: DDD, base rate 60 bpm, max tracking rate 120 bpm AP: 14% VP: <1%  VT-1: 160 bpm; monitor only VT-2: 181 bpm; ATP x 2, ATP x 2, 30J, 36J x 2 VF: 222 bpm; ATP x 1, 30J, 36J, 36J x 4 Events:  11/29/15: 1 VT-1 and 6 VT-2 episodes; total of 18 ATP attempts and 5 shocks (30J) 11/23/15: 1 VT-1 and 2 VT-2 episodes; total of 6 ATP attempts and no shocks  Physical Exam: Blood pressure 126/65, pulse 93, temperature 98.1 F (36.7 C), temperature source Oral, resp. rate 17, height '5\' 5"'$  (1.651 m), weight 63.504 kg (140 lb), SpO2 94 %. Body mass index is 23.3 kg/(m^2). General: Well developed, well nourished,  in no acute distress.  He is  accompanied by his wife, brother, and brother-in-law. Head: Normocephalic, atraumatic, sclera non-icteric, no xanthomas, nares are without discharge.  Neck: Negative for carotid bruits.  JVP is approximately 8 cm without HJR. Lungs: Clear bilaterally to auscultation without wheezes, rales, or rhonchi. Breathing is unlabored. Heart: RRR with S1 S2. No murmurs, rubs, or gallops appreciated. Abdomen: Soft, non-tender, non-distended with normoactive bowel sounds. No hepatomegaly. No rebound/guarding. No obvious abdominal masses. Msk:  Strength and tone appear normal for age. Extremities: No clubbing or cyanosis. No edema.  Pedal pulses are 1+ bilaterally. Neuro: Alert and oriented X 3. No focal deficit. No facial asymmetry. Moves all extremities spontaneously. Psych:  Responds to questions appropriately with a normal affect. Skin: Left anterior chest ICD incision site is well-healed with pink granulation tissue.  No erythemia or discharge.    Assessment and Plan  [name] is a 81 year old man with history of CAD, mixed ischemic/nonischemic cardiomyopathy (normalization of LVEF), and inducible VT status post dual-chamber ICD almost 3 weeks ago, who presents after 5 ICD shocks tonight.  Not all rhythm strips were provided for review.  However, the most recent episode is consistent with sustained ventricular tachycardia.  Ventricular tachycardia: The patient has had sustained episodes falling in the VT-1 and VT-2 zones on 2 occasions since ICD implantation less than 3 weeks ago.  He does not have any other symptoms to suggest worsening coronary insufficiency.  He is currently in sinus rhythm on amiodarone infusion.  His labs today are notable for borderline low magnesium but otherwise no significant electrolyte abnormalities to explain his arrhythmia.  He has some possibilities for medical optimization, including up titration of beta blockade.  The patient and his wife note, however, that Mr. Edgell had  some fatigue and nervousness with higher doses of metoprolol shortly after his aortic valve surgery.  Admit to step down with telemetry under observation status.  Continue amiodarone infusion pending EP evaluation in the morning.  We will plan to escalate beta-blockade, as patient is currently only on metoprolol tartrate 12.5 mg twice a day.  I discussed increasing metoprolol versus switching to an alternative beta blocker with the patient and his family; Mr. Minish wished to increase metoprolol first.  We'll increase to metoprolol tartrate 25 mg twice a day starting tonight, with further titration as blood pressure allows.  Trend troponins, which will presumably be mildly positive given multiple ICD shocks today.  However, significant rise in troponins would suggest ongoing myocardial ischemia as etiology of VT.  Continue to monitor electrolytes closely.  Will administer magnesium sulfate to maintain magnesium greater than 2.0.  Coronary artery disease: No new symptoms to suggest worsening coronary insufficiency.  Recent cardiac catheterization revealed 70% distal RCA stenosis with otherwise moderate three-vessel CAD.  Continue current medication regimen for secondary prevention, including dual antiplatelet therapy given recent and STEMI in the setting of VT.  Patient should also remain on high potency statin and ezetimibe, which he seems to be tolerating well.  Trend troponins, as above.  If significant troponin rise occurs suggestive of ongoing ischemia, patient may need repeat coronary angiography in the setting of multiple episodes of VT.  Hypertension: Blood pressure is normal today.  Patient has slightly elevated creatinine, which may reflect mild intravascular volume depletion given recent diarrhea.  Continue amlodipine.  Up titrate metoprolol, as above.  Hold lisinopril and HCTZ in the setting of mild dehydration and elevated creatinine in the setting of CKD.  Chronic kidney  disease stage III: Creatinine is slightly above baseline this evening, which may be reflective of recent diarrhea and intravascular volume depletion.  Gentle hydration with normal saline.  Hold lisinopril and HCTZ, as above.  Hyperglycemia: Patient noted to have a glucose of 280, though he does not carry a diagnosis of diabetes.  A1c was noted to be 6.3 in 01/2013.  Check hemoglobin A1c  Sliding-scale insulin  Diet:  NPO after midnight should invasive procedure be necessary in the morning.  CODE STATUS:  Full code  Medical decision maker: Wife  Signed, Nelva Bush MD  11/29/2015, 10:49 PM

## 2015-11-29 NOTE — ED Notes (Signed)
Interrogation complete x2. Contacting [facility] via phone for [employer]

## 2015-11-29 NOTE — ED Notes (Signed)
Re-interrogating AICD, no changes, alert, NAD, calm, interactive, denies pain or other sx. Family x2 at [facility]. Amio infusing. VSS. NSR with occasional PVCs.

## 2015-11-29 NOTE — ED Notes (Addendum)
Call received from [place] rep, "pt shocked x5" (PTA), speaking with Dr. Jarold Song. No changes, denies pain or other sx, no dyspnea noted, skin W&D. Alert, NAD, calm, interactive. Family at [facility] x2.

## 2015-11-29 NOTE — ED Notes (Addendum)
Dr. Metro Kung at [facility], EMS amio bolus complete, no changes, (denies: pain, sob, nausea, dizziness, weakness, palpitations or othber sx). Attempting interrogation at this time.

## 2015-11-29 NOTE — ED Notes (Signed)
Per EMS:  Pt from home.  defib fired 2x PTA.  EMS reports defib firing [id number].  Pt noted to be in v-tach.  BP remained stable during episode.  Denies CP, SOB, dizziness, Started on amio-'300mg'$  given by EMS.  Pacemaker placed 19 days ago-St Jude pacemaker.

## 2015-11-29 NOTE — ED Provider Notes (Signed)
CSN: [id number]     Arrival date & time 11/29/15  2006 History   First MD Initiated Contact with Patient 11/29/15 2021     Chief Complaint  Patient presents with  . AICD Problem   HPI Patient states he was in his usual state of health when today he felt his ICD shock him 6 times. Event occurred approximately one hour and a half ago area patient was resting in no acute distress when he fell headedness device shock him. It subsequently shocked him 5 more times. He called EMS who found patient in V. tach and administrated 150 bolus of amiodarone 2. Patient's EKG in route also with bundle branch and ST elevation. Patient denies any chest pain or shortness of breath. He denies any presyncope and syncope and denies any diaphoresis or feeling ill otherwise.  Seen on 6/14 by Cardiology: Ralph Drake is a 81 y.o. male who presents today for post hospital follow-up. He has PMH of CAD (coronary angioplasty x 2 1993), severe AS s/p 23 Edwards Magna Ease Pericardial Tissue Valve 2014, PAD (SFA stent 1996, SFA occlusion 2015), HTN, HLD, bradycardia, known solitary kidney with baseline stage III CKD, and h/o post op PAF without reccurence and not on AC. His last echocardiogram obtained on 08/15/2015 showed EF 50-55%, 23 mm bioprosthesis present in the aortic valve and functioning normally, grade 1 diastolic dysfunction. He was recently admitted from 5/30-6/2 after admitted with sustained VT. This strip showed a regular wide complex acardia resolved after 2 amiodarone bolus. Patient was seen by Dr. [name] on 11/08/2015, given hemodynamically unstable VT that terminated with IV amiodarone, was felt the patient needed ischemic event to rule out underlying cause. He underwent cardiac catheterization on 11/09/2015 which showed 50% proximal left circumflex lesion, 40% mid LAD lesion, 70% distal RCA lesion, 40% mid RCA lesion. There was no obvious culprit for VT based on coronary anatomy unless there was thrombosis of the RCA  with reperfusion however this is unlikely given the very trivial increase in the troponin. Due to lack of obvious cause, patient underwent St. Jude dual-chamber ICD implantation by Dr. [name] on 11/10/2015. He was eventually discharged on 11/11/2015, prior to the discharge, his potassium was elevated, his lisinopril was decreased to half. He was recommended to start Plavix for his CAD, this was started today after discharge.   Past Medical History  Diagnosis Date  . CAD S/P percutaneous coronary angioplasty 1993    POBA to Cx x 2 occasions; mild to moderate/nonobstructive CAD on preop cath August 2014  . Severe aortic stenosis by prior echocardiogram     per Dr. Rollene Fare 2012; referred for aortic valve replacement  . S/P AVR (aortic valve replacement) 02/10/2013    23 Edwards Magna Ease Pericardial Tissue Valve  . Peripheral vascular disease ([place])      H/O R SFA stent, L Iliac Stent 1996;;  Lower Extremity Angiography April 2015: Occluded left SFA reconstituting at end of the canal, heavily calcified. Three-vessel runoff..;; RCA 50% ostial right common iliac, patent SFA stent -> Referred to Dr. Trula Slade of [facility].  . Shortness of breath     Chronic  . Hypertension   . Dyslipidemia, goal LDL below 70   . Olecranon bursitis of left elbow   . Trigger ring finger of left hand   . Osteoarthritis   . Bradycardia   . GERD (gastroesophageal reflux disease)   . Former heavy tobacco smoker      quit in October 2014  .  Atrial fibrillation ([place])   . Tubular adenoma of colon   . VT (ventricular tachycardia) ([place])     hemodynamically significant VT on 11/07/2015, nonobstructive CAD, no culprit lesion, underwent St Jude Dual Chamber ICD by Dr. [name]   Past Surgical History  Procedure Laterality Date  . Tonsillectomy      as child  . Right hand surgery    . Trigger finger release  07/23/2011    Procedure: RELEASE TRIGGER FINGER/A-1 PULLEY;  Surgeon: Lorn Junes, MD;  Location: [facility];  Service: Orthopedics;  Laterality: Left;  release trigger left ring finger patient had supraclavicular block in preop  . Olecranon bursectomy  07/23/2011    Procedure: OLECRANON BURSA;  Surgeon: Lorn Junes, MD;  Location: [facility];  Service: Orthopedics;  Laterality: Left;  excision olecranon bursa left elbow patient had supraclaviular block in preop  . Coronary angioplasty  1993    Circumflex x 2   . Rotator cuff repair Right   . Aortic valve replacement  02/10/2013    Dr. Servando Snare: [address] Ease Pericardia  . Iliac artery stent Left 1996  . Superficial femoral atery stent Right 1996  . Aortic valve replacement N/A 02/10/2013    AORTIC VALVE REPLACEMENT (AVR);  Surgeon: Grace Isaac, MD;  Location: [facility];  Service: Open Heart Surgery;  Laterality: N/A  . Intraoperative transesophageal echocardiogram N/A 02/10/2013    Procedure: INTRAOPERATIVE TRANSESOPHAGEAL ECHOCARDIOGRAM;  Surgeon: Grace Isaac, MD;  Location: [facility];  Service: Open Heart Surgery;  Laterality: N/A;  . Transthoracic echocardiogram  03/23/2013    Post-Op Echo #1: EF 50-55%, Mild Conc LVH, Mild HK of Inferolateral wall with paradoxical septal motion (c/w post-op state); Well seated 23 mm Bioprosthetic Valve (area ~1.23 cm2 - normal for valve); Mild MR  . Cardiac catheterization  August 2014    Nonobstructive 30-50% RCA. No significant LAD disease. 30% mid circumflex.  . Peripheral vascular angiogram  07/19/1994    No intervention-recommend medical therapy  . Peripheral vascular angiogram  01/17/1995    Right SFA 80-90# lesion, dilatation performed with a Cordis "Optz 5" 71m x 2cm, inflated a 4-7.5atm for 40 to 70 seconds. Completion angiogram done by hand with "bolus chase" runoff from Primal SFA to foot. [place] in reduction of 80-90% to 20% with excellent flow and without dissection  . Arterial doppler - pre-cabg  02/06/2013    Bilateral 1-39% ICA stenosis  . Cardiac catheterization   11/10/2009    No intervention - recommend medical management  . Cardiovascular stress test  06/24/2012    Diffuse LV hypokinesis with moderately depressed systolic function and EF 310%  . Peripheral vascular angiogram  09/10/2013    Occluded left SFA, heavily calcified with reconstitution at adductor canal. Three-vessel runof.;; Right lower extremity noted 50% ostial right common iliac stenosis with roughly 20 mm gradient. Not considered significant.f  . Left and right heart catheterization with coronary angiogram N/A 01/28/2013    Procedure: LEFT AND RIGHT HEART CATHETERIZATION WITH CORONARY ANGIOGRAM;  Surgeon: DLeonie Man MD;  [facility];  Service: Cardiovascular;  Laterality: N/A;  . Lower extremity angiogram N/A 09/10/2013    Procedure: LOWER EXTREMITY ANGIOGRAM;  Surgeon: JLorretta Harp MD;  [facility];  Service: Cardiovascular;  Laterality: N/A;  . Abdominal angiogram  09/10/2013    Procedure: ABDOMINAL ANGIOGRAM;  Surgeon: JLorretta Harp MD;  [facility];  Service: Cardiovascular;;  . Transthoracic  echocardiogram  March 2017    EF 50-55%. No RWMA.  GR 1 DD.  23 mm bioprosthetic valve functioning normally.  Mild biatrial dilation.  . Cardiac catheterization N/A 11/09/2015    Procedure: Left Heart Cath and Coronary Angiography;  Surgeon: Belva Crome, MD;  Location: [facility] CV LAB;  Service: Cardiovascular;  Laterality: N/A;  . Ep implantable device N/A 11/10/2015    Procedure: ICD Implant;  Surgeon: Evans Lance, MD;  Location: [facility] [facility] LAB;  Service: Cardiovascular;  Laterality: N/A;   Family History  Problem Relation Age of Onset  . Hypertension Mother   . Cancer Mother     Oral  . Stroke Father    Social History  Substance Use Topics  . Smoking status: Former Smoker -- 0.50 packs/day for 60 years    Types: Cigarettes    Quit date: 01/31/2013  . Smokeless tobacco: Never Used  . Alcohol Use: No    Review of Systems   Constitutional: Negative for fever.  Allergic/Immunologic: Negative for immunocompromised state.  All other systems reviewed and are negative.  Allergies  Review of patient's allergies indicates no known allergies.  Home Medications   Prior to Admission medications   Medication Sig Start Date End Date Taking? Authorizing Provider  amLODipine (NORVASC) 10 MG tablet Take 1 tablet (10 mg total) by mouth daily. 09/29/15  Yes Leonie Man, MD  aspirin 81 MG EC tablet Take 1 tablet (81 mg total) by mouth daily. 11/11/15  Yes Renee Dyane Dustman, PA-C  atorvastatin (LIPITOR) 80 MG tablet Take 1 tablet (80 mg total) by mouth daily. 09/29/15  Yes Leonie Man, MD  cilostazol (PLETAL) 100 MG tablet Take 1 tablet (100 mg total) by mouth 2 (two) times daily. 09/29/15  Yes Leonie Man, MD  clopidogrel (PLAVIX) 75 MG tablet Take 1 tablet (75 mg total) by mouth daily. 11/11/15  Yes Renee Dyane Dustman, PA-C  ezetimibe (ZETIA) 10 MG tablet Take 1 tablet (10 mg total) by mouth daily. 09/29/15  Yes Leonie Man, MD  fish oil-omega-3 fatty acids 1000 MG capsule Take 1 g by mouth 2 (two) times daily.    Yes Historical Provider, MD  hydrochlorothiazide (MICROZIDE) 12.5 MG capsule Take 1 capsule (12.5 mg total) by mouth daily. 09/29/15  Yes Leonie Man, MD  lisinopril (PRINIVIL) 10 MG tablet Take 1 tablet (10 mg total) by mouth daily. 11/11/15  Yes Renee Dyane Dustman, PA-C  metoprolol tartrate (LOPRESSOR) 25 MG tablet Take 0.5 tablets (12.5 mg total) by mouth 2 (two) times daily. 02/07/15  Yes Leonie Man, MD  Multiple Vitamins-Minerals (MULTIVITAMIN WITH MINERALS) tablet Take 1 tablet by mouth daily.   Yes Historical Provider, MD  vitamin E 400 UNIT capsule Take 2 capsules by mouth daily.   Yes Historical Provider, MD   BP 132/82 mmHg  Pulse 99  Temp(Src) 98.1 F (36.7 C) (Oral)  Resp 18  Ht '5\' 5"'$  (1.651 m)  Wt 63.504 kg  BMI 23.30 kg/m2  SpO2 93% Physical Exam  Constitutional: He is oriented to  person, place, and time. He appears well-developed and well-nourished. No distress.  HENT:  Head: Normocephalic and atraumatic.  Eyes: Conjunctivae are normal. Right eye exhibits no discharge. Left eye exhibits no discharge.  Neck: Normal range of motion. No JVD present.  Cardiovascular: Normal rate, regular rhythm and normal heart sounds.   No murmur heard. Pulmonary/Chest: Breath sounds normal. No respiratory distress. He exhibits no tenderness.  Abdominal: Soft.  Bowel sounds are normal. He exhibits no distension.  Musculoskeletal: Normal range of motion. He exhibits no edema.  Neurological: He is alert and oriented to person, place, and time.  Skin: Skin is warm. He is not diaphoretic.  Nursing note and vitals reviewed.    ED Course  Procedures (including critical care time) Labs Review Labs Reviewed  CBC - Abnormal; Notable for the following:    RBC 4.19 (*)    Hemoglobin 12.7 (*)    HCT 38.0 (*)    All other components within normal limits  COMPREHENSIVE METABOLIC PANEL - Abnormal; Notable for the following:    Glucose, Bld 280 (*)    BUN 30 (*)    Creatinine, Ser 1.72 (*)    GFR calc non Af Amer 36 (*)    GFR calc Af Amer 42 (*)    All other components within normal limits  MAGNESIUM  PHOSPHORUS  I-STAT TROPOININ, ED  I-STAT TROPOININ, ED    Imaging Review Dg Chest Portable 1 View  11/29/2015  CLINICAL DATA:  AICD fired 5 times tonight. No chest pain or shortness of breath. EXAM: PORTABLE CHEST 1 VIEW COMPARISON:  11/11/2015 FINDINGS: The pacer wires/ AICD appears stable. Stable surgical changes from aortic valve replacement surgery. Stable borderline cardiac size. Stable tortuous calcified thoracic aorta. The lungs are clear. The bony thorax is intact. IMPRESSION: No acute cardiopulmonary findings. Stable position of the pacer wires/ AICD. Stable tortuous calcified thoracic aorta. Electronically Signed   By: Marijo Sanes M.D.   On: 11/29/2015 21:44   I have  personally reviewed and evaluated these images and lab results as part of my medical decision-making.   EKG Interpretation   Date/Time:  Tuesday November 29 2015 20:10:55 EDT Ventricular Rate:  102 PR Interval:    QRS Duration: 153 QT Interval:  365 QTC Calculation: 476 R Axis:   -74 Text Interpretation:  Sinus or ectopic atrial tachycardia Right bundle  branch block LVH with IVCD and secondary repol abnrm Borderline prolonged  QT interval Confirmed by Ashok Cordia  MD, Lennette Bihari ([id number]) on 11/29/2015 8:17:45 PM      MDM   Final diagnoses:  V-tach ([place])    Intergrade device and patient has had 5 episodes of ventricular tachycardia. Now status post amiodarone bolus and on an amiodarone drip. Consult to cardiology for admission. Patient completely asymptomatic and blood pressure is stable. Currently in sinus rhythm. Patient family updated.    Karma Greaser, MD 11/29/15 [id number]  Lajean Saver, MD 11/29/15 [phone]

## 2015-11-29 NOTE — ED Notes (Signed)
Interrogation transmitted, pending response/ fax. EDP aware.

## 2015-11-30 ENCOUNTER — Other Ambulatory Visit: Payer: Self-pay

## 2015-11-30 ENCOUNTER — Encounter (HOSPITAL_COMMUNITY): Payer: Self-pay | Admitting: *Deleted

## 2015-11-30 DIAGNOSIS — Z9861 Coronary angioplasty status: Secondary | ICD-10-CM | POA: Diagnosis not present

## 2015-11-30 DIAGNOSIS — Z87891 Personal history of nicotine dependence: Secondary | ICD-10-CM | POA: Diagnosis not present

## 2015-11-30 DIAGNOSIS — I440 Atrioventricular block, first degree: Secondary | ICD-10-CM | POA: Diagnosis present

## 2015-11-30 DIAGNOSIS — Z95828 Presence of other vascular implants and grafts: Secondary | ICD-10-CM | POA: Diagnosis not present

## 2015-11-30 DIAGNOSIS — E785 Hyperlipidemia, unspecified: Secondary | ICD-10-CM | POA: Diagnosis present

## 2015-11-30 DIAGNOSIS — Z953 Presence of xenogenic heart valve: Secondary | ICD-10-CM | POA: Diagnosis not present

## 2015-11-30 DIAGNOSIS — Q600 Renal agenesis, unilateral: Secondary | ICD-10-CM | POA: Diagnosis not present

## 2015-11-30 DIAGNOSIS — I472 Ventricular tachycardia: Secondary | ICD-10-CM | POA: Diagnosis present

## 2015-11-30 DIAGNOSIS — R579 Shock, unspecified: Secondary | ICD-10-CM | POA: Diagnosis present

## 2015-11-30 DIAGNOSIS — Z7982 Long term (current) use of aspirin: Secondary | ICD-10-CM | POA: Diagnosis not present

## 2015-11-30 DIAGNOSIS — I130 Hypertensive heart and chronic kidney disease with heart failure and stage 1 through stage 4 chronic kidney disease, or unspecified chronic kidney disease: Secondary | ICD-10-CM | POA: Diagnosis present

## 2015-11-30 DIAGNOSIS — Z8249 Family history of ischemic heart disease and other diseases of the circulatory system: Secondary | ICD-10-CM | POA: Diagnosis not present

## 2015-11-30 DIAGNOSIS — N183 Chronic kidney disease, stage 3 (moderate): Secondary | ICD-10-CM | POA: Diagnosis present

## 2015-11-30 DIAGNOSIS — I4510 Unspecified right bundle-branch block: Secondary | ICD-10-CM | POA: Diagnosis present

## 2015-11-30 DIAGNOSIS — R739 Hyperglycemia, unspecified: Secondary | ICD-10-CM | POA: Diagnosis present

## 2015-11-30 DIAGNOSIS — I739 Peripheral vascular disease, unspecified: Secondary | ICD-10-CM | POA: Diagnosis present

## 2015-11-30 DIAGNOSIS — I5022 Chronic systolic (congestive) heart failure: Secondary | ICD-10-CM | POA: Diagnosis present

## 2015-11-30 DIAGNOSIS — I2510 Atherosclerotic heart disease of native coronary artery without angina pectoris: Secondary | ICD-10-CM | POA: Diagnosis present

## 2015-11-30 DIAGNOSIS — Z95810 Presence of automatic (implantable) cardiac defibrillator: Secondary | ICD-10-CM | POA: Diagnosis not present

## 2015-11-30 DIAGNOSIS — Z79899 Other long term (current) drug therapy: Secondary | ICD-10-CM | POA: Diagnosis not present

## 2015-11-30 DIAGNOSIS — E860 Dehydration: Secondary | ICD-10-CM | POA: Diagnosis present

## 2015-11-30 LAB — BASIC METABOLIC PANEL
ANION GAP: 6 (ref 5–15)
BUN: 24 mg/dL — ABNORMAL HIGH (ref 6–20)
CALCIUM: 9.8 mg/dL (ref 8.9–10.3)
CO2: 24 mmol/L (ref 22–32)
Chloride: 110 mmol/L (ref 101–111)
Creatinine, Ser: 1.48 mg/dL — ABNORMAL HIGH (ref 0.61–1.24)
GFR, EST AFRICAN AMERICAN: 50 mL/min — AB (ref >60)
GFR, EST NON AFRICAN AMERICAN: 44 mL/min — AB (ref >60)
Glucose, Bld: 122 mg/dL — ABNORMAL HIGH (ref 65–99)
POTASSIUM: 4.7 mmol/L (ref 3.5–5.1)
Sodium: 140 mmol/L (ref 135–145)

## 2015-11-30 LAB — GLUCOSE, CAPILLARY
GLUCOSE-CAPILLARY: 106 mg/dL — AB (ref 65–99)
GLUCOSE-CAPILLARY: 108 mg/dL — AB (ref 65–99)
Glucose-Capillary: 97 mg/dL (ref 65–99)
Glucose-Capillary: 117 mg/dL — ABNORMAL HIGH (ref 65–99)

## 2015-11-30 LAB — TROPONIN I
TROPONIN I: 0.12 ng/mL — AB (ref <0.031)
TROPONIN I: 0.13 ng/mL — AB (ref <0.031)
TROPONIN I: 0.18 ng/mL — AB (ref <0.031)

## 2015-11-30 LAB — MAGNESIUM: Magnesium: 2.7 mg/dL — ABNORMAL HIGH (ref 1.7–2.4)

## 2015-11-30 LAB — MRSA PCR SCREENING: MRSA by PCR: NEGATIVE

## 2015-11-30 MED ORDER — INSULIN ASPART 100 UNIT/ML ~~LOC~~ SOLN: 0.0000 [IU] | Freq: Three times a day (TID) | SUBCUTANEOUS | Status: DC

## 2015-11-30 NOTE — Consult Note (Signed)
ELECTROPHYSIOLOGY CONSULT NOTE    Patient ID: Ralph Drake MRN: [id number], DOB/AGE: 12/15/1934 81 y.o.  Admit date: 11/29/2015 Date of Consult: 11/30/2015  Primary Physician: [name], MD Primary Cardiologist: Dr. [name] Electrophysiologist: Dr. [name] Requesting MD: Dr. Saunders Revel  Reason for Consultation: VT, multiple ICD therapies  HPI: Ralph Drake is a 81 y.o. male known to our service with PMHx of VT s/p ICD implant recently, CAD, VHD with AVR (bioprosthetic), PVD, HTN, HLD, and post op in 2014 PAFib, none known otherwise was brought to [facility] via EMS after receiving multiple ICD shocks at home.  LABS: K+ 4.4 Mag 1.9 >> 2.7 BUN/Creat 30/1.72 >> 24/1.48 H/H 12/38 WBC 6.1 POC Trop 0.03, 0.00 Trop I: 0/12, 0.18  The patient reports he was feeling well, had no symptoms prior to be shocked, denies any recent CP, palpitations, no SOB, no dizziness, near syncope or syncope.  No post-shock symptoms, feels well currently.  Past Medical History  Diagnosis Date  . CAD S/P percutaneous coronary angioplasty 1993    POBA to Cx x 2 occasions; mild to moderate/nonobstructive CAD on preop cath August 2014  . Severe aortic stenosis by prior echocardiogram     per Dr. Rollene Fare 2012; referred for aortic valve replacement  . S/P AVR (aortic valve replacement) 02/10/2013    [address] Ease Pericardial Tissue Valve  . Peripheral vascular disease ([place])      H/O R SFA stent, L Iliac Stent 1996;;  Lower Extremity Angiography April 2015: Occluded left SFA reconstituting at end of the canal, heavily calcified. Three-vessel runoff..;; RCA 50% ostial right common iliac, patent SFA stent -> Referred to Dr. Trula Slade of [facility].  . Shortness of breath     Chronic  . Hypertension   . Dyslipidemia, goal LDL below 70   . Olecranon bursitis of left elbow   . Trigger ring finger of left hand   . Osteoarthritis   . Bradycardia   . GERD (gastroesophageal reflux disease)   . Former heavy tobacco  smoker      quit in October 2014  . Atrial fibrillation ([place])   . Tubular adenoma of colon   . VT (ventricular tachycardia) ([place])     hemodynamically significant VT on 11/07/2015, nonobstructive CAD, no culprit lesion, underwent St Jude Dual Chamber ICD by Dr. [name]     Surgical History:  Past Surgical History  Procedure Laterality Date  . Tonsillectomy      as child  . Right hand surgery    . Trigger finger release  07/23/2011    Procedure: RELEASE TRIGGER FINGER/A-1 PULLEY;  Surgeon: Lorn Junes, MD;  Location: [place];  Service: Orthopedics;  Laterality: Left;  release trigger left ring finger patient had supraclavicular block in preop  . Olecranon bursectomy  07/23/2011    Procedure: OLECRANON BURSA;  Surgeon: Lorn Junes, MD;  Location: [place];  Service: Orthopedics;  Laterality: Left;  excision olecranon bursa left elbow patient had supraclaviular block in preop  . Coronary angioplasty  1993    Circumflex x 2   . Rotator cuff repair Right   . Aortic valve replacement  02/10/2013    Dr. Servando Snare: [address] Ease Pericardia  . Iliac artery stent Left 1996  . Superficial femoral atery stent Right 1996  . Aortic valve replacement N/A 02/10/2013    AORTIC VALVE REPLACEMENT (AVR);  Surgeon: Grace Isaac, MD;  Location: [place];  Service: Open Heart Surgery;  Laterality: N/A  . Intraoperative transesophageal echocardiogram N/A 02/10/2013    Procedure: INTRAOPERATIVE TRANSESOPHAGEAL ECHOCARDIOGRAM;  Surgeon: Grace Isaac, MD;  Location: [place];  Service: Open Heart Surgery;  Laterality: N/A;  . Transthoracic echocardiogram  03/23/2013    Post-Op Echo #1: EF 50-55%, Mild Conc LVH, Mild HK of Inferolateral wall with paradoxical septal motion (c/w post-op state); Well seated 23 mm Bioprosthetic Valve (area ~1.23 cm2 - normal for valve); Mild MR  . Cardiac catheterization  August 2014    Nonobstructive 30-50% RCA. No significant LAD disease.  30% mid circumflex.  . Peripheral vascular angiogram  07/19/1994    No intervention-recommend medical therapy  . Peripheral vascular angiogram  01/17/1995    Right SFA 80-90# lesion, dilatation performed with a Cordis "Optz 5" 24m x 2cm, inflated a 4-7.5atm for 40 to 70 seconds. Completion angiogram done by hand with "bolus chase" runoff from Primal SFA to foot. [place] in reduction of 80-90% to 20% with excellent flow and without dissection  . Arterial doppler - pre-cabg  02/06/2013    Bilateral 1-39% ICA stenosis  . Cardiac catheterization  11/10/2009    No intervention - recommend medical management  . Cardiovascular stress test  06/24/2012    Diffuse LV hypokinesis with moderately depressed systolic function and EF 370%  . Peripheral vascular angiogram  09/10/2013    Occluded left SFA, heavily calcified with reconstitution at adductor canal. Three-vessel runof.;; Right lower extremity noted 50% ostial right common iliac stenosis with roughly 20 mm gradient. Not considered significant.f  . Left and right heart catheterization with coronary angiogram N/A 01/28/2013    Procedure: LEFT AND RIGHT HEART CATHETERIZATION WITH CORONARY ANGIOGRAM;  Surgeon: DLeonie Man MD;  [facility];  Service: Cardiovascular;  Laterality: N/A;  . Lower extremity angiogram N/A 09/10/2013    Procedure: LOWER EXTREMITY ANGIOGRAM;  Surgeon: JLorretta Harp MD;  [facility];  Service: Cardiovascular;  Laterality: N/A;  . Abdominal angiogram  09/10/2013    Procedure: ABDOMINAL ANGIOGRAM;  Surgeon: JLorretta Harp MD;  [facility];  Service: Cardiovascular;;  . Transthoracic echocardiogram  March 2017    EF 50-55%. No RWMA.  GR 1 DD.  23 mm bioprosthetic valve functioning normally.  Mild biatrial dilation.  . Cardiac catheterization N/A 11/09/2015    Procedure: Left Heart Cath and Coronary Angiography;  Surgeon: HBelva Crome MD;  [place];  Service: Cardiovascular;   Laterality: N/A;  . Ep implantable device N/A 11/10/2015    Procedure: ICD Implant;  Surgeon: GEvans Lance MD;  [facility];  Service: Cardiovascular;  Laterality: N/A;     Prescriptions prior to admission  Medication Sig Dispense Refill Last Dose  . amLODipine (NORVASC) 10 MG tablet Take 1 tablet (10 mg total) by mouth daily. 90 tablet 3 11/29/2015 at Unknown time  . aspirin 81 MG EC tablet Take 1 tablet (81 mg total) by mouth daily.   11/29/2015 at Unknown time  . atorvastatin (LIPITOR) 80 MG tablet Take 1 tablet (80 mg total) by mouth daily. 90 tablet 3 11/29/2015 at Unknown time  . cilostazol (PLETAL) 100 MG tablet Take 1 tablet (100 mg total) by mouth 2 (two) times daily. 180 tablet 3 11/29/2015 at Unknown time  . clopidogrel (PLAVIX) 75 MG tablet Take 1 tablet (75 mg total) by mouth daily. 30 tablet 3 11/29/2015 at Unknown time  . ezetimibe (ZETIA) 10 MG tablet Take 1 tablet (10 mg  total) by mouth daily. 90 tablet 3 11/29/2015 at Unknown time  . fish oil-omega-3 fatty acids 1000 MG capsule Take 1 g by mouth 2 (two) times daily.    11/29/2015 at Unknown time  . hydrochlorothiazide (MICROZIDE) 12.5 MG capsule Take 1 capsule (12.5 mg total) by mouth daily. 90 capsule 3 11/29/2015 at Unknown time  . lisinopril (PRINIVIL) 10 MG tablet Take 1 tablet (10 mg total) by mouth daily. 30 tablet 3 11/28/2015 at Unknown time  . metoprolol tartrate (LOPRESSOR) 25 MG tablet Take 0.5 tablets (12.5 mg total) by mouth 2 (two) times daily. 90 tablet 3 11/29/2015 at Unknown time  . Multiple Vitamins-Minerals (MULTIVITAMIN WITH MINERALS) tablet Take 1 tablet by mouth daily.   11/29/2015 at Unknown time  . vitamin E 400 UNIT capsule Take 2 capsules by mouth daily.   11/29/2015 at Unknown time    Inpatient Medications:  . amLODipine  10 mg Oral Daily  . aspirin  81 mg Oral Daily  . atorvastatin  80 mg Oral Daily  . cilostazol  100 mg Oral BID  . clopidogrel  75 mg Oral Daily  . ezetimibe  10 mg Oral  Daily  . heparin  5,000 Units Subcutaneous Q8H  . insulin aspart  0-9 Units Subcutaneous TID WC  . metoprolol tartrate  25 mg Oral BID  . multivitamin with minerals  1 tablet Oral Daily  . omega-3 acid ethyl esters  1 g Oral BID  . sodium chloride flush  3 mL Intravenous Q12H  . sodium chloride flush  3 mL Intravenous Q12H    Allergies: No Known Allergies  Social History   Social History  . Marital Status: Married    Spouse Name: N/A  . Number of Children: N/A  . Years of Education: N/A   Occupational History  . Not on file.   Social History Main Topics  . Smoking status: Former Smoker -- 0.50 packs/day for 60 years    Types: Cigarettes    Quit date: 01/31/2013  . Smokeless tobacco: Never Used  . Alcohol Use: No  . Drug Use: No  . Sexual Activity: Not on file   Other Topics Concern  . Not on file   Social History Narrative   Married with no children. He long-term smoker who quit prior to his aVR in 2014.    Up until his operation he worked part-time at [employer].  He is not yet gone back to work postoperatively.   He chose not be cardiac rehabilitation, but has gone back to walking exercising daily.     Family History  Problem Relation Age of Onset  . Hypertension Mother   . Cancer Mother     Oral  . Stroke Father      Review of Systems: All other systems reviewed and are otherwise negative except as noted above.  Physical Exam: Filed Vitals:   11/30/15 0130 11/30/15 0200 11/30/15 0235 11/30/15 0300  BP: 132/73 119/86 153/86 140/75  Pulse: 77 71    Temp:   98 F (36.7 C)   TempSrc:   Oral   Resp: '14 15 14 11  '$ Height:      Weight:      SpO2: 94% 93% 98%      GEN- The patient is well appearing, alert and oriented x 3 today.   HEENT: normocephalic, atraumatic; sclera clear, conjunctiva pink; hearing intact; oropharynx clear; neck supple, no JVP Lymph- no cervical lymphadenopathy Lungs- Clear to ausculation bilaterally, normal work  of  breathing.  No wheezes, rales, rhonchi Heart- Regular rate and rhythm, no murmurs, rubs or gallops GI- soft, non-tender, non-distended, bowel sounds present Extremities- no clubbing, cyanosis, or edema MS- no significant deformity or atrophy Skin- warm and dry, no rash or lesion Psych- euthymic mood, full affect Neuro- no gross deficits observed  ICD site is well healed, no evidence of infection, no hematoma  Labs:   Lab Results  Component Value Date   WBC 6.1 11/29/2015   HGB 12.7* 11/29/2015   HCT 38.0* 11/29/2015   MCV 90.7 11/29/2015   PLT 206 11/29/2015    Recent Labs Lab 11/29/15 2017 11/30/15 0450  NA 135 140  K 4.4 4.7  CL 104 110  CO2 23 24  BUN 30* 24*  CREATININE 1.72* 1.48*  CALCIUM 10.2 9.8  PROT 6.6  --   BILITOT 0.6  --   ALKPHOS 73  --   ALT 19  --   AST 35  --   GLUCOSE 280* 122*      Radiology/Studies:  Dg Chest Portable 1 View 11/29/2015  CLINICAL DATA:  AICD fired 5 times tonight. No chest pain or shortness of breath. EXAM: PORTABLE CHEST 1 VIEW COMPARISON:  11/11/2015 FINDINGS: The pacer wires/ AICD appears stable. Stable surgical changes from aortic valve replacement surgery. Stable borderline cardiac size. Stable tortuous calcified thoracic aorta. The lungs are clear. The bony thorax is intact. IMPRESSION: No acute cardiopulmonary findings. Stable position of the pacer wires/ AICD. Stable tortuous calcified thoracic aorta. Electronically Signed   By: Marijo Sanes M.D.   On: 11/29/2015 21:44     EKG: #1 SR, RBBB #2 AV paced TELEMETRY: A paced, V sensed 08/15/15: Echocardiogram Study Conclusions - Left ventricle: The cavity size was normal. There was mild  concentric hypertrophy. Systolic function was normal. The  estimated ejection fraction was in the range of 50% to 55%. Wall  motion was normal; there were no regional wall motion  abnormalities. Doppler parameters are consistent with abnormal  left ventricular relaxation (grade 1  diastolic dysfunction). - Aortic valve: A 23 mm bioprosthesis was present and functioning  normally. Valve area (VTI): 1.5 cm^2. Valve area (Vmax): 1.3  cm^2. Valve area (Vmean): 1.34 cm^2. - Mitral valve: Transvalvular velocity was within the normal range.  There was no evidence for stenosis. There was trivial  regurgitation. - Left atrium: The atrium was mildly dilated. - Right ventricle: The cavity size was normal. Wall thickness was  normal. - Right atrium: The atrium was mildly dilated. - Atrial septum: No defect or patent foramen ovale was identified  by color flow Doppler. - Tricuspid valve: There was trivial regurgitation. - Inferior vena cava: The vessel was normal in size. The  respirophasic diameter changes were in the normal range (= 50%),  consistent with normal central venous pressure.  11/09/15: [facility], Dr. Tamala Julian Conclusion    1. Prox Cx to Mid Cx lesion, 50% stenosed. 2. Mid LAD lesion, 40% stenosed. 3. Dist RCA lesion, 70% stenosed. 4. Mid RCA lesion, 40% stenosed.   Known solitary kidney with CKD stage III.  Moderate to moderately severe mid to distal RCA with eccentric 50-70% stenosis. Irregularities up to 40-50% noted in the mid vessel.  Moderate disease noted in the mid LAD and mid circumflex.  Left ventriculography and LV hemodynamics were not recorded due to prosthetic tissue valve which we did not cross.  RECOMMENDATIONS:   No obvious explanation for VT based upon coronary anatomy unless there was thrombosis  of the right coronary with reperfusion. This seems unlikely given the very trivial increase in troponin.  The electrophysiology service will make further recommendations.  Aggressive risk factor modification. I would also consider adding Plavix to 81 mg aspirin in the setting of ACS. Hold Plavix until EP has made file decision concerning invasive procedures/device therapy.     DEVICE HISTORY: STJ, dual chamber ICD, implanted 11/10/15, Dr.  [name], VT  Assessment and Plan:   1. VT recurrent with multiple ICD therapies     11/07/15 admitted with VT >> ICD implant     Agree with IV amiodarone, will need load, likely 48hours     VSS  2. CAD (PCI 1993)     Cath 11/09/15, stable CAD, no intervention     Trop are lower then his last admit, no c/o CP     oon ASA/Plavix, BB, statin, zetia  3. VHD s/p AVR (bioprosthetic) 2014  4. PVD, left SFA stent, 1996, occl noted 2015     On Pletal   5. CRI, stage III    Signed, Tommye Standard, PA-C 11/30/2015 9:05 AM  EP Attending  Patient seen and examined. He is well known to me from previous hospitalizations where he received and ICD for VT. He has been admitted with VT storm. His device has treated his ventricular arrhythmias appropriately. He has been placed on IV amio which is reasonable. As he did not have significant CAD at his last admit, I would not recommend an additional ischemic eval. Rather will give 48 hours of IV amio and if no more VT, switch to 400 bid and then 400 daily. His device has been interogated and demonstrated VT with multiple ATP's and 2 ICD shocks. In addition, he had VT about 2 weeks ago for which AtP terminated his arrhythmias.  Mikle Bosworth.D.

## 2015-11-30 NOTE — ED Notes (Signed)
Alert, NAD, calm, no changes, denies pain, NSR on monitor.

## 2015-11-30 NOTE — Care Management Obs Status (Signed)
[place] NOTIFICATION   Patient Details  Name: ARNAV CREGG MRN: [id number] Date of Birth: 02/21/1935   Medicare Observation Status Notification Given:  Yes    Bethena Roys, RN 11/30/2015, 11:19 AM

## 2015-12-01 LAB — GLUCOSE, CAPILLARY
GLUCOSE-CAPILLARY: 143 mg/dL — AB (ref 65–99)
Glucose-Capillary: 109 mg/dL — ABNORMAL HIGH (ref 65–99)
Glucose-Capillary: 95 mg/dL (ref 65–99)
Glucose-Capillary: 95 mg/dL (ref 65–99)

## 2015-12-01 LAB — HEMOGLOBIN A1C
HEMOGLOBIN A1C: 6.3 % — AB (ref 4.8–5.6)
Mean Plasma Glucose: 134 mg/dL

## 2015-12-01 MED ORDER — AMIODARONE HCL 200 MG PO TABS: 400.0000 mg | ORAL_TABLET | Freq: Two times a day (BID) | ORAL | Status: DC

## 2015-12-01 NOTE — Progress Notes (Signed)
SUBJECTIVE: The patient is doing well today.  At this time, he denies chest pain, shortness of breath, or any new concerns.  CURRENT MEDICATIONS: . amLODipine  10 mg Oral Daily  . aspirin  81 mg Oral Daily  . atorvastatin  80 mg Oral Daily  . cilostazol  100 mg Oral BID  . clopidogrel  75 mg Oral Daily  . ezetimibe  10 mg Oral Daily  . heparin  5,000 Units Subcutaneous Q8H  . insulin aspart  0-9 Units Subcutaneous TID WC  . metoprolol tartrate  25 mg Oral BID  . multivitamin with minerals  1 tablet Oral Daily  . omega-3 acid ethyl esters  1 g Oral BID  . sodium chloride flush  3 mL Intravenous Q12H  . sodium chloride flush  3 mL Intravenous Q12H   . amiodarone 30 mg/hr (11/30/15 2328)    OBJECTIVE: Physical Exam: Filed Vitals:   11/30/15 2020 11/30/15 2329 12/01/15 0500 12/01/15 0730  BP: 118/70 111/69 127/61 136/81  Pulse: 61 60 60 64  Temp: 98.1 F (36.7 C) 97.8 F (36.6 C) 98.1 F (36.7 C) 97.7 F (36.5 C)  TempSrc: Oral Oral Oral Axillary  Resp: '20 15 14 17  '$ Height:      Weight:   142 lb 6.4 oz (64.592 kg)   SpO2: 94% 92% 95% 100%    Intake/Output Summary (Last 24 hours) at 12/01/15 0825 Last data filed at 12/01/15 0730  Gross per 24 hour  Intake 1980.4 ml  Output   3450 ml  Net -1469.6 ml    Telemetry reveals atrial pacing with intrinsic ventricular conduction   GEN- The patient is well appearing, alert and oriented x 3 today.   Head- normocephalic, atraumatic Eyes-  Sclera clear, conjunctiva pink Ears- hearing intact Oropharynx- clear Neck- supple  Lungs- Clear to ausculation bilaterally, normal work of breathing Heart- Regular rate and rhythm  GI- soft, NT, ND, + BS Extremities- no clubbing, cyanosis, or edema Skin- no rash or lesion Psych- euthymic mood, full affect Neuro- strength and sensation are intact  LABS: Basic Metabolic Panel:  Recent Labs  11/29/15 2017 11/29/15 2105 11/30/15 0450  NA 135  --  140  K 4.4  --  4.7  CL 104   --  110  CO2 23  --  24  GLUCOSE 280*  --  122*  BUN 30*  --  24*  CREATININE 1.72*  --  1.48*  CALCIUM 10.2  --  9.8  MG  --  1.9 2.7*  PHOS  --  2.6  --    Liver Function Tests:  Recent Labs  11/29/15 2017  AST 35  ALT 19  ALKPHOS 73  BILITOT 0.6  PROT 6.6  ALBUMIN 3.8   CBC:  Recent Labs  11/29/15 2017  WBC 6.1  HGB 12.7*  HCT 38.0*  MCV 90.7  PLT 206   Cardiac Enzymes:  Recent Labs  11/29/15 2335 11/30/15 0450 11/30/15 1101  TROPONINI 0.12* 0.18* 0.13*   Hemoglobin A1C:  Recent Labs  11/30/15 0450  HGBA1C 6.3*    RADIOLOGY: Dg Chest Portable 1 View 11/29/2015  CLINICAL DATA:  AICD fired 5 times tonight. No chest pain or shortness of breath. EXAM: PORTABLE CHEST 1 VIEW COMPARISON:  11/11/2015 FINDINGS: The pacer wires/ AICD appears stable. Stable surgical changes from aortic valve replacement surgery. Stable borderline cardiac size. Stable tortuous calcified thoracic aorta. The lungs are clear. The bony thorax is intact. IMPRESSION: No acute cardiopulmonary  findings. Stable position of the pacer wires/ AICD. Stable tortuous calcified thoracic aorta. Electronically Signed   By: Marijo Sanes M.D.   On: 11/29/2015 21:44    ASSESSMENT AND PLAN:  Active Problems:   Ventricular tachycardia (HCC)  1.  VT storm No recurrence on IV amiodarone Continue IV amiodarone today Will transition to po tomorrow morning- '400mg'$  bid x1 week then '400mg'$  daily LFT's normal this admission Will update TSH and FT4 tomorrow morning No driving x6 months   2.  CAD No recent ischemic symptoms Continue medical therapy  Chanetta Marshall, NP 12/01/2015 8:28 AM  EP Attending  Patient seen and examined. Agree with the findings as noted above by Chanetta Marshall, NP-C. The patient has had no recurrent VT and is tolerating IV amio. Will continue this medication today and transition to oral amio on Friday. Would anticipate discharge home on Saturday.  Mikle Bosworth.D.

## 2015-12-01 NOTE — Plan of Care (Signed)
Problem: Education: Goal: Knowledge of [facility] Education information/materials will improve Outcome: Progressing Patient aware of plan of care.  Medication education provided to patient on all medications administered thus far this shift.  Patient stated understanding.  Patient has denied pain thus far this shift.  RN instructed patient if started to experience any pain to notify staff.  Patient agreeable.

## 2015-12-02 ENCOUNTER — Other Ambulatory Visit: Payer: Self-pay | Admitting: *Deleted

## 2015-12-02 LAB — TSH: TSH: 7.579 u[IU]/mL — AB (ref 0.350–4.500)

## 2015-12-02 LAB — GLUCOSE, CAPILLARY
Glucose-Capillary: 93 mg/dL (ref 65–99)
Glucose-Capillary: 88 mg/dL (ref 65–99)

## 2015-12-02 LAB — T4, FREE: Free T4: 0.77 ng/dL (ref 0.61–1.12)

## 2015-12-02 MED ORDER — AMIODARONE HCL 400 MG PO TABS: 400.0000 mg | ORAL_TABLET | Freq: Two times a day (BID) | ORAL | Status: DC

## 2015-12-02 MED ORDER — AMIODARONE HCL 200 MG PO TABS
400.0000 mg | ORAL_TABLET | Freq: Two times a day (BID) | ORAL | Status: DC
  Administered 2015-12-02: 400 mg via ORAL
  Filled 2015-12-02: qty 2

## 2015-12-02 MED ORDER — AMIODARONE HCL 200 MG PO TABS: 400.0000 mg | ORAL_TABLET | Freq: Two times a day (BID) | ORAL | Status: DC

## 2015-12-02 NOTE — Telephone Encounter (Signed)
Per call from patients wife, pharmacist at [facility] informed them that the rx for the 400 mg tablets would be over $200.00/month. The pharmacist stated that if the rx could be changed to the 200 mg tablets it would be much cheaper. Patient and wife aware that he will need to take two tablets twice daily to equal the prescribed dose.

## 2015-12-02 NOTE — Progress Notes (Addendum)
SUBJECTIVE: The patient is doing well today.  At this time, he denies chest pain, shortness of breath, or any new concerns.  CURRENT MEDICATIONS: . amiodarone  400 mg Oral BID  . amLODipine  10 mg Oral Daily  . aspirin  81 mg Oral Daily  . atorvastatin  80 mg Oral Daily  . cilostazol  100 mg Oral BID  . clopidogrel  75 mg Oral Daily  . ezetimibe  10 mg Oral Daily  . heparin  5,000 Units Subcutaneous Q8H  . insulin aspart  0-9 Units Subcutaneous TID WC  . metoprolol tartrate  25 mg Oral BID  . multivitamin with minerals  1 tablet Oral Daily  . omega-3 acid ethyl esters  1 g Oral BID  . sodium chloride flush  3 mL Intravenous Q12H  . sodium chloride flush  3 mL Intravenous Q12H      OBJECTIVE: Physical Exam: Filed Vitals:   12/01/15 1900 12/02/15 0024 12/02/15 0443 12/02/15 0800  BP: 118/61 113/54 123/67 160/98  Pulse: 60 60 59 81  Temp: 98.3 F (36.8 C) 97.8 F (36.6 C) 98 F (36.7 C) 98.3 F (36.8 C)  TempSrc: Oral Oral Oral Oral  Resp: '17 18 16 23  '$ Height:      Weight:   142 lb 1.6 oz (64.456 kg)   SpO2: 93% 91% 95% 95%    Intake/Output Summary (Last 24 hours) at 12/02/15 0820 Last data filed at 12/02/15 0759  Gross per 24 hour  Intake 3054.83 ml  Output   3325 ml  Net -270.17 ml    Telemetry reveals atrial pacing with intrinsic ventricular conduction   GEN- The patient is well appearing, alert and oriented x 3 today.   Head- normocephalic, atraumatic Eyes-  Sclera clear, conjunctiva pink Ears- hearing intact Oropharynx- clear Neck- supple  Lungs- Clear to ausculation bilaterally, normal work of breathing Heart- Regular rate and rhythm  GI- soft, NT, ND, + BS Extremities- no clubbing, cyanosis, or edema Skin- no rash or lesion Psych- euthymic mood, full affect Neuro- strength and sensation are intact  LABS: Basic Metabolic Panel:  Recent Labs  11/29/15 2017 11/29/15 2105 11/30/15 0450  NA 135  --  140  K 4.4  --  4.7  CL 104  --  110    CO2 23  --  24  GLUCOSE 280*  --  122*  BUN 30*  --  24*  CREATININE 1.72*  --  1.48*  CALCIUM 10.2  --  9.8  MG  --  1.9 2.7*  PHOS  --  2.6  --    Liver Function Tests:  Recent Labs  11/29/15 2017  AST 35  ALT 19  ALKPHOS 73  BILITOT 0.6  PROT 6.6  ALBUMIN 3.8   CBC:  Recent Labs  11/29/15 2017  WBC 6.1  HGB 12.7*  HCT 38.0*  MCV 90.7  PLT 206   Cardiac Enzymes:  Recent Labs  11/29/15 2335 11/30/15 0450 11/30/15 1101  TROPONINI 0.12* 0.18* 0.13*   Hemoglobin A1C:  Recent Labs  11/30/15 0450  HGBA1C 6.3*    RADIOLOGY: Dg Chest Portable 1 View 11/29/2015  CLINICAL DATA:  AICD fired 5 times tonight. No chest pain or shortness of breath. EXAM: PORTABLE CHEST 1 VIEW COMPARISON:  11/11/2015 FINDINGS: The pacer wires/ AICD appears stable. Stable surgical changes from aortic valve replacement surgery. Stable borderline cardiac size. Stable tortuous calcified thoracic aorta. The lungs are clear. The bony thorax is intact. IMPRESSION:  No acute cardiopulmonary findings. Stable position of the pacer wires/ AICD. Stable tortuous calcified thoracic aorta. Electronically Signed   By: Marijo Sanes M.D.   On: 11/29/2015 21:44    ASSESSMENT AND PLAN:  Active Problems:   Ventricular tachycardia (HCC)  1.  VT storm No recurrence on IV amiodarone Continue amiodarone '400mg'$  bid x1 week then '400mg'$  daily LFT's normal this admission. TSH elevated, FT4 normal No driving x6 months - pt aware   2.  CAD No recent ischemic symptoms Continue medical therapy  Anticipate discharge tomorrow  Chanetta Marshall, NP 12/02/2015 8:20 AM   EP Attending  Patient seen and examined. Agree with above. He is stable for DC home today.   Mikle Bosworth.D.

## 2015-12-02 NOTE — Discharge Summary (Signed)
ELECTROPHYSIOLOGY PROCEDURE DISCHARGE SUMMARY    Patient ID: Ralph Drake,  MRN: [id number], DOB/AGE: 1934-12-22 81 y.o.  Admit date: 11/29/2015 Discharge date: 12/02/2015  Primary Care Physician: Ralph Pink, MD Primary Cardiologist: [name] Electrophysiologist: [name]  Primary Discharge Diagnosis:  Active Problems:   Ventricular tachycardia ([place])   No Known Allergies  Procedures This Admission: none  Brief HPI/Hospital Course:  Ralph Drake is a 81 y.o. male with a past medical history significant for CAD, severe AS s/p AVR, PVD, mixed cardiomyopathy, and chronic systolic heart failure s/p ICD.  He was at home on the day of admission when he had several ICD shocks and presented to the ER for further evaluation.  Device interrogation demonstrated appropriate therapy for VT. He was placed on IV amiodarone and transitioned to PO with no further arrhythmias.  He was tolerating po amiodarone and ambulating without chest pain, shortness of breath, or palpitations on the day of discharge. He was seen by Dr [name] and considered stable for discharge to home.  The patient has been advised that he should not drive for 6 months per [place]. Will need TSH follow up at return office visit.   Physical Exam: Filed Vitals:   12/01/15 1900 12/02/15 0024 12/02/15 0443 12/02/15 0800  BP: 118/61 113/54 123/67 160/98  Pulse: 60 60 59 81  Temp: 98.3 F (36.8 C) 97.8 F (36.6 C) 98 F (36.7 C) 98.3 F (36.8 C)  TempSrc: Oral Oral Oral Oral  Resp: '17 18 16 23  '$ Height:      Weight:   142 lb 1.6 oz (64.456 kg)   SpO2: 93% 91% 95% 95%    Labs:   Lab Results  Component Value Date   WBC 6.1 11/29/2015   HGB 12.7* 11/29/2015   HCT 38.0* 11/29/2015   MCV 90.7 11/29/2015   PLT 206 11/29/2015    Recent Labs Lab 11/29/15 2017 11/30/15 0450  NA 135 140  K 4.4 4.7  CL 104 110  CO2 23 24  BUN 30* 24*  CREATININE 1.72* 1.48*  CALCIUM 10.2 9.8  PROT 6.6  --   BILITOT 0.6  --     ALKPHOS 73  --   ALT 19  --   AST 35  --   GLUCOSE 280* 122*     Discharge Medications: Current Discharge Medication List    START taking these medications   Details  amiodarone (PACERONE) 400 MG tablet Take 1 tablet (400 mg total) by mouth 2 (two) times daily. Qty: 60 tablet, Refills: 2      CONTINUE these medications which have NOT CHANGED   Details  amLODipine (NORVASC) 10 MG tablet Take 1 tablet (10 mg total) by mouth daily. Qty: 90 tablet, Refills: 3    aspirin 81 MG EC tablet Take 1 tablet (81 mg total) by mouth daily.    atorvastatin (LIPITOR) 80 MG tablet Take 1 tablet (80 mg total) by mouth daily. Qty: 90 tablet, Refills: 3   Associated Diagnoses: Hyperlipidemia    cilostazol (PLETAL) 100 MG tablet Take 1 tablet (100 mg total) by mouth 2 (two) times daily. Qty: 180 tablet, Refills: 3    clopidogrel (PLAVIX) 75 MG tablet Take 1 tablet (75 mg total) by mouth daily. Qty: 30 tablet, Refills: 3    ezetimibe (ZETIA) 10 MG tablet Take 1 tablet (10 mg total) by mouth daily. Qty: 90 tablet, Refills: 3    fish oil-omega-3 fatty acids 1000 MG capsule Take 1 g by  mouth 2 (two) times daily.     hydrochlorothiazide (MICROZIDE) 12.5 MG capsule Take 1 capsule (12.5 mg total) by mouth daily. Qty: 90 capsule, Refills: 3    lisinopril (PRINIVIL) 10 MG tablet Take 1 tablet (10 mg total) by mouth daily. Qty: 30 tablet, Refills: 3    metoprolol tartrate (LOPRESSOR) 25 MG tablet Take 0.5 tablets (12.5 mg total) by mouth 2 (two) times daily. Qty: 90 tablet, Refills: 3    Multiple Vitamins-Minerals (MULTIVITAMIN WITH MINERALS) tablet Take 1 tablet by mouth daily.    vitamin E 400 UNIT capsule Take 2 capsules by mouth daily.        Disposition: Pt is being discharged home today in good condition. Discharge Instructions    Diet - low sodium heart healthy    Complete by:  As directed      Discharge instructions    Complete by:  As directed   No driving for 6 months      Increase activity slowly    Complete by:  As directed           Follow-up Information    Follow up with Baldwin Jamaica, PA-C On 12/14/2015.   Specialty:  Cardiology   Why:  at [facility] information:   [place] [address] [phone]       Duration of Discharge Encounter: Greater than 30 minutes including physician time.  Signed, Chanetta Marshall, NP 12/02/2015 12:24 PM   Mikle Bosworth.D.

## 2015-12-02 NOTE — Care Management Important Message (Signed)
Important Message  Patient Details  Name: Ralph Drake MRN: [id number] Date of Birth: 1935/04/12   Medicare Important Message Given:  Yes    Nathen May 12/02/2015, 11:25 AM

## 2015-12-02 NOTE — Discharge Instructions (Signed)
Amiodarone tablets What is this medicine? AMIODARONE (a MEE oh da rone) is an antiarrhythmic drug. It helps make your heart beat regularly. Because of the side effects caused by this medicine, it is only used when other medicines have not worked. It is usually used for heartbeat problems that may be life threatening. This medicine may be used for other purposes; ask your health care provider or pharmacist if you have questions. What should I tell my health care provider before I take this medicine? They need to know if you have any of these conditions: -liver disease -lung disease -other heart problems -thyroid disease -an unusual or allergic reaction to amiodarone, iodine, other medicines, foods, dyes, or preservatives -pregnant or trying to get pregnant -breast-feeding How should I use this medicine? Take this medicine by mouth with a glass of water. Follow the directions on the prescription label. You can take this medicine with or without food. However, you should always take it the same way each time. Take your doses at regular intervals. Do not take your medicine more often than directed. Do not stop taking except on the advice of your doctor or health care professional. A special MedGuide will be given to you by the pharmacist with each prescription and refill. Be sure to read this information carefully each time. Talk to your pediatrician regarding the use of this medicine in children. Special care may be needed. Overdosage: If you think you have taken too much of this medicine contact a poison control center or emergency room at once. NOTE: This medicine is only for you. Do not share this medicine with others. What if I miss a dose? If you miss a dose, take it as soon as you can. If it is almost time for your next dose, take only that dose. Do not take double or extra doses. What may interact with this medicine? Do not take this medicine with any of the following  medications: -abarelix -apomorphine -arsenic trioxide -certain antibiotics like erythromycin, gemifloxacin, levofloxacin, pentamidine -certain medicines for depression like amoxapine, tricyclic antidepressants -certain medicines for fungal infections like fluconazole, itraconazole, ketoconazole, posaconazole, voriconazole -certain medicines for irregular heart beat like disopyramide, dofetilide, dronedarone, ibutilide, propafenone, sotalol -certain medicines for malaria like chloroquine, halofantrine -cisapride -droperidol -haloperidol -haw[place]orn -maprotiline -methadone -phenothiazines like chlorpromazine, mesoridazine, thioridazine -pimozide -ranolazine -red yeast rice -vardenafil -ziprasidone This medicine may also interact with the following medications: -antiviral medicines for HIV or AIDS -certain medicines for blood pressure, heart disease, irregular heart beat -certain medicines for cholesterol like atorvastatin, cerivastatin, lovastatin, simvastatin -certain medicines for hepatitis C like sofosbuvir and ledipasvir; sofosbuvir -certain medicines for seizures like phenytoin -certain medicines for thyroid problems -certain medicines that treat or prevent blood clots like warfarin -cholestyramine -cimetidine -clopidogrel -cyclosporine -dextromethorphan -diuretics -fentanyl -general anesthetics -grapefruit juice -lidocaine -loratadine -methotrexate -other medicines that prolong the QT interval (cause an abnormal heart rhythm) -procainamide -quinidine -rifabutin, rifampin, or rifapentine -St. John's Wort -trazodone This list may not describe all possible interactions. Give your health care provider a list of all the medicines, herbs, non-prescription drugs, or dietary supplements you use. Also tell them if you smoke, drink alcohol, or use illegal drugs. Some items may interact with your medicine. What should I watch for while using this medicine? Your condition will  be monitored closely when you first begin therapy. Often, this drug is first started in a hospital or other monitored health care setting. Once you are on maintenance therapy, visit your doctor or health care professional for regular  checks on your progress. Because your condition and use of this medicine carry some risk, it is a good idea to carry an identification card, necklace or bracelet with details of your condition, medications, and doctor or health care professional. Dennis Bast may get drowsy or dizzy. Do not drive, use machinery, or do anything that needs mental alertness until you know how this medicine affects you. Do not stand or sit up quickly, especially if you are an older patient. This reduces the risk of dizzy or fainting spells. This medicine can make you more sensitive to the sun. Keep out of the sun. If you cannot avoid being in the sun, wear protective clothing and use sunscreen. Do not use sun lamps or tanning beds/booths. You should have regular eye exams before and during treatment. Call your doctor if you have blurred vision, see halos, or your eyes become sensitive to light. Your eyes may get dry. It may be helpful to use a lubricating eye solution or artificial tears solution. If you are going to have surgery or a procedure that requires contrast dyes, tell your doctor or health care professional that you are taking this medicine. What side effects may I notice from receiving this medicine? Side effects that you should report to your doctor or health care professional as soon as possible: -allergic reactions like skin rash, itching or hives, swelling of the face, lips, or tongue -blue-gray coloring of the skin -blurred vision, seeing blue green halos, increased sensitivity of the eyes to light -breathing problems -chest pain -dark urine -fast, irregular heartbeat -feeling faint or light-headed -intolerance to heat or cold -nausea or vomiting -pain and swelling of the  scrotum -pain, tingling, numbness in feet, hands -redness, blistering, peeling or loosening of the skin, including inside the mouth -spitting up blood -stomach pain -sweating -unusual or uncontrolled movements of body -unusually weak or tired -weight gain or loss -yellowing of the eyes or skin Side effects that usually do not require medical attention (report to your doctor or health care professional if they continue or are bothersome): -change in sex drive or performance -constipation -dizziness -headache -loss of appetite -trouble sleeping This list may not describe all possible side effects. Call your doctor for medical advice about side effects. You may report side effects to FDA at 1-800-FDA-1088. Where should I keep my medicine? Keep out of the reach of children. Store at room temperature between 20 and 25 degrees C (68 and 77 degrees F). Protect from light. Keep container tightly closed. Throw away any unused medicine after the expiration date. NOTE: This sheet is a summary. It may not cover all possible information. If you have questions about this medicine, talk to your doctor, pharmacist, or health care provider.    2016, [employer]. (2013-08-31 19:48:11)

## 2015-12-02 NOTE — Progress Notes (Signed)
Pt discharged home. Discharge instructions have been gone over with the patient. IV's removed. Pt given unit number and told to call if they have any concerns regarding their discharge instructions. Quintavius Niebuhr V, RN   

## 2015-12-06 ENCOUNTER — Encounter: Payer: Medicare Other | Admitting: Internal Medicine

## 2015-12-13 NOTE — Progress Notes (Signed)
Cardiology Office Note Date:  12/14/2015  Patient ID:  Ralph Drake [birth year], MRN [id number] PCP:  [name], MD  Cardiologist:  Dr. [name] Electrophysiologist: Dr. [name]   Chief Complaint: f/u hospital visit  History of Present Illness: Ralph Drake is a 81 y.o. male with history of CAD (coronary angioplasty x 2 1993), severe AS s/p 23 [place] Ease Pericardial Tissue Valve 2014, PAD (SFA stent 1996, SFA occlusion 2015), HTN, HLD, bradycardia, known solitary kidney with baseline stage III CKD, and h/o post op PAF without reccurence and not on AC.  He was admitted from 5/30-6/2 after admitted with sustained VT. This strip showed a regular wide complex acardia resolved after 2 amiodarone bolus. Patient was seen by Dr. [name] on 11/08/2015, given hemodynamically unstable VT that terminated with IV amiodarone, was felt the patient needed ischemic event to rule out underlying cause. He underwent cardiac catheterization on 11/09/2015 which showed 50% proximal left circumflex lesion, 40% mid LAD lesion, 70% distal RCA lesion, 40% mid RCA lesion. There was no obvious culprit for VT based on coronary anatomy unless there was thrombosis of the RCA with reperfusion however this is unlikely given the very trivial increase in the troponin. Due to lack of obvious cause, patient underwent St. Jude dual-chamber ICD implantation by Dr. [name] on 11/10/2015. He was eventually discharged on 11/11/2015, prior to the discharge, his potassium was elevated, his lisinopril was decreased to half. He was recommended to start Plavix for his CAD, this was started today after discharge. He was seen by cardiology service [facility] 6/14./17 at that time doing well.  Unfortunately re-admitted 11/29/15 with a VT storm with multiple ICD shocks, started on IV amiodarone and transitioned to PO and discharged 12/02/15 on amiodarone '400mg'$  BID.  He comes in today being seen for Dr. [name] feeling well.  He has not had  any kind of CP, palpitations or SOB, no dizziness, near syncope or syncope, no shocks from his device.  He has noticed the taste of water has changed for him.  DEVICE history 11/29/15 VT storm with multiple shocks   Past Medical History  Diagnosis Date  . CAD S/P percutaneous coronary angioplasty 1993    POBA to Cx x 2 occasions; mild to moderate/nonobstructive CAD on preop cath August 2014  . Severe aortic stenosis by prior echocardiogram     per Dr. Rollene Fare 2012; referred for aortic valve replacement  . S/P AVR (aortic valve replacement) 02/10/2013    [address] [place] Ease Pericardial Tissue Valve  . Peripheral vascular disease ([place])      H/O R SFA stent, L Iliac Stent 1996;;  Lower Extremity Angiography April 2015: Occluded left SFA reconstituting at end of the canal, heavily calcified. Three-vessel runoff..;; RCA 50% ostial right common iliac, patent SFA stent -> Referred to Dr. Trula Slade of [facility].  . Shortness of breath     Chronic  . Hypertension   . Dyslipidemia, goal LDL below 70   . Olecranon bursitis of left elbow   . Trigger ring finger of left hand   . Osteoarthritis   . Bradycardia   . GERD (gastroesophageal reflux disease)   . Former heavy tobacco smoker      quit in October 2014  . Atrial fibrillation ([facility])   . Tubular adenoma of colon   . VT (ventricular tachycardia) ([place])     hemodynamically significant VT on 11/07/2015, nonobstructive CAD, no culprit lesion, underwent St Jude Dual Chamber ICD by Dr.  [name]    Past Surgical History  Procedure Laterality Date  . Tonsillectomy      as child  . Right hand surgery    . Trigger finger release  07/23/2011    Procedure: RELEASE TRIGGER FINGER/A-1 PULLEY;  Surgeon: Lorn Junes, MD;  Location: [place];  Service: Orthopedics;  Laterality: Left;  release trigger left ring finger patient had supraclavicular block in preop  . Olecranon bursectomy  07/23/2011    Procedure: OLECRANON BURSA;  Surgeon:  Lorn Junes, MD;  Location: [place];  Service: Orthopedics;  Laterality: Left;  excision olecranon bursa left elbow patient had supraclaviular block in preop  . Coronary angioplasty  1993    Circumflex x 2   . Rotator cuff repair Right   . Aortic valve replacement  02/10/2013    Dr. Servando Snare: [address] Ease Pericardia  . Iliac artery stent Left 1996  . Superficial femoral atery stent Right 1996  . Aortic valve replacement N/A 02/10/2013    AORTIC VALVE REPLACEMENT (AVR);  Surgeon: Grace Isaac, MD;  Location: [place];  Service: Open Heart Surgery;  Laterality: N/A  . Intraoperative transesophageal echocardiogram N/A 02/10/2013    Procedure: INTRAOPERATIVE TRANSESOPHAGEAL ECHOCARDIOGRAM;  Surgeon: Grace Isaac, MD;  Location: [place];  Service: Open Heart Surgery;  Laterality: N/A;  . Transthoracic echocardiogram  03/23/2013    Post-Op Echo #1: EF 50-55%, Mild Conc LVH, Mild HK of Inferolateral wall with paradoxical septal motion (c/w post-op state); Well seated 23 mm Bioprosthetic Valve (area ~1.23 cm2 - normal for valve); Mild MR  . Cardiac catheterization  August 2014    Nonobstructive 30-50% RCA. No significant LAD disease. 30% mid circumflex.  . Peripheral vascular angiogram  07/19/1994    No intervention-recommend medical therapy  . Peripheral vascular angiogram  01/17/1995    Right SFA 80-90# lesion, dilatation performed with a Cordis "Optz 5" 68m x 2cm, inflated a 4-7.5atm for 40 to 70 seconds. Completion angiogram done by hand with "bolus chase" runoff from Primal SFA to foot. [place] in reduction of 80-90% to 20% with excellent flow and without dissection  . Arterial doppler - pre-cabg  02/06/2013    Bilateral 1-39% ICA stenosis  . Cardiac catheterization  11/10/2009    No intervention - recommend medical management  . Cardiovascular stress test  06/24/2012    Diffuse LV hypokinesis with moderately depressed systolic function and EF 321%  . Peripheral  vascular angiogram  09/10/2013    Occluded left SFA, heavily calcified with reconstitution at adductor canal. Three-vessel runof.;; Right lower extremity noted 50% ostial right common iliac stenosis with roughly 20 mm gradient. Not considered significant.f  . Left and right heart catheterization with coronary angiogram N/A 01/28/2013    Procedure: LEFT AND RIGHT HEART CATHETERIZATION WITH CORONARY ANGIOGRAM;  Surgeon: DLeonie Man MD;  [facility];  Service: Cardiovascular;  Laterality: N/A;  . Lower extremity angiogram N/A 09/10/2013    Procedure: LOWER EXTREMITY ANGIOGRAM;  Surgeon: JLorretta Harp MD;  [facility];  Service: Cardiovascular;  Laterality: N/A;  . Abdominal angiogram  09/10/2013    Procedure: ABDOMINAL ANGIOGRAM;  Surgeon: JLorretta Harp MD;  [facility];  Service: Cardiovascular;;  . Transthoracic echocardiogram  March 2017    EF 50-55%. No RWMA.  GR 1 DD.  23 mm bioprosthetic valve functioning normally.  Mild biatrial dilation.  . Cardiac catheterization N/A 11/09/2015    Procedure: Left Heart  Cath and Coronary Angiography;  Surgeon: Belva Crome, MD;  Location: [place] CV LAB;  Service: Cardiovascular;  Laterality: N/A;  . Ep implantable device N/A 11/10/2015    Procedure: ICD Implant;  Surgeon: Evans Lance, MD;  Location: [place] [facility] LAB;  Service: Cardiovascular;  Laterality: N/A;    Current Outpatient Prescriptions  Medication Sig Dispense Refill  . amiodarone (PACERONE) 200 MG tablet Take 2 tablets (400 mg total) by mouth 2 (two) times daily. 120 tablet 3  . amLODipine (NORVASC) 10 MG tablet Take 1 tablet (10 mg total) by mouth daily. 90 tablet 3  . aspirin 81 MG EC tablet Take 1 tablet (81 mg total) by mouth daily.    [name] atorvastatin (LIPITOR) 80 MG tablet Take 1 tablet (80 mg total) by mouth daily. 90 tablet 3  . cilostazol (PLETAL) 100 MG tablet Take 1 tablet (100 mg total) by mouth 2 (two) times daily. 180 tablet 3  .  clopidogrel (PLAVIX) 75 MG tablet Take 1 tablet (75 mg total) by mouth daily. 30 tablet 3  . ezetimibe (ZETIA) 10 MG tablet Take 1 tablet (10 mg total) by mouth daily. 90 tablet 3  . fish oil-omega-3 fatty acids 1000 MG capsule Take 1 g by mouth 2 (two) times daily.     . hydrochlorothiazide (MICROZIDE) 12.5 MG capsule Take 1 capsule (12.5 mg total) by mouth daily. 90 capsule 3  . lisinopril (PRINIVIL) 10 MG tablet Take 1 tablet (10 mg total) by mouth daily. 30 tablet 3  . metoprolol tartrate (LOPRESSOR) 25 MG tablet Take 0.5 tablets (12.5 mg total) by mouth 2 (two) times daily. 90 tablet 3  . Multiple Vitamins-Minerals (MULTIVITAMIN WITH MINERALS) tablet Take 1 tablet by mouth daily.    . vitamin E 400 UNIT capsule Take 2 capsules by mouth daily.     No current facility-administered medications for this visit.    Allergies:   Review of patient's allergies indicates no known allergies.   Social History:  The patient  reports that he quit smoking about 2 years ago. His smoking use included Cigarettes. He has a 30 pack-year smoking history. He has never used smokeless tobacco. He reports that he does not drink alcohol or use illicit drugs.   Family History:  The patient's family history includes Cancer in his mother; Hypertension in his mother; Stroke in his father.  ROS:  Please see the history of present illness.  All other systems are reviewed and otherwise negative.   PHYSICAL EXAM:  VS:  BP 122/64 mmHg  Pulse 60  Ht '5\' 5"'$  (1.651 m)  Wt 144 lb (65.318 kg)  BMI 23.96 kg/m2 BMI: Body mass index is 23.96 kg/(m^2). Well nourished, well developed, in no acute distress HEENT: normocephalic, atraumatic Neck: no JVD, carotid bruits or masses Cardiac:  normal S1, S2; RRR; 1/6 SM, no rubs, or gallops Lungs:  clear to auscultation bilaterally, no wheezing, rhonchi or rales Abd: soft, nontender MS: no deformity or atrophy Ext: no edema Skin: warm and dry, no rash Neuro:  No gross deficits  appreciated Psych: euthymic mood, full affect  ICD site is stable, no tethering or discomfort, well healed   EKG: 11/29/15 #1 SR, RBBB #2 AV paced  08/15/15: Echocardiogram Study Conclusions - Left ventricle: The cavity size was normal. There was mild  concentric hypertrophy. Systolic function was normal. The  estimated ejection fraction was in the range of 50% to 55%. Wall  motion was normal; there were no regional wall  motion  abnormalities. Doppler parameters are consistent with abnormal  left ventricular relaxation (grade 1 diastolic dysfunction). - Aortic valve: A 23 mm bioprosthesis was present and functioning  normally. Valve area (VTI): 1.5 cm^2. Valve area (Vmax): 1.3  cm^2. Valve area (Vmean): 1.34 cm^2. - Mitral valve: Transvalvular velocity was within the normal range.  There was no evidence for stenosis. There was trivial  regurgitation. - Left atrium: The atrium was mildly dilated. - Right ventricle: The cavity size was normal. Wall thickness was  normal. - Right atrium: The atrium was mildly dilated. - Atrial septum: No defect or patent foramen ovale was identified  by color flow Doppler. - Tricuspid valve: There was trivial regurgitation. - Inferior vena cava: The vessel was normal in size. The  respirophasic diameter changes were in the normal range (= 50%),  consistent with normal central venous pressure.  11/09/15: [facility], Dr. Tamala Julian Conclusion    1. Prox Cx to Mid Cx lesion, 50% stenosed. 2. Mid LAD lesion, 40% stenosed. 3. Dist RCA lesion, 70% stenosed. 4. Mid RCA lesion, 40% stenosed.   Known solitary kidney with CKD stage III.  Moderate to moderately severe mid to distal RCA with eccentric 50-70% stenosis. Irregularities up to 40-50% noted in the mid vessel.  Moderate disease noted in the mid LAD and mid circumflex.  Left ventriculography and LV hemodynamics were not recorded due to prosthetic tissue valve which we did not  cross.  RECOMMENDATIONS:   No obvious explanation for VT based upon coronary anatomy unless there was thrombosis of the right coronary with reperfusion. This seems unlikely given the very trivial increase in troponin.  The electrophysiology service will make further recommendations.  Aggressive risk factor modification. I would also consider adding Plavix to 81 mg aspirin in the setting of ACS. Hold Plavix until EP has made file decision concerning invasive procedures/device therapy.        ICD interrogation today: normal device function, no VT, no AMS, no episodes or therapies. No changes made.   Recent Labs: 11/29/2015: ALT 19; Hemoglobin 12.7*; Platelets 206 11/30/2015: BUN 24*; Creatinine, Ser 1.48*; Magnesium 2.7*; Potassium 4.7; Sodium 140 12/02/2015: TSH 7.579*  02/07/2015: Cholesterol 178; HDL 51; LDL Cholesterol 99; Total CHOL/HDL Ratio 3.5; Triglycerides 141; VLDL 28   Estimated Creatinine Clearance: 35.8 mL/min (by C-G formula based on Cr of 1.48).   Wt Readings from Last 3 Encounters:  12/14/15 144 lb (65.318 kg)  12/02/15 142 lb 1.6 oz (64.456 kg)  11/23/15 142 lb 1.9 oz (64.465 kg)     Other studies reviewed: Additional studies/records reviewed today include: summarized above  DEVICE information: STJ, dual chamber ICD, implanted 11/10/15, Dr. [name], [place] 11/29/15 VT storm with multiple shocks  ASSESSMENT AND PLAN:  1. VT recurrent with multiple ICD therapies  11/07/15 admitted with VT >> ICD implant >>> VT storm, multiple ICD therapies >>> started on Amiodarone  TSH was 7.579 at his hospital stay, needs recheck     No further arrhythmias on device check     NO DRIVING 6 MONTHS, PT IS AWARE     He will see Dr. [name] August, then [facility] remote 46month  2. CAD (PCI 1993)  Cath 11/09/15, stable CAD, no intervention  no c/o CP  on ASA/Plavix, BB, statin, zetia  3. VHD s/p AVR (bioprosthetic) 2014     He is aware of need for dental/pre-procedure  antibiotic prophylaxsis  4. PVD, left SFA stent, 1996, occl noted 2015  On Pletal   5. CRI, stage  III  6. Post AVR (post0op) PAFib     None known for him since, not on a/c     No AMS episodes on his device     Will continue to monitor via the ICD  Disposition: Complete 2 weeks of amiodarone '400mg'$  BID, then decrease to '400mg'$  daily (12/17/15) until he sees Dr. [name] next month.   Current medicines are reviewed at length with the patient today.  The patient did not have any concerns regarding medicines.  Ralph Lasso, PA-C 12/14/2015 10:49 AM     [facility] [place]  [id number] [phone] (office)  [phone] (fax)

## 2015-12-14 ENCOUNTER — Encounter: Payer: Self-pay | Admitting: Physician Assistant

## 2015-12-14 ENCOUNTER — Ambulatory Visit (INDEPENDENT_AMBULATORY_CARE_PROVIDER_SITE_OTHER): Payer: Medicare Other | Admitting: Physician Assistant

## 2015-12-14 VITALS — BP 122/64 | HR 60 | Ht 65.0 in | Wt 144.0 lb

## 2015-12-14 DIAGNOSIS — I38 Endocarditis, valve unspecified: Secondary | ICD-10-CM | POA: Diagnosis not present

## 2015-12-14 DIAGNOSIS — I472 Ventricular tachycardia, unspecified: Secondary | ICD-10-CM

## 2015-12-14 DIAGNOSIS — I2510 Atherosclerotic heart disease of native coronary artery without angina pectoris: Secondary | ICD-10-CM | POA: Diagnosis not present

## 2015-12-14 DIAGNOSIS — Z9861 Coronary angioplasty status: Secondary | ICD-10-CM

## 2015-12-14 DIAGNOSIS — I429 Cardiomyopathy, unspecified: Secondary | ICD-10-CM | POA: Diagnosis not present

## 2015-12-14 DIAGNOSIS — I428 Other cardiomyopathies: Secondary | ICD-10-CM

## 2015-12-14 LAB — HEPATIC FUNCTION PANEL
ALBUMIN: 4.6 g/dL (ref 3.6–5.1)
ALK PHOS: 61 U/L (ref 40–115)
ALT: 19 U/L (ref 9–46)
AST: 19 U/L (ref 10–35)
BILIRUBIN TOTAL: 0.5 mg/dL (ref 0.2–1.2)
Bilirubin, Direct: 0.1 mg/dL (ref <=0.2)
Indirect Bilirubin: 0.4 mg/dL (ref 0.2–1.2)
TOTAL PROTEIN: 6.8 g/dL (ref 6.1–8.1)

## 2015-12-14 LAB — TSH: TSH: 7.38 m[IU]/L — AB (ref 0.40–4.50)

## 2015-12-14 MED ORDER — AMIODARONE HCL 200 MG PO TABS: 200.0000 mg | ORAL_TABLET | Freq: Two times a day (BID) | ORAL | Status: DC

## 2015-12-14 NOTE — Patient Instructions (Signed)
Medication Instructions:   ON SAT 12/17/2015 START TAKING 400 MG AMIODARONE DAILY  ( 2 TABLETS OF 200  DAILY)   If you need a refill on your cardiac medications before your next appointment, please call your pharmacy.  Labwork: TSH AND LFT TODAY    Testing/Procedures:  NONE ORDER TODAY    Follow-Up: KEEP APPT WITH DT TAYLOR IN [place]    Any Other Special Instructions Will Be Listed Below (If Applicable).

## 2015-12-15 ENCOUNTER — Telehealth: Payer: Self-pay | Admitting: *Deleted

## 2015-12-15 NOTE — Telephone Encounter (Signed)
-----   Message from [place], [place] sent at 12/14/2015  6:00 PM EDT ----- Please let the patient know his liver enzymes are normal, his thyroid test is mildly elevated, similar to previous, will recheck after he has been on the lower dose of amiodarone, to continue as planned and discussed today.  To keep his f/uwith dr. [name] as scheduled.  Thanks [employer]

## 2015-12-15 NOTE — Telephone Encounter (Signed)
-----   Message from [place], [place] sent at 12/14/2015  6:00 PM EDT ----- Please let the patient know his liver enzymes are normal, his thyroid test is mildly elevated, similar to previous, will recheck after he has been on the lower dose of amiodarone, to continue as planned and discussed today.  To keep his f/uwith dr. [name] as scheduled.  Thanks [employer]

## 2016-01-13 ENCOUNTER — Telehealth: Payer: Self-pay | Admitting: Internal Medicine

## 2016-01-13 NOTE — Telephone Encounter (Signed)
Pts wife called and wanted to let Dr. Hilarie Drake know that pt has now been started on Plavix and amiodorone. She wanted to know if Dr. Hilarie Drake thinks her husband is still ok to have colon done. Please advise.

## 2016-01-15 ENCOUNTER — Encounter: Payer: Self-pay | Admitting: Internal Medicine

## 2016-01-15 NOTE — Telephone Encounter (Signed)
Chart reviewed I would recommend no colonoscopy at this point Office followup in 6 months with me.  We can discuss at that point (options including discontinuation of colon screening/surveillance)

## 2016-01-16 NOTE — Telephone Encounter (Signed)
Left message to call back  

## 2016-01-16 NOTE — Telephone Encounter (Signed)
Recall in epic for 6 mth OV.

## 2016-01-17 ENCOUNTER — Encounter: Payer: Self-pay | Admitting: Internal Medicine

## 2016-01-17 NOTE — Telephone Encounter (Signed)
Left message to call back  

## 2016-01-18 NOTE — Telephone Encounter (Signed)
Wife aware, colon cancelled and recall in for OV.

## 2016-02-01 ENCOUNTER — Ambulatory Visit (INDEPENDENT_AMBULATORY_CARE_PROVIDER_SITE_OTHER): Payer: Medicare Other | Admitting: Internal Medicine

## 2016-02-01 ENCOUNTER — Encounter: Payer: Self-pay | Admitting: Internal Medicine

## 2016-02-01 VITALS — BP 130/80 | HR 60 | Ht 65.0 in | Wt 150.4 lb

## 2016-02-01 DIAGNOSIS — Z9861 Coronary angioplasty status: Secondary | ICD-10-CM

## 2016-02-01 DIAGNOSIS — I472 Ventricular tachycardia, unspecified: Secondary | ICD-10-CM

## 2016-02-01 DIAGNOSIS — I2510 Atherosclerotic heart disease of native coronary artery without angina pectoris: Secondary | ICD-10-CM | POA: Diagnosis not present

## 2016-02-01 LAB — CUP PACEART INCLINIC DEVICE CHECK
Battery Remaining Longevity: 85.2
Brady Statistic RA Percent Paced: 98 %
Brady Statistic RV Percent Paced: 43 %
HighPow Impedance: 64.125
Implantable Lead Implant Date: 20170601
Implantable Lead Location: 753859
Implantable Lead Location: 753860
Lead Channel Pacing Threshold Amplitude: 0.75 V
Lead Channel Pacing Threshold Amplitude: 0.75 V
Lead Channel Pacing Threshold Amplitude: 0.75 V
Lead Channel Pacing Threshold Pulse Width: 0.5 ms
Lead Channel Pacing Threshold Pulse Width: 0.5 ms
Lead Channel Pacing Threshold Pulse Width: 0.5 ms
Lead Channel Sensing Intrinsic Amplitude: 2.4 mV
Lead Channel Setting Pacing Amplitude: 2.5 V
Lead Channel Setting Pacing Pulse Width: 0.5 ms
MDC IDC LEAD IMPLANT DT: 20170601
MDC IDC LEAD MODEL: 7122
MDC IDC MSMT LEADCHNL RA IMPEDANCE VALUE: 575 Ohm
MDC IDC MSMT LEADCHNL RA PACING THRESHOLD AMPLITUDE: 0.75 V
MDC IDC MSMT LEADCHNL RV IMPEDANCE VALUE: 487.5 Ohm
MDC IDC MSMT LEADCHNL RV PACING THRESHOLD PULSEWIDTH: 0.5 ms
MDC IDC MSMT LEADCHNL RV SENSING INTR AMPL: 3.8 mV
MDC IDC PG SERIAL: 7362271
MDC IDC SESS DTM: 20170823115520
MDC IDC SET LEADCHNL RA PACING AMPLITUDE: 2 V
MDC IDC SET LEADCHNL RV SENSING SENSITIVITY: 0.5 mV

## 2016-02-01 MED ORDER — AMIODARONE HCL 200 MG PO TABS: 200.0000 mg | ORAL_TABLET | Freq: Two times a day (BID) | ORAL | 3 refills | Status: DC

## 2016-02-01 NOTE — Addendum Note (Signed)
Addended by: Janan Halter F on: 02/01/2016 11:58 AM   Modules accepted: Orders

## 2016-02-01 NOTE — Patient Instructions (Signed)
Medication Instructions:  Your physician recommends that you continue on your current medications as directed. Please refer to the Current Medication list given to you today.   Labwork: None ordered   Testing/Procedures: None ordered   Follow-Up: Remote monitoring is used to monitor your ICD from home. This monitoring reduces the number of office visits required to check your device to one time per year. It allows [place] to keep an eye on the functioning of your device to ensure it is working properly. You are scheduled for a device check from home on 05/02/16. You may send your transmission at any time that day. If you have a wireless device, the transmission will be sent automatically. After your physician reviews your transmission, you will receive a postcard with your next transmission date.   Your physician wants you to follow-up in: 9 months with Dr Knox Saliva will receive a reminder letter in the mail two months in advance. If you don't receive a letter, please call our office to schedule the follow-up appointment.    Thank you for choosing [facility]!!     Janan Halter, RN [phone]

## 2016-02-01 NOTE — Progress Notes (Signed)
HPI [name] returns today for followup. He is a pleasant 81 yo man with VT, ICM,chronic systolic heart failure, s/p ICD implant. In the interim he has been stable. He denies chest pain or sob. No syncope. He is unhappy about not driving. No Known Allergies   Current Outpatient Prescriptions  Medication Sig Dispense Refill  . amiodarone (PACERONE) 200 MG tablet Take 1 tablet (200 mg total) by mouth 2 (two) times daily. 60 tablet 6  . amLODipine (NORVASC) 10 MG tablet Take 1 tablet (10 mg total) by mouth daily. 90 tablet 3  . aspirin 81 MG EC tablet Take 1 tablet (81 mg total) by mouth daily.    [name] atorvastatin (LIPITOR) 80 MG tablet Take 1 tablet (80 mg total) by mouth daily. 90 tablet 3  . cilostazol (PLETAL) 100 MG tablet Take 1 tablet (100 mg total) by mouth 2 (two) times daily. 180 tablet 3  . clopidogrel (PLAVIX) 75 MG tablet Take 1 tablet (75 mg total) by mouth daily. 30 tablet 3  . ezetimibe (ZETIA) 10 MG tablet Take 1 tablet (10 mg total) by mouth daily. 90 tablet 3  . fish oil-omega-3 fatty acids 1000 MG capsule Take 1 g by mouth 2 (two) times daily.     . hydrochlorothiazide (MICROZIDE) 12.5 MG capsule Take 1 capsule (12.5 mg total) by mouth daily. 90 capsule 3  . lisinopril (PRINIVIL) 10 MG tablet Take 1 tablet (10 mg total) by mouth daily. 30 tablet 3  . metoprolol tartrate (LOPRESSOR) 25 MG tablet Take 0.5 tablets (12.5 mg total) by mouth 2 (two) times daily. 90 tablet 3  . Multiple Vitamins-Minerals (MULTIVITAMIN WITH MINERALS) tablet Take 1 tablet by mouth daily.    . vitamin E 400 UNIT capsule Take 2 capsules by mouth daily.     No current facility-administered medications for this visit.      Past Medical History:  Diagnosis Date  . Atrial fibrillation ([place])   . Bradycardia   . CAD S/P percutaneous coronary angioplasty 1993   POBA to Cx x 2 occasions; mild to moderate/nonobstructive CAD on preop cath August 2014  . Dyslipidemia, goal LDL below 70   .  Former heavy tobacco smoker     quit in October 2014  . GERD (gastroesophageal reflux disease)   . Hypertension   . Olecranon bursitis of left elbow   . Osteoarthritis   . Peripheral vascular disease ([place])     H/O R SFA stent, L Iliac Stent 1996;;  Lower Extremity Angiography April 2015: Occluded left SFA reconstituting at end of the canal, heavily calcified. Three-vessel runoff..;; RCA 50% ostial right common iliac, patent SFA stent -> Referred to Dr. Trula Slade of [facility]  . S/P AVR (aortic valve replacement) 02/10/2013   [address] Magna Ease Pericardial Tissue Valve  . Severe aortic stenosis by prior echocardiogram    per Dr. Rollene Fare 2012; referred for aortic valve replacement  . Shortness of breath    Chronic  . Trigger ring finger of left hand   . Tubular adenoma of colon   . VT (ventricular tachycardia) ([place])    hemodynamically significant VT on 11/07/2015, nonobstructive CAD, no culprit lesion, underwent [employer] Dual Chamber ICD by Dr. [name]    ROS:   All systems reviewed and negative except as noted in the HPI.   Past Surgical History:  Procedure Laterality Date  . ABDOMINAL ANGIOGRAM  09/10/2013   Procedure: ABDOMINAL ANGIOGRAM;  Surgeon: [name], MD;  Location: [facility];  Service: Cardiovascular;;  . AORTIC VALVE REPLACEMENT  02/10/2013   Dr. Servando Snare: [address] Ease Pericardia  . AORTIC VALVE REPLACEMENT N/A 02/10/2013   AORTIC VALVE REPLACEMENT (AVR);  Surgeon: Grace Isaac, MD;  Location: [facility];  Service: Open Heart Surgery;  Laterality: N/A  . ARTERIAL DOPPLER - PRE-CABG  02/06/2013   Bilateral 1-39% ICA stenosis  . CARDIAC CATHETERIZATION  August 2014   Nonobstructive 30-50% RCA. No significant LAD disease. 30% mid circumflex.  [name] CARDIAC CATHETERIZATION  11/10/2009   No intervention - recommend medical management  . CARDIAC CATHETERIZATION N/A 11/09/2015   Procedure: Left Heart Cath and Coronary Angiography;  Surgeon: Belva Crome, MD;  Location:  [facility];  Service: Cardiovascular;  Laterality: N/A;  . CARDIOVASCULAR STRESS TEST  06/24/2012   Diffuse LV hypokinesis with moderately depressed systolic function and EF 32%,  . CORONARY ANGIOPLASTY  1993   Circumflex x 2   . EP IMPLANTABLE DEVICE N/A 11/10/2015   Procedure: ICD Implant;  Surgeon: Evans Lance, MD;  Location: [facility];  Service: Cardiovascular;  Laterality: N/A;  . ILIAC ARTERY STENT Left 1996  . INTRAOPERATIVE TRANSESOPHAGEAL ECHOCARDIOGRAM N/A 02/10/2013   Procedure: INTRAOPERATIVE TRANSESOPHAGEAL ECHOCARDIOGRAM;  Surgeon: Grace Isaac, MD;  Location: [facility];  Service: Open Heart Surgery;  Laterality: N/A;  . LEFT AND RIGHT HEART CATHETERIZATION WITH CORONARY ANGIOGRAM N/A 01/28/2013   Procedure: LEFT AND RIGHT HEART CATHETERIZATION WITH CORONARY ANGIOGRAM;  Surgeon: Leonie Man, MD;  Location: [facility] C[facility];  Service: Cardiovascular;  Laterality: N/A;  . LOWER EXTREMITY ANGIOGRAM N/A 09/10/2013   Procedure: LOWER EXTREMITY ANGIOGRAM;  Surgeon: [name], MD;  Location: [facility];  Service: Cardiovascular;  Laterality: N/A;  . OLECRANON BURSECTOMY  07/23/2011   Procedure: OLECRANON BURSA;  Surgeon: Lorn Junes, MD;  Location: [facility];  Service: Orthopedics;  Laterality: Left;  excision olecranon bursa left elbow patient had supraclaviular block in preop  . PERIPHERAL VASCULAR ANGIOGRAM  07/19/1994   No intervention-recommend medical therapy  . PERIPHERAL VASCULAR ANGIOGRAM  01/17/1995   Right SFA 80-90# lesion, dilatation performed with a Cordis "Optz 5" 16m x 2cm, inflated a 4-7.5atm for 40 to 70 seconds. Completion angiogram done by hand with "bolus chase" runoff from Primal SFA to foot. [place] in reduction of 80-90% to 20% with excellent flow and without dissection  . PERIPHERAL VASCULAR ANGIOGRAM  09/10/2013   Occluded left SFA, heavily calcified with reconstitution at adductor canal. Three-vessel runof.;; Right lower  extremity noted 50% ostial right common iliac stenosis with roughly 20 mm gradient. Not considered significant.f  . right hand surgery    . ROTATOR CUFF REPAIR Right   . Superficial Femoral Atery stent Right 1996  . TONSILLECTOMY     as child  . TRANSTHORACIC ECHOCARDIOGRAM  03/23/2013   Post-Op Echo #1: EF 50-55%, Mild Conc LVH, Mild HK of Inferolateral wall with paradoxical septal motion (c/w post-op state); Well seated 23 mm Bioprosthetic Valve (area ~1.23 cm2 - normal for valve); Mild MR  . TRANSTHORACIC ECHOCARDIOGRAM  March 2017   EF 50-55%. No RWMA.  GR 1 DD.  23 mm bioprosthetic valve functioning normally.  Mild biatrial dilation.  .Marland KitchenTRIGGER FINGER RELEASE  07/23/2011   Procedure: RELEASE TRIGGER FINGER/A-1 PULLEY;  Surgeon: RLorn Junes MD;  Location: [facility]  Service: Orthopedics;  Laterality: Left;  release trigger left ring finger patient had supraclavicular  block in preop     Family History  Problem Relation Age of Onset  . Hypertension Mother   . Cancer Mother     Oral  . Stroke Father      Social History   Social History  . Marital status: Married    Spouse name: N/A  . Number of children: N/A  . Years of education: N/A   Occupational History  . Not on file.   Social History Main Topics  . Smoking status: Former Smoker    Packs/day: 0.50    Years: 60.00    Types: Cigarettes    Quit date: 01/31/2013  . Smokeless tobacco: Never Used  . Alcohol use No  . Drug use: No  . Sexual activity: Not on file   Other Topics Concern  . Not on file   Social History Narrative   Married with no children. He long-term smoker who quit prior to his aVR in 2014.    Up until his operation he worked part-time at [employer].  He is not yet gone back to work postoperatively.   He chose not be cardiac rehabilitation, but has gone back to walking exercising daily.     BP 130/80   Pulse 60   Ht '5\' 5"'$  (1.651 m)   Wt 150 lb 6.4 oz (68.2 kg)    BMI 25.03 kg/m   Physical Exam:  Well appearing 81 yo man, NAD HEENT: Unremarkable Neck:  No JVD, no thyromegally Lymphatics:  No adenopathy Back:  No CVA tenderness Lungs:  Clear with no wheezes HEART:  Regular rate rhythm, no murmurs, no rubs, no clicks Abd:  soft, positive bowel sounds, no organomegally, no rebound, no guarding Ext:  2 plus pulses, no edema, no cyanosis, no clubbing Skin:  No rashes no nodules Neuro:  CN II through XII intact, motor grossly intact  EKG - NSR with RBBB  DEVICE  Normal device function.  See PaceArt for details.   Assess/Plan: 1. VT - he has had no recurrent VT. He will continue his amiodarone. 2. Chronic systolic heart failure - his symptoms remain class 2. Will follow. 3. HTN - his blood pressure is controlled. Will follow. 4. ICD - we have reprogrammed his device today to reduce ventricular pacing. His device is working normally.  Mikle Bosworth.D.

## 2016-02-08 ENCOUNTER — Encounter: Payer: Medicare Other | Admitting: Internal Medicine

## 2016-03-23 ENCOUNTER — Other Ambulatory Visit: Payer: Self-pay | Admitting: Cardiology

## 2016-05-02 ENCOUNTER — Telehealth: Payer: Self-pay | Admitting: Cardiology

## 2016-05-02 ENCOUNTER — Encounter: Payer: Medicare Other | Admitting: *Deleted

## 2016-05-02 NOTE — Telephone Encounter (Signed)
LMOVM reminding pt to send remote transmission.   

## 2016-05-07 ENCOUNTER — Other Ambulatory Visit: Payer: Self-pay | Admitting: Physician Assistant

## 2016-05-11 ENCOUNTER — Encounter: Payer: Self-pay | Admitting: Cardiology

## 2016-05-15 ENCOUNTER — Encounter: Payer: Self-pay | Admitting: Internal Medicine

## 2016-05-18 ENCOUNTER — Telehealth: Payer: Self-pay | Admitting: Internal Medicine

## 2016-05-18 NOTE — Telephone Encounter (Signed)
New message    Pt wife verbalized that she is returning call for device rn

## 2016-05-21 ENCOUNTER — Ambulatory Visit (INDEPENDENT_AMBULATORY_CARE_PROVIDER_SITE_OTHER): Payer: Medicare Other | Admitting: *Deleted

## 2016-05-21 DIAGNOSIS — I472 Ventricular tachycardia, unspecified: Secondary | ICD-10-CM

## 2016-05-21 NOTE — Telephone Encounter (Signed)
Helped pt and his wife send remote transmission. Transmission successful pt and wife aware.

## 2016-05-21 NOTE — Telephone Encounter (Signed)
Spoke w/ pt wife gave her my direct number and she is going to call back once pt is back home.

## 2016-05-23 NOTE — Progress Notes (Signed)
Remote ICD transmission.   

## 2016-05-25 ENCOUNTER — Encounter: Payer: Self-pay | Admitting: Cardiology

## 2016-05-31 ENCOUNTER — Inpatient Hospital Stay: Admission: AD | Admit: 2016-05-31 | Payer: Medicare Other | Source: Ambulatory Visit | Admitting: Internal Medicine

## 2016-05-31 ENCOUNTER — Telehealth: Payer: Self-pay

## 2016-05-31 ENCOUNTER — Ambulatory Visit (HOSPITAL_COMMUNITY)
Admission: RE | Admit: 2016-05-31 | Discharge: 2016-05-31 | Disposition: A | Discharge status: Home / Self Care | Payer: Medicare Other | Source: Ambulatory Visit | Attending: Internal Medicine | Admitting: Internal Medicine

## 2016-05-31 ENCOUNTER — Ambulatory Visit (INDEPENDENT_AMBULATORY_CARE_PROVIDER_SITE_OTHER): Payer: Medicare Other | Admitting: Internal Medicine

## 2016-05-31 ENCOUNTER — Encounter (HOSPITAL_COMMUNITY)
Admission: RE | Disposition: A | Discharge status: Home / Self Care | Payer: Self-pay | Source: Ambulatory Visit | Attending: Internal Medicine

## 2016-05-31 DIAGNOSIS — Z87891 Personal history of nicotine dependence: Secondary | ICD-10-CM | POA: Diagnosis not present

## 2016-05-31 DIAGNOSIS — Z7902 Long term (current) use of antithrombotics/antiplatelets: Secondary | ICD-10-CM | POA: Diagnosis not present

## 2016-05-31 DIAGNOSIS — I130 Hypertensive heart and chronic kidney disease with heart failure and stage 1 through stage 4 chronic kidney disease, or unspecified chronic kidney disease: Secondary | ICD-10-CM | POA: Diagnosis not present

## 2016-05-31 DIAGNOSIS — Z7982 Long term (current) use of aspirin: Secondary | ICD-10-CM | POA: Insufficient documentation

## 2016-05-31 DIAGNOSIS — Z95810 Presence of automatic (implantable) cardiac defibrillator: Secondary | ICD-10-CM | POA: Diagnosis not present

## 2016-05-31 DIAGNOSIS — I255 Ischemic cardiomyopathy: Secondary | ICD-10-CM | POA: Diagnosis not present

## 2016-05-31 DIAGNOSIS — E785 Hyperlipidemia, unspecified: Secondary | ICD-10-CM | POA: Insufficient documentation

## 2016-05-31 DIAGNOSIS — I480 Paroxysmal atrial fibrillation: Secondary | ICD-10-CM | POA: Diagnosis not present

## 2016-05-31 DIAGNOSIS — K219 Gastro-esophageal reflux disease without esophagitis: Secondary | ICD-10-CM | POA: Insufficient documentation

## 2016-05-31 DIAGNOSIS — Z800 Family history of malignant neoplasm of digestive organs: Secondary | ICD-10-CM | POA: Diagnosis not present

## 2016-05-31 DIAGNOSIS — Z4502 Encounter for adjustment and management of automatic implantable cardiac defibrillator: Secondary | ICD-10-CM | POA: Diagnosis present

## 2016-05-31 DIAGNOSIS — Z823 Family history of stroke: Secondary | ICD-10-CM | POA: Insufficient documentation

## 2016-05-31 DIAGNOSIS — N183 Chronic kidney disease, stage 3 (moderate): Secondary | ICD-10-CM | POA: Insufficient documentation

## 2016-05-31 DIAGNOSIS — M1990 Unspecified osteoarthritis, unspecified site: Secondary | ICD-10-CM | POA: Diagnosis not present

## 2016-05-31 DIAGNOSIS — Z8249 Family history of ischemic heart disease and other diseases of the circulatory system: Secondary | ICD-10-CM | POA: Insufficient documentation

## 2016-05-31 DIAGNOSIS — I2510 Atherosclerotic heart disease of native coronary artery without angina pectoris: Secondary | ICD-10-CM | POA: Diagnosis not present

## 2016-05-31 DIAGNOSIS — I472 Ventricular tachycardia: Secondary | ICD-10-CM | POA: Insufficient documentation

## 2016-05-31 DIAGNOSIS — I509 Heart failure, unspecified: Secondary | ICD-10-CM | POA: Insufficient documentation

## 2016-05-31 DIAGNOSIS — I739 Peripheral vascular disease, unspecified: Secondary | ICD-10-CM | POA: Insufficient documentation

## 2016-05-31 DIAGNOSIS — Z952 Presence of prosthetic heart valve: Secondary | ICD-10-CM | POA: Insufficient documentation

## 2016-05-31 DIAGNOSIS — Z955 Presence of coronary angioplasty implant and graft: Secondary | ICD-10-CM | POA: Diagnosis not present

## 2016-05-31 HISTORY — PX: ICD GENERATOR CHANGEOUT: EP1231

## 2016-05-31 LAB — CUP PACEART INCLINIC DEVICE CHECK
Implantable Lead Implant Date: 20170601
Implantable Lead Location: 753859
Implantable Lead Model: 7122
Implantable Pulse Generator Implant Date: 20170601
MDC IDC LEAD IMPLANT DT: 20170601
MDC IDC LEAD LOCATION: 753860
MDC IDC SESS DTM: 20171221123712
Pulse Gen Serial Number: 7362271

## 2016-05-31 LAB — BASIC METABOLIC PANEL
ANION GAP: 7 (ref 5–15)
BUN: 25 mg/dL — ABNORMAL HIGH (ref 6–20)
CALCIUM: 10.1 mg/dL (ref 8.9–10.3)
CHLORIDE: 105 mmol/L (ref 101–111)
CO2: 22 mmol/L (ref 22–32)
Creatinine, Ser: 1.86 mg/dL — ABNORMAL HIGH (ref 0.61–1.24)
GFR calc non Af Amer: 33 mL/min — ABNORMAL LOW (ref >60)
GFR, EST AFRICAN AMERICAN: 38 mL/min — AB (ref >60)
Glucose, Bld: 97 mg/dL (ref 65–99)
Potassium: 4.5 mmol/L (ref 3.5–5.1)
Sodium: 134 mmol/L — ABNORMAL LOW (ref 135–145)

## 2016-05-31 LAB — CBC
HEMATOCRIT: 39 % (ref 39.0–52.0)
HEMOGLOBIN: 13.2 g/dL (ref 13.0–17.0)
MCH: 30.7 pg (ref 26.0–34.0)
MCHC: 33.8 g/dL (ref 30.0–36.0)
MCV: 90.7 fL (ref 78.0–100.0)
Platelets: 198 10*3/uL (ref 150–400)
RBC: 4.3 MIL/uL (ref 4.22–5.81)
RDW: 15.5 % (ref 11.5–15.5)
WBC: 8 10*3/uL (ref 4.0–10.5)

## 2016-05-31 LAB — SURGICAL PCR SCREEN
MRSA, PCR: NEGATIVE
STAPHYLOCOCCUS AUREUS: NEGATIVE

## 2016-05-31 SURGERY — ICD GENERATOR CHANGEOUT
Anesthesia: LOCAL

## 2016-05-31 MED ORDER — SODIUM CHLORIDE 0.9 % IR SOLN
Status: DC | PRN
  Administered 2016-05-31: 17:00:00

## 2016-05-31 MED ORDER — CEFAZOLIN SODIUM-DEXTROSE 2-4 GM/100ML-% IV SOLN
INTRAVENOUS | Status: AC
  Filled 2016-05-31: qty 100

## 2016-05-31 MED ORDER — LIDOCAINE HCL (PF) 1 % IJ SOLN
INTRAMUSCULAR | Status: AC
  Filled 2016-05-31: qty 60

## 2016-05-31 MED ORDER — CEFAZOLIN SODIUM-DEXTROSE 2-4 GM/100ML-% IV SOLN
2.0000 g | INTRAVENOUS | Status: AC
  Administered 2016-05-31: 2 g via INTRAVENOUS

## 2016-05-31 MED ORDER — CHLORHEXIDINE GLUCONATE 4 % EX LIQD: 60.0000 mL | Freq: Once | CUTANEOUS | Status: DC

## 2016-05-31 MED ORDER — SODIUM CHLORIDE 0.9% FLUSH: 3.0000 mL | Freq: Two times a day (BID) | INTRAVENOUS | Status: DC

## 2016-05-31 MED ORDER — GENTAMICIN SULFATE 40 MG/ML IJ SOLN
80.0000 mg | INTRAMUSCULAR | Status: DC
  Filled 2016-05-31: qty 2

## 2016-05-31 MED ORDER — MUPIROCIN 2 % EX OINT
TOPICAL_OINTMENT | CUTANEOUS | Status: AC
  Administered 2016-05-31: 1 via TOPICAL
  Filled 2016-05-31: qty 22

## 2016-05-31 MED ORDER — SODIUM CHLORIDE 0.9 % IV SOLN
250.0000 mL | INTRAVENOUS | Status: DC | PRN
Start: 2016-05-31 — End: 2016-05-31

## 2016-05-31 MED ORDER — LIDOCAINE HCL (PF) 1 % IJ SOLN
INTRAMUSCULAR | Status: DC | PRN
  Administered 2016-05-31: 20 mL via INTRADERMAL

## 2016-05-31 MED ORDER — ACETAMINOPHEN 325 MG PO TABS
325.0000 mg | ORAL_TABLET | ORAL | Status: DC | PRN
  Filled 2016-05-31: qty 2

## 2016-05-31 MED ORDER — MUPIROCIN 2 % EX OINT
1.0000 "application " | TOPICAL_OINTMENT | Freq: Once | CUTANEOUS | Status: AC
  Administered 2016-05-31: 1 via TOPICAL
  Filled 2016-05-31: qty 22

## 2016-05-31 MED ORDER — SODIUM CHLORIDE 0.9 % IV SOLN
INTRAVENOUS | Status: DC
  Administered 2016-05-31: 14:00:00 via INTRAVENOUS

## 2016-05-31 MED ORDER — ONDANSETRON HCL 4 MG/2ML IJ SOLN: 4.0000 mg | Freq: Four times a day (QID) | INTRAMUSCULAR | Status: DC | PRN

## 2016-05-31 MED ORDER — SODIUM CHLORIDE 0.9 % IR SOLN
Status: AC
  Filled 2016-05-31: qty 2

## 2016-05-31 MED ORDER — SODIUM CHLORIDE 0.9% FLUSH: 3.0000 mL | INTRAVENOUS | Status: DC | PRN

## 2016-05-31 MED ORDER — AMIODARONE HCL 200 MG PO TABS: 200.0000 mg | ORAL_TABLET | Freq: Every day | ORAL | 3 refills | Status: DC

## 2016-05-31 SURGICAL SUPPLY — 6 items
CABLE SURGICAL S-101-97-12 (CABLE) ×1 IMPLANT
ICD ELLIPSE DR CD2411-36C (ICD Generator) ×1 IMPLANT
PAD DEFIB LIFELINK (PAD) ×2 IMPLANT
POUCH AIGIS-R ANTIBACT ICD (Mesh General) ×2 IMPLANT
POUCH AIGIS-R ANTIBACT ICD LRG (Mesh General) IMPLANT
TRAY PACEMAKER INSERTION (PACKS) ×2 IMPLANT

## 2016-05-31 NOTE — Telephone Encounter (Signed)
Alert "capacitor charge time limit reached" reviewed with [employer] of [employer]. Needs further investigation in office. Ralph Drake is agreeable to come to the [facility] office today at 11am.

## 2016-05-31 NOTE — Patient Instructions (Signed)
Medication Instructions: - Your physician recommends that you continue on your current medications as directed. Please refer to the Current Medication list given to you today.  Labwork: - none ordered  Procedures/Testing: - none ordered  Follow-Up: - Please proceed directly over to the [employer], Entrance "A" at [facility]- once you enter, proceed directly down the hall way to Admitting to check in. This will be on your left.  - Do not eat or drink anything   Any Additional Special Instructions Will Be Listed Below (If Applicable).     If you need a refill on your cardiac medications before your next appointment, please call your pharmacy.

## 2016-05-31 NOTE — Telephone Encounter (Signed)
Pt wife called and stated that pt device vibrated twice. Instructed pt wife to send a remote transmission. Transmission received. After discussing with several device tech nurses, informed pt wife that device tech nurse is going to review w/ MD and representative from [employer] and call the pt back with a plan. Pt wife verbalized understanding.

## 2016-05-31 NOTE — Interval H&P Note (Signed)
History and Physical Interval Note:  05/31/2016 4:25 PM  ICD Criteria  Current LVEF:55%. Within 12 months prior to implant: Yes   Heart failure history: Yes, Class I  Cardiomyopathy history: No.  Atrial Fibrillation/Atrial Flutter: Yes, Paroxysmal.  Ventricular tachycardia history: Yes, No hemodynamic instability. VT Type: Sustained Ventricular Tachycardia - Monomorphic.  Cardiac arrest history: Yes, Ventricular Tachycardia.  History of syndromes with risk of sudden death: No.  Previous ICD: Yes, Reason for ICD:  Secondary prevention.  Current ICD indication: Secondary  PPM indication: Yes. Pacing type: Atrial. Greater than 40% RV pacing requirement anticipated. Indication: Sick Sinus Syndrome   Class I or II Bradycardia indication present: Yes  Beta Blocker therapy for 3 or more months: Yes, prescribed.   Ace Inhibitor/ARB therapy for 3 or more months: Yes, prescribed.      Ralph Drake  has presented today for surgery, with the diagnosis of ERI  The various methods of treatment have been discussed with the patient and family. After consideration of risks, benefits and other options for treatment, the patient has consented to  Procedure(s): ICD [employer] (N/A) as a surgical intervention .  The patient's history has been reviewed, patient examined, no change in status, stable for surgery.  I have reviewed the patient's chart and labs.  Questions were answered to the patient's satisfaction.     Thompson Grayer

## 2016-05-31 NOTE — Discharge Instructions (Signed)
Keep incision clean and dry, no showers until cleared to at your wound check appointment No driving for 2 days You can remove outer dressing tomorrow. Leave steri-strips (little pieces of tape) on until seen in the office for wound check appointment. Call the office [phone]) for redness, drainage, swelling, or fever.     Pacemaker Battery Change, Care After Refer to this sheet in the next few weeks. These instructions provide you with information on caring for yourself after your procedure. Your health care provider may also give you more specific instructions. Your treatment has been planned according to current medical practices, but problems sometimes occur. Call your health care provider if you have any problems or questions after your procedure. WHAT TO EXPECT AFTER THE PROCEDURE After your procedure, it is typical to have the following sensations:  Soreness at the pacemaker site. HOME CARE INSTRUCTIONS   Keep the incision clean and dry (see above).  For the first week after the replacement, avoid stretching motions that pull at the incision site, and avoid heavy exercise with the arm that is on the same side as the incision.  Take medicines only as directed by your health care provider.  Keep all follow-up visits as directed by your health care provider. SEEK MEDICAL CARE IF:   You have pain at the incision site that is not relieved by over-the-counter or prescription medicine.  There is drainage or pus from the incision site.  There is swelling larger than a lime at the incision site.  You develop red streaking that extends above or below the incision site.  You feel brief, intermittent palpitations, light-headedness, or any symptoms that you feel might be related to your heart. SEEK IMMEDIATE MEDICAL CARE IF:   You experience chest pain that is different than the pain at the pacemaker site.  You experience shortness of breath.  You have palpitations or irregular  heartbeat.  You have light-headedness that does not go away quickly.  You faint.  You have pain that gets worse and is not relieved by medicine. This information is not intended to replace advice given to you by your health care provider. Make sure you discuss any questions you have with your health care provider. Document Released: 03/18/2013 Document Revised: 06/18/2014 Document Reviewed: 03/18/2013 Elsevier Interactive Patient Education  2017 [employer].

## 2016-05-31 NOTE — H&P (Signed)
H&P    Patient ID: Ralph Drake MRN: [id number], DOB/AGE: 11-27-1934 81 y.o.  Admit date: 05/31/2016 Date of Admit: 05/31/2016  Primary Physician: [name], MD Primary Cardiologist: Dr. [name] Electrophysiologist: Dr. [name]  Reason for visit: ICD generator change  HPI: Ralph Drake is a 81 y.o. male with history of CAD (coronary angioplasty x 2 1993), severe AS s/p 23 Edwards Magna Ease Pericardial Tissue Valve 2014, PAD (SFA stent 1996, SFA occlusion 2015), HTN, HLD, bradycardia, known solitary kidney with baseline stage III CKD, and h/o post op PAF without reccurence and not on AC.  He was admitted from 5/30-6/2 after admitted with sustained VT. This strip showed a regular wide complex acardia resolved after 2 amiodarone bolus. Patient was seen by Dr. [name] on 11/08/2015, given hemodynamically unstable VT that terminated with IV amiodarone, was felt the patient needed ischemic event to rule out underlying cause. He underwent cardiac catheterization on 11/09/2015 which showed 50% proximal left circumflex lesion, 40% mid LAD lesion, 70% distal RCA lesion, 40% mid RCA lesion. There was no obvious culprit for VT based on coronary anatomy unless there was thrombosis of the RCA with reperfusion however this is unlikely given the very trivial increase in the troponin. Due to lack of obvious cause, patient underwent St. Jude dual-chamber ICD implantation by Dr. [name] on 11/10/2015. He was eventually discharged on 11/11/2015, prior to the discharge, his potassium was elevated, his lisinopril was decreased to half. He was recommended to start Plavix for his CAD, this was started today after discharge. He was seen by cardiology service Almyra Deforest [facility] 6/14./17 at that time doing well.  Unfortunately re-admitted 11/29/15 with a VT storm with multiple ICD shocks, started on IV amiodarone and transitioned to PO and discharged 12/02/15 on amiodarone '400mg'$  BID.    Last night he felt his ICD vibrate  and again this morning, he sent today a transmission noting an alert of "capacitor charge time limit reached on May 30, 2016" he was asked to come in to the office and in review with industry, noting the capacitor had failed, given his hx of VT storm 6 months ago, felt generator change needed to be done today.  He has been doing well, not much energy, this is is not new,  no CP, palpitations, no SOB, no dizziness, near syncope or syncope, no N/V/D, no recent illness, fever.     DEVICE history STJ, dual chamber ICD, implanted 11/10/15, Dr. [name] 11/29/15 VT storm with multiple shocks AAD tx: amiodarone  Past Medical History:  Diagnosis Date  . Atrial fibrillation ([place])   . Bradycardia   . CAD S/P percutaneous coronary angioplasty 1993   POBA to Cx x 2 occasions; mild to moderate/nonobstructive CAD on preop cath August 2014  . Dyslipidemia, goal LDL below 70   . Former heavy tobacco smoker     quit in October 2014  . GERD (gastroesophageal reflux disease)   . Hypertension   . Olecranon bursitis of left elbow   . Osteoarthritis   . Peripheral vascular disease ([place])     H/O R SFA stent, L Iliac Stent 1996;;  Lower Extremity Angiography April 2015: Occluded left SFA reconstituting at end of the canal, heavily calcified. Three-vessel runoff..;; RCA 50% ostial right common iliac, patent SFA stent -> Referred to Dr. Trula Slade of [facility].  . S/P AVR (aortic valve replacement) 02/10/2013   23 Edwards Magna Ease Pericardial Tissue Valve  . Severe aortic stenosis by prior echocardiogram  per Dr. Rollene Fare 2012; referred for aortic valve replacement  . Shortness of breath    Chronic  . Trigger ring finger of left hand   . Tubular adenoma of colon   . VT (ventricular tachycardia) ([place])    hemodynamically significant VT on 11/07/2015, nonobstructive CAD, no culprit lesion, underwent St Jude Dual Chamber ICD by Dr. [name]     Surgical History:  Past Surgical History:  Procedure Laterality Date  .  ABDOMINAL ANGIOGRAM  09/10/2013   Procedure: ABDOMINAL ANGIOGRAM;  Surgeon: [name], MD;  Location: [facility] CATH LAB;  Service: Cardiovascular;;  . AORTIC VALVE REPLACEMENT  02/10/2013   Dr. Servando Snare: [address] Ease Pericardia  . AORTIC VALVE REPLACEMENT N/A 02/10/2013   AORTIC VALVE REPLACEMENT (AVR);  Surgeon: Grace Isaac, MD;  Location: [place];  Service: Open Heart Surgery;  Laterality: N/A  . ARTERIAL DOPPLER - PRE-CABG  02/06/2013   Bilateral 1-39% ICA stenosis  . CARDIAC CATHETERIZATION  August 2014   Nonobstructive 30-50% RCA. No significant LAD disease. 30% mid circumflex.  Ralph Drake CARDIAC CATHETERIZATION  11/10/2009   No intervention - recommend medical management  . CARDIAC CATHETERIZATION N/A 11/09/2015   Procedure: Left Heart Cath and Coronary Angiography;  Surgeon: Belva Crome, MD;  Location: [place] CV LAB;  Service: Cardiovascular;  Laterality: N/A;  . CARDIOVASCULAR STRESS TEST  06/24/2012   Diffuse LV hypokinesis with moderately depressed systolic function and EF 61%,  . CORONARY ANGIOPLASTY  1993   Circumflex x 2   . EP IMPLANTABLE DEVICE N/A 11/10/2015   Procedure: ICD Implant;  Surgeon: Evans Lance, MD;  Location: [place] CV LAB;  Service: Cardiovascular;  Laterality: N/A;  . ILIAC ARTERY STENT Left 1996  . INTRAOPERATIVE TRANSESOPHAGEAL ECHOCARDIOGRAM N/A 02/10/2013   Procedure: INTRAOPERATIVE TRANSESOPHAGEAL ECHOCARDIOGRAM;  Surgeon: Grace Isaac, MD;  Location: [place];  Service: Open Heart Surgery;  Laterality: N/A;  . LEFT AND RIGHT HEART CATHETERIZATION WITH CORONARY ANGIOGRAM N/A 01/28/2013   Procedure: LEFT AND RIGHT HEART CATHETERIZATION WITH CORONARY ANGIOGRAM;  Surgeon: Leonie Man, MD;  Location: [facility] CATH LAB;  Service: Cardiovascular;  Laterality: N/A;  . LOWER EXTREMITY ANGIOGRAM N/A 09/10/2013   Procedure: LOWER EXTREMITY ANGIOGRAM;  Surgeon: [name], MD;  Location: [facility] CATH LAB;  Service: Cardiovascular;  Laterality: N/A;  . OLECRANON  BURSECTOMY  07/23/2011   Procedure: OLECRANON BURSA;  Surgeon: Lorn Junes, MD;  Location: [place];  Service: Orthopedics;  Laterality: Left;  excision olecranon bursa left elbow patient had supraclaviular block in preop  . PERIPHERAL VASCULAR ANGIOGRAM  07/19/1994   No intervention-recommend medical therapy  . PERIPHERAL VASCULAR ANGIOGRAM  01/17/1995   Right SFA 80-90# lesion, dilatation performed with a Cordis "Optz 5" 65m x 2cm, inflated a 4-7.5atm for 40 to 70 seconds. Completion angiogram done by hand with "bolus chase" runoff from Primal SFA to foot. Resutling in reduction of 80-90% to 20% with excellent flow and without dissection  . PERIPHERAL VASCULAR ANGIOGRAM  09/10/2013   Occluded left SFA, heavily calcified with reconstitution at adductor canal. Three-vessel runof.;; Right lower extremity noted 50% ostial right common iliac stenosis with roughly 20 mm gradient. Not considered significant.f  . right hand surgery    . ROTATOR CUFF REPAIR Right   . Superficial Femoral Atery stent Right 1996  . TONSILLECTOMY     as child  . TRANSTHORACIC ECHOCARDIOGRAM  03/23/2013   Post-Op Echo #1: EF 50-55%, Mild Conc LVH, Mild HK  of Inferolateral wall with paradoxical septal motion (c/w post-op state); Well seated 23 mm Bioprosthetic Valve (area ~1.23 cm2 - normal for valve); Mild MR  . TRANSTHORACIC ECHOCARDIOGRAM  March 2017   EF 50-55%. No RWMA.  GR 1 DD.  23 mm bioprosthetic valve functioning normally.  Mild biatrial dilation.  Ralph Drake TRIGGER FINGER RELEASE  07/23/2011   Procedure: RELEASE TRIGGER FINGER/A-1 PULLEY;  Surgeon: Lorn Junes, MD;  Location: [place];  Service: Orthopedics;  Laterality: Left;  release trigger left ring finger patient had supraclavicular block in preop     Prescriptions Prior to Admission  Medication Sig Dispense Refill Last Dose  . amiodarone (PACERONE) 200 MG tablet Take 1 tablet (200 mg total) by mouth 2 (two) times daily. 180  tablet 3   . amLODipine (NORVASC) 10 MG tablet Take 1 tablet (10 mg total) by mouth daily. 90 tablet 3 Taking  . aspirin 81 MG EC tablet Take 1 tablet (81 mg total) by mouth daily.   Taking  . atorvastatin (LIPITOR) 80 MG tablet Take 1 tablet (80 mg total) by mouth daily. 90 tablet 3 Taking  . cilostazol (PLETAL) 100 MG tablet Take 1 tablet (100 mg total) by mouth 2 (two) times daily. 180 tablet 3 Taking  . clopidogrel (PLAVIX) 75 MG tablet TAKE 1 TABLET BY MOUTH  DAILY 90 tablet 3   . ezetimibe (ZETIA) 10 MG tablet Take 1 tablet (10 mg total) by mouth daily. 90 tablet 3 Taking  . fish oil-omega-3 fatty acids 1000 MG capsule Take 1 g by mouth 2 (two) times daily.    Taking  . hydrochlorothiazide (MICROZIDE) 12.5 MG capsule Take 1 capsule (12.5 mg total) by mouth daily. 90 capsule 3 Taking  . lisinopril (PRINIVIL) 10 MG tablet Take 1 tablet (10 mg total) by mouth daily. 30 tablet 3 Taking  . metoprolol tartrate (LOPRESSOR) 25 MG tablet Take 0.5 tablets (12.5 mg total) by mouth 2 (two) times daily. 90 tablet 3 Taking  . metoprolol tartrate (LOPRESSOR) 25 MG tablet TAKE ONE-HALF TABLET BY  MOUTH TWO TIMES DAILY 90 tablet 2   . Multiple Vitamins-Minerals (MULTIVITAMIN WITH MINERALS) tablet Take 1 tablet by mouth daily.   Taking  . vitamin E 400 UNIT capsule Take 2 capsules by mouth daily.   Taking    Inpatient Medications:  . mupirocin ointment      .  ceFAZolin (ANCEF) IV  2 g Intravenous On Call  . chlorhexidine  60 mL Topical Once  . gentamicin irrigation  80 mg Irrigation On Call    Allergies: No Known Allergies  Social History   Social History  . Marital status: Married    Spouse name: N/A  . Number of children: N/A  . Years of education: N/A   Occupational History  . Not on file.   Social History Main Topics  . Smoking status: Former Smoker    Packs/day: 0.50    Years: 60.00    Types: Cigarettes    Quit date: 01/31/2013  . Smokeless tobacco: Never Used  . Alcohol use No    . Drug use: No  . Sexual activity: Not on file   Other Topics Concern  . Not on file   Social History Narrative   Married with no children. He long-term smoker who quit prior to his aVR in 2014.    Up until his operation he worked part-time at [employer].  He is not yet gone back to work postoperatively.  He chose not be cardiac rehabilitation, but has gone back to walking exercising daily.     Family History  Problem Relation Age of Onset  . Hypertension Mother   . Cancer Mother     Oral  . Stroke Father      Review of Systems: All other systems reviewed and are otherwise negative except as noted above.  Physical Exam: Vitals:   05/31/16 1256  BP: (!) 145/77  Pulse: 63  Resp: 18  Temp: 98 F (36.7 C)  TempSrc: Oral  SpO2: 97%  Weight: 150 lb (68 kg)  Height: '5\' 5"'$  (1.651 m)    GEN- The patient is well appearing, alert and oriented x 3 today.   HEENT: normocephalic, atraumatic; sclera clear, conjunctiva pink; hearing intact; oropharynx clear; neck supple, no JVP Lymph- no cervical lymphadenopathy Lungs- Clear to ausculation bilaterally, normal work of breathing.  No wheezes, rales, rhonchi Heart- Regular rate and rhythm, 1/6 SM, rubs or gallops, PMI not laterally displaced GI- soft, non-tender, non-distended Extremities- no clubbing, cyanosis, or edema MS- no significant deformity or atrophy Skin- warm and dry, no rash or lesion Psych- euthymic mood, full affect Neuro- no gross deficits observed  Labs:   Lab Results  Component Value Date   WBC 6.1 11/29/2015   HGB 12.7 (L) 11/29/2015   HCT 38.0 (L) 11/29/2015   MCV 90.7 11/29/2015   PLT 206 11/29/2015         Assessment and Plan:   1.  Hx of VT recurrent with multiple ICD therapies      The patient's ICD gave him a vibratory alert yesterday and this morning, he was seen in the office device clinic noting "capacitor charge time limit reached on May 30, 2016" alert. "Last Max Charge" on  remote indicates 7.3 seconds. Capacitor charged and dumped in office multiple times- per [employer] the Management consultant has failed. Per Dr. Caryl Comes the device needs to be replaced.  Plan for ICD generator change today with Dr. Rayann Heman, risks/benefits of the procedure discussed with the patient he is agreeable to proceed.  He last ate a bagel at about 0800 this morning, nothing since.  Wound care has been discussed with the patient Wound check visit arranged       Signed, Tommye Standard, PA-C 05/31/2016 1:18 PM  Risks, benefits, and alternatives to ICD pulse generator replacement were discussed in detail today.  The patient understands that risks include but are not limited to bleeding, infection, pneumothorax, perforation, tamponade, vascular damage, renal failure, MI, stroke, death, inappropriate shocks, damage to his existing leads, and lead dislodgement and wishes to proceed.  I have reviewed EKG which reveals chronic conduction system disease which predates his current device implant.  It appears that due to preserved EF and NYHA Class I symptoms that Dr [name] elected to not place an LV lead previously.  I had a long discussion with the patient about this today.  He is clear that he would prefer to not have additional leads placed at this time.  He would prefer generator change covered under St Jude warranty.  We will therefore proceed accordingly.  Thompson Grayer MD, [facility] 05/31/2016 4:25 PM

## 2016-06-01 ENCOUNTER — Encounter (HOSPITAL_COMMUNITY): Payer: Self-pay | Admitting: Internal Medicine

## 2016-06-05 LAB — AEROBIC/ANAEROBIC CULTURE (SURGICAL/DEEP WOUND): CULTURE: NO GROWTH

## 2016-06-05 LAB — AEROBIC/ANAEROBIC CULTURE W GRAM STAIN (SURGICAL/DEEP WOUND)

## 2016-06-05 NOTE — Progress Notes (Signed)
Pt seen with ICD failure   Referred to hospitatl  Dr  Greggory Brandy admitted and cared for the patient

## 2016-06-06 MED FILL — Cefazolin Sodium-Dextrose IV Solution 2 GM/100ML-4%: INTRAVENOUS | Qty: 100 | Status: AC

## 2016-06-06 NOTE — Addendum Note (Signed)
Addended by: Campbell Riches on: 06/06/2016 02:55 PM   Modules accepted: Orders

## 2016-06-14 ENCOUNTER — Ambulatory Visit (INDEPENDENT_AMBULATORY_CARE_PROVIDER_SITE_OTHER): Payer: Medicare Other | Admitting: *Deleted

## 2016-06-14 DIAGNOSIS — I472 Ventricular tachycardia, unspecified: Secondary | ICD-10-CM

## 2016-06-14 DIAGNOSIS — Z95810 Presence of automatic (implantable) cardiac defibrillator: Secondary | ICD-10-CM

## 2016-06-14 LAB — CUP PACEART REMOTE DEVICE CHECK
Battery Remaining Longevity: 83 mo
Battery Voltage: 3.13 V
Brady Statistic AP VP Percent: 1.6 %
Date Time Interrogation Session: 20171211184551
HIGH POWER IMPEDANCE MEASURED VALUE: 64 Ohm
HighPow Impedance: 64 Ohm
Implantable Pulse Generator Implant Date: 20171221
Lead Channel Pacing Threshold Pulse Width: 0.5 ms
Lead Channel Sensing Intrinsic Amplitude: 2.8 mV
Lead Channel Setting Pacing Amplitude: 2 V
Lead Channel Setting Pacing Pulse Width: 0.5 ms
MDC IDC MSMT BATTERY REMAINING PERCENTAGE: 90 %
MDC IDC MSMT LEADCHNL RA IMPEDANCE VALUE: 490 Ohm
MDC IDC MSMT LEADCHNL RA PACING THRESHOLD AMPLITUDE: 0.75 V
MDC IDC MSMT LEADCHNL RA SENSING INTR AMPL: 2.4 mV
MDC IDC MSMT LEADCHNL RV IMPEDANCE VALUE: 440 Ohm
MDC IDC MSMT LEADCHNL RV PACING THRESHOLD AMPLITUDE: 0.75 V
MDC IDC MSMT LEADCHNL RV PACING THRESHOLD PULSEWIDTH: 0.5 ms
MDC IDC SET LEADCHNL RV PACING AMPLITUDE: 2.5 V
MDC IDC SET LEADCHNL RV SENSING SENSITIVITY: 0.5 mV
MDC IDC STAT BRADY AP VS PERCENT: 96 %
MDC IDC STAT BRADY AS VP PERCENT: 1 %
MDC IDC STAT BRADY AS VS PERCENT: 1 %
MDC IDC STAT BRADY RA PERCENT PACED: 93 %
MDC IDC STAT BRADY RV PERCENT PACED: 1.7 %
Pulse Gen Serial Number: 7390376

## 2016-06-14 LAB — CUP PACEART INCLINIC DEVICE CHECK
Brady Statistic RA Percent Paced: 95 %
HighPow Impedance: 55.125
Implantable Lead Implant Date: 20170601
Implantable Lead Implant Date: 20170601
Implantable Lead Location: 753859
Implantable Lead Location: 753860
Implantable Lead Model: 7122
Implantable Pulse Generator Implant Date: 20171221
Lead Channel Impedance Value: 512.5 Ohm
Lead Channel Pacing Threshold Pulse Width: 0.5 ms
Lead Channel Sensing Intrinsic Amplitude: 2.5 mV
Lead Channel Sensing Intrinsic Amplitude: 2.6 mV
Lead Channel Setting Pacing Amplitude: 2 V
Lead Channel Setting Pacing Amplitude: 2.5 V
Lead Channel Setting Pacing Pulse Width: 0.5 ms
MDC IDC MSMT LEADCHNL RA PACING THRESHOLD AMPLITUDE: 0.5 V
MDC IDC MSMT LEADCHNL RA PACING THRESHOLD PULSEWIDTH: 0.5 ms
MDC IDC MSMT LEADCHNL RV IMPEDANCE VALUE: 425 Ohm
MDC IDC MSMT LEADCHNL RV PACING THRESHOLD AMPLITUDE: 0.75 V
MDC IDC PG SERIAL: 7390376
MDC IDC SESS DTM: 20180104095704
MDC IDC SET LEADCHNL RV SENSING SENSITIVITY: 0.5 mV
MDC IDC STAT BRADY RV PERCENT PACED: 9.6 %

## 2016-06-14 NOTE — Patient Instructions (Signed)
Your physician wants you to follow-up in: 3 months with Dr. [name]. You will receive a reminder letter in the mail two months in advance. If you don't receive a letter, please call our office to schedule the follow-up appointment.

## 2016-06-14 NOTE — Progress Notes (Signed)
Wound check appointment. Steri-strips removed. Wound without redness or edema. Incision edges approximated, wound well healed. Normal device function. Thresholds, sensing, and impedances consistent with implant measurements (R-waves stable at 2.5-3.1m). Device programmed at chronic outputs. Histogram distribution appropriate for patient and level of activity. No mode switches or ventricular arrhythmias noted. Patient educated about wound care, arm mobility, lifting restrictions, shock plan. ROV with GT in 3 months.

## 2016-07-30 ENCOUNTER — Other Ambulatory Visit: Payer: Self-pay | Admitting: Cardiology

## 2016-08-15 ENCOUNTER — Telehealth: Payer: Self-pay | Admitting: *Deleted

## 2016-08-15 ENCOUNTER — Ambulatory Visit (INDEPENDENT_AMBULATORY_CARE_PROVIDER_SITE_OTHER): Payer: Medicare Other | Admitting: Internal Medicine

## 2016-08-15 ENCOUNTER — Encounter: Payer: Self-pay | Admitting: Internal Medicine

## 2016-08-15 VITALS — BP 114/64 | HR 68 | Ht 63.25 in | Wt 150.2 lb

## 2016-08-15 DIAGNOSIS — Z7902 Long term (current) use of antithrombotics/antiplatelets: Secondary | ICD-10-CM | POA: Diagnosis not present

## 2016-08-15 DIAGNOSIS — Z86010 Personal history of colonic polyps: Secondary | ICD-10-CM | POA: Diagnosis not present

## 2016-08-15 MED ORDER — NA SULFATE-K SULFATE-MG SULF 17.5-3.13-1.6 GM/177ML PO SOLN: ORAL | 0 refills | Status: DC

## 2016-08-15 NOTE — Telephone Encounter (Signed)
   JOSIA CUEVA 09-Sep-1934 [id number]  Dear Dr [name]:  We have scheduled the above named patient for a(n) colonoscopy procedure. Our records show that (s)he is on anticoagulation therapy.  Please advise as to whether the patient may come of their therapy of Pletal 5 days days prior to their procedure which is scheduled for 09/26/16.  Please route your response to Dixon Boos, CMA.  Sincerely,  Dr Zenovia Jarred

## 2016-08-15 NOTE — Patient Instructions (Signed)
You have been scheduled for a colonoscopy. Please follow written instructions given to you at your visit today.  Please pick up your prep supplies at the pharmacy within the next 1-3 days. If you use inhalers (even only as needed), please bring them with you on the day of your procedure. Your physician has requested that you go to www.startemmi.com and enter the access code given to you at your visit today. This web site gives a general overview about your procedure. However, you should still follow specific instructions given to you by our office regarding your preparation for the procedure.  If you are age 80 or older, your body mass index should be between 23-30. Your Body mass index is 26.41 kg/m. If this is out of the aforementioned range listed, please consider follow up with your Primary Care Provider.  If you are age 65 or younger, your body mass index should be between 19-25. Your Body mass index is 26.41 kg/m. If this is out of the aformentioned range listed, please consider follow up with your Primary Care Provider.

## 2016-08-15 NOTE — Telephone Encounter (Signed)
He is only on aspirin and Pletal. It would be okay for now hold Pletal 5-7 days preprocedure.   Glenetta Hew, MD

## 2016-08-15 NOTE — Telephone Encounter (Signed)
   Ralph Drake 1935/02/14 [id number]  Dear Dr [name]:  We have scheduled the above named patient for a(n) colonoscopy procedure. Our records show that (s)he is on anticoagulation therapy.  Please advise as to whether the patient may come of their therapy of Plavix 5 days days prior to their procedure which is scheduled for 09/26/16.  Please route your response to Dixon Boos, CMA.  Sincerely,  [facility], Dr Hilarie Fredrickson

## 2016-08-15 NOTE — Progress Notes (Signed)
Subjective:    Patient ID: Ralph Drake, male    DOB: 04/27/1935, 82 y.o.   MRN: [id number]  HPI Ralph Drake is a 82 year old male with a past history of adenomatous colon polyps who is here for follow-up. I saw him in May 2017 to discuss surveillance colonoscopy at which point we decided to proceed. After that visit with me he developed V. tach and has subsequently had an ICD placed and he has been started on amiodarone. He had his ICD replaced in December 2017. Since starting amiodarone he has been doing very well. He follows with Dr. [name] and Dr. [name] with cardiology. He's been on Plavix since ICD was placed in his been on Pletal for a long time.    He reports he has been feeling well. He's had no change in his bowel habits. No rectal bleeding or blood in his stool. No melena. No abdominal pain. Good appetite. No trouble swallowing. No issues with heartburn, nausea or vomiting.  His last colonoscopy was in 2011 when he had 2 tubular adenomas removed. Prior records indicating family history of colon cancer were incorrect and he denies this again today.  Review of Systems As per history of present illness, otherwise negative  Current Medications, Allergies, Past Medical History, Past Surgical History, Family History and Social History were reviewed in [employer] record.     Objective:   Physical Exam BP 114/64 (BP Location: Left Arm, Patient Position: Sitting, Cuff Size: Normal)   Pulse 68   Ht 5' 3.25" (1.607 m) Comment: height measured without shoes  Wt 150 lb 4 oz (68.2 kg)   BMI 26.41 kg/m  Constitutional: Well-developed and well-nourished. No distress. HEENT: Normocephalic and atraumatic.  Conjunctivae are normal.  No scleral icterus. Neck: Neck supple. Trachea midline. Cardiovascular: Normal rate, regular rhythm and intact distal pulses. No M/R/G Pulmonary/chest: Effort normal and breath sounds normal. No wheezing, rales or rhonchi. Abdominal:  Soft, nontender, nondistended. Bowel sounds active throughout.  Extremities: no clubbing, cyanosis, or edema Neurological: Alert and oriented to person place and time. Skin: Skin is warm and dry. No rashes noted. Psychiatric: Normal mood and affect. Behavior is normal.  CBC    Component Value Date/Time   WBC 8.0 05/31/2016 1325   RBC 4.30 05/31/2016 1325   HGB 13.2 05/31/2016 1325   HCT 39.0 05/31/2016 1325   PLT 198 05/31/2016 1325   MCV 90.7 05/31/2016 1325   MCH 30.7 05/31/2016 1325   MCHC 33.8 05/31/2016 1325   RDW 15.5 05/31/2016 1325   LYMPHSABS 1.4 11/08/2015 0011   MONOABS 0.6 11/08/2015 0011   EOSABS 0.1 11/08/2015 0011   BASOSABS 0.0 11/08/2015 0011   CMP     Component Value Date/Time   NA 134 (L) 05/31/2016 1325   K 4.5 05/31/2016 1325   CL 105 05/31/2016 1325   CO2 22 05/31/2016 1325   GLUCOSE 97 05/31/2016 1325   BUN 25 (H) 05/31/2016 1325   CREATININE 1.86 (H) 05/31/2016 1325   CREATININE 1.55 (H) 11/23/2015 1122   CALCIUM 10.1 05/31/2016 1325   PROT 6.8 12/14/2015 1059   ALBUMIN 4.6 12/14/2015 1059   AST 19 12/14/2015 1059   ALT 19 12/14/2015 1059   ALKPHOS 61 12/14/2015 1059   BILITOT 0.5 12/14/2015 1059   GFRNONAA 33 (L) 05/31/2016 1325   GFRAA 38 (L) 05/31/2016 1325       Assessment & Plan:  82 year old male with a past history of adenomatous colon polyps who is  here for follow-up.  1. History of adenomatous colon polyps -- he wishes to proceed to surveillance colonoscopy which I think is reasonable. We now know much more about his cardiovascular health that we did last year. We will contact his cardiologist for clearance to stop his antiplatelet therapy. Again discussed the risks, benefits and alternative to colonoscopy and he wishes to proceed  Hold Plavix and Pletal 5 days before procedure - will instruct when and how to resume after procedure. Risks and benefits of procedure including bleeding, perforation, infection, missed lesions, medication  reactions and possible hospitalization or surgery if complications occur explained. Additional rare but real risk of cardiovascular event such as heart attack or ischemia/infarct of other organs off Plavix and Pletal explained and need to seek urgent help if this occurs. Will communicate by phone or EMR with patient's prescribing provider that to confirm holding Plavix and Pletal is reasonable in this case.

## 2016-08-16 NOTE — Telephone Encounter (Signed)
I have advised patient that per Dr [name], he may hold Pletal 5 days prior to procedure. Patient verbalizes understanding. He also states that Dr [name] has him on Plavix since placing his ICD. I advised that we have sent a not to Dr [name] regarding this and are awaiting his response for clearance of holding that medication. He verbalizes understanding of this as well.

## 2016-08-17 NOTE — Telephone Encounter (Signed)
Ok to stop anti-coagulation (plavix) for 5 days. GT

## 2016-08-17 NOTE — Telephone Encounter (Signed)
Patient advised that Dr [name] has okayed him to hold his Plavix 5 days prior to test. He verbalizes understanding of this. He will hold Pletal 5 days as well as previously okayed by Dr [name].

## 2016-09-19 ENCOUNTER — Encounter (INDEPENDENT_AMBULATORY_CARE_PROVIDER_SITE_OTHER): Payer: Self-pay

## 2016-09-19 ENCOUNTER — Encounter: Payer: Self-pay | Admitting: Internal Medicine

## 2016-09-19 ENCOUNTER — Ambulatory Visit (INDEPENDENT_AMBULATORY_CARE_PROVIDER_SITE_OTHER): Payer: Medicare Other | Admitting: Internal Medicine

## 2016-09-19 VITALS — BP 154/74 | HR 80 | Ht 64.0 in | Wt 151.0 lb

## 2016-09-19 DIAGNOSIS — I428 Other cardiomyopathies: Secondary | ICD-10-CM

## 2016-09-19 DIAGNOSIS — I480 Paroxysmal atrial fibrillation: Secondary | ICD-10-CM

## 2016-09-19 DIAGNOSIS — Z95810 Presence of automatic (implantable) cardiac defibrillator: Secondary | ICD-10-CM

## 2016-09-19 LAB — CUP PACEART INCLINIC DEVICE CHECK
Brady Statistic RA Percent Paced: 98 %
Brady Statistic RV Percent Paced: 7.8 %
Date Time Interrogation Session: 20180411134751
HighPow Impedance: 59.625
Implantable Lead Implant Date: 20170601
Implantable Lead Implant Date: 20170601
Implantable Lead Location: 753859
Implantable Lead Location: 753860
Implantable Lead Model: 7122
Implantable Pulse Generator Implant Date: 20171221
Lead Channel Impedance Value: 425 Ohm
Lead Channel Impedance Value: 525 Ohm
Lead Channel Pacing Threshold Amplitude: 0.5 V
Lead Channel Pacing Threshold Amplitude: 0.5 V
Lead Channel Pacing Threshold Amplitude: 1 V
Lead Channel Pacing Threshold Amplitude: 1 V
Lead Channel Pacing Threshold Pulse Width: 0.5 ms
Lead Channel Pacing Threshold Pulse Width: 0.5 ms
Lead Channel Pacing Threshold Pulse Width: 0.5 ms
Lead Channel Pacing Threshold Pulse Width: 0.5 ms
Lead Channel Sensing Intrinsic Amplitude: 2.3 mV
Lead Channel Sensing Intrinsic Amplitude: 2.8 mV
Lead Channel Setting Pacing Amplitude: 2 V
Lead Channel Setting Pacing Amplitude: 2.5 V
Lead Channel Setting Pacing Pulse Width: 0.5 ms
Lead Channel Setting Sensing Sensitivity: 0.5 mV
Pulse Gen Serial Number: 7390376

## 2016-09-19 NOTE — Progress Notes (Signed)
HPI Mr. Zechman returns today for followup. He is a pleasant 82 yo man with VT, ICM,chronic systolic heart failure, s/p ICD implant. In the interim he has been stable. He denies chest pain or sob. No syncope. He is tolerating the amiodarone and had no additional arrhythmias.   No Known Allergies   Current Outpatient Prescriptions  Medication Sig Dispense Refill  . amiodarone (PACERONE) 200 MG tablet Take 1 tablet (200 mg total) by mouth daily. 180 tablet 3  . amLODipine (NORVASC) 10 MG tablet Take 1 tablet (10 mg total) by mouth daily. 90 tablet 3  . aspirin 81 MG EC tablet Take 1 tablet (81 mg total) by mouth daily.    [name] atorvastatin (LIPITOR) 80 MG tablet Take 1 tablet (80 mg total) by mouth daily. 90 tablet 3  . cilostazol (PLETAL) 100 MG tablet Take 1 tablet (100 mg total) by mouth 2 (two) times daily. 180 tablet 3  . clopidogrel (PLAVIX) 75 MG tablet TAKE 1 TABLET BY MOUTH  DAILY 90 tablet 3  . ezetimibe (ZETIA) 10 MG tablet Take 1 tablet (10 mg total) by mouth daily. 90 tablet 0  . fish oil-omega-3 fatty acids 1000 MG capsule Take 1 g by mouth 2 (two) times daily.     [name] lisinopril (PRINIVIL) 10 MG tablet Take 1 tablet (10 mg total) by mouth daily. 30 tablet 3  . metoprolol tartrate (LOPRESSOR) 25 MG tablet TAKE ONE-HALF TABLET BY  MOUTH TWO TIMES DAILY 90 tablet 2  . Multiple Vitamins-Minerals (MULTIVITAMIN WITH MINERALS) tablet Take 1 tablet by mouth daily.    . Na Sulfate-K Sulfate-Mg Sulf 17.5-3.13-1.6 GM/180ML SOLN Suprep-Use as directed 354 mL 0  . vitamin E 400 UNIT capsule Take 2 capsules by mouth daily.     No current facility-administered medications for this visit.      Past Medical History:  Diagnosis Date  . Atrial fibrillation ([place])   . Bradycardia   . CAD S/P percutaneous coronary angioplasty 1993   POBA to Cx x 2 occasions; mild to moderate/nonobstructive CAD on preop cath August 2014  . Dyslipidemia, goal LDL below 70   . Former heavy tobacco smoker     quit in October 2014  . GERD (gastroesophageal reflux disease)   . Hypertension   . Olecranon bursitis of left elbow   . Osteoarthritis   . Peripheral vascular disease ([place])     H/O R SFA stent, L Iliac Stent 1996;;  Lower Extremity Angiography April 2015: Occluded left SFA reconstituting at end of the canal, heavily calcified. Three-vessel runoff..;; RCA 50% ostial right common iliac, patent SFA stent -> Referred to Dr. Trula Slade of [facility].  . S/P AVR (aortic valve replacement) 02/10/2013   23 [address] Magna Ease Pericardial Tissue Valve  . Severe aortic stenosis by prior echocardiogram    per Dr. Rollene Fare 2012; referred for aortic valve replacement  . Shortness of breath    Chronic  . Trigger ring finger of left hand   . Tubular adenoma of colon   . VT (ventricular tachycardia) ([place])    hemodynamically significant VT on 11/07/2015, nonobstructive CAD, no culprit lesion, underwent St Jude Dual Chamber ICD by Dr. [name]    ROS:   All systems reviewed and negative except as noted in the HPI.   Past Surgical History:  Procedure Laterality Date  . ABDOMINAL ANGIOGRAM  09/10/2013   Procedure: ABDOMINAL ANGIOGRAM;  Surgeon: [name], MD;  Location: [facility] CATH LAB;  Service:  Cardiovascular;;  . AORTIC VALVE REPLACEMENT  02/10/2013   Dr. Servando Snare: [id number] Ease Pericardia  . AORTIC VALVE REPLACEMENT N/A 02/10/2013   AORTIC VALVE REPLACEMENT (AVR);  Surgeon: Grace Isaac, MD;  Location: [place];  Service: Open Heart Surgery;  Laterality: N/A  . ARTERIAL DOPPLER - PRE-CABG  02/06/2013   Bilateral 1-39% ICA stenosis  . CARDIAC CATHETERIZATION  August 2014   Nonobstructive 30-50% RCA. No significant LAD disease. 30% mid circumflex.  [name] CARDIAC CATHETERIZATION  11/10/2009   No intervention - recommend medical management  . CARDIAC CATHETERIZATION N/A 11/09/2015   Procedure: Left Heart Cath and Coronary Angiography;  Surgeon: Belva Crome, MD;  Location: [place] CV LAB;  Service:  Cardiovascular;  Laterality: N/A;  . CARDIOVASCULAR STRESS TEST  06/24/2012   Diffuse LV hypokinesis with moderately depressed systolic function and EF 40%,  . CORONARY ANGIOPLASTY  1993   Circumflex x 2   . EP IMPLANTABLE DEVICE N/A 11/10/2015   Procedure: ICD Implant;  Surgeon: Evans Lance, MD;  Location: [place] CV LAB;  Service: Cardiovascular;  Laterality: N/A;  . EP IMPLANTABLE DEVICE N/A 05/31/2016   Procedure: ICD Generator Changeout;  Surgeon: Thompson Grayer, MD;  Location: [place] CV LAB;  Service: Cardiovascular;  Laterality: N/A;  . ILIAC ARTERY STENT Left 1996  . INTRAOPERATIVE TRANSESOPHAGEAL ECHOCARDIOGRAM N/A 02/10/2013   Procedure: INTRAOPERATIVE TRANSESOPHAGEAL ECHOCARDIOGRAM;  Surgeon: Grace Isaac, MD;  Location: [place];  Service: Open Heart Surgery;  Laterality: N/A;  . LEFT AND RIGHT HEART CATHETERIZATION WITH CORONARY ANGIOGRAM N/A 01/28/2013   Procedure: LEFT AND RIGHT HEART CATHETERIZATION WITH CORONARY ANGIOGRAM;  Surgeon: Leonie Man, MD;  Location: [facility] CATH LAB;  Service: Cardiovascular;  Laterality: N/A;  . LOWER EXTREMITY ANGIOGRAM N/A 09/10/2013   Procedure: LOWER EXTREMITY ANGIOGRAM;  Surgeon: [name], MD;  Location[facility] C[facility] LAB;  Service: Cardiovascular;  Laterality: N/A;  . OLECRANON BURSECTOMY  07/23/2011   Procedure: OLECRANON BURSA;  Surgeon: Lorn Junes, MD;  Location: [place];  Service: Orthopedics;  Laterality: Left;  excision olecranon bursa left elbow patient had supraclaviular block in preop  . PERIPHERAL VASCULAR ANGIOGRAM  07/19/1994   No intervention-recommend medical therapy  . PERIPHERAL VASCULAR ANGIOGRAM  01/17/1995   Right SFA 80-90# lesion, dilatation performed with a Cordis "Optz 5" 72m x 2cm, inflated a 4-7.5atm for 40 to 70 seconds. Completion angiogram done by hand with "bolus chase" runoff from Primal SFA to foot. [place] in reduction of 80-90% to 20% with excellent flow and without dissection  .  PERIPHERAL VASCULAR ANGIOGRAM  09/10/2013   Occluded left SFA, heavily calcified with reconstitution at adductor canal. Three-vessel runof.;; Right lower extremity noted 50% ostial right common iliac stenosis with roughly 20 mm gradient. Not considered significant.f  . right hand surgery    . ROTATOR CUFF REPAIR Right   . Superficial Femoral Atery stent Right 1996  . TONSILLECTOMY     as child  . TRANSTHORACIC ECHOCARDIOGRAM  03/23/2013   Post-Op Echo #1: EF 50-55%, Mild Conc LVH, Mild HK of Inferolateral wall with paradoxical septal motion (c/w post-op state); Well seated 23 mm Bioprosthetic Valve (area ~1.23 cm2 - normal for valve); Mild MR  . TRANSTHORACIC ECHOCARDIOGRAM  March 2017   EF 50-55%. No RWMA.  GR 1 DD.  23 mm bioprosthetic valve functioning normally.  Mild biatrial dilation.  .Marland KitchenTRIGGER FINGER RELEASE  07/23/2011   Procedure: RELEASE TRIGGER FINGER/A-1 PULLEY;  Surgeon: RHerbie Baltimore  Venetia Maxon, MD;  Location: [place];  Service: Orthopedics;  Laterality: Left;  release trigger left ring finger patient had supraclavicular block in preop     Family History  Problem Relation Age of Onset  . Hypertension Mother   . Cancer Mother     Oral  . Stroke Father      Social History   Social History  . Marital status: Married    Spouse name: N/A  . Number of children: N/A  . Years of education: N/A   Occupational History  . Not on file.   Social History Main Topics  . Smoking status: Former Smoker    Packs/day: 0.50    Years: 60.00    Types: Cigarettes    Quit date: 01/31/2013  . Smokeless tobacco: Never Used  . Alcohol use No  . Drug use: No  . Sexual activity: Not on file   Other Topics Concern  . Not on file   Social History Narrative   Married with no children. He long-term smoker who quit prior to his aVR in 2014.    Up until his operation he worked part-time at [employer].  He is not yet gone back to work postoperatively.   He chose not be  cardiac rehabilitation, but has gone back to walking exercising daily.     BP (!) 154/74   Pulse 80   Ht '5\' 4"'$  (1.626 m)   Wt 151 lb (68.5 kg)   SpO2 98%   BMI 25.92 kg/m   Physical Exam:  Well appearing 82 yo man, NAD HEENT: Unremarkable Neck:  6 cm JVD, no thyromegally Lymphatics:  No adenopathy Back:  No CVA tenderness Lungs:  Clear with no wheezes HEART:  Regular rate rhythm, no murmurs, no rubs, no clicks Abd:  soft, positive bowel sounds, no organomegally, no rebound, no guarding Ext:  2 plus pulses, no edema, no cyanosis, no clubbing Skin:  No rashes no nodules Neuro:  CN II through XII intact, motor grossly intact  EKG - NSR with RBBB with atrial pacing  DEVICE  Normal device function.  See PaceArt for details.   Assess/Plan: 1. VT - he has had no recurrent VT. He will continue his amiodarone. I would anticipate reducing the dose when we see him again. 2. Chronic systolic heart failure - his symptoms remain class 2. Will follow. 3. HTN - his blood pressure is controlled. Will follow. 4. ICD - His device is working normally.  Mikle Bosworth.D.

## 2016-09-19 NOTE — Patient Instructions (Signed)
Your physician recommends that you continue on your current medications as directed. Please refer to the Current Medication list given to you today.   Your physician wants you to follow-up in: [place] will receive a reminder letter in the mail two months in advance. If you don't receive a letter, please call our office to schedule the follow-up appointment.

## 2016-09-26 ENCOUNTER — Ambulatory Visit (AMBULATORY_SURGERY_CENTER): Payer: Medicare Other | Admitting: Internal Medicine

## 2016-09-26 ENCOUNTER — Encounter: Payer: Self-pay | Admitting: Internal Medicine

## 2016-09-26 VITALS — BP 121/70 | HR 60 | Temp 97.8°F | Resp 14 | Ht 63.0 in | Wt 150.0 lb

## 2016-09-26 DIAGNOSIS — D120 Benign neoplasm of cecum: Secondary | ICD-10-CM | POA: Diagnosis not present

## 2016-09-26 DIAGNOSIS — Z86010 Personal history of colonic polyps: Secondary | ICD-10-CM

## 2016-09-26 DIAGNOSIS — D123 Benign neoplasm of transverse colon: Secondary | ICD-10-CM | POA: Diagnosis not present

## 2016-09-26 DIAGNOSIS — D125 Benign neoplasm of sigmoid colon: Secondary | ICD-10-CM

## 2016-09-26 DIAGNOSIS — D122 Benign neoplasm of ascending colon: Secondary | ICD-10-CM | POA: Diagnosis not present

## 2016-09-26 MED ORDER — SODIUM CHLORIDE 0.9 % IV SOLN: 500.0000 mL | INTRAVENOUS | Status: DC

## 2016-09-26 NOTE — Progress Notes (Signed)
To recovery, report to Oliver, RN, VSS 

## 2016-09-26 NOTE — Progress Notes (Signed)
Called to room to assist during endoscopic procedure.  Patient ID and intended procedure confirmed with present staff. Received instructions for my participation in the procedure from the performing physician.  

## 2016-09-26 NOTE — Patient Instructions (Signed)
Handouts given on polyps and diverticulosis  YOU HAD AN ENDOSCOPIC PROCEDURE TODAY: Refer to the procedure report and other information in the discharge instructions given to you for any specific questions about what was found during the examination. If this information does not answer your questions, please call [place] office at [phone] to clarify.   YOU SHOULD EXPECT: Some feelings of bloating in the abdomen. Passage of more gas than usual. Walking can help get rid of the air that was put into your GI tract during the procedure and reduce the bloating. If you had a lower endoscopy (such as a colonoscopy or flexible sigmoidoscopy) you may notice spotting of blood in your stool or on the toilet paper. Some abdominal soreness may be present for a day or two, also.  DIET: Your first meal following the procedure should be a light meal and then it is ok to progress to your normal diet. A half-sandwich or bowl of soup is an example of a good first meal. Heavy or fried foods are harder to digest and may make you feel nauseous or bloated. Drink plenty of fluids but you should avoid alcoholic beverages for 24 hours. If you had a esophageal dilation, please see attached instructions for diet.    ACTIVITY: Your care partner should take you home directly after the procedure. You should plan to take it easy, moving slowly for the rest of the day. You can resume normal activity the day after the procedure however YOU SHOULD NOT DRIVE, use power tools, machinery or perform tasks that involve climbing or major physical exertion for 24 hours (because of the sedation medicines used during the test).   SYMPTOMS TO REPORT IMMEDIATELY: A gastroenterologist can be reached at any hour. Please call [phone]  for any of the following symptoms:  Following lower endoscopy (colonoscopy, flexible sigmoidoscopy) Excessive amounts of blood in the stool  Significant tenderness, worsening of abdominal pains  Swelling of  the abdomen that is new, acute  Fever of 100 or higher    FOLLOW UP:  If any biopsies were taken you will be contacted by phone or by letter within the next 1-3 weeks. Call [phone]  if you have not heard about the biopsies in 3 weeks.  Please also call with any specific questions about appointments or follow up tests.  

## 2016-09-26 NOTE — Op Note (Signed)
[place] Patient Name: Ralph Drake Procedure Date: 09/26/2016 2:08 PM MRN: [id number] Endoscopist: Jerene Bears , MD Age: 81 Referring MD:  Date of Birth: 24-Jun-1934 Gender: Male Account #: [id number] Procedure:                Colonoscopy Indications:              Surveillance: Personal history of adenomatous                            polyps on last colonoscopy > 5 years ago Medicines:                Monitored Anesthesia Care Procedure:                Pre-Anesthesia Assessment:                           - Prior to the procedure, a History and Physical                            was performed, and patient medications and                            allergies were reviewed. The patient's tolerance of                            previous anesthesia was also reviewed. The risks                            and benefits of the procedure and the sedation                            options and risks were discussed with the patient.                            All questions were answered, and informed consent                            was obtained. Prior Anticoagulants: The patient has                            taken Plavix (clopidogrel), last dose was 5 days                            prior to procedure. ASA Grade Assessment: III - A                            patient with severe systemic disease. After                            reviewing the risks and benefits, the patient was                            deemed in satisfactory condition to undergo the  procedure.                           After obtaining informed consent, the colonoscope                            was passed under direct vision. Throughout the                            procedure, the patient's blood pressure, pulse, and                            oxygen saturations were monitored continuously. The                            Colonoscope was introduced through the anus and                 advanced to the the cecum, identified by                            appendiceal orifice and ileocecal valve. The                            colonoscopy was performed without difficulty. The                            patient tolerated the procedure well. The quality                            of the bowel preparation was good. The ileocecal                            valve, appendiceal orifice, and rectum were                            photographed. Scope In: 2:20:06 PM Scope Out: 2:45:02 PM Scope Withdrawal Time: 0 hours 22 minutes 24 seconds  Total Procedure Duration: 0 hours 24 minutes 56 seconds  Findings:                 The digital rectal exam was normal.                           A 5 mm polyp was found in the ileocecal valve. The                            polyp was sessile. The polyp was removed with a                            cold snare. Resection and retrieval were complete.                           Five sessile polyps were found in the ascending  colon. The polyps were 4 to 8 mm in size. These                            polyps were removed with a cold snare. Resection                            and retrieval were complete.                           Two sessile polyps were found in the transverse                            colon. The polyps were 4 to 5 mm in size. These                            polyps were removed with a cold snare. Resection                            and retrieval were complete.                           A 4 mm polyp was found in the sigmoid colon. The                            polyp was sessile. The polyp was removed with a                            cold snare. Resection and retrieval were complete.                           A few small-mouthed diverticula were found in the                            sigmoid colon.                           The retroflexed view of the distal rectum and anal                             verge was normal and showed no anal or rectal                            abnormalities. Complications:            No immediate complications. Estimated Blood Loss:     Estimated blood loss was minimal. Impression:               - One 5 mm polyp at the ileocecal valve, removed                            with a cold snare. Resected and retrieved.                           - Five 4  to 8 mm polyps in the ascending colon,                            removed with a cold snare. Resected and retrieved.                           - Two 4 to 5 mm polyps in the transverse colon,                            removed with a cold snare. Resected and retrieved.                           - One 4 mm polyp in the sigmoid colon, removed with                            a cold snare. Resected and retrieved.                           - Diverticulosis in the sigmoid colon.                           - The distal rectum and anal verge are normal on                            retroflexion view. Recommendation:           - Patient has a contact number available for                            emergencies. The signs and symptoms of potential                            delayed complications were discussed with the                            patient. Return to normal activities tomorrow.                            Written discharge instructions were provided to the                            patient.                           - Resume previous diet.                           - Continue present medications.                           - Resume Plavix (clopidogrel) at prior dose                            tomorrow.                           -  Await pathology results.                           - No repeat colonoscopy due to age. Jerene Bears, MD 09/26/2016 2:49:13 PM This report has been signed electronically.

## 2016-09-27 ENCOUNTER — Telehealth: Payer: Self-pay | Admitting: *Deleted

## 2016-09-27 ENCOUNTER — Telehealth: Payer: Self-pay

## 2016-09-27 NOTE — Telephone Encounter (Signed)
  Follow up Call-  Call back number 09/26/2016  Post procedure Call Back phone  # [phone]  Permission to leave phone message Yes  Some recent data might be hidden     Patient questions:  Do you have a fever, pain , or abdominal swelling? No. Pain Score  0 *  Have you tolerated food without any problems? Yes.    Have you been able to return to your normal activities? Yes.    Do you have any questions about your discharge instructions: Diet   No. Medications  No. Follow up visit  No.  Do you have questions or concerns about your Care? No.  Actions: * If pain score is 4 or above: No action needed, pain <4.  Spoke with patients wife.  Pt is doing fine.

## 2016-09-27 NOTE — Telephone Encounter (Signed)
  Follow up Call-  Call back number 09/26/2016  Post procedure Call Back phone  # [phone]  Permission to leave phone message Yes  Some recent data might be hidden     Left message

## 2016-10-02 ENCOUNTER — Encounter: Payer: Self-pay | Admitting: Internal Medicine

## 2016-10-04 ENCOUNTER — Ambulatory Visit (INDEPENDENT_AMBULATORY_CARE_PROVIDER_SITE_OTHER): Payer: Medicare Other | Admitting: Cardiology

## 2016-10-04 ENCOUNTER — Encounter: Payer: Self-pay | Admitting: Cardiology

## 2016-10-04 VITALS — BP 141/73 | HR 80 | Ht 63.5 in | Wt 150.4 lb

## 2016-10-04 DIAGNOSIS — I739 Peripheral vascular disease, unspecified: Secondary | ICD-10-CM

## 2016-10-04 DIAGNOSIS — Z952 Presence of prosthetic heart valve: Secondary | ICD-10-CM | POA: Diagnosis not present

## 2016-10-04 DIAGNOSIS — I480 Paroxysmal atrial fibrillation: Secondary | ICD-10-CM | POA: Diagnosis not present

## 2016-10-04 DIAGNOSIS — I472 Ventricular tachycardia, unspecified: Secondary | ICD-10-CM

## 2016-10-04 DIAGNOSIS — E785 Hyperlipidemia, unspecified: Secondary | ICD-10-CM

## 2016-10-04 DIAGNOSIS — I2510 Atherosclerotic heart disease of native coronary artery without angina pectoris: Secondary | ICD-10-CM

## 2016-10-04 DIAGNOSIS — R5383 Other fatigue: Secondary | ICD-10-CM | POA: Diagnosis not present

## 2016-10-04 DIAGNOSIS — Z9861 Coronary angioplasty status: Secondary | ICD-10-CM | POA: Diagnosis not present

## 2016-10-04 DIAGNOSIS — I10 Essential (primary) hypertension: Secondary | ICD-10-CM | POA: Diagnosis not present

## 2016-10-04 MED ORDER — EZETIMIBE 10 MG PO TABS: 10.0000 mg | ORAL_TABLET | Freq: Every day | ORAL | 3 refills | Status: DC

## 2016-10-04 NOTE — Progress Notes (Signed)
PCP: [name], MD EP: [name] (for ICD)  Clinic Note: Chief Complaint  Patient presents with  . Follow-up    12 months; pt sdtates no Sx.   . Coronary Artery Disease  . Cardiac Valve Problem    Status post aVR    HPI: Ralph Drake is a 82 y.o. male with a PMH below who presents today for Annual cardiology follow-up for CAD, PAD (SFA stent 1996, SFA occlusion 2015) , history of aortic valve replacement (23 Edwards Magna Ease Pericardial Tissue Valve 2014) with Cardiomyopathy (mixed Valvular & Ischemic). He has known solitary kidney - CKD III along with HTN, HLD. CAD (coronary angioplasty x 2 1993), PAD (SFA stent 1996, SFA occlusion 2015) AVR 2014 (bovine). I actually last saw him in April 2017 - he has had an eventful year - admitted for  VT & status post ICD lat May 2017.[name]  Recent Hospitalizations:   May-June 2017: Sustained VT - ICD  November 29, 2015 - VT storm with multiple shocks -> started on Amiodarone  Dec 2017 for  ICDGen Change  Studies Reviewed:   Cath 11/09/15: Mod (mod-severe CAD): p-mCx 50%. mLAD ~40%, mRCA ~40%. dRCA ~70%, No culprit lesion for VT.  ICD Implant 11/10/2015 (Dr. [name])  - St. Jude ICD L Ax. V.    ICD re-implant for device failure 05/2016.   Ralph Drake was last seen on April 11 by Dr. [name] for his ICD -- He has had ventricular tachycardia status post ICD in the been maintained on amiodarone. He follows up with our electrophysiology team  Interval History: Ralph Drake presents today overall doing fairly well. He still has exertional shortness of breath stating that he will have to take his time and then his breathing comes back. He has no exertional chest tightness or pressure. He does note that he fatigues easily. He says that when he first starts it's okay just takes a while to get going and then he just can't really push himself. He denies any rapid irregular heartbeats or palpitations. Nothing to suggest recurrence of VT. No heart failure  symptoms of PND, orthopnea. No edema.   No lightheadedness, dizziness, weakness or syncope/near syncope. No TIA/amaurosis fugax symptoms. No claudication - unless he really walks farther distance than usual.  ROS: A comprehensive was performed. Review of Systems  Constitutional: Positive for malaise/fatigue.  HENT: Negative.   Respiratory: Positive for shortness of breath (Per history of present illness).   Cardiovascular:       Per history of present illness  Gastrointestinal: Negative for blood in stool and melena.  Genitourinary: Negative for hematuria.  Musculoskeletal: Positive for joint pain.       Stable arthritis pains  Neurological: Negative for dizziness.  Psychiatric/Behavioral: Negative.  Negative for memory loss. The patient is not nervous/anxious and does not have insomnia.   All other systems reviewed and are negative.  I have reviewed and (if needed) updated the patient's problem list, medications, allergies, past medical and surgical history, social and family history.  Past Medical History:  Diagnosis Date  . Atrial fibrillation ([place])   . Bradycardia   . CAD S/P percutaneous coronary angioplasty 1993   POBA to Cx x 2 occasions: 10/29/2015:Mod (mod-severe CAD): p-mCx 50%. mLAD ~40%, mRCA ~40%. dRCA ~70%, No culprit lesion for VT.  [name] Dyslipidemia, goal LDL below 70   . Former heavy tobacco smoker     quit in October 2014  . GERD (gastroesophageal reflux disease)   . Hypertension   .  Olecranon bursitis of left elbow   . Osteoarthritis   . Peripheral vascular disease ([place])     H/O R SFA stent, L Iliac Stent 1996;;  Lower Extremity Angiography April 2015: Occluded left SFA reconstituting at end of the canal, heavily calcified. Three-vessel runoff..;; RCA 50% ostial right common iliac, patent SFA stent -> Referred to Dr. Trula Slade of [facility].  . S/P AVR (aortic valve replacement) 02/10/2013   23 Edwards Magna Ease Pericardial Tissue Valve  . Severe aortic stenosis by  prior echocardiogram    per Dr. Rollene Fare 2012; referred for aortic valve replacement  . Shortness of breath    Chronic  . Trigger ring finger of left hand   . Tubular adenoma of colon   . VT (ventricular tachycardia) ([place])    hemodynamically significant VT on 11/07/2015, nonobstructive CAD, no culprit lesion, underwent St Jude Dual Chamber ICD by Dr. [name]    Past Surgical History:  Procedure Laterality Date  . ABDOMINAL ANGIOGRAM  09/10/2013   Procedure: ABDOMINAL ANGIOGRAM;  Surgeon: [name], MD;  Location: [facility] CATH LAB;  Service: Cardiovascular;;  . AORTIC VALVE REPLACEMENT  02/10/2013   Dr. Servando Snare: [id number] Ease Pericardia  . AORTIC VALVE REPLACEMENT N/A 02/10/2013   AORTIC VALVE REPLACEMENT (AVR);  Surgeon: Grace Isaac, MD;  Location: [place];  Service: Open Heart Surgery;  Laterality: N/A  . ARTERIAL DOPPLER - PRE-CABG  02/06/2013   Bilateral 1-39% ICA stenosis  . CARDIAC CATHETERIZATION  August 2014   Nonobstructive 30-50% RCA. No significant LAD disease. 30% mid circumflex.  [name] CARDIAC CATHETERIZATION  11/10/2009   No intervention - recommend medical management  . CARDIAC CATHETERIZATION N/A 11/09/2015   Procedure: Left Heart Cath and Coronary Angiography;  Surgeon: Belva Crome, MD;  Location: [facility] INVASIVE CV LAB: Mod (mod-severe CAD): p-mCx 50%. mLAD ~40%, mRCA ~40%. dRCA ~70%, No culprit lesion for VT.  [name] CARDIOVASCULAR STRESS TEST  06/24/2012   Diffuse LV hypokinesis with moderately depressed systolic function and EF 78%,  . CORONARY ANGIOPLASTY  1993   Circumflex x 2   . EP IMPLANTABLE DEVICE N/A 11/10/2015   Procedure: ICD Implant;  Surgeon: Evans Lance, MD;  Location: [place] CV LAB;  Service: Cardiovascular;  Laterality: N/A;  . EP IMPLANTABLE DEVICE N/A 05/31/2016   Procedure: ICD Generator Changeout;  Surgeon: Thompson Grayer, MD;  Location: [place] CV LAB;  Service: Cardiovascular;  Laterality: N/A;  . ILIAC ARTERY STENT Left 1996  .  INTRAOPERATIVE TRANSESOPHAGEAL ECHOCARDIOGRAM N/A 02/10/2013   Procedure: INTRAOPERATIVE TRANSESOPHAGEAL ECHOCARDIOGRAM;  Surgeon: Grace Isaac, MD;  Location: [place];  Service: Open Heart Surgery;  Laterality: N/A;  . LEFT AND RIGHT HEART CATHETERIZATION WITH CORONARY ANGIOGRAM N/A 01/28/2013   Procedure: LEFT AND RIGHT HEART CATHETERIZATION WITH CORONARY ANGIOGRAM;  Surgeon: Leonie Man, MD;  Location[facility] CATH LAB;  Service: Cardiovascular;  Laterality: N/A;  . LOWER EXTREMITY ANGIOGRAM N/A 09/10/2013   Procedure: LOWER EXTREMITY ANGIOGRAM;  Surgeon: [name], MD;  Location: [facility] CATH LAB;  Service: Cardiovascular;  Laterality: N/A;  . OLECRANON BURSECTOMY  07/23/2011   Procedure: OLECRANON BURSA;  Surgeon: Lorn Junes, MD;  Location: [place];  Service: Orthopedics;  Laterality: Left;  excision olecranon bursa left elbow patient had supraclaviular block in preop  . PERIPHERAL VASCULAR ANGIOGRAM  07/19/1994   No intervention-recommend medical therapy  . PERIPHERAL VASCULAR ANGIOGRAM  01/17/1995   Right SFA 80-90# lesion, dilatation performed with a Cordis "  Optz 5" 62m x 2cm, inflated a 4-7.5atm for 40 to 70 seconds. Completion angiogram done by hand with "bolus chase" runoff from Primal SFA to foot. [place] in reduction of 80-90% to 20% with excellent flow and without dissection  . PERIPHERAL VASCULAR ANGIOGRAM  09/10/2013   Occluded left SFA, heavily calcified with reconstitution at adductor canal. Three-vessel runof.;; Right lower extremity noted 50% ostial right common iliac stenosis with roughly 20 mm gradient. Not considered significant.f  . right hand surgery    . ROTATOR CUFF REPAIR Right   . Superficial Femoral Atery stent Right 1996  . TONSILLECTOMY     as child  . TRANSTHORACIC ECHOCARDIOGRAM  03/23/2013   Post-Op Echo #1: EF 50-55%, Mild Conc LVH, Mild HK of Inferolateral wall with paradoxical septal motion (c/w post-op state); Well seated 23 mm  Bioprosthetic Valve (area ~1.23 cm2 - normal for valve); Mild MR  . TRANSTHORACIC ECHOCARDIOGRAM  March 2017   EF 50-55%. No RWMA.  GR 1 DD.  23 mm bioprosthetic valve functioning normally.  Mild biatrial dilation.  .Marland KitchenTRIGGER FINGER RELEASE  07/23/2011   Procedure: RELEASE TRIGGER FINGER/A-1 PULLEY;  Surgeon: RLorn Junes MD;  [place]  Service: Orthopedics;  Laterality: Left;  release trigger left ring finger patient had supraclavicular block in preop     Current Meds  Medication Sig  . amiodarone (PACERONE) 200 MG tablet Take 1 tablet (200 mg total) by mouth daily.  .Marland KitchenamLODipine (NORVASC) 10 MG tablet Take 1 tablet (10 mg total) by mouth daily.  .Marland Kitchenaspirin 81 MG EC tablet Take 1 tablet (81 mg total) by mouth daily.  .Marland Kitchenatorvastatin (LIPITOR) 80 MG tablet Take 1 tablet (80 mg total) by mouth daily.  . cilostazol (PLETAL) 100 MG tablet Take 1 tablet (100 mg total) by mouth 2 (two) times daily.  . clopidogrel (PLAVIX) 75 MG tablet TAKE 1 TABLET BY MOUTH  DAILY  . ezetimibe (ZETIA) 10 MG tablet Take 1 tablet (10 mg total) by mouth daily.  . fish oil-omega-3 fatty acids 1000 MG capsule Take 1 g by mouth 2 (two) times daily.   .Marland Kitchenlisinopril (PRINIVIL) 10 MG tablet Take 1 tablet (10 mg total) by mouth daily.  . Multiple Vitamins-Minerals (MULTIVITAMIN WITH MINERALS) tablet Take 1 tablet by mouth daily.  . vitamin E 400 UNIT capsule Take 2 capsules by mouth daily.  . [DISCONTINUED] ezetimibe (ZETIA) 10 MG tablet Take 1 tablet (10 mg total) by mouth daily.  . [DISCONTINUED] metoprolol tartrate (LOPRESSOR) 25 MG tablet TAKE ONE-HALF TABLET BY  MOUTH TWO TIMES DAILY   Current Facility-Administered Medications for the 10/04/16 encounter (Office Visit) with DLeonie Man MD  Medication  . 0.9 %  sodium chloride infusion    No Known Allergies  Social History   Social History  . Marital status: Married    Spouse name: N/A  . Number of children: N/A  . Years of  education: N/A   Social History Main Topics  . Smoking status: Former Smoker    Packs/day: 0.50    Years: 60.00    Types: Cigarettes    Quit date: 01/31/2013  . Smokeless tobacco: Never Used  . Alcohol use No  . Drug use: No  . Sexual activity: Not Asked   Other Topics Concern  . None   Social History Narrative   Married with no children. He long-term smoker who quit prior to his aVR in 2014.    Up until his operation  he worked part-time at [employer].  He is not yet gone back to work postoperatively.   He chose not be cardiac rehabilitation, but has gone back to walking exercising daily.    family history includes Cancer in his mother; Hypertension in his mother; Stroke in his father.  Wt Readings from Last 3 Encounters:  10/04/16 150 lb 6.4 oz (68.2 kg)  09/26/16 150 lb (68 kg)  09/19/16 151 lb (68.5 kg)    PHYSICAL EXAM BP (!) 141/73   Pulse 80   Ht 5' 3.5" (1.613 m)   Wt 150 lb 6.4 oz (68.2 kg)   BMI 26.22 kg/m  General appearance: alert, cooperative, appears stated age, no distress and Well-nourished, well-groomed. Neck: no adenopathy, no carotid bruit and no JVD HEENT: [place]/AT, EOMI, MMM, anicteric sclera Lungs: CTAB, normal percussion bilaterally and non-labored; good air movement. Mildly diminished basal breath sounds but no rhonchi or rales. Heart: RRR, normal S1 with split physiologically split S2. Early peaking 1/6C-D SEM at RUSB --> carotids. No other rubs gallops or murmurs. Abdomen: soft, non-tender; bowel sounds normal; no masses,  no organomegaly;  Extremities: extremities normal, atraumatic, no cyanosis, or edema Pulses: 2+ and symmetric radial pulses. He does have lower TIMI pulses that are significantly reduced: Faint right DP and PT. Radiated barely palpable left PT and DP Skin: mobility and turgor normal and Mild stigmata of PAD bilateral lower extremities Neurologic: Mental status: Alert, oriented, thought content appropriate; pleasant mood  and normal affect. Cranial nerves: normal (II-XII grossly intact)    Adult ECG Report Not checked  Other studies Reviewed: Additional studies/ records that were reviewed today include:  Recent Labs: March 2018 reviewed from PCP notes  Na+ 139, K+ 4.9, Cl- 103, HCO3- 25.2 , BUN 32, Cr 1.5, Glu 90% ,  1Ca2+  10.5; AST 30, ALT,  34; AlkP 85,  T Bili 0.4, TP  6.9,Alb  4.3   CBC: W 6.9, H/H 13.6/41.9. Platelet 201; free T4 0.68  TC 189, TG 92, HDL 67, LDL 104   ASSESSMENT / PLAN: Problem List Items Addressed This Visit    Aortic valve replaced - Primary (Chronic)    Stable murmur. He has not had an echo to evaluate his valve and a long time despite having DVT. Provided he is stable between now and his next visit, I think we can order one next visit and then follow periodically.      CAD S/P PTCA of CFX X 2 in '93 (Chronic)    Mostly moderate disease noted by cath last year in the setting of VT. No active angina symptoms. He is on aspirin, Plavix and Pletal more for his PAD than CAD.  He is on lisinopril and beta blocker alone amiodarone. Plan is to stop metoprolol due to fatigue. He is on statin, Zetia and omega-3 fatty acids -unfortunately, but is not yet controlled.      Relevant Medications   ezetimibe (ZETIA) 10 MG tablet   Dyslipidemia, goal LDL below 70 (Chronic)    Unfortunately, despite being on max dose of atorvastatin along with Zetia and fish oil, his lipids are still not controlled. Plan: Refer to Cardiovascular Risk Reduction clinic here at [facility] office. This is a team run by our pharmacists -- consider possibility PCSK9 inhibitor.      Relevant Medications   ezetimibe (ZETIA) 10 MG tablet   Essential hypertension (Chronic)    Relatively well-controlled. I don't think that stopping low-dose beta blocker will affect his blood  pressure that much. He is on amlodipine plus lisinopril. He had been on HCTZ. If his pressures increase, would increase lisinopril to 20 mg  daily      Relevant Medications   ezetimibe (ZETIA) 10 MG tablet   Fatigue due to treatment    His heart assaults truly causes fatigue, but certainly having both amiodarone and beta blocker makes it hard for him to be able to respond appropriately.  Next plan: DC metoprolol.      PAF (paroxysmal atrial fibrillation), post op    No evidence of recurrence. Now on amiodarone for prior VT. Not on anticoagulation because he has not had any recurrence.      Relevant Medications   ezetimibe (ZETIA) 10 MG tablet   Peripheral vascular disease - s/p L Iliac stent (Chronic)    Medical management for left SFA occlusion. Successfully quit smoking. Continue walking to improve collateral flow. Continue cardiac medications as noted above along with Pletal.      Relevant Medications   ezetimibe (ZETIA) 10 MG tablet   Sustained ventricular tachycardia (HCC) (Chronic)    Initially noted in May 2017 and then VT storm later on in June even really had an ICD placed. Like it is burned out his first ICD. He is now on amiodarone for maintenance and has a new AICD. Far as I can tell, no recurrent symptoms of VT/VF.  Plan: Continue maintenance with amiodarone. -- He will need annual LFT, and TFTs checked. Also needs annual eye exams. He has underlying lung disease, not sure how beneficial it would be to follow PFTs unless we have a baseline from his initial index of therapy. At this point, we decided to monitor for worsening from pulmonary symptoms.  Will defer management to EP.      Relevant Medications   ezetimibe (ZETIA) 10 MG tablet      Current medicines are reviewed at length with the patient today. (+/- concerns) N/A The following changes have been made:  -- See below  Patient Instructions  MEDICATION  STOP METOPROLOL  WILL DISCUSS WITH [facility]-PHARMACIST  - ABOUT CHOLESTEROL TODAY    NO OTHER CHANGES   Your physician wants you to follow-up in [place] DR Levante Simones. You will receive  a reminder letter in the mail two months in advance. If you don't receive a letter, please call our office to schedule the follow-up appointment.   If you need a refill on your cardiac medications before your next appointment, please call your pharmacy.    Studies Ordered:   No orders of the defined types were placed in this encounter.     Ralph Drake, M.D., M.S. Interventional Cardiologist   Pager # [phone] Phone # [phone] [address] [place], [place] [address]

## 2016-10-04 NOTE — Patient Instructions (Signed)
MEDICATION  STOP METOPROLOL  WILL DISCUSS WITH [facility]-PHARMACIST  - ABOUT CHOLESTEROL TODAY    NO OTHER CHANGES   Your physician wants you to follow-up in [place] DR HARD[place]. You will receive a reminder letter in the mail two months in advance. If you don't receive a letter, please call our office to schedule the follow-up appointment.   If you need a refill on your cardiac medications before your next appointment, please call your pharmacy.

## 2016-10-06 ENCOUNTER — Encounter: Payer: Self-pay | Admitting: Cardiology

## 2016-10-06 DIAGNOSIS — R5383 Other fatigue: Secondary | ICD-10-CM | POA: Insufficient documentation

## 2016-10-06 NOTE — Assessment & Plan Note (Signed)
Unfortunately, despite being on max dose of atorvastatin along with Zetia and fish oil, his lipids are still not controlled. Plan: Refer to Cardiovascular Risk Reduction clinic here at [facility] office. This is a team run by our pharmacists -- consider possibility PCSK9 inhibitor.

## 2016-10-06 NOTE — Assessment & Plan Note (Signed)
Medical management for left SFA occlusion. Successfully quit smoking. Continue walking to improve collateral flow. Continue cardiac medications as noted above along with Pletal.

## 2016-10-06 NOTE — Assessment & Plan Note (Signed)
Initially noted in May 2017 and then VT storm later on in June even really had an ICD placed. Like it is burned out his first ICD. He is now on amiodarone for maintenance and has a new AICD. Far as I can tell, no recurrent symptoms of VT/VF.  Plan: Continue maintenance with amiodarone. -- He will need annual LFT, and TFTs checked. Also needs annual eye exams. He has underlying lung disease, not sure how beneficial it would be to follow PFTs unless we have a baseline from his initial index of therapy. At this point, we decided to monitor for worsening from pulmonary symptoms.  Will defer management to EP.

## 2016-10-06 NOTE — Assessment & Plan Note (Signed)
Stable murmur. He has not had an echo to evaluate his valve and a long time despite having DVT. Provided he is stable between now and his next visit, I think we can order one next visit and then follow periodically.

## 2016-10-06 NOTE — Assessment & Plan Note (Signed)
Relatively well-controlled. I don't think that stopping low-dose beta blocker will affect his blood pressure that much. He is on amlodipine plus lisinopril. He had been on HCTZ. If his pressures increase, would increase lisinopril to 20 mg daily

## 2016-10-06 NOTE — Assessment & Plan Note (Signed)
No evidence of recurrence. Now on amiodarone for prior VT. Not on anticoagulation because he has not had any recurrence.

## 2016-10-06 NOTE — Assessment & Plan Note (Signed)
Mostly moderate disease noted by cath last year in the setting of VT. No active angina symptoms. He is on aspirin, Plavix and Pletal more for his PAD than CAD.  He is on lisinopril and beta blocker alone amiodarone. Plan is to stop metoprolol due to fatigue. He is on statin, Zetia and omega-3 fatty acids -unfortunately, but is not yet controlled.

## 2016-10-06 NOTE — Assessment & Plan Note (Signed)
His heart assaults truly causes fatigue, but certainly having both amiodarone and beta blocker makes it hard for him to be able to respond appropriately.  Next plan: DC metoprolol.

## 2016-10-31 ENCOUNTER — Other Ambulatory Visit: Payer: Self-pay | Admitting: Cardiology

## 2016-10-31 DIAGNOSIS — E785 Hyperlipidemia, unspecified: Secondary | ICD-10-CM

## 2016-12-24 ENCOUNTER — Ambulatory Visit (INDEPENDENT_AMBULATORY_CARE_PROVIDER_SITE_OTHER): Payer: Medicare Other | Admitting: *Deleted

## 2016-12-24 ENCOUNTER — Telehealth: Payer: Self-pay | Admitting: Cardiology

## 2016-12-24 DIAGNOSIS — I428 Other cardiomyopathies: Secondary | ICD-10-CM | POA: Diagnosis not present

## 2016-12-24 NOTE — Telephone Encounter (Signed)
LMOVM reminding pt to send remote transmission.   

## 2016-12-25 NOTE — Progress Notes (Signed)
Remote ICD transmission.   

## 2016-12-26 ENCOUNTER — Encounter: Payer: Self-pay | Admitting: Cardiology

## 2016-12-26 LAB — CUP PACEART REMOTE DEVICE CHECK
Brady Statistic AP VP Percent: 4.1 %
Brady Statistic AP VS Percent: 88 %
Brady Statistic AS VP Percent: 1 %
Brady Statistic RA Percent Paced: 90 %
Brady Statistic RV Percent Paced: 4.1 %
Date Time Interrogation Session: 20180716163421
HighPow Impedance: 65 Ohm
HighPow Impedance: 65 Ohm
Implantable Lead Implant Date: 20170601
Implantable Lead Location: 753859
Implantable Lead Location: 753860
Lead Channel Impedance Value: 510 Ohm
Lead Channel Pacing Threshold Amplitude: 1 V
Lead Channel Sensing Intrinsic Amplitude: 3.8 mV
Lead Channel Setting Pacing Pulse Width: 0.5 ms
MDC IDC LEAD IMPLANT DT: 20170601
MDC IDC MSMT BATTERY REMAINING LONGEVITY: 81 mo
MDC IDC MSMT BATTERY REMAINING PERCENTAGE: 92 %
MDC IDC MSMT BATTERY VOLTAGE: 3.16 V
MDC IDC MSMT LEADCHNL RA PACING THRESHOLD AMPLITUDE: 0.5 V
MDC IDC MSMT LEADCHNL RA PACING THRESHOLD PULSEWIDTH: 0.5 ms
MDC IDC MSMT LEADCHNL RV IMPEDANCE VALUE: 440 Ohm
MDC IDC MSMT LEADCHNL RV PACING THRESHOLD PULSEWIDTH: 0.5 ms
MDC IDC MSMT LEADCHNL RV SENSING INTR AMPL: 2.8 mV
MDC IDC PG IMPLANT DT: 20171221
MDC IDC SET LEADCHNL RA PACING AMPLITUDE: 2 V
MDC IDC SET LEADCHNL RV PACING AMPLITUDE: 2.5 V
MDC IDC SET LEADCHNL RV SENSING SENSITIVITY: 0.5 mV
MDC IDC STAT BRADY AS VS PERCENT: 8.1 %
Pulse Gen Serial Number: 7390376

## 2017-01-04 ENCOUNTER — Other Ambulatory Visit: Payer: Self-pay | Admitting: Physician Assistant

## 2017-02-22 ENCOUNTER — Other Ambulatory Visit: Payer: Self-pay | Admitting: Cardiology

## 2017-02-22 ENCOUNTER — Other Ambulatory Visit: Payer: Self-pay | Admitting: Physician Assistant

## 2017-02-22 ENCOUNTER — Other Ambulatory Visit: Payer: Self-pay | Admitting: Internal Medicine

## 2017-02-22 DIAGNOSIS — E785 Hyperlipidemia, unspecified: Secondary | ICD-10-CM

## 2017-02-22 MED ORDER — AMIODARONE HCL 200 MG PO TABS: 200.0000 mg | ORAL_TABLET | Freq: Every day | ORAL | 1 refills | Status: DC

## 2017-03-12 ENCOUNTER — Encounter (HOSPITAL_COMMUNITY): Payer: Self-pay | Admitting: Internal Medicine

## 2017-03-15 ENCOUNTER — Other Ambulatory Visit: Payer: Self-pay

## 2017-03-15 MED ORDER — CLOPIDOGREL BISULFATE 75 MG PO TABS: 75.0000 mg | ORAL_TABLET | Freq: Every day | ORAL | 0 refills | Status: DC

## 2017-03-15 MED ORDER — AMIODARONE HCL 200 MG PO TABS: 200.0000 mg | ORAL_TABLET | Freq: Every day | ORAL | 0 refills | Status: DC

## 2017-03-25 ENCOUNTER — Ambulatory Visit (INDEPENDENT_AMBULATORY_CARE_PROVIDER_SITE_OTHER): Payer: Medicare Other | Admitting: *Deleted

## 2017-03-25 DIAGNOSIS — I428 Other cardiomyopathies: Secondary | ICD-10-CM | POA: Diagnosis not present

## 2017-03-26 NOTE — Progress Notes (Signed)
Remote ICD transmission.   

## 2017-03-28 LAB — CUP PACEART REMOTE DEVICE CHECK
Battery Remaining Longevity: 79 mo
Battery Remaining Percentage: 90 %
Battery Voltage: 3.07 V
Brady Statistic AS VS Percent: 11 %
HIGH POWER IMPEDANCE MEASURED VALUE: 65 Ohm
HIGH POWER IMPEDANCE MEASURED VALUE: 65 Ohm
Implantable Lead Implant Date: 20170601
Implantable Lead Location: 753860
Implantable Lead Model: 7122
Implantable Pulse Generator Implant Date: 20171221
Lead Channel Impedance Value: 400 Ohm
Lead Channel Pacing Threshold Amplitude: 0.5 V
Lead Channel Pacing Threshold Pulse Width: 0.5 ms
Lead Channel Sensing Intrinsic Amplitude: 2.4 mV
Lead Channel Setting Pacing Amplitude: 2 V
Lead Channel Setting Sensing Sensitivity: 0.5 mV
MDC IDC LEAD IMPLANT DT: 20170601
MDC IDC LEAD LOCATION: 753859
MDC IDC MSMT LEADCHNL RA IMPEDANCE VALUE: 510 Ohm
MDC IDC MSMT LEADCHNL RA SENSING INTR AMPL: 3.1 mV
MDC IDC MSMT LEADCHNL RV PACING THRESHOLD AMPLITUDE: 1 V
MDC IDC MSMT LEADCHNL RV PACING THRESHOLD PULSEWIDTH: 0.5 ms
MDC IDC SESS DTM: 20181016141518
MDC IDC SET LEADCHNL RV PACING AMPLITUDE: 2.5 V
MDC IDC SET LEADCHNL RV PACING PULSEWIDTH: 0.5 ms
MDC IDC STAT BRADY AP VP PERCENT: 3.2 %
MDC IDC STAT BRADY AP VS PERCENT: 85 %
MDC IDC STAT BRADY AS VP PERCENT: 1 %
MDC IDC STAT BRADY RA PERCENT PACED: 87 %
MDC IDC STAT BRADY RV PERCENT PACED: 3.2 %
Pulse Gen Serial Number: 7390376

## 2017-03-29 ENCOUNTER — Encounter: Payer: Self-pay | Admitting: Cardiology

## 2017-04-08 ENCOUNTER — Ambulatory Visit (INDEPENDENT_AMBULATORY_CARE_PROVIDER_SITE_OTHER): Payer: Medicare Other | Admitting: Cardiology

## 2017-04-08 ENCOUNTER — Encounter: Payer: Self-pay | Admitting: Cardiology

## 2017-04-08 VITALS — BP 140/68 | HR 84 | Ht 63.5 in | Wt 150.4 lb

## 2017-04-08 DIAGNOSIS — I739 Peripheral vascular disease, unspecified: Secondary | ICD-10-CM

## 2017-04-08 DIAGNOSIS — Z9861 Coronary angioplasty status: Secondary | ICD-10-CM

## 2017-04-08 DIAGNOSIS — I2510 Atherosclerotic heart disease of native coronary artery without angina pectoris: Secondary | ICD-10-CM | POA: Diagnosis not present

## 2017-04-08 DIAGNOSIS — I10 Essential (primary) hypertension: Secondary | ICD-10-CM | POA: Diagnosis not present

## 2017-04-08 DIAGNOSIS — E785 Hyperlipidemia, unspecified: Secondary | ICD-10-CM

## 2017-04-08 DIAGNOSIS — I472 Ventricular tachycardia, unspecified: Secondary | ICD-10-CM

## 2017-04-08 DIAGNOSIS — R0989 Other specified symptoms and signs involving the circulatory and respiratory systems: Secondary | ICD-10-CM | POA: Diagnosis not present

## 2017-04-08 DIAGNOSIS — Z952 Presence of prosthetic heart valve: Secondary | ICD-10-CM

## 2017-04-08 DIAGNOSIS — I480 Paroxysmal atrial fibrillation: Secondary | ICD-10-CM | POA: Diagnosis not present

## 2017-04-08 NOTE — Assessment & Plan Note (Signed)
Currently still doing medical management for left SFA occlusion.  He previously has declined surgical intervention.  Plan is to continue walking for formation of collaterals. Continues to be on Pletal along with full cardiac medications.  He has not had Dopplers checked in quite some time.  We will proceed with lower extremity arterial Dopplers since he is noting more claudication at this point.

## 2017-04-08 NOTE — Assessment & Plan Note (Signed)
No recurrent A. fib.  Still on amiodarone. Not on full anticoagulation because of no recurrence.

## 2017-04-08 NOTE — Assessment & Plan Note (Signed)
No further active anginal symptoms.  Stable coronary disease by catheterization last year.  No further VT.  Nothing to suggest ischemia. Continues to be on aspirin Plavix and Pletal along with statin and amlodipine and lisinopril. He is not on a beta-blocker because of amiodarone.

## 2017-04-08 NOTE — Assessment & Plan Note (Signed)
Soft bilateral left greater than right bruits -> we will check carotid Dopplers along with lower extremity Dopplers.

## 2017-04-08 NOTE — Patient Instructions (Signed)
SCHEDULE AT [facility] IN MARCH 2019 Your physician has requested that you have an echocardiogram. Echocardiography is a painless test that uses sound waves to create images of your heart. It provides your doctor with information about the size and shape of your heart and how well your heart's chambers and valves are working. This procedure takes approximately one hour. There are no restrictions for this procedure.   SCHEDULE AT [place] IN MARCH 2019 Your physician has requested that you have a carotid duplex. This test is an ultrasound of the carotid arteries in your neck. It looks at blood flow through these arteries that supply the brain with blood. Allow one hour for this exam. There are no restrictions or special instructions.  Your physician has requested that you have a lower extremity arterial duplex. This test is an ultrasound of the arteries in the legs. It looks at arterial blood flow in the legs. Allow one hour for Lower Arterial scans. There are no restrictions or special instructions   Your physician wants you to follow-up in [place]. You will receive a reminder letter in the mail two months in advance. If you don't receive a letter, please call our office to schedule the follow-up appointment.   If you need a refill on your cardiac medications before your next appointment, please call your pharmacy.

## 2017-04-08 NOTE — Assessment & Plan Note (Signed)
Blood pressures are just little bit higher today than usual.  He had been on HCTZ which apparently has been discontinued by his PCP likely related to his some recent renal insufficiency.  Does have some hyperkalemia.  Need to be careful with lisinopril -was reduced from 20-10 mg as well. Otherwise on stable dose of amlodipine 10 mg.

## 2017-04-08 NOTE — Assessment & Plan Note (Signed)
He has had labs checked recently by his PCP, but this does not include lipids.  Likely is due to have labs checked soon.  He is on Zetia plus atorvastatin at high dose. Pending what his most recent labs showed, may want to consider referral to our Lipid Clinic ([place]) for PCSK 9 inhibitor.

## 2017-04-08 NOTE — Assessment & Plan Note (Signed)
He has had stable claudication, but now is noticing that it is limiting his activity more.  Both legs are bothering him more than just the left now.  We will recheck Dopplers.  Continue Pletal.  Continue ambulation.

## 2017-04-08 NOTE — Assessment & Plan Note (Signed)
Status post by prostatic valve replacement.  Last echo was in March 2017.  Plan was to recheck in 2 years.  We will order follow-up echocardiogram in March 2019 along with his Dopplers. No obvious murmur

## 2017-04-08 NOTE — Progress Notes (Signed)
PCP: [name], MD  Clinic Note: Chief Complaint  Patient presents with  . Follow-up    pt has no complaints today   . Coronary Artery Disease  . PAD  . Cardiac Valve Problem    s/p AVR    HPI: Ralph Drake is a 82 y.o. male with a PMH below who presents today for Six-month follow-up for CAD/PAD & s/p AVR  CAD (coronary angioplasty x 2 1993),   PAD (SFA stent 1996, SFA occlusion 2015) ,   H/o AS: s/p AVR (23 Edwards Magna Ease Bovine Pericardial Tissue Valve 2014) with Cardiomyopathy (mixed Valvular & Ischemic).   Recurrent VT -- s/p ICD - multiple shocks -- > ICD Implant 11/10/2015 (Dr. [name]) - St. Jude ICD L Ax. V.; readmitted for frequent VT - new ICD  re-implant for device failure 05/2016. (Dr. [name])  He has known solitary kidney - CKD III along with HTN, HLD.   Ralph Drake was last seen in April 2018 -he was doing well at that time.  Some exertional dyspnea, but nothing significant.  Also stable claudication.  No further episodes of VT.  Recent Hospitalizations: None  Studies Personally Reviewed - (if available, images/films reviewed: From Epic Chart or Care Everywhere)  No further studies  Interval History: Ralph Drake presents today overall doing fairly well.  The main thing he notices that he is walking is limited somewhat by claudication.  He still tries to walk about a 1, but does note he will need to stop several times along the way-1.5 miles daily to allow his claudication to improve.  He denies any significant exertional dyspnea or chest tightness/pressure.  No PND, orthopnea or notable edema. He has not had any further episodes of rapid irregular heartbeat/palpitations to suspect recurrence of VT.  His understanding is that there is been no further episodes noted on his ICD monitoring.  No PND, orthopnea or edema. No lightheadedness, dizziness, weakness or syncope/near syncope.No TIA/amaurosis fugax symptoms. No melena, hematochezia, hematuria, or  epstaxis. + claudication - bilateral, stops several times when walking 1.5 miles.  Not @ rest or regular activity.  ROS: A comprehensive was performed. Review of Systems  Constitutional: Negative for malaise/fatigue and weight loss.  HENT: Negative for congestion.   Eyes: Negative.   Respiratory: Positive for shortness of breath (Baseline). Negative for cough and sputum production.   Cardiovascular: Negative.   Gastrointestinal: Negative for heartburn and nausea.  Genitourinary: Negative for dysuria and flank pain.  Musculoskeletal: Positive for joint pain (Normal arthritis pains.). Negative for back pain and myalgias.  Neurological: Positive for dizziness (Occasional orthostatic dizziness).  Endo/Heme/Allergies: Negative for environmental allergies.  Psychiatric/Behavioral: Negative.   All other systems reviewed and are negative.   I have reviewed and (if needed) personally updated the patient's problem list, medications, allergies, past medical and surgical history, social and family history.   Past Medical History:  Diagnosis Date  . Atrial fibrillation ([place])   . Bradycardia   . CAD S/P percutaneous coronary angioplasty 1993   POBA to Cx x 2 occasions: 10/2015:Mod (mod-severe CAD): p-mCx 50%. mLAD ~40%, mRCA ~40%. dRCA ~70%, No culprit lesion for VT.  [name] Dyslipidemia, goal LDL below 70   . Former heavy tobacco smoker     quit in October 2014  . GERD (gastroesophageal reflux disease)   . Hypertension   . Olecranon bursitis of left elbow   . Osteoarthritis   . Peripheral vascular disease ([place])     H/O R SFA stent,  L Iliac Stent 1996;;  Lower Extremity Angiography April 2015: Occluded left SFA reconstituting at end of the canal, heavily calcified. Three-vessel runoff..;; RCA 50% ostial right common iliac, patent SFA stent -> Referred to Dr. Trula Slade of [facility].  . S/P AVR (aortic valve replacement) 02/10/2013   23 Edwards Magna Ease Pericardial Tissue Valve  . Severe aortic  stenosis by prior echocardiogram    per Dr. Rollene Fare 2012; referred for aortic valve replacement  . Shortness of breath    Chronic  . Trigger ring finger of left hand   . Tubular adenoma of colon   . VT (ventricular tachycardia) ([place])    hemodynamically significant VT on 11/07/2015, nonobstructive CAD, no culprit lesion, underwent St Jude Dual Chamber ICD by Dr. [name]    Past Surgical History:  Procedure Laterality Date  . ABDOMINAL ANGIOGRAM  09/10/2013   Procedure: ABDOMINAL ANGIOGRAM;  Surgeon: [name], MD;  Location: [facility] CATH LAB;  Service: Cardiovascular;;  . AORTIC VALVE REPLACEMENT  02/10/2013   Dr. Servando Snare: [address] Ease Pericardia  . AORTIC VALVE REPLACEMENT N/A 02/10/2013   AORTIC VALVE REPLACEMENT (AVR);  Surgeon: Grace Isaac, MD;  Location: [place];  Service: Open Heart Surgery;  Laterality: N/A  . ARTERIAL DOPPLER - PRE-CABG  02/06/2013   Bilateral 1-39% ICA stenosis  . CARDIAC CATHETERIZATION  August 2014   Nonobstructive 30-50% RCA. No significant LAD disease. 30% mid circumflex.  [name] CARDIAC CATHETERIZATION  11/10/2009   No intervention - recommend medical management  . CARDIAC CATHETERIZATION N/A 11/09/2015   Procedure: Left Heart Cath and Coronary Angiography;  Surgeon: Belva Crome, MD;  Location: [facility] INVASIVE CV LAB: Mod (mod-severe CAD): p-mCx 50%. mLAD ~40%, mRCA ~40%. dRCA ~70%, No culprit lesion for VT.  [name] CARDIOVASCULAR STRESS TEST  06/24/2012   Diffuse LV hypokinesis with moderately depressed systolic function and EF 28%,  . CORONARY ANGIOPLASTY  1993   Circumflex x 2   . EP IMPLANTABLE DEVICE N/A 11/10/2015   Procedure: ICD Implant;  Surgeon: Evans Lance, MD;  Location: [place] CV LAB;  Service: Cardiovascular;  Laterality: N/A;  . ICD GENERATOR CHANGEOUT N/A 05/31/2016   Procedure: ICD Generator Changeout;  Surgeon: Thompson Grayer, MD;  Location: [place] CV LAB;  Service: Cardiovascular;  Laterality: N/A;  . ILIAC ARTERY STENT Left 1996    . INTRAOPERATIVE TRANSESOPHAGEAL ECHOCARDIOGRAM N/A 02/10/2013   Procedure: INTRAOPERATIVE TRANSESOPHAGEAL ECHOCARDIOGRAM;  Surgeon: Grace Isaac, MD;  Location: [place];  Service: Open Heart Surgery;  Laterality: N/A;  . LEFT AND RIGHT HEART CATHETERIZATION WITH CORONARY ANGIOGRAM N/A 01/28/2013   Procedure: LEFT AND RIGHT HEART CATHETERIZATION WITH CORONARY ANGIOGRAM;  Surgeon: Leonie Man, MD;  Location[facility] CATH LAB;  Service: Cardiovascular;  Laterality: N/A;  . LOWER EXTREMITY ANGIOGRAM N/A 09/10/2013   Procedure: LOWER EXTREMITY ANGIOGRAM;  Surgeon: [name], MD;  Location: [facility] CATH LAB;  Service: Cardiovascular;  Laterality: N/A;  . OLECRANON BURSECTOMY  07/23/2011   Procedure: OLECRANON BURSA;  Surgeon: Lorn Junes, MD;  Location: [place];  Service: Orthopedics;  Laterality: Left;  excision olecranon bursa left elbow patient had supraclaviular block in preop  . PERIPHERAL VASCULAR ANGIOGRAM  07/19/1994   No intervention-recommend medical therapy  . PERIPHERAL VASCULAR ANGIOGRAM  01/17/1995   Right SFA 80-90# lesion, dilatation performed with a Cordis "Optz 5" 38mm x 2cm, inflated a 4-7.5atm for 40 to 70 seconds. Completion angiogram done by hand with "bolus chase" runoff from  Primal SFA to foot. [place]utling in reduction of 80-90% to 20% with excellent flow and without dissection  . PERIPHERAL VASCULAR ANGIOGRAM  09/10/2013   Occluded left SFA, heavily calcified with reconstitution at adductor canal. Three-vessel runof.;; Right lower extremity noted 50% ostial right common iliac stenosis with roughly 20 mm gradient. Not considered significant.f  . right hand surgery    . ROTATOR CUFF REPAIR Right   . Superficial Femoral Atery stent Right 1996  . TONSILLECTOMY     as child  . TRANSTHORACIC ECHOCARDIOGRAM  03/23/2013   Post-Op Echo #1: EF 50-55%, Mild Conc LVH, Mild HK of Inferolateral wall with paradoxical septal motion (c/w post-op state); Well seated 23 mm  Bioprosthetic Valve (area ~1.23 cm2 - normal for valve); Mild MR  . TRANSTHORACIC ECHOCARDIOGRAM  March 2017   EF 50-55%. No RWMA.  GR 1 DD.  23 mm bioprosthetic valve functioning normally.  Mild biatrial dilation.  [name] TRIGGER FINGER RELEASE  07/23/2011   Procedure: RELEASE TRIGGER FINGER/A-1 PULLEY;  Surgeon: Lorn Junes, MD;  Location: [place];  Service: Orthopedics;  Laterality: Left;  release trigger left ring finger patient had supraclavicular block in preop    Current Meds  Medication Sig  . amiodarone (PACERONE) 200 MG tablet Take 1 tablet (200 mg total) by mouth daily.  [name] amiodarone (PACERONE) 200 MG tablet Take 1 tablet (200 mg total) by mouth daily. PLEASE KEEP 04/08/17 APPT FOR FUTURE REFILL AUTHORIZATION  . amLODipine (NORVASC) 10 MG tablet TAKE 1 TABLET BY MOUTH  EVERY DAY  . aspirin 81 MG EC tablet Take 1 tablet (81 mg total) by mouth daily.  [name] atorvastatin (LIPITOR) 80 MG tablet TAKE 1 TABLET BY MOUTH  DAILY  . cilostazol (PLETAL) 100 MG tablet TAKE 1 TABLET BY MOUTH  TWICE A DAY  . clopidogrel (PLAVIX) 75 MG tablet Take 1 tablet (75 mg total) by mouth daily. PLEASE KEEP 04/08/17 APPT FOR FUTURE REFILLS  . ezetimibe (ZETIA) 10 MG tablet Take 1 tablet (10 mg total) by mouth daily.  . fish oil-omega-3 fatty acids 1000 MG capsule Take 1 g by mouth 2 (two) times daily.   [name] levothyroxine (SYNTHROID, LEVOTHROID) 25 MCG tablet Take 20 mcg by mouth daily.  [name] lisinopril (PRINIVIL,ZESTRIL) 10 MG tablet TAKE 1 TABLET BY MOUTH  DAILY  . Multiple Vitamins-Minerals (MULTIVITAMIN WITH MINERALS) tablet Take 1 tablet by mouth daily.  . vitamin E 400 UNIT capsule Take 2 capsules by mouth daily.   Current Facility-Administered Medications for the 04/08/17 encounter (Office Visit) with Leonie Man, MD  Medication  . 0.9 %  sodium chloride infusion    No Known Allergies  Social History   Social History  . Marital status: Married    Spouse name: N/A  . Number of  children: N/A  . Years of education: N/A   Social History Main Topics  . Smoking status: Former Smoker    Packs/day: 0.50    Years: 60.00    Types: Cigarettes    Quit date: 01/31/2013  . Smokeless tobacco: Never Used  . Alcohol use No  . Drug use: No  . Sexual activity: Not Asked   Other Topics Concern  . None   Social History Narrative   Married with no children. He long-term smoker who quit prior to his aVR in 2014.    Up until his operation he worked part-time at [employer].  He is not yet gone back to work postoperatively.   He  chose not be cardiac rehabilitation, but has gone back to walking exercising daily.    family history includes Cancer in his mother; Hypertension in his mother; Stroke in his father.  Wt Readings from Last 3 Encounters:  04/08/17 150 lb 6.4 oz (68.2 kg)  10/04/16 150 lb 6.4 oz (68.2 kg)  09/26/16 150 lb (68 kg)    PHYSICAL EXAM BP 140/68   Pulse 84   Ht 5' 3.5" (1.613 m)   Wt 150 lb 6.4 oz (68.2 kg)   BMI 26.22 kg/m  Physical Exam  Constitutional: He is oriented to person, place, and time. He appears well-developed and well-nourished. No distress.  Healthy appearing  HENT:  Head: Normocephalic and atraumatic.  Eyes: Pupils are equal, round, and reactive to light. EOM are normal. Left eye exhibits no discharge.  Neck: Normal range of motion. Neck supple. No hepatojugular reflux and no JVD present. Carotid bruit is present (R>L).  Cardiovascular: Normal rate, regular rhythm and intact distal pulses.   No extrasystoles are present. PMI is not displaced.  Exam reveals decreased pulses (Bilateral AT pulses decreased ). Exam reveals no gallop and no friction rub.   Murmur (Soft 1/6 SEM at RUSB.) heard. Pulmonary/Chest: Effort normal. No respiratory distress. He has no wheezes. He exhibits no tenderness.  Mild diffuse crackles - no rales or rhonchi  Abdominal: Soft. Bowel sounds are normal. He exhibits no distension. There is no  tenderness. There is no rebound.  Musculoskeletal: Normal range of motion. He exhibits no edema.  Neurological: He is alert and oriented to person, place, and time.  Skin: Skin is warm and dry. No rash noted. No erythema.  Psychiatric: He has a normal mood and affect. His behavior is normal. Thought content normal.  Nursing note and vitals reviewed.    Adult ECG Report Checked  Other studies Reviewed: Additional studies/ records that were reviewed today include:  Recent Labs: 02/2017  Na+ 137, K+ 4.8, Cl- 107, HCO3- 24 , BUN 36, Cr 2.1, Glu 98, Ca2+ 10.4; AST 20, ALT, 16 AlkP 68 --> recheck BMP 10/24  Na+ 135, K+ 5.5*, Cl- 105, HCO3- 27 , BUN 26, Cr 1.7, Glu 125,   CBC: W 5.8, H/H 13.3/39.8, Plt 196; TSH 22.8, Free T4 0.6 (just started on Levothyroxine)  Lipids not checked   ASSESSMENT / PLAN: Problem List Items Addressed This Visit    Bilateral claudication of lower limb ([place]) (Chronic)    He has had stable claudication, but now is noticing that it is limiting his activity more.  Both legs are bothering him more than just the left now.  We will recheck Dopplers.  Continue Pletal.  Continue ambulation.      Relevant Orders   VAS [place] LOWER EXTREMITY ARTERIAL DUPLEX   CAD S/P PTCA of CFX X 2 in '93 - Primary (Chronic)    No further active anginal symptoms.  Stable coronary disease by catheterization last year.  No further VT.  Nothing to suggest ischemia. Continues to be on aspirin Plavix and Pletal along with statin and amlodipine and lisinopril. He is not on a beta-blocker because of amiodarone.      Relevant Orders   ECHOCARDIOGRAM COMPLETE   Carotid bruit (Chronic)    Soft bilateral left greater than right bruits -> we will check carotid Dopplers along with lower extremity Dopplers.      Relevant Orders   VAS US CAROTID   Dyslipidemia, goal LDL below 70 (Chronic)    He has had labs  checked recently by his PCP, but this does not include lipids.  Likely is due to have labs  checked soon.  He is on Zetia plus atorvastatin at high dose. Pending what his most recent labs showed, may want to consider referral to our Lipid Clinic ([place]) for PCSK 9 inhibitor.      Essential hypertension (Chronic)    Blood pressures are just little bit higher today than usual.  He had been on HCTZ which apparently has been discontinued by his PCP likely related to his some recent renal insufficiency.  Does have some hyperkalemia.  Need to be careful with lisinopril -was reduced from 20-10 mg as well. Otherwise on stable dose of amlodipine 10 mg.      PAF (paroxysmal atrial fibrillation), post op    No recurrent A. fib.  Still on amiodarone. Not on full anticoagulation because of no recurrence.      Relevant Orders   VAS US CAROTID   ECHOCARDIOGRAM COMPLETE   Peripheral vascular disease - s/p L Iliac stent (Chronic)    Currently still doing medical management for left SFA occlusion.  He previously has declined surgical intervention.  Plan is to continue walking for formation of collaterals. Continues to be on Pletal along with full cardiac medications.  He has not had Dopplers checked in quite some time.  We will proceed with lower extremity arterial Dopplers since he is noting more claudication at this point.      Relevant Orders   VAS [place] LOWER EXTREMITY ARTERIAL DUPLEX   S/P tissue AVR 02/11/13 (Chronic)    Status post by prostatic valve replacement.  Last echo was in March 2017.  Plan was to recheck in 2 years.  We will order follow-up echocardiogram in March 2019 along with his Dopplers. No obvious murmur      Relevant Orders   ECHOCARDIOGRAM COMPLETE   Sustained ventricular tachycardia (HCC) (Chronic)    No further episodes since last ICD insertion.  Currently on amiodarone with some mild hypothyroidism. Recently started on levothyroxine by PCP. He needs to continue to have his annual He is, TFTs and eye exams checked.  Would only  check PFTs if he has worsening pulmonary symptoms.  ICD being monitored by EP      Relevant Orders   ECHOCARDIOGRAM COMPLETE      Current medicines are reviewed at length with the patient today. (+/- concerns) n/a The following changes have been made: n/a  Patient Instructions  SCHEDULE AT [facility] IN MARCH 2019 Your physician has requested that you have an echocardiogram. Echocardiography is a painless test that uses sound waves to create images of your heart. It provides your doctor with information about the size and shape of your heart and how well your heart's chambers and valves are working. This procedure takes approximately one hour. There are no restrictions for this procedure.   SCHEDULE AT [facility] IN MARCH 2019 Your physician has requested that you have a carotid duplex. This test is an ultrasound of the carotid arteries in your neck. It looks at blood flow through these arteries that supply the brain with blood. Allow one hour for this exam. There are no restrictions or special instructions.  Your physician has requested that you have a lower extremity arterial duplex. This test is an ultrasound of the arteries in the legs. It looks at arterial blood flow in the legs. Allow one hour for Lower Arterial  scans. There are no restrictions or special instructions   Your physician wants you to follow-up in [place]. You will receive a reminder letter in the mail two months in advance. If you don't receive a letter, please call our office to schedule the follow-up appointment.   If you need a refill on your cardiac medications before your next appointment, please call your pharmacy.    Studies Ordered:   Orders Placed This Encounter  Procedures  . ECHOCARDIOGRAM COMPLETE      Glenetta Hew, M.D., M.S. Interventional Cardiologist   Pager # [phone] Phone # [phone] [address] [place], [place] [address]

## 2017-04-08 NOTE — Assessment & Plan Note (Addendum)
No further episodes since last ICD insertion.  Currently on amiodarone with some mild hypothyroidism. Recently started on levothyroxine by PCP. He needs to continue to have his annual He is, TFTs and eye exams checked.  Would only check PFTs if he has worsening pulmonary symptoms.  ICD being monitored by EP

## 2017-04-15 ENCOUNTER — Other Ambulatory Visit: Payer: Self-pay | Admitting: Nephrology

## 2017-04-15 DIAGNOSIS — N179 Acute kidney failure, unspecified: Secondary | ICD-10-CM

## 2017-04-15 DIAGNOSIS — N183 Chronic kidney disease, stage 3 unspecified: Secondary | ICD-10-CM

## 2017-04-18 ENCOUNTER — Ambulatory Visit
Admission: RE | Admit: 2017-04-18 | Discharge: 2017-04-18 | Disposition: A | Discharge status: Home / Self Care | Payer: Medicare Other | Source: Ambulatory Visit | Attending: Nephrology | Admitting: Nephrology

## 2017-04-18 DIAGNOSIS — N183 Chronic kidney disease, stage 3 unspecified: Secondary | ICD-10-CM

## 2017-04-18 DIAGNOSIS — N179 Acute kidney failure, unspecified: Secondary | ICD-10-CM | POA: Insufficient documentation

## 2017-04-26 ENCOUNTER — Other Ambulatory Visit: Payer: Self-pay | Admitting: Cardiology

## 2017-04-26 DIAGNOSIS — E785 Hyperlipidemia, unspecified: Secondary | ICD-10-CM

## 2017-04-26 NOTE — Telephone Encounter (Signed)
Rx(s) sent to pharmacy electronically.  

## 2017-05-08 ENCOUNTER — Encounter: Payer: Self-pay | Admitting: Internal Medicine

## 2017-06-24 ENCOUNTER — Ambulatory Visit (INDEPENDENT_AMBULATORY_CARE_PROVIDER_SITE_OTHER): Payer: Medicare Other | Admitting: *Deleted

## 2017-06-24 DIAGNOSIS — I472 Ventricular tachycardia, unspecified: Secondary | ICD-10-CM

## 2017-06-25 NOTE — Progress Notes (Addendum)
Remote ICD transmission.   

## 2017-06-26 ENCOUNTER — Encounter: Payer: Medicare Other | Admitting: Internal Medicine

## 2017-06-27 ENCOUNTER — Encounter: Payer: Self-pay | Admitting: Cardiology

## 2017-06-28 ENCOUNTER — Encounter: Payer: Self-pay | Admitting: Cardiology

## 2017-06-30 LAB — CUP PACEART REMOTE DEVICE CHECK
Battery Remaining Longevity: 77 mo
Brady Statistic AP VS Percent: 83 %
Brady Statistic AS VP Percent: 1 %
Brady Statistic AS VS Percent: 14 %
Brady Statistic RA Percent Paced: 85 %
Date Time Interrogation Session: 20190118023704
HIGH POWER IMPEDANCE MEASURED VALUE: 63 Ohm
HIGH POWER IMPEDANCE MEASURED VALUE: 63 Ohm
Implantable Lead Implant Date: 20170601
Implantable Lead Location: 753859
Implantable Lead Location: 753860
Lead Channel Pacing Threshold Amplitude: 1 V
Lead Channel Pacing Threshold Pulse Width: 0.5 ms
Lead Channel Sensing Intrinsic Amplitude: 3.4 mV
Lead Channel Setting Pacing Pulse Width: 0.5 ms
Lead Channel Setting Sensing Sensitivity: 0.5 mV
MDC IDC LEAD IMPLANT DT: 20170601
MDC IDC MSMT BATTERY REMAINING PERCENTAGE: 87 %
MDC IDC MSMT BATTERY VOLTAGE: 3.02 V
MDC IDC MSMT LEADCHNL RA IMPEDANCE VALUE: 490 Ohm
MDC IDC MSMT LEADCHNL RA PACING THRESHOLD AMPLITUDE: 0.5 V
MDC IDC MSMT LEADCHNL RV IMPEDANCE VALUE: 390 Ohm
MDC IDC MSMT LEADCHNL RV PACING THRESHOLD PULSEWIDTH: 0.5 ms
MDC IDC MSMT LEADCHNL RV SENSING INTR AMPL: 2.4 mV
MDC IDC PG IMPLANT DT: 20171221
MDC IDC SET LEADCHNL RA PACING AMPLITUDE: 2 V
MDC IDC SET LEADCHNL RV PACING AMPLITUDE: 2.5 V
MDC IDC STAT BRADY AP VP PERCENT: 2.9 %
MDC IDC STAT BRADY RV PERCENT PACED: 2.9 %
Pulse Gen Serial Number: 7390376

## 2017-07-17 ENCOUNTER — Encounter: Payer: Self-pay | Admitting: Internal Medicine

## 2017-07-23 ENCOUNTER — Ambulatory Visit (INDEPENDENT_AMBULATORY_CARE_PROVIDER_SITE_OTHER): Payer: Medicare Other | Admitting: Internal Medicine

## 2017-07-23 ENCOUNTER — Encounter: Payer: Self-pay | Admitting: Internal Medicine

## 2017-07-23 VITALS — BP 142/76 | HR 62 | Ht 63.5 in | Wt 151.0 lb

## 2017-07-23 DIAGNOSIS — I472 Ventricular tachycardia, unspecified: Secondary | ICD-10-CM

## 2017-07-23 DIAGNOSIS — Z95810 Presence of automatic (implantable) cardiac defibrillator: Secondary | ICD-10-CM

## 2017-07-23 DIAGNOSIS — I428 Other cardiomyopathies: Secondary | ICD-10-CM

## 2017-07-23 LAB — CUP PACEART INCLINIC DEVICE CHECK
HighPow Impedance: 67.5 Ohm
Implantable Lead Location: 753859
Implantable Lead Model: 7122
Implantable Pulse Generator Implant Date: 20171221
Lead Channel Pacing Threshold Amplitude: 0.75 V
Lead Channel Pacing Threshold Amplitude: 0.75 V
Lead Channel Pacing Threshold Pulse Width: 0.5 ms
Lead Channel Sensing Intrinsic Amplitude: 4.6 mV
Lead Channel Setting Pacing Amplitude: 2.5 V
Lead Channel Setting Pacing Pulse Width: 0.5 ms
MDC IDC LEAD IMPLANT DT: 20170601
MDC IDC LEAD IMPLANT DT: 20170601
MDC IDC LEAD LOCATION: 753860
MDC IDC MSMT BATTERY REMAINING LONGEVITY: 79 mo
MDC IDC MSMT LEADCHNL RA IMPEDANCE VALUE: 512.5 Ohm
MDC IDC MSMT LEADCHNL RA PACING THRESHOLD AMPLITUDE: 0.75 V
MDC IDC MSMT LEADCHNL RA PACING THRESHOLD PULSEWIDTH: 0.5 ms
MDC IDC MSMT LEADCHNL RA PACING THRESHOLD PULSEWIDTH: 0.5 ms
MDC IDC MSMT LEADCHNL RV IMPEDANCE VALUE: 412.5 Ohm
MDC IDC MSMT LEADCHNL RV PACING THRESHOLD AMPLITUDE: 0.75 V
MDC IDC MSMT LEADCHNL RV PACING THRESHOLD PULSEWIDTH: 0.5 ms
MDC IDC MSMT LEADCHNL RV SENSING INTR AMPL: 3 mV
MDC IDC SESS DTM: 20190212131740
MDC IDC SET LEADCHNL RA PACING AMPLITUDE: 2 V
MDC IDC SET LEADCHNL RV SENSING SENSITIVITY: 0.5 mV
MDC IDC STAT BRADY RA PERCENT PACED: 85 %
MDC IDC STAT BRADY RV PERCENT PACED: 2.8 %
Pulse Gen Serial Number: 7390376

## 2017-07-23 NOTE — Progress Notes (Signed)
HPI Mr. Bunch returns today for followup. He is a pleasant 83 yo man with CAD, peripheral vascular disease, HTN, and VT, s/p ICD insertion. In the interim he has done well. He is playing golf on days that it is warm. He denies chest pain or sob. No syncope or ICD shock. No Known Allergies   Current Outpatient Medications  Medication Sig Dispense Refill  . amiodarone (PACERONE) 200 MG tablet Take 1 tablet (200 mg total) by mouth daily. PLEASE KEEP 04/08/17 APPT FOR FUTURE REFILL AUTHORIZATION 90 tablet 0  . amLODipine (NORVASC) 10 MG tablet TAKE 1 TABLET BY MOUTH  EVERY DAY 90 tablet 3  . aspirin 81 MG EC tablet Take 1 tablet (81 mg total) by mouth daily.    [name] atorvastatin (LIPITOR) 80 MG tablet TAKE 1 TABLET BY MOUTH  DAILY 90 tablet 3  . cilostazol (PLETAL) 100 MG tablet TAKE 1 TABLET BY MOUTH  TWICE A DAY 180 tablet 3  . clopidogrel (PLAVIX) 75 MG tablet Take 1 tablet (75 mg total) by mouth daily. PLEASE KEEP 04/08/17 APPT FOR FUTURE REFILLS 90 tablet 0  . ezetimibe (ZETIA) 10 MG tablet Take 1 tablet (10 mg total) by mouth daily. 90 tablet 3  . fish oil-omega-3 fatty acids 1000 MG capsule Take 1 g by mouth 2 (two) times daily.     [name] levothyroxine (SYNTHROID, LEVOTHROID) 50 MCG tablet Take 50 mcg by mouth daily before breakfast.    . lisinopril (PRINIVIL,ZESTRIL) 10 MG tablet TAKE 1 TABLET BY MOUTH  DAILY 90 tablet 2  . Multiple Vitamins-Minerals (MULTIVITAMIN WITH MINERALS) tablet Take 1 tablet by mouth daily.    . vitamin E 400 UNIT capsule Take 2 capsules by mouth daily.     Current Facility-Administered Medications  Medication Dose Route Frequency Provider Last Rate Last Dose  . 0.9 %  sodium chloride infusion  500 mL Intravenous Continuous Pyrtle, Lajuan Lines, MD         Past Medical History:  Diagnosis Date  . Atrial fibrillation ([place])   . Bradycardia   . CAD S/P percutaneous coronary angioplasty 1993   POBA to Cx x 2 occasions: 10/2015:Mod (mod-severe CAD): p-mCx 50%.  mLAD ~40%, mRCA ~40%. dRCA ~70%, No culprit lesion for VT.  [name] Dyslipidemia, goal LDL below 70   . Former heavy tobacco smoker     quit in October 2014  . GERD (gastroesophageal reflux disease)   . Hypertension   . Olecranon bursitis of left elbow   . Osteoarthritis   . Peripheral vascular disease ([place])     H/O R SFA stent, L Iliac Stent 1996;;  Lower Extremity Angiography April 2015: Occluded left SFA reconstituting at end of the canal, heavily calcified. Three-vessel runoff..;; RCA 50% ostial right common iliac, patent SFA stent -> Referred to Dr. Trula Slade of [facility]  . S/P AVR (aortic valve replacement) 02/10/2013   23 [address] Magna Ease Pericardial Tissue Valve  . Severe aortic stenosis by prior echocardiogram    per Dr. Rollene Fare 2012; referred for aortic valve replacement  . Shortness of breath    Chronic  . Trigger ring finger of left hand   . Tubular adenoma of colon   . VT (ventricular tachycardia) ([place])    hemodynamically significant VT on 11/07/2015, nonobstructive CAD, no culprit lesion, underwent St Jude Dual Chamber ICD by Dr. [name]    ROS:   All systems reviewed and negative except as noted in the HPI.   Past Surgical  History:  Procedure Laterality Date  . ABDOMINAL ANGIOGRAM  09/10/2013   Procedure: ABDOMINAL ANGIOGRAM;  Surgeon: [name], MD;  Location: [facility] CATH LAB;  Service: Cardiovascular;;  . AORTIC VALVE REPLACEMENT  02/10/2013   Dr. Servando Snare: [address] Ease Pericardia  . AORTIC VALVE REPLACEMENT N/A 02/10/2013   AORTIC VALVE REPLACEMENT (AVR);  Surgeon: Grace Isaac, MD;  Location: [place];  Service: Open Heart Surgery;  Laterality: N/A  . ARTERIAL DOPPLER - PRE-CABG  02/06/2013   Bilateral 1-39% ICA stenosis  . CARDIAC CATHETERIZATION  August 2014   Nonobstructive 30-50% RCA. No significant LAD disease. 30% mid circumflex.  [name] CARDIAC CATHETERIZATION  11/10/2009   No intervention - recommend medical management  . CARDIAC CATHETERIZATION N/A  11/09/2015   Procedure: Left Heart Cath and Coronary Angiography;  Surgeon: Belva Crome, MD;  Location: [facility] INVASIVE CV LAB: Mod (mod-severe CAD): p-mCx 50%. mLAD ~40%, mRCA ~40%. dRCA ~70%, No culprit lesion for VT.  [name] CARDIOVASCULAR STRESS TEST  06/24/2012   Diffuse LV hypokinesis with moderately depressed systolic function and EF 14%,  . CORONARY ANGIOPLASTY  1993   Circumflex x 2   . EP IMPLANTABLE DEVICE N/A 11/10/2015   Procedure: ICD Implant;  Surgeon: Evans Lance, MD;  Location: [place] CV LAB;  Service: Cardiovascular;  Laterality: N/A;  . ICD GENERATOR CHANGEOUT N/A 05/31/2016   Procedure: ICD Generator Changeout;  Surgeon: Thompson Grayer, MD;  Location: [place] CV LAB;  Service: Cardiovascular;  Laterality: N/A;  . ILIAC ARTERY STENT Left 1996  . INTRAOPERATIVE TRANSESOPHAGEAL ECHOCARDIOGRAM N/A 02/10/2013   Procedure: INTRAOPERATIVE TRANSESOPHAGEAL ECHOCARDIOGRAM;  Surgeon: Grace Isaac, MD;  Location: [place];  Service: Open Heart Surgery;  Laterality: N/A;  . LEFT AND RIGHT HEART CATHETERIZATION WITH CORONARY ANGIOGRAM N/A 01/28/2013   Procedure: LEFT AND RIGHT HEART CATHETERIZATION WITH CORONARY ANGIOGRAM;  Surgeon: Leonie Man, MD;  Location[facility] CATH LAB;  Service: Cardiovascular;  Laterality: N/A;  . LOWER EXTREMITY ANGIOGRAM N/A 09/10/2013   Procedure: LOWER EXTREMITY ANGIOGRAM;  Surgeon: [name], MD;  Location: [facility] CATH LAB;  Service: Cardiovascular;  Laterality: N/A;  . OLECRANON BURSECTOMY  07/23/2011   Procedure: OLECRANON BURSA;  Surgeon: Lorn Junes, MD;  Location: [place];  Service: Orthopedics;  Laterality: Left;  excision olecranon bursa left elbow patient had supraclaviular block in preop  . PERIPHERAL VASCULAR ANGIOGRAM  07/19/1994   No intervention-recommend medical therapy  . PERIPHERAL VASCULAR ANGIOGRAM  01/17/1995   Right SFA 80-90# lesion, dilatation performed with a Cordis "Optz 5" 45mm x 2cm, inflated a 4-7.5atm for 40 to 70  seconds. Completion angiogram done by hand with "bolus chase" runoff from Primal SFA to foot. [place]utling in reduction of 80-90% to 20% with excellent flow and without dissection  . PERIPHERAL VASCULAR ANGIOGRAM  09/10/2013   Occluded left SFA, heavily calcified with reconstitution at adductor canal. Three-vessel runof.;; Right lower extremity noted 50% ostial right common iliac stenosis with roughly 20 mm gradient. Not considered significant.f  . right hand surgery    . ROTATOR CUFF REPAIR Right   . Superficial Femoral Atery stent Right 1996  . TONSILLECTOMY     as child  . TRANSTHORACIC ECHOCARDIOGRAM  03/23/2013   Post-Op Echo #1: EF 50-55%, Mild Conc LVH, Mild HK of Inferolateral wall with paradoxical septal motion (c/w post-op state); Well seated 23 mm Bioprosthetic Valve (area ~1.23 cm2 - normal for valve); Mild MR  . TRANSTHORACIC ECHOCARDIOGRAM  March  2017   EF 50-55%. No RWMA.  GR 1 DD.  23 mm bioprosthetic valve functioning normally.  Mild biatrial dilation.  [name] TRIGGER FINGER RELEASE  07/23/2011   Procedure: RELEASE TRIGGER FINGER/A-1 PULLEY;  Surgeon: Lorn Junes, MD;  Location: [place];  Service: Orthopedics;  Laterality: Left;  release trigger left ring finger patient had supraclavicular block in preop     Family History  Problem Relation Age of Onset  . Hypertension Mother   . Cancer Mother        Oral  . Stroke Father      Social History   Socioeconomic History  . Marital status: Married    Spouse name: Not on file  . Number of children: Not on file  . Years of education: Not on file  . Highest education level: Not on file  Social Needs  . Financial resource strain: Not on file  . Food insecurity - worry: Not on file  . Food insecurity - inability: Not on file  . Transportation needs - medical: Not on file  . Transportation needs - non-medical: Not on file  Occupational History  . Not on file  Tobacco Use  . Smoking status: Former Smoker     Packs/day: 0.50    Years: 60.00    Pack years: 30.00    Types: Cigarettes    Last attempt to quit: 01/31/2013    Years since quitting: 4.4  . Smokeless tobacco: Never Used  Substance and Sexual Activity  . Alcohol use: No    Alcohol/week: 0.0 oz  . Drug use: No  . Sexual activity: Not on file  Other Topics Concern  . Not on file  Social History Narrative   Married with no children. He long-term smoker who quit prior to his aVR in 2014.    Up until his operation he worked part-time at [employer].  He is not yet gone back to work postoperatively.   He chose not be cardiac rehabilitation, but has gone back to walking exercising daily.     BP (!) 142/76   Pulse 62   Ht 5' 3.5" (1.613 m)   Wt 151 lb (68.5 kg)   BMI 26.33 kg/m   Physical Exam:  Well appearing 83 yo man, NAD HEENT: Unremarkable Neck:  No JVD, no thyromegally Lymphatics:  No adenopathy Back:  No CVA tenderness Lungs:  Clear with no wheezes HEART:  Regular rate rhythm, no murmurs, no rubs, no clicks Abd:  soft, positive bowel sounds, no organomegally, no rebound, no guarding Ext:  2 plus pulses, no edema, no cyanosis, no clubbing Skin:  No rashes no nodules Neuro:  CN II through XII intact, motor grossly intact  EKG - nsr  DEVICE  Normal device function.  See PaceArt for details.   Assess/Plan: 1.  VT - he has had no symptomatic arrhythmias since his last visit. He will continue his current meds. 2. Chronic systolic heart failure - his symptoms remain class 2. He will continue his current meds. 3. ICD - his St. Jude device is working normally. Will recheck in several months.  Mikle Bosworth.D.

## 2017-07-23 NOTE — Patient Instructions (Signed)
Medication Instructions:  Your physician recommends that you continue on your current medications as directed. Please refer to the Current Medication list given to you today.  Labwork: None ordered.  Testing/Procedures: None ordered.  Follow-Up: Your physician wants you to follow-up in: one year with Dr. [name].   You will receive a reminder letter in the mail two months in advance. If you don't receive a letter, please call our office to schedule the follow-up appointment.  Remote monitoring is used to monitor your ICD from home. This monitoring reduces the number of office visits required to check your device to one time per year. It allows [place] to keep an eye on the functioning of your device to ensure it is working properly. You are scheduled for a device check from home on 09/23/2017. You may send your transmission at any time that day. If you have a wireless device, the transmission will be sent automatically. After your physician reviews your transmission, you will receive a postcard with your next transmission date.  Any Other Special Instructions Will Be Listed Below (If Applicable).  If you need a refill on your cardiac medications before your next appointment, please call your pharmacy.

## 2017-07-29 ENCOUNTER — Other Ambulatory Visit: Payer: Self-pay | Admitting: Cardiology

## 2017-07-29 DIAGNOSIS — I739 Peripheral vascular disease, unspecified: Secondary | ICD-10-CM

## 2017-08-09 HISTORY — PX: TRANSTHORACIC ECHOCARDIOGRAM: SHX275

## 2017-08-21 ENCOUNTER — Ambulatory Visit (HOSPITAL_COMMUNITY): Payer: Medicare Other | Attending: Cardiology

## 2017-08-21 ENCOUNTER — Other Ambulatory Visit: Payer: Self-pay

## 2017-08-21 DIAGNOSIS — Z9861 Coronary angioplasty status: Secondary | ICD-10-CM | POA: Diagnosis not present

## 2017-08-21 DIAGNOSIS — Z952 Presence of prosthetic heart valve: Secondary | ICD-10-CM | POA: Diagnosis present

## 2017-08-21 DIAGNOSIS — I472 Ventricular tachycardia, unspecified: Secondary | ICD-10-CM

## 2017-08-21 DIAGNOSIS — I4891 Unspecified atrial fibrillation: Secondary | ICD-10-CM | POA: Insufficient documentation

## 2017-08-21 DIAGNOSIS — I2510 Atherosclerotic heart disease of native coronary artery without angina pectoris: Secondary | ICD-10-CM

## 2017-08-21 DIAGNOSIS — I480 Paroxysmal atrial fibrillation: Secondary | ICD-10-CM

## 2017-08-21 DIAGNOSIS — E785 Hyperlipidemia, unspecified: Secondary | ICD-10-CM | POA: Insufficient documentation

## 2017-08-21 DIAGNOSIS — Z87891 Personal history of nicotine dependence: Secondary | ICD-10-CM | POA: Diagnosis not present

## 2017-08-21 DIAGNOSIS — I119 Hypertensive heart disease without heart failure: Secondary | ICD-10-CM | POA: Diagnosis not present

## 2017-08-23 ENCOUNTER — Other Ambulatory Visit: Payer: Self-pay | Admitting: Cardiology

## 2017-08-23 ENCOUNTER — Other Ambulatory Visit: Payer: Self-pay | Admitting: Physician Assistant

## 2017-08-23 NOTE — Telephone Encounter (Signed)
REFILL 

## 2017-08-26 ENCOUNTER — Telehealth: Payer: Self-pay | Admitting: *Deleted

## 2017-08-26 NOTE — Telephone Encounter (Signed)
Left message to call back - with echo report

## 2017-08-26 NOTE — Telephone Encounter (Signed)
-----   Message from Leonie Man, MD sent at 08/22/2017  6:05 PM EDT ----- Echocardiogram results: Normal LV size with moderate LVH.  Ejection fraction is on the low range of normal 50-55%.  Grade 1 diastolic dysfunction.  The aortic valve prosthesis is present and functioning well -slightly higher than what would be expected but not to the point of stenosis.  Otherwise the echo looks good.    Please forward to PCP:   [name], MD

## 2017-08-26 NOTE — Telephone Encounter (Signed)
Spoke to patient. Result given . Verbalized understanding  

## 2017-09-04 ENCOUNTER — Ambulatory Visit (HOSPITAL_BASED_OUTPATIENT_CLINIC_OR_DEPARTMENT_OTHER)
Admission: RE | Admit: 2017-09-04 | Discharge: 2017-09-04 | Disposition: A | Discharge status: Home / Self Care | Payer: Medicare Other | Source: Ambulatory Visit | Attending: Cardiology | Admitting: Cardiology

## 2017-09-04 ENCOUNTER — Ambulatory Visit (HOSPITAL_COMMUNITY)
Admission: RE | Admit: 2017-09-04 | Discharge: 2017-09-04 | Disposition: A | Discharge status: Home / Self Care | Payer: Medicare Other | Source: Ambulatory Visit | Attending: Cardiology | Admitting: Cardiology

## 2017-09-04 DIAGNOSIS — I2510 Atherosclerotic heart disease of native coronary artery without angina pectoris: Secondary | ICD-10-CM | POA: Insufficient documentation

## 2017-09-04 DIAGNOSIS — I739 Peripheral vascular disease, unspecified: Secondary | ICD-10-CM

## 2017-09-04 DIAGNOSIS — I70201 Unspecified atherosclerosis of native arteries of extremities, right leg: Secondary | ICD-10-CM | POA: Diagnosis not present

## 2017-09-04 DIAGNOSIS — R9389 Abnormal findings on diagnostic imaging of other specified body structures: Secondary | ICD-10-CM | POA: Insufficient documentation

## 2017-09-04 DIAGNOSIS — I480 Paroxysmal atrial fibrillation: Secondary | ICD-10-CM | POA: Diagnosis not present

## 2017-09-04 DIAGNOSIS — Z87891 Personal history of nicotine dependence: Secondary | ICD-10-CM | POA: Diagnosis not present

## 2017-09-04 DIAGNOSIS — I6523 Occlusion and stenosis of bilateral carotid arteries: Secondary | ICD-10-CM | POA: Insufficient documentation

## 2017-09-04 DIAGNOSIS — E785 Hyperlipidemia, unspecified: Secondary | ICD-10-CM | POA: Insufficient documentation

## 2017-09-04 DIAGNOSIS — I10 Essential (primary) hypertension: Secondary | ICD-10-CM | POA: Diagnosis not present

## 2017-09-04 DIAGNOSIS — R0989 Other specified symptoms and signs involving the circulatory and respiratory systems: Secondary | ICD-10-CM | POA: Insufficient documentation

## 2017-09-23 ENCOUNTER — Ambulatory Visit (INDEPENDENT_AMBULATORY_CARE_PROVIDER_SITE_OTHER): Payer: Medicare Other | Admitting: *Deleted

## 2017-09-23 DIAGNOSIS — I472 Ventricular tachycardia, unspecified: Secondary | ICD-10-CM

## 2017-09-23 NOTE — Progress Notes (Signed)
Remote ICD transmission.   

## 2017-09-25 ENCOUNTER — Encounter: Payer: Self-pay | Admitting: Cardiology

## 2017-09-30 LAB — CUP PACEART REMOTE DEVICE CHECK
Battery Remaining Percentage: 85 %
Battery Voltage: 3.01 V
Brady Statistic AS VP Percent: 1 %
Brady Statistic RA Percent Paced: 85 %
HighPow Impedance: 73 Ohm
HighPow Impedance: 73 Ohm
Implantable Lead Implant Date: 20170601
Implantable Lead Location: 753859
Implantable Lead Model: 7122
Implantable Pulse Generator Implant Date: 20171221
Lead Channel Impedance Value: 430 Ohm
Lead Channel Impedance Value: 510 Ohm
Lead Channel Pacing Threshold Amplitude: 0.75 V
Lead Channel Pacing Threshold Pulse Width: 0.5 ms
Lead Channel Sensing Intrinsic Amplitude: 2.7 mV
Lead Channel Setting Pacing Amplitude: 2 V
Lead Channel Setting Pacing Pulse Width: 0.5 ms
MDC IDC LEAD IMPLANT DT: 20170601
MDC IDC LEAD LOCATION: 753860
MDC IDC MSMT BATTERY REMAINING LONGEVITY: 75 mo
MDC IDC MSMT LEADCHNL RA PACING THRESHOLD AMPLITUDE: 0.75 V
MDC IDC MSMT LEADCHNL RA SENSING INTR AMPL: 3.8 mV
MDC IDC MSMT LEADCHNL RV PACING THRESHOLD PULSEWIDTH: 0.5 ms
MDC IDC SESS DTM: 20190415124954
MDC IDC SET LEADCHNL RV PACING AMPLITUDE: 2.5 V
MDC IDC SET LEADCHNL RV SENSING SENSITIVITY: 0.5 mV
MDC IDC STAT BRADY AP VP PERCENT: 2.8 %
MDC IDC STAT BRADY AP VS PERCENT: 83 %
MDC IDC STAT BRADY AS VS PERCENT: 14 %
MDC IDC STAT BRADY RV PERCENT PACED: 2.8 %
Pulse Gen Serial Number: 7390376

## 2017-10-07 ENCOUNTER — Ambulatory Visit (INDEPENDENT_AMBULATORY_CARE_PROVIDER_SITE_OTHER): Payer: Medicare Other | Admitting: Cardiology

## 2017-10-07 ENCOUNTER — Encounter: Payer: Self-pay | Admitting: Cardiology

## 2017-10-07 VITALS — BP 144/72 | HR 64 | Ht 63.5 in | Wt 150.0 lb

## 2017-10-07 DIAGNOSIS — I472 Ventricular tachycardia, unspecified: Secondary | ICD-10-CM

## 2017-10-07 DIAGNOSIS — Z952 Presence of prosthetic heart valve: Secondary | ICD-10-CM

## 2017-10-07 DIAGNOSIS — I10 Essential (primary) hypertension: Secondary | ICD-10-CM | POA: Diagnosis not present

## 2017-10-07 DIAGNOSIS — E785 Hyperlipidemia, unspecified: Secondary | ICD-10-CM | POA: Diagnosis not present

## 2017-10-07 DIAGNOSIS — I739 Peripheral vascular disease, unspecified: Secondary | ICD-10-CM | POA: Diagnosis not present

## 2017-10-07 DIAGNOSIS — R0609 Other forms of dyspnea: Secondary | ICD-10-CM | POA: Diagnosis not present

## 2017-10-07 DIAGNOSIS — R42 Dizziness and giddiness: Secondary | ICD-10-CM | POA: Diagnosis not present

## 2017-10-07 DIAGNOSIS — I2510 Atherosclerotic heart disease of native coronary artery without angina pectoris: Secondary | ICD-10-CM | POA: Diagnosis not present

## 2017-10-07 DIAGNOSIS — Z9861 Coronary angioplasty status: Secondary | ICD-10-CM

## 2017-10-07 NOTE — Progress Notes (Signed)
PCP: [name], MD  Clinic Note: Chief Complaint  Patient presents with  . Follow-up    denies chest pains, swelling in hands/feet. pt states only having SOB when overdoing himself  . Coronary Artery Disease    No active angina  . PAD    Claudication noted    HPI: Ralph Drake is a 83 y.o. male with a PMH below who presents today for Six-month follow-up for CAD/PAD & s/p AVR as well as status post ICD for sustained V. tach.  CAD (coronary angioplasty x 2 1993),   PAD (SFA stent 1996, SFA occlusion 2015) ,   H/o AS: s/p AVR (23 Edwards Magna Ease Bovine Pericardial Tissue Valve 2014) with Cardiomyopathy (mixed Valvular & Ischemic).   Recurrent VT -- s/p ICD - multiple shocks -- > ICD Implant 11/10/2015 (Dr. [name]) - St. Jude ICD L Ax. V.; readmitted for frequent VT - new ICD  re-implant for device failure 05/2016. (Dr. [name])  He has known solitary kidney - CKD III along with HTN, HLD.   Ralph Drake was last seen in Oct 2018 -he was doing well at that time.  He was also just seen by Dr. Crissie Sickles in February 2019.  Doing well.  Noting that he is playing golf on warm days.  No syncope no ICD shocks.  Recent Hospitalizations: None  Studies Personally Reviewed - (if available, images/films reviewed: From Epic Chart or Care Everywhere)  2D Echo August 22, 2017: EF 50 and 55% with moderate LVH.  No regional wall motion normalities.  GR 1 DD.  Bioprosthetic aortic valve present -normal gradients.  (September 04, 2017) ABIs: (Right 0.74, left 0.70) right ABIs decreased compared to 2014 but left is stable. --Indicates moderate bilateral lower extremity arterial disease. -->  Duplex: Hard plaque throughout. ->  Abdominal aortic duplex was recommended.  Interval History: Ralph Drake presents today overall doing fairly well.   He does note that he still has claudication that limits his walking.  He can walk about 100 feet before having to stop.  But once he rests for a few seconds he  is able to go back to walking.  He ends up walking over a mile at a time.  He may note some dizziness when he gets up in the middle of the night to go to the bathroom, but  otherwise no real dizziness or lightheadedness.   With the amount of walking is able to do, he denies any anginal or dyspnea symptoms with rest or exertion --only indicates getting short of breath if he "overdoes it.  This is as much because his legs hurt than anything else.   No PND, orthopnea with minimal end of day edema.  No syncope/near syncope or TIA/amaurosis fugax  + claudication - bilateral, stops several times when walking 1.5 miles.  Not @ rest or regular activity.  ROS: A comprehensive was performed. Review of Systems  Constitutional: Negative for malaise/fatigue and weight loss.  HENT: Negative for congestion.   Eyes: Negative.   Respiratory: Positive for shortness of breath (Baseline). Negative for cough and sputum production.   Cardiovascular: Negative.   Gastrointestinal: Negative for heartburn and nausea.  Genitourinary: Negative for dysuria and flank pain.  Musculoskeletal: Positive for joint pain (Normal arthritis pains.). Negative for back pain and myalgias.  Neurological: Positive for dizziness (Occasional orthostatic dizziness at night - ).  Endo/Heme/Allergies: Negative for environmental allergies.  Psychiatric/Behavioral: Positive for memory loss.  All other systems reviewed and are negative.  I have reviewed and (if needed) personally updated the patient's problem list, medications, allergies, past medical and surgical history, social and family history.   Past Medical History:  Diagnosis Date  . Atrial fibrillation ([place])   . Bradycardia   . CAD S/P percutaneous coronary angioplasty 1993   POBA to Cx x 2 occasions: 10/2015:Mod (mod-severe CAD): p-mCx 50%. mLAD ~40%, mRCA ~40%. dRCA ~70%, No culprit lesion for VT.  [name] Dyslipidemia, goal LDL below 70   . Former heavy tobacco smoker     quit  in October 2014  . GERD (gastroesophageal reflux disease)   . Hypertension   . Olecranon bursitis of left elbow   . Osteoarthritis   . Peripheral vascular disease ([place])     H/O R SFA stent, L Iliac Stent 1996;;  Lower Extremity Angiography April 2015: Occluded left SFA reconstituting at end of the canal, heavily calcified. Three-vessel runoff..;; RCA 50% ostial right common iliac, patent SFA stent -> Referred to Dr. Trula Slade of [facility]  . S/P AVR (aortic valve replacement) 02/10/2013   23 Edwards Magna Ease Pericardial Tissue Valve  . Severe aortic stenosis by prior echocardiogram    per Dr. Rollene Fare 2012; referred for aortic valve replacement  . Shortness of breath    Chronic  . Trigger ring finger of left hand   . Tubular adenoma of colon   . VT (ventricular tachycardia) ([place])    hemodynamically significant VT on 11/07/2015, nonobstructive CAD, no culprit lesion, underwent St Jude Dual Chamber ICD by Dr. [name]    Past Surgical History:  Procedure Laterality Date  . ABDOMINAL ANGIOGRAM  09/10/2013   Procedure: ABDOMINAL ANGIOGRAM;  Surgeon: [name], MD;  Location: [facility] CATH LAB;  Service: Cardiovascular;;  . AORTIC VALVE REPLACEMENT N/A 02/10/2013   AORTIC VALVE REPLACEMENT (AVR);  Surgeon: Grace Isaac, MD;  Location: [facility] OR;  Service: Open Heart Surgery:  Dr. Servando Snare: [address] Ease Pericardia  . ARTERIAL DOPPLER - PRE-CABG  02/06/2013   Bilateral 1-39% ICA stenosis  . CARDIAC CATHETERIZATION  August 2014   Nonobstructive 30-50% RCA. No significant LAD disease. 30% mid circumflex.  [name] CARDIAC CATHETERIZATION  11/10/2009   No intervention - recommend medical management  . CARDIAC CATHETERIZATION N/A 11/09/2015   Procedure: Left Heart Cath and Coronary Angiography;  Surgeon: Belva Crome, MD;  Location: [facility] INVASIVE CV LAB: Mod (mod-severe CAD): p-mCx 50%. mLAD ~40%, mRCA ~40%. dRCA ~70%, No culprit lesion for VT.  [name] CARDIOVASCULAR STRESS TEST  06/24/2012   Diffuse LV  hypokinesis with moderately depressed systolic function and EF 70%,  . CORONARY ANGIOPLASTY  1993   Circumflex x 2   . EP IMPLANTABLE DEVICE N/A 11/10/2015   Procedure: ICD Implant;  Surgeon: Evans Lance, MD;  Location: [place] CV LAB;  Service: Cardiovascular;  Laterality: N/A;  . ICD GENERATOR CHANGEOUT N/A 05/31/2016   Procedure: ICD Generator Changeout;  Surgeon: Thompson Grayer, MD;  Location: [facility] CV LAB;  Service: Cardiovascular;  Laterality: N/A;  . ILIAC ARTERY STENT Left 1996  . INTRAOPERATIVE TRANSESOPHAGEAL ECHOCARDIOGRAM N/A 02/10/2013   Procedure: INTRAOPERATIVE TRANSESOPHAGEAL ECHOCARDIOGRAM;  Surgeon: Grace Isaac, MD;  Location: [place];  Service: Open Heart Surgery;  Laterality: N/A;  . LEFT AND RIGHT HEART CATHETERIZATION WITH CORONARY ANGIOGRAM N/A 01/28/2013   Procedure: LEFT AND RIGHT HEART CATHETERIZATION WITH CORONARY ANGIOGRAM;  Surgeon: Leonie Man, MD;  Location: [facility] CATH LAB;  Service: Cardiovascular;  Laterality: N/A;  . LOWER EXTREMITY ANGIOGRAM  N/A 09/10/2013   Procedure: LOWER EXTREMITY ANGIOGRAM;  Surgeon: [name], MD;  Location: [facility] CATH LAB;  Service: Cardiovascular;  Laterality: N/A;  . OLECRANON BURSECTOMY  07/23/2011   Procedure: OLECRANON BURSA;  Surgeon: Lorn Junes, MD;  Location: [facility];  Service: Orthopedics;  Laterality: Left;  excision olecranon bursa left elbow patient had supraclaviular block in preop  . PERIPHERAL VASCULAR ANGIOGRAM  07/19/1994   No intervention-recommend medical therapy  . PERIPHERAL VASCULAR ANGIOGRAM  01/17/1995   Right SFA 80-90# lesion, dilatation performed with a Cordis "Optz 5" 39mm x 2cm, inflated a 4-7.5atm for 40 to 70 seconds. Completion angiogram done by hand with "bolus chase" runoff from Primal SFA to foot. [place] in reduction of 80-90% to 20% with excellent flow and without dissection  . PERIPHERAL VASCULAR ANGIOGRAM  09/10/2013   Occluded left SFA, heavily calcified with  reconstitution at adductor canal. Three-vessel runof.;; Right lower extremity noted 50% ostial right common iliac stenosis with roughly 20 mm gradient. Not considered significant.f  . right hand surgery    . ROTATOR CUFF REPAIR Right   . Superficial Femoral Atery stent Right 1996  . TONSILLECTOMY     as child  . TRANSTHORACIC ECHOCARDIOGRAM  03/23/2013   Post-Op Echo #1: EF 50-55%, Mild Conc LVH, Mild HK of Inferolateral wall with paradoxical septal motion (c/w post-op state); Well seated 23 mm Bioprosthetic Valve (area ~1.23 cm2 - normal for valve); Mild MR  . TRANSTHORACIC ECHOCARDIOGRAM  March 2017   EF 50-55%. No RWMA.  GR 1 DD.  23 mm bioprosthetic valve functioning normally.  Mild biatrial dilation.  . TRANSTHORACIC ECHOCARDIOGRAM  08/2017   EF 50 and 55% with moderate LVH.  No regional wall motion normalities.  GR 1 DD.  Bioprosthetic aortic valve present -normal gradients.  [name] TRIGGER FINGER RELEASE  07/23/2011   Procedure: RELEASE TRIGGER FINGER/A-1 PULLEY;  Surgeon: Lorn Junes, MD;  Location: [facility];  Service: Orthopedics;  Laterality: Left;  release trigger left ring finger patient had supraclavicular block in preop    Current Meds  Medication Sig  . amiodarone (PACERONE) 200 MG tablet TAKE 1 TABLET BY MOUTH  DAILY  . amLODipine (NORVASC) 10 MG tablet TAKE 1 TABLET BY MOUTH  EVERY DAY  . aspirin 81 MG EC tablet Take 1 tablet (81 mg total) by mouth daily.  [name] atorvastatin (LIPITOR) 80 MG tablet TAKE 1 TABLET BY MOUTH  DAILY  . cilostazol (PLETAL) 100 MG tablet TAKE 1 TABLET BY MOUTH  TWICE A DAY  . clopidogrel (PLAVIX) 75 MG tablet TAKE 1 TABLET BY MOUTH  DAILY  . ezetimibe (ZETIA) 10 MG tablet TAKE 1 TABLET BY MOUTH  DAILY  . fish oil-omega-3 fatty acids 1000 MG capsule Take 1 g by mouth 2 (two) times daily.   [name] levothyroxine (SYNTHROID, LEVOTHROID) 50 MCG tablet Take 50 mcg by mouth daily before breakfast.  . lisinopril (PRINIVIL,ZESTRIL) 10 MG tablet TAKE  1 TABLET BY MOUTH  DAILY  . Multiple Vitamins-Minerals (MULTIVITAMIN WITH MINERALS) tablet Take 1 tablet by mouth daily.  . vitamin E 400 UNIT capsule Take 2 capsules by mouth daily.   Current Facility-Administered Medications for the 10/07/17 encounter (Office Visit) with Leonie Man, MD  Medication  . 0.9 %  sodium chloride infusion    No Known Allergies  Social History   Tobacco Use  . Smoking status: Former Smoker    Packs/day: 0.50    Years: 60.00  Pack years: 30.00    Types: Cigarettes    Last attempt to quit: 01/31/2013    Years since quitting: 4.6  . Smokeless tobacco: Never Used  Substance Use Topics  . Alcohol use: No    Alcohol/week: 0.0 oz  . Drug use: No   Social History   Social History Narrative   Married with no children. He long-term smoker who quit prior to his aVR in 2014.    Up until his operation he worked part-time at [employer].  He is not yet gone back to work postoperatively.   He chose not be cardiac rehabilitation, but has gone back to walking exercising daily.    family history includes Cancer in his mother; Hypertension in his mother; Stroke in his father.  Wt Readings from Last 3 Encounters:  10/07/17 150 lb (68 kg)  07/23/17 151 lb (68.5 kg)  04/08/17 150 lb 6.4 oz (68.2 kg)    PHYSICAL EXAM BP (!) 144/72 (BP Location: Left Arm)   Pulse 64   Ht 5' 3.5" (1.613 m)   Wt 150 lb (68 kg)   BMI 26.15 kg/m  Physical Exam  Constitutional: He is oriented to person, place, and time. He appears well-developed and well-nourished. No distress.  Healthy appearing  HENT:  Head: Normocephalic and atraumatic.  Eyes: Pupils are equal, round, and reactive to light. EOM are normal. Left eye exhibits no discharge.  Neck: Normal range of motion. Neck supple. No hepatojugular reflux and no JVD present. Carotid bruit is present (R>L).  Cardiovascular: Normal rate and regular rhythm.  No extrasystoles are present. PMI is not displaced. Exam  reveals decreased pulses (Bilateral AT pulses decreased ). Exam reveals no gallop and no friction rub.  Murmur (Soft 1/6 SEM at RUSB.) heard. Pulmonary/Chest: Effort normal. No respiratory distress. He has no wheezes. He exhibits no tenderness.  Mild diffuse crackles - no rales or rhonchi  Abdominal: Soft. Bowel sounds are normal. He exhibits no distension. There is no tenderness. There is no rebound.  Musculoskeletal: Normal range of motion. He exhibits no edema.  Neurological: He is alert and oriented to person, place, and time.  Skin: Skin is warm and dry. No rash noted. No erythema.  Psychiatric: He has a normal mood and affect. His behavior is normal. Thought content normal.  Nursing note and vitals reviewed.    Adult ECG Report Checked  Other studies Reviewed: Additional studies/ records that were reviewed today include:  Recent Labs: 02/2017  Na+ 137, K+ 4.8, Cl- 107, HCO3- 24 , BUN 36, Cr 2.1, Glu 98, Ca2+ 10.4; AST 20, ALT, 16 AlkP 68 --> recheck BMP 10/24  Na+ 135, K+ 5.5*, Cl- 105, HCO3- 27 , BUN 26, Cr 1.7, Glu 125,   CBC: W 5.8, H/H 13.3/39.8, Plt 196; TSH 22.8, Free T4 0.6 (just started on Levothyroxine)  Lipids not checked   ASSESSMENT / PLAN: Problem List Items Addressed This Visit    Sustained ventricular tachycardia (HCC) (Chronic)    As far as I can tell, no recurrent VT episodes.  No shocks with his device. Remains on amiodarone -needs annual eye exams, TFTs and LFTs.  ICD being followed by EP.      S/P tissue AVR 02/11/13 (Chronic)    Stable echo.  No significant murmur to suggest worsening function.  Okay to follow-up in 2021.      Peripheral vascular disease - s/p L Iliac stent (Chronic)    He still has claudication but we are medically  managing his left SFA occlusion.  He is on Pletal and amlodipine. At his follow-up visit, we may consider converting from Plavix to low-dose Xarelto for long-term management of his CAD and PAD.  Otherwise continue risk  factor modification.  Need to monitor lipids. Simply follow annual ABI/TBI's and abdominal aortic ultrasounds..--> He never did have the aortic duplex ultrasound checked.  We will go ahead and order that based on lower extremity arterial duplex reader's recommendations.      Moderate claudication (HCC) - Primary (Chronic)    He has been followed by Dr. Trula Slade.  We are monitoring his ABIs most recently checked in March.  Checking abdominal aortic Dopplers. Continue Pletal and aspirin, statin etc.      Exertional dyspnea (Chronic)    Relatively stable.  No notable change overall.  Probably has some component of pulmonary disorder as well as diastolic dysfunction.      Essential hypertension (Chronic)    Borderline blood pressures today with lisinopril and amlodipine.  At this point I am leery of adding a new medication.  With him having some dizziness, we will simply maintain current dose.      Dyslipidemia, goal LDL below 70 (Chronic)    Unfortunately I have not seen labs in some time now.  He is on high-dose statin plus Zetia.  It would like to see his labs.      Dizziness    Nighttime dizziness.  Will have him take his ACE inhibitor dose at dinnertime as opposed to bedtime to avoid overnight hypotension.      CAD S/P PTCA of CFX X 2 in '93 (Chronic)    Distant history of PTCA with relatively moderate disease by preop cath in 2017 prior to ICD placement. Remains on aspirin Plavix and Pletal along with ACE inhibitor and calcium channel blocker.  He is not on a beta-blocker because he is on amiodarone. He is on high-dose atorvastatin plus Zetia.         Current medicines are reviewed at length with the patient today. (+/- concerns) n/a The following changes have been made: n/a  Patient Instructions  NO CHANGE WITH MEDICATIONS     Your physician wants you to follow-up in [place].You will receive a reminder letter in the mail two months in advance. If you  don't receive a letter, please call our office to schedule the follow-up appointment.   If you need a refill on your cardiac medications before your next appointment, please call your pharmacy.    Studies Ordered:   No orders of the defined types were placed in this encounter.     Glenetta Hew, M.D., M.S. Interventional Cardiologist   Pager # [phone] Phone # [phone] [address] [place], [place] [address]

## 2017-10-07 NOTE — Patient Instructions (Signed)
NO CHANGE WITH MEDICATIONS     Your physician wants you to follow-up in [place].You will receive a reminder letter in the mail two months in advance. If you don't receive a letter, please call our office to schedule the follow-up appointment.   If you need a refill on your cardiac medications before your next appointment, please call your pharmacy.

## 2017-10-08 ENCOUNTER — Encounter: Payer: Self-pay | Admitting: Cardiology

## 2017-10-08 NOTE — Assessment & Plan Note (Signed)
He has been followed by Dr. Trula Slade.  We are monitoring his ABIs most recently checked in March.  Checking abdominal aortic Dopplers. Continue Pletal and aspirin, statin etc.

## 2017-10-08 NOTE — Assessment & Plan Note (Signed)
Nighttime dizziness.  Will have him take his ACE inhibitor dose at dinnertime as opposed to bedtime to avoid overnight hypotension.

## 2017-10-08 NOTE — Assessment & Plan Note (Signed)
Relatively stable.  No notable change overall.  Probably has some component of pulmonary disorder as well as diastolic dysfunction.

## 2017-10-08 NOTE — Assessment & Plan Note (Signed)
Unfortunately I have not seen labs in some time now.  He is on high-dose statin plus Zetia.  It would like to see his labs.

## 2017-10-08 NOTE — Assessment & Plan Note (Signed)
Distant history of PTCA with relatively moderate disease by preop cath in 2017 prior to ICD placement. Remains on aspirin Plavix and Pletal along with ACE inhibitor and calcium channel blocker.  He is not on a beta-blocker because he is on amiodarone. He is on high-dose atorvastatin plus Zetia.

## 2017-10-08 NOTE — Assessment & Plan Note (Addendum)
He still has claudication but we are medically managing his left SFA occlusion.  He is on Pletal and amlodipine. At his follow-up visit, we may consider converting from Plavix to low-dose Xarelto for long-term management of his CAD and PAD.  Otherwise continue risk factor modification.  Need to monitor lipids. Simply follow annual ABI/TBI's and abdominal aortic ultrasounds..--> He never did have the aortic duplex ultrasound checked.  We will go ahead and order that based on lower extremity arterial duplex reader's recommendations.

## 2017-10-08 NOTE — Assessment & Plan Note (Signed)
Borderline blood pressures today with lisinopril and amlodipine.  At this point I am leery of adding a new medication.  With him having some dizziness, we will simply maintain current dose.

## 2017-10-08 NOTE — Assessment & Plan Note (Addendum)
As far as I can tell, no recurrent VT episodes.  No shocks with his device. Remains on amiodarone -needs annual eye exams, TFTs and LFTs.  ICD being followed by EP.

## 2017-10-08 NOTE — Assessment & Plan Note (Signed)
Stable echo.  No significant murmur to suggest worsening function.  Okay to follow-up in 2021.

## 2017-11-15 ENCOUNTER — Other Ambulatory Visit: Payer: Self-pay | Admitting: *Deleted

## 2017-11-15 DIAGNOSIS — I739 Peripheral vascular disease, unspecified: Secondary | ICD-10-CM

## 2017-11-27 ENCOUNTER — Ambulatory Visit (HOSPITAL_COMMUNITY)
Admission: RE | Admit: 2017-11-27 | Discharge: 2017-11-27 | Disposition: A | Discharge status: Home / Self Care | Payer: Medicare Other | Source: Ambulatory Visit | Attending: Cardiovascular Disease | Admitting: Cardiovascular Disease

## 2017-11-27 DIAGNOSIS — I739 Peripheral vascular disease, unspecified: Secondary | ICD-10-CM

## 2017-12-23 ENCOUNTER — Ambulatory Visit (INDEPENDENT_AMBULATORY_CARE_PROVIDER_SITE_OTHER): Payer: Medicare Other | Admitting: *Deleted

## 2017-12-23 DIAGNOSIS — I472 Ventricular tachycardia, unspecified: Secondary | ICD-10-CM

## 2017-12-23 NOTE — Progress Notes (Signed)
Remote ICD transmission.   

## 2017-12-24 LAB — CUP PACEART REMOTE DEVICE CHECK
Battery Remaining Longevity: 73 mo
Battery Remaining Percentage: 82 %
Brady Statistic AP VP Percent: 3 %
Brady Statistic AS VP Percent: 1 %
Brady Statistic AS VS Percent: 18 %
Brady Statistic RV Percent Paced: 3 %
HighPow Impedance: 69 Ohm
HighPow Impedance: 69 Ohm
Implantable Lead Implant Date: 20170601
Implantable Lead Location: 753860
Implantable Lead Model: 7122
Implantable Pulse Generator Implant Date: 20171221
Lead Channel Impedance Value: 400 Ohm
Lead Channel Impedance Value: 530 Ohm
Lead Channel Pacing Threshold Amplitude: 0.75 V
Lead Channel Pacing Threshold Pulse Width: 0.5 ms
Lead Channel Pacing Threshold Pulse Width: 0.5 ms
Lead Channel Sensing Intrinsic Amplitude: 3.1 mV
Lead Channel Sensing Intrinsic Amplitude: 4 mV
Lead Channel Setting Pacing Amplitude: 2.5 V
MDC IDC LEAD IMPLANT DT: 20170601
MDC IDC LEAD LOCATION: 753859
MDC IDC MSMT BATTERY VOLTAGE: 2.99 V
MDC IDC MSMT LEADCHNL RV PACING THRESHOLD AMPLITUDE: 0.75 V
MDC IDC PG SERIAL: 7390376
MDC IDC SESS DTM: 20190715060019
MDC IDC SET LEADCHNL RA PACING AMPLITUDE: 2 V
MDC IDC SET LEADCHNL RV PACING PULSEWIDTH: 0.5 ms
MDC IDC SET LEADCHNL RV SENSING SENSITIVITY: 0.5 mV
MDC IDC STAT BRADY AP VS PERCENT: 78 %
MDC IDC STAT BRADY RA PERCENT PACED: 79 %

## 2017-12-25 ENCOUNTER — Encounter: Payer: Self-pay | Admitting: Cardiology

## 2018-01-01 IMAGING — CR DG CHEST 1V PORT
1 series · 1 of 1 positions shown · non-contrast
Comparison: 03/12/2013

CLINICAL DATA: Arrhythemia tonight.  Shortness of breath.

EXAM:
PORTABLE CHEST 1 VIEW

[AP]
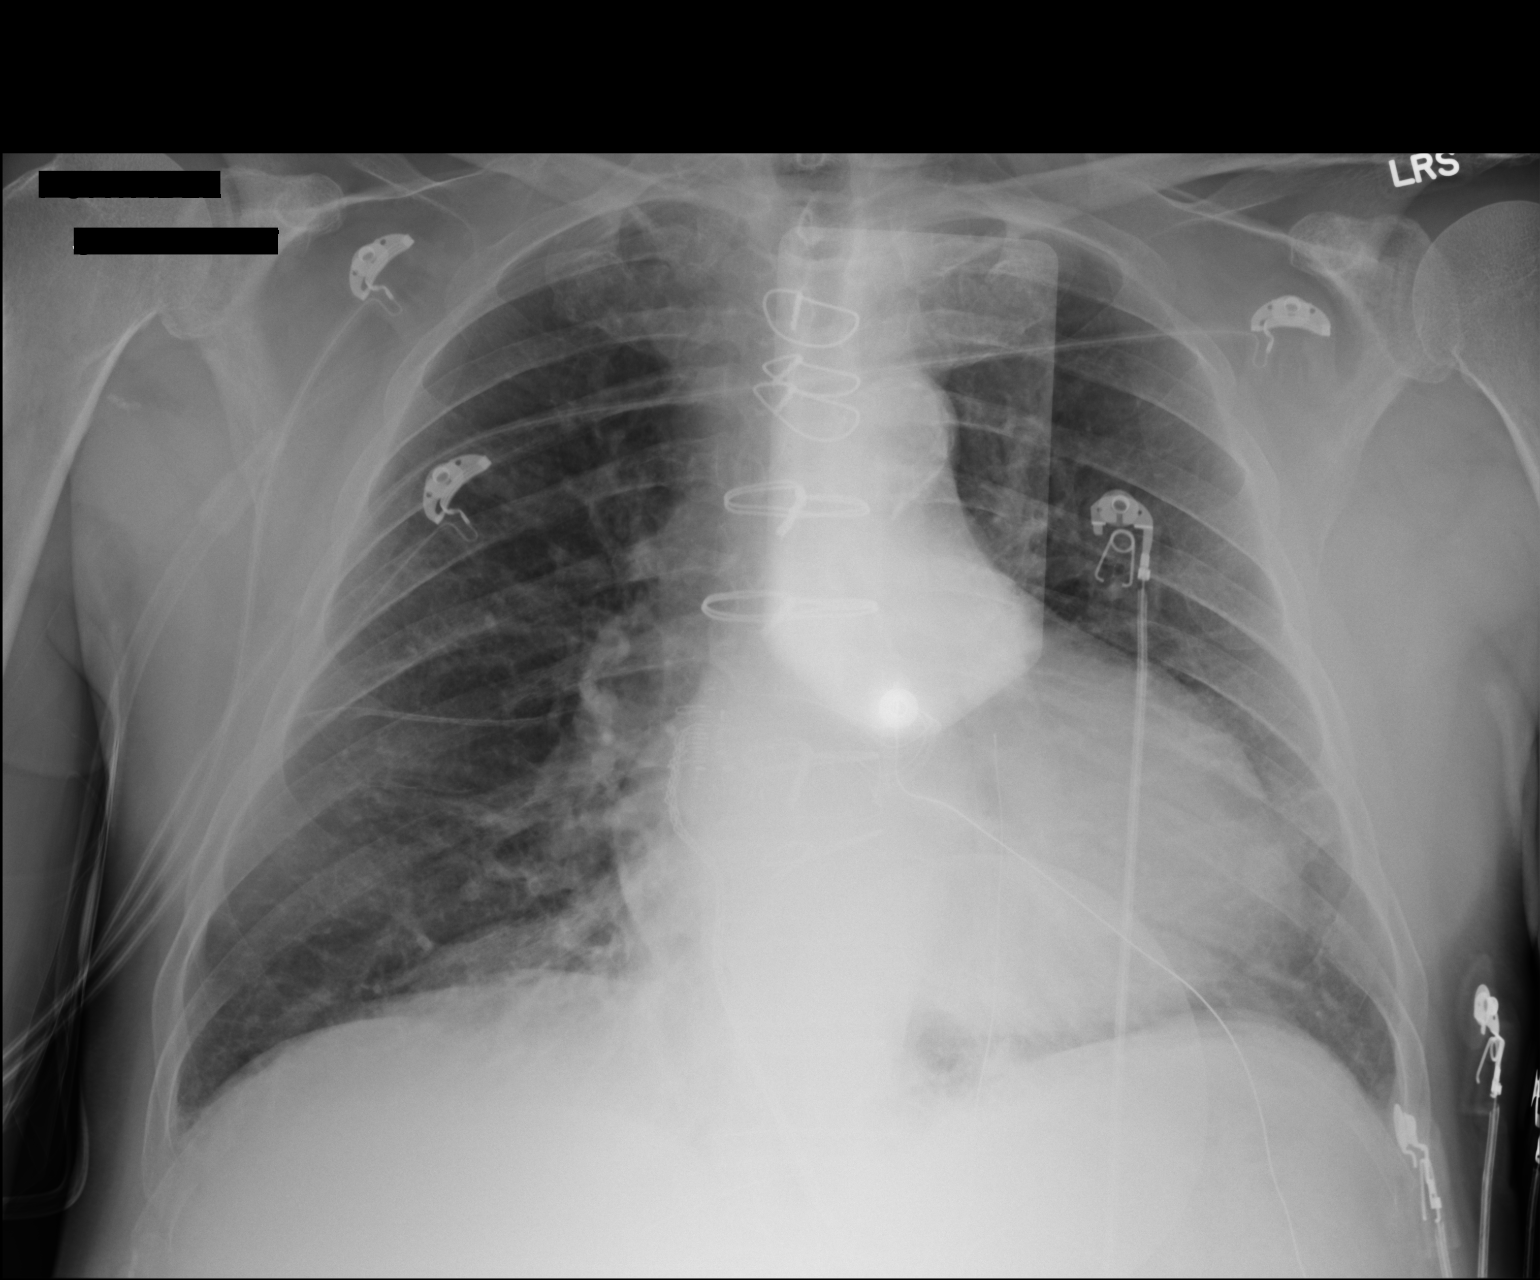

[1 of 1 positions shown; findings below may reference images not displayed]

FINDINGS: Patient is post median sternotomy. Borderline cardiomegaly. No
pulmonary edema. Mild hyperinflation and chronic interstitial
markings. Scarring at the left lung base. No confluent airspace
disease, large pleural effusion or pneumothorax. Overlying
monitoring devices in place.
IMPRESSION: 1. Borderline cardiomegaly.  No pulmonary edema.
2. Mild hyperinflation and chronic interstitial markings.

## 2018-01-05 IMAGING — DX DG CHEST 2V
2 series · 2 of 2 positions shown · non-contrast
Comparison: 11/07/2015 .

CLINICAL DATA: AICD.

EXAM:
CHEST  2 VIEW

[w chest pa]
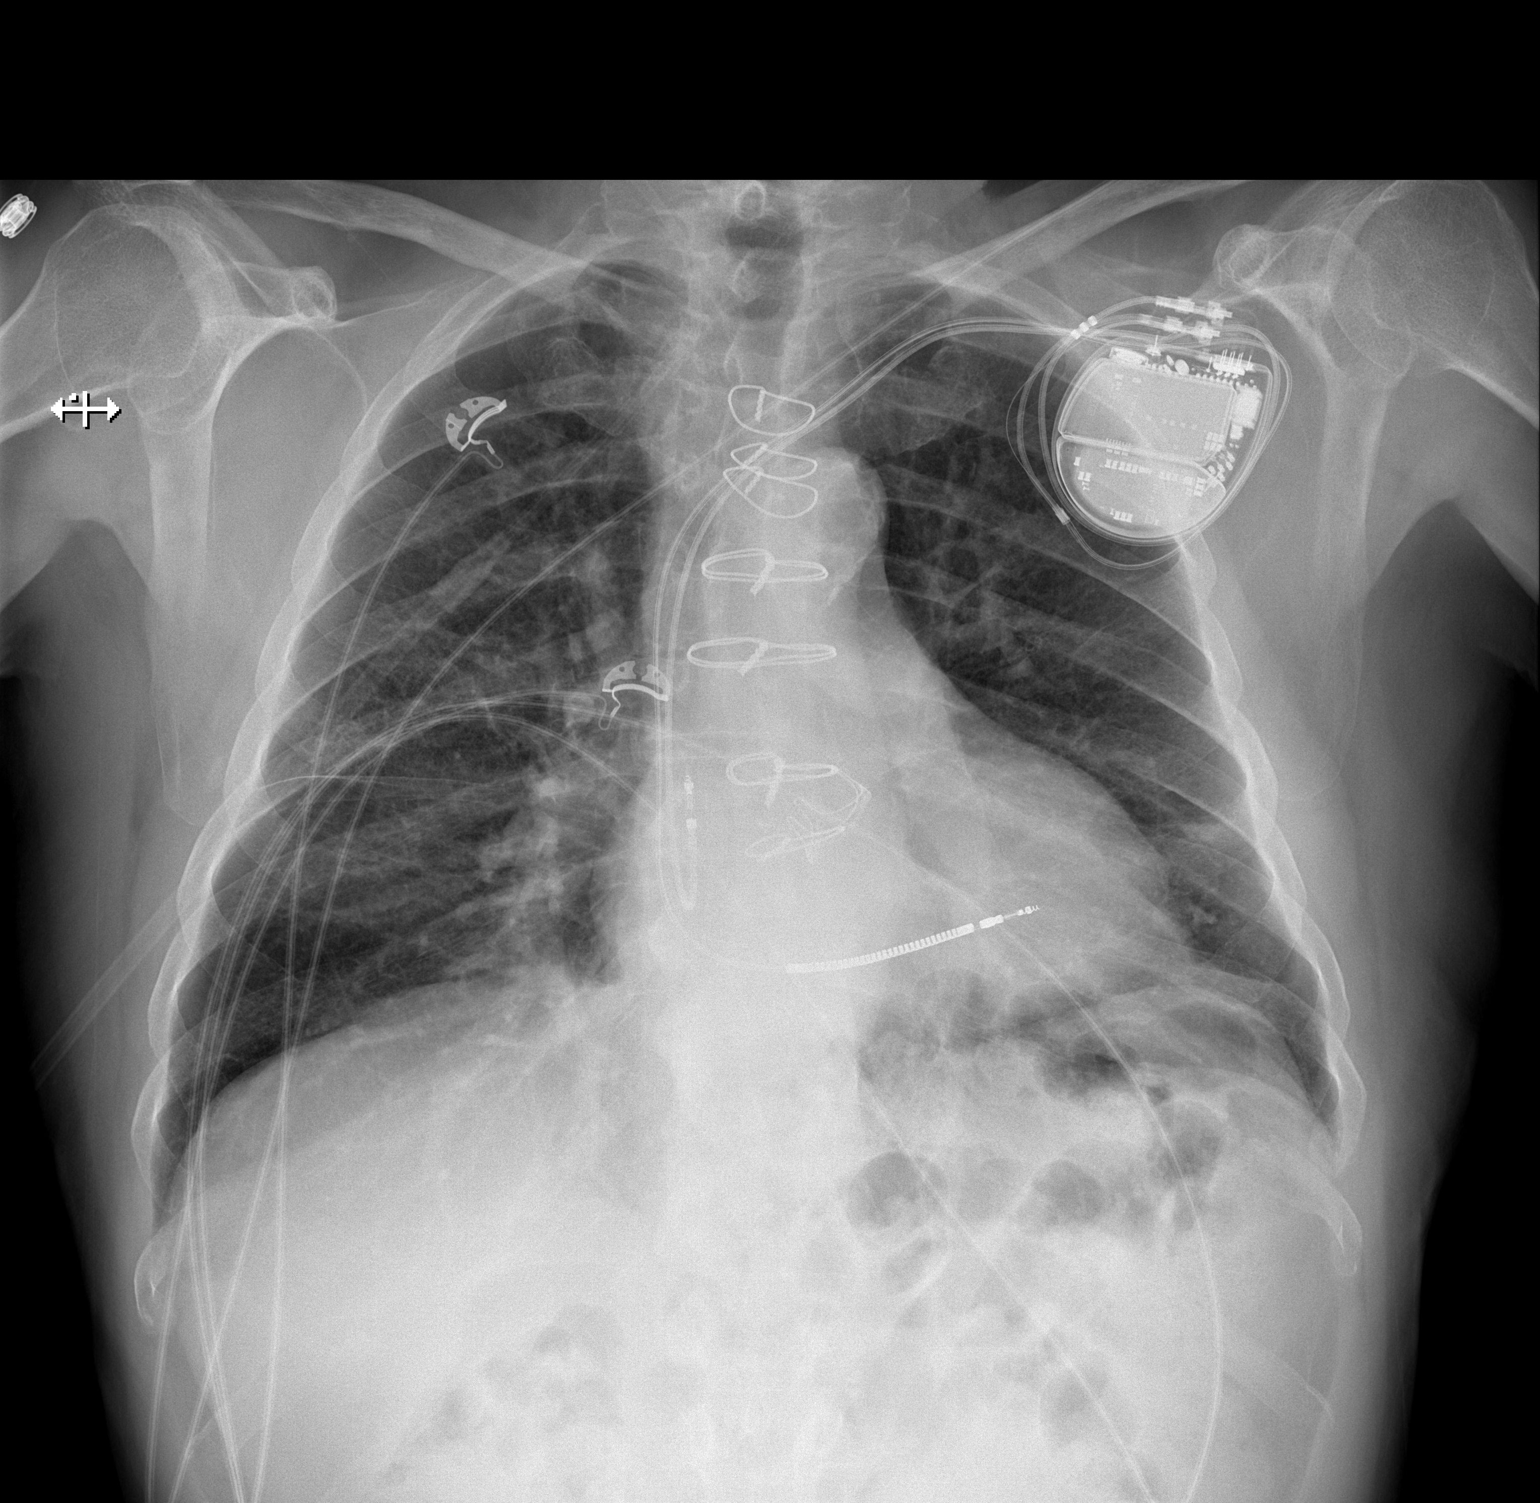

[w chest lat]
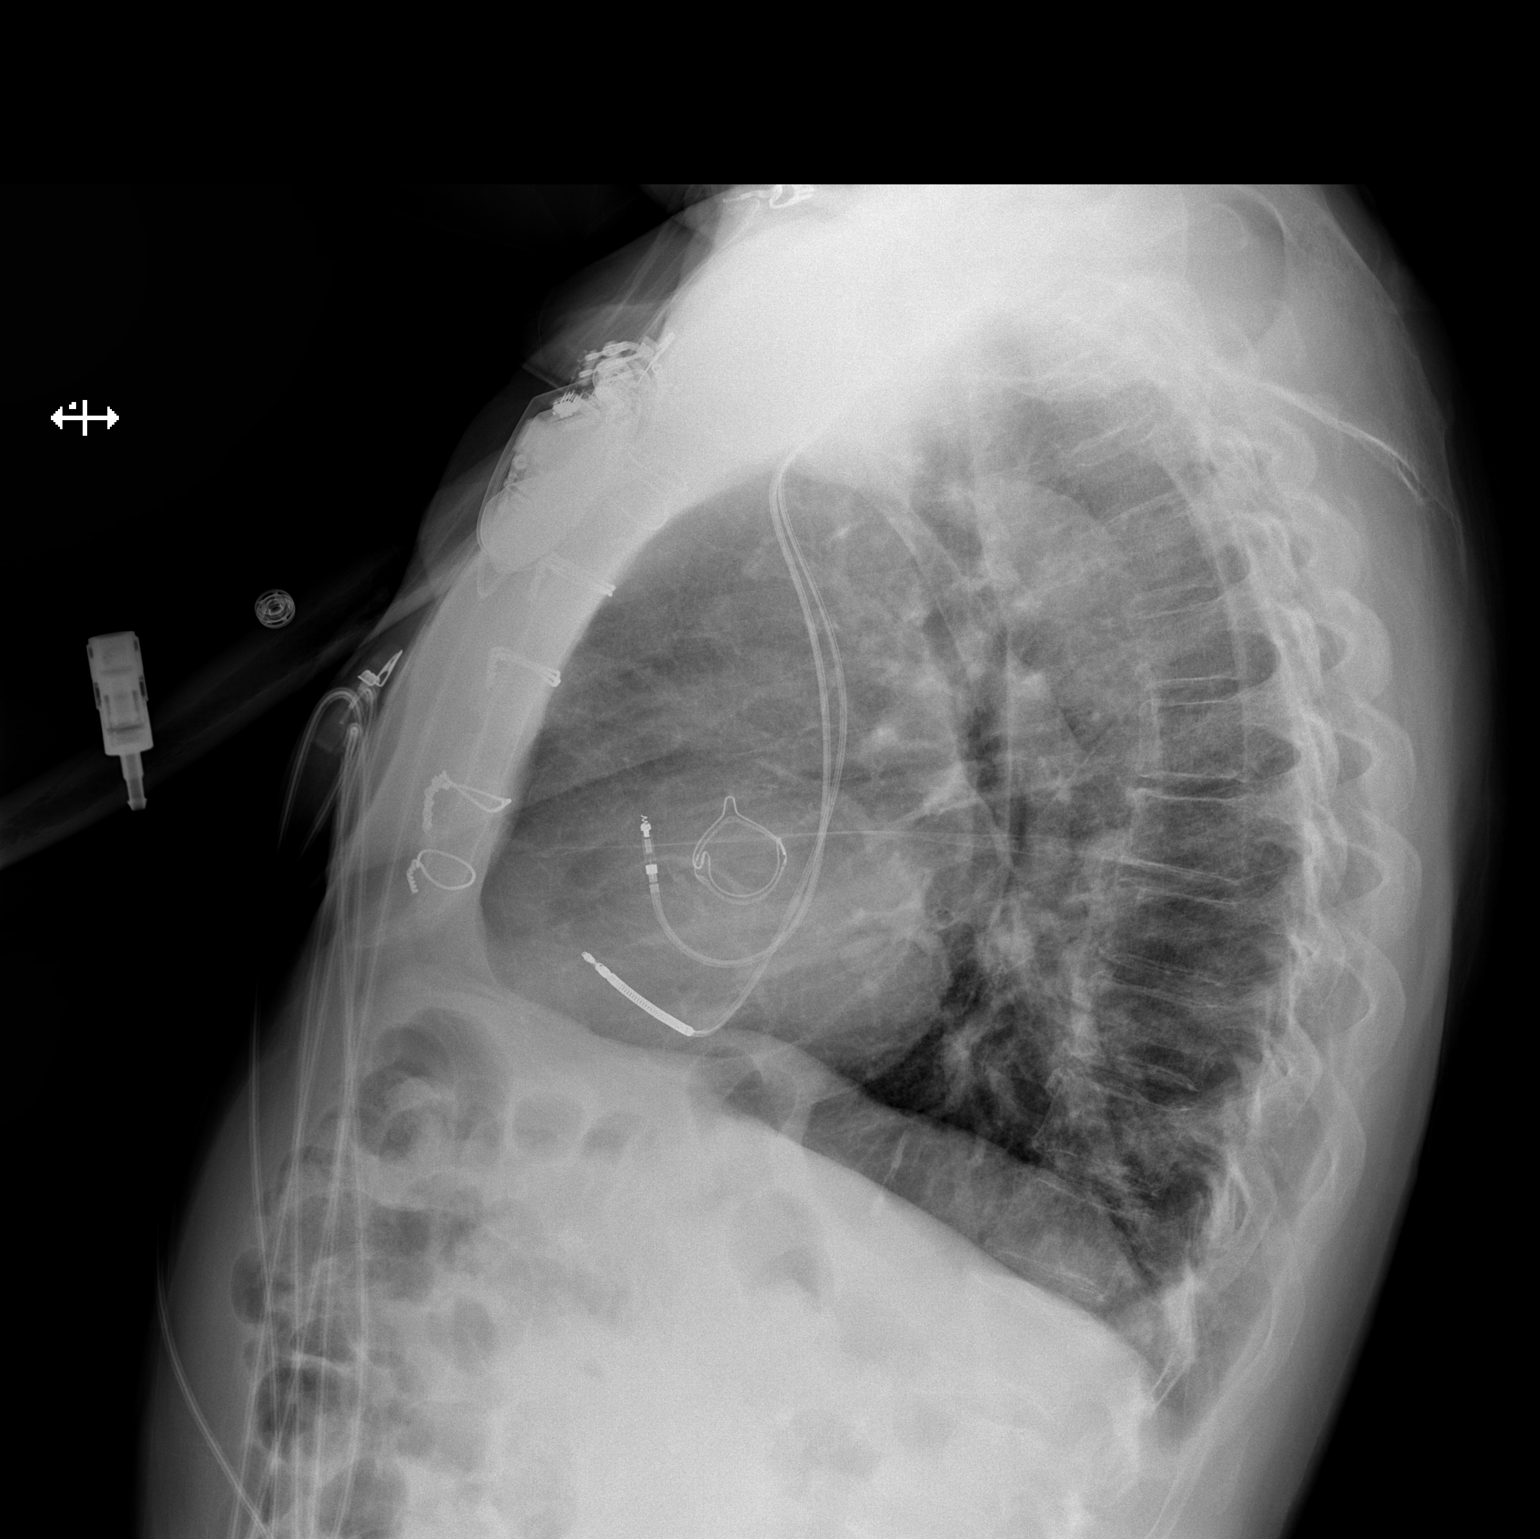

[2 of 2 positions shown; findings below may reference images not displayed]

FINDINGS: Interim placement AICD, lead tips noted over the right atrium right
ventricle. Cardiac valve replacement. Heart size stable. Low lung
volumes with mild bibasilar atelectasis. No pleural effusion or
pneumothorax.
IMPRESSION: 1. Replacement AICD, lead tips in right atrium right ventricle.
Prior cardiac valve replacement. Heart size normal.

2.  Low lung volumes with mild bibasilar atelectasis.

## 2018-03-24 ENCOUNTER — Ambulatory Visit (INDEPENDENT_AMBULATORY_CARE_PROVIDER_SITE_OTHER): Payer: Medicare Other | Admitting: *Deleted

## 2018-03-24 DIAGNOSIS — I472 Ventricular tachycardia, unspecified: Secondary | ICD-10-CM

## 2018-03-24 NOTE — Progress Notes (Signed)
Remote ICD transmission.   

## 2018-03-27 ENCOUNTER — Encounter: Payer: Self-pay | Admitting: Cardiology

## 2018-04-09 ENCOUNTER — Ambulatory Visit (INDEPENDENT_AMBULATORY_CARE_PROVIDER_SITE_OTHER): Payer: Medicare Other | Admitting: Cardiology

## 2018-04-09 ENCOUNTER — Encounter: Payer: Self-pay | Admitting: Cardiology

## 2018-04-09 VITALS — BP 121/72 | HR 89 | Ht 65.0 in | Wt 150.5 lb

## 2018-04-09 DIAGNOSIS — I472 Ventricular tachycardia, unspecified: Secondary | ICD-10-CM

## 2018-04-09 DIAGNOSIS — I2510 Atherosclerotic heart disease of native coronary artery without angina pectoris: Secondary | ICD-10-CM

## 2018-04-09 DIAGNOSIS — I10 Essential (primary) hypertension: Secondary | ICD-10-CM

## 2018-04-09 DIAGNOSIS — R0609 Other forms of dyspnea: Secondary | ICD-10-CM

## 2018-04-09 DIAGNOSIS — Z9861 Coronary angioplasty status: Secondary | ICD-10-CM

## 2018-04-09 DIAGNOSIS — I739 Peripheral vascular disease, unspecified: Secondary | ICD-10-CM

## 2018-04-09 DIAGNOSIS — Z952 Presence of prosthetic heart valve: Secondary | ICD-10-CM

## 2018-04-09 DIAGNOSIS — E785 Hyperlipidemia, unspecified: Secondary | ICD-10-CM

## 2018-04-09 DIAGNOSIS — R5383 Other fatigue: Secondary | ICD-10-CM

## 2018-04-09 NOTE — Patient Instructions (Signed)
Medication Instructions:  NOT NEEDED If you need a refill on your cardiac medications before your next appointment, please call your pharmacy.   Lab work: NOT NEEDED If you have labs (blood work) drawn today and your tests are completely normal, you will receive your results only by: [name] MyChart Message (if you have MyChart) OR . A paper copy in the mail If you have any lab test that is abnormal or we need to change your treatment, we will call you to review the results.  Testing/Procedures: NOT NEEDED  Follow-Up: At [facility], you and your health needs are our priority.  As part of our continuing mission to provide you with exceptional heart care, we have created designated Provider Care Teams.  These Care Teams include your primary Cardiologist (physician) and Advanced Practice Providers (APPs -  Physician Assistants and Nurse Practitioners) who all work together to provide you with the care you need, when you need it. You will need a follow up appointment in 6 months.  Please call our office 2 months in advance to schedule this appointment.  You may see Glenetta Hew, MD or one of the following Advanced Practice Providers on your designated Care Team:   Rosaria Ferries, PA-C . Jory Sims, DNP, ANP  Any Other Special Instructions Will Be Listed Below (If Applicable).

## 2018-04-09 NOTE — Progress Notes (Signed)
PCP: [name], MD  Clinic Note: Chief Complaint  Patient presents with  . Follow-up    No new complaints.  [name] PAD    Lifestyle limiting claudication, but not interested in surgery  . Coronary Artery Disease    No angina    HPI: Ralph Drake is a 83 y.o. male with a PMH below who presents today for Six-month follow-up for CAD/PAD & s/p AVR as well as status post ICD for sustained V. tach.  CAD (coronary angioplasty x 2 1993) Cath May 2017: Mod (mod-severe CAD): p-mCx 50%. mLAD ~40%, mRCA ~40%. dRCA ~70%, No culprit lesion for VT.  PAD (SFA stent 1996, SFA occlusion 2015) ,  September 04, 2017 ABIs: (Right 0.74, left 0.70) right ABIs decreased compared to 2014 but left is stable. --Indicates moderate bilateral lower extremity arterial disease. -->  Duplex: Hard plaque throughout. ->  Abdominal aortic duplex was recommended. (Abdominal duplex was inadequate)  H/o AS: s/p AVR (23 [facility] Ease Bovine Pericardial Tissue Valve 2014) with Cardiomyopathy (mixed Valvular & Ischemic).  2D Echo August 22, 2017: EF 50 and 55% with moderate LVH.  No regional wall motion normalities.  GR 1 DD.  Bioprosthetic aortic valve present -normal gradients.  Recurrent VT -- s/p ICD - multiple shocks -- > ICD Implant 11/10/2015 (Dr. [name]) - St. Jude ICD L Ax. V.; readmitted for frequent VT - new ICD  re-implant for device failure 05/2016. (Dr. [name])  Postop atrial fib -on amiodarone.  Not on [place]. -Was only noted postop  He has known solitary kidney - CKD III along with HTN, HLD.   CARTEL MAUSS was last seen in April 2019 - doing well.  Limiting claudication.  He was also just seen by Dr. Crissie Sickles in February 2019.  Doing well.  Noting that he is playing golf on warm days.  No syncope no ICD shocks.   Recent Hospitalizations: None  Studies Personally Reviewed - (if available, images/films reviewed: From Epic Chart or Care Everywhere)  N/a  Interval History: Ralph Drake presents today  saying that he is doing fairly well with no major complaints.  He still has pretty limiting claudication mostly in the left leg and bilateral calfs -can walk about 100 feet without stopping.  But he was not interested in surgical repair.  He also notes that in addition to having claudication he does have some fatigue and dyspnea with exertion, but this is also pretty stable for him.  He denies any chest tightness or pressure with rest or exertion to the extent of what he is able to exert.  He denies any PND, orthopnea with minimal end of day edema edema.  No rapid irregular heartbeats or palpitations.  No syncope/near syncope or TIA/amaurosis fugax.  Occasional orthostatic dizziness but not significant.  We did talk briefly about lipid management.  He indicates that he would not be interested in a new medication.  He is pretty emphatic about his lack of desire being overly aggressive.  ROS: A comprehensive was performed. Review of Systems  Constitutional: Negative for malaise/fatigue and weight loss.  HENT: Negative for congestion.   Eyes: Negative.   Respiratory: Positive for shortness of breath (Baseline). Negative for cough and sputum production.   Cardiovascular: Positive for claudication.  Gastrointestinal: Negative for heartburn and nausea.  Genitourinary: Negative for dysuria and flank pain.  Musculoskeletal: Positive for joint pain (Normal arthritis pains.). Negative for back pain and myalgias.  Neurological: Positive for dizziness (Occasional orthostatic dizziness at  night - ).  Endo/Heme/Allergies: Negative for environmental allergies.  Psychiatric/Behavioral: Positive for memory loss.  All other systems reviewed and are negative.   I have reviewed and (if needed) personally updated the patient's problem list, medications, allergies, past medical and surgical history, social and family history.   Past Medical History:  Diagnosis Date  . Bradycardia   . CAD S/P percutaneous  coronary angioplasty 1993   POBA to Cx x 2 occasions: 10/2015:Mod (mod-severe CAD): p-mCx 50%. mLAD ~40%, mRCA ~40%. dRCA ~70%, No culprit lesion for VT.  [name] Dyslipidemia, goal LDL below 70   . Former heavy tobacco smoker     quit in October 2014  . GERD (gastroesophageal reflux disease)   . Hypertension   . Olecranon bursitis of left elbow   . Osteoarthritis   . Peripheral vascular disease ([place])     H/O R SFA stent, L Iliac Stent 1996;;  Lower Extremity Angiography April 2015: Occluded left SFA reconstituting at end of the canal, heavily calcified. Three-vessel runoff..;; RCA 50% ostial right common iliac, patent SFA stent -> Referred to Dr. Trula Slade of [facility].  . Postoperative atrial fibrillation (HCC)    No recurrence  . S/P AVR (aortic valve replacement) 02/10/2013   23 [facility] Ease Pericardial Tissue Valve  . Severe aortic stenosis by prior echocardiogram    per Dr. Rollene Fare 2012; referred for aortic valve replacement  . Shortness of breath    Chronic  . Trigger ring finger of left hand   . Tubular adenoma of colon   . VT (ventricular tachycardia) ([place])    hemodynamically significant VT on 11/07/2015, nonobstructive CAD, no culprit lesion, underwent St Jude Dual Chamber ICD by Dr. [name]    Past Surgical History:  Procedure Laterality Date  . ABDOMINAL ANGIOGRAM  09/10/2013   Procedure: ABDOMINAL ANGIOGRAM;  Surgeon: [name], MD;  Location[facility] CATH LAB;  Service: Cardiovascular;;  . AORTIC VALVE REPLACEMENT N/A 02/10/2013   AORTIC VALVE REPLACEMENT (AVR);  Surgeon: Grace Isaac, MD;  Location: [facility] OR;  Service: Open Heart Surgery:  Dr. Servando Snare: [address] Ease Pericardia  . ARTERIAL DOPPLER - PRE-CABG  02/06/2013   Bilateral 1-39% ICA stenosis  . CARDIAC CATHETERIZATION  August 2014   Nonobstructive 30-50% RCA. No significant LAD disease. 30% mid circumflex.  [name] CARDIAC CATHETERIZATION  11/10/2009   No intervention - recommend medical management  . CARDIAC  CATHETERIZATION N/A 11/09/2015   Procedure: Left Heart Cath and Coronary Angiography;  Surgeon: Belva Crome, MD;  Location: [facility] INVASIVE CV LAB: Mod (mod-severe CAD): p-mCx 50%. mLAD ~40%, mRCA ~40%. dRCA ~70%, No culprit lesion for VT.  [name] CARDIOVASCULAR STRESS TEST  06/24/2012   Diffuse LV hypokinesis with moderately depressed systolic function and EF 66%,  . CORONARY ANGIOPLASTY  1993   Circumflex x 2   . EP IMPLANTABLE DEVICE N/A 11/10/2015   Procedure: ICD Implant;  Surgeon: Evans Lance, MD;  Location: [place] CV LAB;  Service: Cardiovascular;  Laterality: N/A;  . ICD GENERATOR CHANGEOUT N/A 05/31/2016   Procedure: ICD Generator Changeout;  Surgeon: Thompson Grayer, MD;  Location: [place] CV LAB;  Service: Cardiovascular;  Laterality: N/A;  . ILIAC ARTERY STENT Left 1996  . INTRAOPERATIVE TRANSESOPHAGEAL ECHOCARDIOGRAM N/A 02/10/2013   Procedure: INTRAOPERATIVE TRANSESOPHAGEAL ECHOCARDIOGRAM;  Surgeon: Grace Isaac, MD;  Location: [place];  Service: Open Heart Surgery;  Laterality: N/A;  . LEFT AND RIGHT HEART CATHETERIZATION WITH CORONARY ANGIOGRAM N/A 01/28/2013   Procedure:  LEFT AND RIGHT HEART CATHETERIZATION WITH CORONARY ANGIOGRAM;  Surgeon: Leonie Man, MD;  Location: [facility] CATH LAB;  Service: Cardiovascular;  Laterality: N/A;  . LOWER EXTREMITY ANGIOGRAM N/A 09/10/2013   Procedure: LOWER EXTREMITY ANGIOGRAM;  Surgeon: [name], MD;  Location: [facility] CATH LAB;  Service: Cardiovascular;  Laterality: N/A;  . OLECRANON BURSECTOMY  07/23/2011   Procedure: OLECRANON BURSA;  Surgeon: Lorn Junes, MD;  Location: [place];  Service: Orthopedics;  Laterality: Left;  excision olecranon bursa left elbow patient had supraclaviular block in preop  . PERIPHERAL VASCULAR ANGIOGRAM  07/19/1994   No intervention-recommend medical therapy  . PERIPHERAL VASCULAR ANGIOGRAM  01/17/1995   Right SFA 80-90# lesion, dilatation performed with a Cordis "Optz 5" 46mm x 2cm, inflated a  4-7.5atm for 40 to 70 seconds. Completion angiogram done by hand with "bolus chase" runoff from Primal SFA to foot. [place] in reduction of 80-90% to 20% with excellent flow and without dissection  . PERIPHERAL VASCULAR ANGIOGRAM  09/10/2013   Occluded left SFA, heavily calcified with reconstitution at adductor canal. Three-vessel runof.;; Right lower extremity noted 50% ostial right common iliac stenosis with roughly 20 mm gradient. Not considered significant.f  . right hand surgery    . ROTATOR CUFF REPAIR Right   . Superficial Femoral Atery stent Right 1996  . TONSILLECTOMY     as child  . TRANSTHORACIC ECHOCARDIOGRAM  03/23/2013   Post-Op Echo #1: EF 50-55%, Mild Conc LVH, Mild HK of Inferolateral wall with paradoxical septal motion (c/w post-op state); Well seated 23 mm Bioprosthetic Valve (area ~1.23 cm2 - normal for valve); Mild MR  . TRANSTHORACIC ECHOCARDIOGRAM  March 2017   EF 50-55%. No RWMA.  GR 1 DD.  23 mm bioprosthetic valve functioning normally.  Mild biatrial dilation.  . TRANSTHORACIC ECHOCARDIOGRAM  08/2017   EF 50 and 55% with moderate LVH.  No regional wall motion normalities.  GR 1 DD.  Bioprosthetic aortic valve present -normal gradients.  [name] TRIGGER FINGER RELEASE  07/23/2011   Procedure: RELEASE TRIGGER FINGER/A-1 PULLEY;  Surgeon: Lorn Junes, MD;  Location: [place];  Service: Orthopedics;  Laterality: Left;  release trigger left ring finger patient had supraclavicular block in preop    Current Meds  Medication Sig  . amiodarone (PACERONE) 200 MG tablet TAKE 1 TABLET BY MOUTH  DAILY  . amLODipine (NORVASC) 10 MG tablet TAKE 1 TABLET BY MOUTH  EVERY DAY  . aspirin 81 MG EC tablet Take 1 tablet (81 mg total) by mouth daily.  [name] atorvastatin (LIPITOR) 80 MG tablet TAKE 1 TABLET BY MOUTH  DAILY  . cilostazol (PLETAL) 100 MG tablet TAKE 1 TABLET BY MOUTH  TWICE A DAY  . clopidogrel (PLAVIX) 75 MG tablet TAKE 1 TABLET BY MOUTH  DAILY  . ezetimibe  (ZETIA) 10 MG tablet TAKE 1 TABLET BY MOUTH  DAILY  . fish oil-omega-3 fatty acids 1000 MG capsule Take 1 g by mouth 2 (two) times daily.   [name] levothyroxine (SYNTHROID, LEVOTHROID) 50 MCG tablet Take 50 mcg by mouth daily before breakfast.  . lisinopril (PRINIVIL,ZESTRIL) 10 MG tablet TAKE 1 TABLET BY MOUTH  DAILY  . Multiple Vitamins-Minerals (MULTIVITAMIN WITH MINERALS) tablet Take 1 tablet by mouth daily.  . vitamin E 400 UNIT capsule Take 2 capsules by mouth daily.   Current Facility-Administered Medications for the 04/09/18 encounter (Office Visit) with Leonie Man, MD  Medication  . 0.9 %  sodium chloride infusion  No Known Allergies  Social History   Tobacco Use  . Smoking status: Former Smoker    Packs/day: 0.50    Years: 60.00    Pack years: 30.00    Types: Cigarettes    Last attempt to quit: 01/31/2013    Years since quitting: 5.1  . Smokeless tobacco: Never Used  Substance Use Topics  . Alcohol use: No    Alcohol/week: 0.0 standard drinks  . Drug use: No   Social History   Social History Narrative   Married with no children. He long-term smoker who quit prior to his aVR in 2014.    Up until his operation he worked part-time at [employer].  He is not yet gone back to work postoperatively.   He chose not be cardiac rehabilitation, but has gone back to walking exercising daily.    family history includes Cancer in his mother; Hypertension in his mother; Stroke in his father.  Wt Readings from Last 3 Encounters:  04/09/18 150 lb 8 oz (68.3 kg)  10/07/17 150 lb (68 kg)  07/23/17 151 lb (68.5 kg)    PHYSICAL EXAM BP 121/72   Pulse 89   Ht 5\' 5"  (1.651 m)   Wt 150 lb 8 oz (68.3 kg)   BMI 25.04 kg/m  Physical Exam  Constitutional: He is oriented to person, place, and time. He appears well-developed and well-nourished. No distress.  Healthy appearing  HENT:  Head: Normocephalic and atraumatic.  Neck: Normal range of motion. Neck supple. No  hepatojugular reflux and no JVD present. Carotid bruit is present (R>L).  Cardiovascular: Normal rate and regular rhythm.  Occasional extrasystoles are present. PMI is not displaced. Exam reveals decreased pulses (Bilateral AT pulses decreased ). Exam reveals no gallop and no friction rub.  Murmur (Soft 1/6 SEM at RUSB.) heard. Pulmonary/Chest: Effort normal. No respiratory distress. He has no wheezes. He exhibits no tenderness.  Mild diffuse crackles -mostly noted in left lower lung field.  No rales or rhonchi.  Abdominal: Soft. Bowel sounds are normal. He exhibits no distension. There is no tenderness. There is no rebound.  Musculoskeletal: Normal range of motion. He exhibits no edema.  Neurological: He is alert and oriented to person, place, and time.  Psychiatric: He has a normal mood and affect. His behavior is normal. Thought content normal.  Nursing note and vitals reviewed.    Adult ECG Report Not checked  Other studies Reviewed: Additional studies/ records that were reviewed today include:  Recent Labs: March 26, 2018 Na+ 136, K+ 5.1, Cl- 102, HCO3-22.8, BUN 29, Cr 1.7, Glu 95, Ca2+ 10.6; AST 19, ALT 17, AlkP 84 CBC: W 6.3, H/H 14.2/42.3, Plt 2.24 TC 178, TG 88, HDL 63.9, LDL 93   ASSESSMENT / PLAN: Problem List Items Addressed This Visit    CAD S/P PTCA of CFX X 2 in '93 - Primary (Chronic)    Distant history of angioplasty with no stents that I can tell.  Moderate disease by cath in 2017.  No active anginal symptoms. He is on aspirin and Plavix for PAD and CAD.  Also on Pletal Is on amiodarone, therefore not on beta-blocker along with amlodipine and ACE inhibitor. On statin plus Zetia.  Since he is not currently interested in being more aggressive with his antilipid management, would not make any further adjustment based on stable blood pressure and stable symptoms.      Dyslipidemia, goal LDL below 70 (Chronic)    He is on high-dose statin  and Zetia.  LDL still 93.  I  talked to him about potentially switching statin which we could consider doing, but he is not interested in PCSK9 inhibitor. Readdress at next visit, consider converting to Crestor/rosuvastatin      Essential hypertension (Chronic)    .Well-controlled pressures on current meds.  No change.  Avoid being aggressive because of dizziness. Would err on the side of permissive hypertension      Exertional dyspnea (Chronic)    Stable for him no real change.  He probably does have some baseline lung disease and likely diastolic dysfunction.  He is more limited by claudication and dyspnea.      Fatigue due to treatment    Much better since stopping beta-blocker.      Peripheral vascular disease - s/p L Iliac stent (Chronic)    Medical management of left SFA occlusion.  He is on amlodipine cilostazol as well as aspirin Plavix.  We talked about potentially converting him aspirin/Plavix to aspirin low-dose Xarelto, but he was not interested in making adjustments.  Not interested in surgical repair at this time. Continue to follow ABIs.  Would like to see abdominal imaging, but ultrasound was inadequate.  May need to do CT angiogram next year.      S/P tissue AVR 02/11/13 (Chronic)    Stable echo in March.  Recheck in 2021 unless symptoms or murmur worsens.      Sustained ventricular tachycardia (HCC) (Chronic)    Status post ICD.  Followed by EP (Dr. [name]).  No suggestion of recurrent episodes on amiodarone.  Needs annual TFTs, LFTs and annual eye exam.         Current medicines are reviewed at length with the patient today. (+/- concerns) n/a The following changes have been made: n/a  Patient Instructions  Medication Instructions:  NOT NEEDED If you need a refill on your cardiac medications before your next appointment, please call your pharmacy.   Lab work: NOT NEEDED If you have labs (blood work) drawn today and your tests are completely normal, you will receive your results  only by: [name] MyChart Message (if you have MyChart) OR . A paper copy in the mail If you have any lab test that is abnormal or we need to change your treatment, we will call you to review the results.  Testing/Procedures: NOT NEEDED  Follow-Up: At [facility], you and your health needs are our priority.  As part of our continuing mission to provide you with exceptional heart care, we have created designated Provider Care Teams.  These Care Teams include your primary Cardiologist (physician) and Advanced Practice Providers (APPs -  Physician Assistants and Nurse Practitioners) who all work together to provide you with the care you need, when you need it. You will need a follow up appointment in 6 months.  Please call our office 2 months in advance to schedule this appointment.  You may see Glenetta Hew, MD or one of the following Advanced Practice Providers on your designated Care Team:   Rosaria Ferries, PA-C . Jory Sims, DNP, ANP  Any Other Special Instructions Will Be Listed Below (If Applicable).      Studies Ordered:   No orders of the defined types were placed in this encounter.     Glenetta Hew, M.D., M.S. Interventional Cardiologist   Pager # [phone] Phone # [phone] [address] [place], [place] [address]

## 2018-04-11 ENCOUNTER — Encounter: Payer: Self-pay | Admitting: Cardiology

## 2018-04-11 NOTE — Assessment & Plan Note (Signed)
Status post ICD.  Followed by EP (Dr. [name]).  No suggestion of recurrent episodes on amiodarone.  Needs annual TFTs, LFTs and annual eye exam.

## 2018-04-11 NOTE — Assessment & Plan Note (Signed)
Stable echo in March.  Recheck in 2021 unless symptoms or murmur worsens.

## 2018-04-11 NOTE — Assessment & Plan Note (Signed)
Distant history of angioplasty with no stents that I can tell.  Moderate disease by cath in 2017.  No active anginal symptoms. He is on aspirin and Plavix for PAD and CAD.  Also on Pletal Is on amiodarone, therefore not on beta-blocker along with amlodipine and ACE inhibitor. On statin plus Zetia.  Since he is not currently interested in being more aggressive with his antilipid management, would not make any further adjustment based on stable blood pressure and stable symptoms.

## 2018-04-11 NOTE — Assessment & Plan Note (Signed)
Stable for him no real change.  He probably does have some baseline lung disease and likely diastolic dysfunction.  He is more limited by claudication and dyspnea.

## 2018-04-11 NOTE — Assessment & Plan Note (Signed)
Medical management of left SFA occlusion.  He is on amlodipine cilostazol as well as aspirin Plavix.  We talked about potentially converting him aspirin/Plavix to aspirin low-dose Xarelto, but he was not interested in making adjustments.  Not interested in surgical repair at this time. Continue to follow ABIs.  Would like to see abdominal imaging, but ultrasound was inadequate.  May need to do CT angiogram next year.

## 2018-04-11 NOTE — Assessment & Plan Note (Addendum)
.  Well-controlled pressures on current meds.  No change.  Avoid being aggressive because of dizziness. Would err on the side of permissive hypertension

## 2018-04-11 NOTE — Assessment & Plan Note (Signed)
He is on high-dose statin and Zetia.  LDL still 93.  I talked to him about potentially switching statin which we could consider doing, but he is not interested in PCSK9 inhibitor. Readdress at next visit, consider converting to Crestor/rosuvastatin

## 2018-04-11 NOTE — Assessment & Plan Note (Signed)
Much better since stopping beta-blocker.

## 2018-04-23 LAB — CUP PACEART REMOTE DEVICE CHECK
Battery Voltage: 2.99 V
Brady Statistic AP VP Percent: 3 %
Brady Statistic AP VS Percent: 78 %
Brady Statistic AS VP Percent: 1 %
Brady Statistic RA Percent Paced: 79 %
Date Time Interrogation Session: 20191014123516
HIGH POWER IMPEDANCE MEASURED VALUE: 73 Ohm
HighPow Impedance: 73 Ohm
Implantable Lead Implant Date: 20170601
Implantable Lead Location: 753859
Implantable Lead Location: 753860
Lead Channel Impedance Value: 510 Ohm
Lead Channel Pacing Threshold Amplitude: 0.75 V
Lead Channel Pacing Threshold Amplitude: 0.75 V
Lead Channel Pacing Threshold Pulse Width: 0.5 ms
Lead Channel Sensing Intrinsic Amplitude: 3.5 mV
Lead Channel Setting Pacing Amplitude: 2.5 V
Lead Channel Setting Sensing Sensitivity: 0.5 mV
MDC IDC LEAD IMPLANT DT: 20170601
MDC IDC MSMT BATTERY REMAINING LONGEVITY: 71 mo
MDC IDC MSMT BATTERY REMAINING PERCENTAGE: 80 %
MDC IDC MSMT LEADCHNL RV IMPEDANCE VALUE: 430 Ohm
MDC IDC MSMT LEADCHNL RV PACING THRESHOLD PULSEWIDTH: 0.5 ms
MDC IDC MSMT LEADCHNL RV SENSING INTR AMPL: 3 mV
MDC IDC PG IMPLANT DT: 20171221
MDC IDC SET LEADCHNL RA PACING AMPLITUDE: 2 V
MDC IDC SET LEADCHNL RV PACING PULSEWIDTH: 0.5 ms
MDC IDC STAT BRADY AS VS PERCENT: 18 %
MDC IDC STAT BRADY RV PERCENT PACED: 3 %
Pulse Gen Serial Number: 7390376

## 2018-05-20 ENCOUNTER — Other Ambulatory Visit: Payer: Self-pay | Admitting: Cardiology

## 2018-05-20 DIAGNOSIS — E785 Hyperlipidemia, unspecified: Secondary | ICD-10-CM

## 2018-05-20 MED ORDER — ATORVASTATIN CALCIUM 80 MG PO TABS: 80.0000 mg | ORAL_TABLET | Freq: Every day | ORAL | 3 refills | Status: DC

## 2018-05-20 MED ORDER — AMLODIPINE BESYLATE 10 MG PO TABS: 10.0000 mg | ORAL_TABLET | Freq: Every day | ORAL | 3 refills | Status: DC

## 2018-05-20 MED ORDER — CILOSTAZOL 100 MG PO TABS: 100.0000 mg | ORAL_TABLET | Freq: Two times a day (BID) | ORAL | 3 refills | Status: DC

## 2018-05-20 NOTE — Telephone Encounter (Signed)
New message    *STAT* If patient is at the pharmacy, call can be transferred to refill team.   1. Which medications need to be refilled? (please list name of each medication and dose if known) amLODipine (NORVASC) 10 MG tablet, atorvastatin (LIPITOR) 80 MG tablet, cilostazol (PLETAL) 100 MG tablet  2. Which pharmacy/location (including street and city if local pharmacy) is medication to be sent to?[place], [place]  3. Do they need a 30 day or 90 day supply? [place]

## 2018-06-23 ENCOUNTER — Ambulatory Visit (INDEPENDENT_AMBULATORY_CARE_PROVIDER_SITE_OTHER): Payer: Medicare Other

## 2018-06-23 DIAGNOSIS — I472 Ventricular tachycardia, unspecified: Secondary | ICD-10-CM

## 2018-06-24 NOTE — Progress Notes (Signed)
Remote ICD transmission.   

## 2018-06-25 LAB — CUP PACEART REMOTE DEVICE CHECK
Battery Remaining Longevity: 68 mo
Battery Remaining Percentage: 77 %
Battery Voltage: 2.98 V
Brady Statistic AP VP Percent: 3 %
Brady Statistic AS VS Percent: 19 %
Brady Statistic RV Percent Paced: 3 %
HIGH POWER IMPEDANCE MEASURED VALUE: 68 Ohm
HighPow Impedance: 68 Ohm
Implantable Lead Implant Date: 20170601
Implantable Lead Location: 753860
Implantable Lead Model: 7122
Implantable Pulse Generator Implant Date: 20171221
Lead Channel Impedance Value: 410 Ohm
Lead Channel Pacing Threshold Amplitude: 0.75 V
Lead Channel Pacing Threshold Pulse Width: 0.5 ms
Lead Channel Pacing Threshold Pulse Width: 0.5 ms
Lead Channel Sensing Intrinsic Amplitude: 5.1 mV
Lead Channel Setting Pacing Amplitude: 2 V
Lead Channel Setting Pacing Amplitude: 2.5 V
MDC IDC LEAD IMPLANT DT: 20170601
MDC IDC LEAD LOCATION: 753859
MDC IDC MSMT LEADCHNL RA IMPEDANCE VALUE: 480 Ohm
MDC IDC MSMT LEADCHNL RA SENSING INTR AMPL: 4.3 mV
MDC IDC MSMT LEADCHNL RV PACING THRESHOLD AMPLITUDE: 0.75 V
MDC IDC SESS DTM: 20200113133225
MDC IDC SET LEADCHNL RV PACING PULSEWIDTH: 0.5 ms
MDC IDC SET LEADCHNL RV SENSING SENSITIVITY: 0.5 mV
MDC IDC STAT BRADY AP VS PERCENT: 77 %
MDC IDC STAT BRADY AS VP PERCENT: 1 %
MDC IDC STAT BRADY RA PERCENT PACED: 78 %
Pulse Gen Serial Number: 7390376

## 2018-07-16 ENCOUNTER — Other Ambulatory Visit: Payer: Self-pay | Admitting: Internal Medicine

## 2018-07-30 ENCOUNTER — Encounter: Payer: Self-pay | Admitting: Internal Medicine

## 2018-07-30 ENCOUNTER — Ambulatory Visit (INDEPENDENT_AMBULATORY_CARE_PROVIDER_SITE_OTHER): Payer: Medicare Other | Admitting: Internal Medicine

## 2018-07-30 VITALS — BP 150/80 | HR 70 | Ht 65.0 in | Wt 154.0 lb

## 2018-07-30 DIAGNOSIS — I472 Ventricular tachycardia, unspecified: Secondary | ICD-10-CM

## 2018-07-30 DIAGNOSIS — I428 Other cardiomyopathies: Secondary | ICD-10-CM

## 2018-07-30 DIAGNOSIS — Z95810 Presence of automatic (implantable) cardiac defibrillator: Secondary | ICD-10-CM

## 2018-07-30 NOTE — Patient Instructions (Signed)
Medication Instructions:  Your physician recommends that you continue on your current medications as directed. Please refer to the Current Medication list given to you today.  Labwork: None ordered.  Testing/Procedures: None ordered.  Follow-Up: Your physician wants you to follow-up in: one year with Dr. [name].   You will receive a reminder letter in the mail two months in advance. If you don't receive a letter, please call our office to schedule the follow-up appointment.  Remote monitoring is used to monitor your ICD from home. This monitoring reduces the number of office visits required to check your device to one time per year. It allows [place] to keep an eye on the functioning of your device to ensure it is working properly. You are scheduled for a device check from home on 09/22/2018. You may send your transmission at any time that day. If you have a wireless device, the transmission will be sent automatically. After your physician reviews your transmission, you will receive a postcard with your next transmission date.  Any Other Special Instructions Will Be Listed Below (If Applicable).  If you need a refill on your cardiac medications before your next appointment, please call your pharmacy.

## 2018-07-30 NOTE — Progress Notes (Signed)
HPI Mr. Beller returns today for followup of his ICD and VT. He has an ICM and is s/p AVR. In the interim, he has done well with no chest pain or sob. He has class 2 dyspnea.  No Known Allergies   Current Outpatient Medications  Medication Sig Dispense Refill  . amiodarone (PACERONE) 200 MG tablet TAKE 1 TABLET BY MOUTH  DAILY 90 tablet 3  . amLODipine (NORVASC) 10 MG tablet Take 1 tablet (10 mg total) by mouth daily. 90 tablet 3  . aspirin 81 MG EC tablet Take 1 tablet (81 mg total) by mouth daily.    [name] atorvastatin (LIPITOR) 80 MG tablet Take 1 tablet (80 mg total) by mouth daily. 90 tablet 3  . cilostazol (PLETAL) 100 MG tablet Take 1 tablet (100 mg total) by mouth 2 (two) times daily. 180 tablet 3  . clopidogrel (PLAVIX) 75 MG tablet TAKE 1 TABLET BY MOUTH  DAILY 90 tablet 3  . ezetimibe (ZETIA) 10 MG tablet TAKE 1 TABLET BY MOUTH  DAILY 90 tablet 3  . fish oil-omega-3 fatty acids 1000 MG capsule Take 1 g by mouth daily.     [name] levothyroxine (SYNTHROID, LEVOTHROID) 50 MCG tablet Take 50 mcg by mouth daily before breakfast.    . lisinopril (PRINIVIL,ZESTRIL) 10 MG tablet Take 1 tablet (10 mg total) by mouth daily. Please keep upcoming appt in February with Dr. [name] for future refills. Thank you 90 tablet 0  . Multiple Vitamins-Minerals (MULTIVITAMIN WITH MINERALS) tablet Take 1 tablet by mouth daily.    . vitamin E 400 UNIT capsule Take 2 capsules by mouth daily.     Current Facility-Administered Medications  Medication Dose Route Frequency Provider Last Rate Last Dose  . 0.9 %  sodium chloride infusion  500 mL Intravenous Continuous Pyrtle, Lajuan Lines, MD         Past Medical History:  Diagnosis Date  . Bradycardia   . CAD S/P percutaneous coronary angioplasty 1993   POBA to Cx x 2 occasions: 10/2015:Mod (mod-severe CAD): p-mCx 50%. mLAD ~40%, mRCA ~40%. dRCA ~70%, No culprit lesion for VT.  [name] Dyslipidemia, goal LDL below 70   . Former heavy tobacco smoker     quit in  October 2014  . GERD (gastroesophageal reflux disease)   . Hypertension   . Olecranon bursitis of left elbow   . Osteoarthritis   . Peripheral vascular disease ([place])     H/O R SFA stent, L Iliac Stent 1996;;  Lower Extremity Angiography April 2015: Occluded left SFA reconstituting at end of the canal, heavily calcified. Three-vessel runoff..;; RCA 50% ostial right common iliac, patent SFA stent -> Referred to Dr. Trula Slade of [facility].  . Postoperative atrial fibrillation (H[facility])    No recurrence  . S/P AVR (aortic valve replacement) 02/10/2013   23 Edwards Magna Ease Pericardial Tissue Valve  . Severe aortic stenosis by prior echocardiogram    per Dr. Rollene Fare 2012; referred for aortic valve replacement  . Shortness of breath    Chronic  . Trigger ring finger of left hand   . Tubular adenoma of colon   . VT (ventricular tachycardia) ([place])    hemodynamically significant VT on 11/07/2015, nonobstructive CAD, no culprit lesion, underwent St Jude Dual Chamber ICD by Dr. [name]    ROS:   All systems reviewed and negative except as noted in the HPI.   Past Surgical History:  Procedure Laterality Date  . ABDOMINAL ANGIOGRAM  09/10/2013  Procedure: ABDOMINAL ANGIOGRAM;  Surgeon: [name], MD;  Location: [facility] CATH LAB;  Service: Cardiovascular;;  . AORTIC VALVE REPLACEMENT N/A 02/10/2013   AORTIC VALVE REPLACEMENT (AVR);  Surgeon: Grace Isaac, MD;  Location: [facility] OR;  Service: Open Heart Surgery:  Dr. Servando Snare: [address] Ease Pericardia  . ARTERIAL DOPPLER - PRE-CABG  02/06/2013   Bilateral 1-39% ICA stenosis  . CARDIAC CATHETERIZATION  August 2014   Nonobstructive 30-50% RCA. No significant LAD disease. 30% mid circumflex.  [name] CARDIAC CATHETERIZATION  11/10/2009   No intervention - recommend medical management  . CARDIAC CATHETERIZATION N/A 11/09/2015   Procedure: Left Heart Cath and Coronary Angiography;  Surgeon: Belva Crome, MD;  Location: [facility] INVASIVE CV LAB: Mod (mod-severe  CAD): p-mCx 50%. mLAD ~40%, mRCA ~40%. dRCA ~70%, No culprit lesion for VT.  [name] CARDIOVASCULAR STRESS TEST  06/24/2012   Diffuse LV hypokinesis with moderately depressed systolic function and EF 09%,  . CORONARY ANGIOPLASTY  1993   Circumflex x 2   . EP IMPLANTABLE DEVICE N/A 11/10/2015   Procedure: ICD Implant;  Surgeon: Evans Lance, MD;  Location: [place] CV LAB;  Service: Cardiovascular;  Laterality: N/A;  . ICD GENERATOR CHANGEOUT N/A 05/31/2016   Procedure: ICD Generator Changeout;  Surgeon: Thompson Grayer, MD;  Location: [place] CV LAB;  Service: Cardiovascular;  Laterality: N/A;  . ILIAC ARTERY STENT Left 1996  . INTRAOPERATIVE TRANSESOPHAGEAL ECHOCARDIOGRAM N/A 02/10/2013   Procedure: INTRAOPERATIVE TRANSESOPHAGEAL ECHOCARDIOGRAM;  Surgeon: Grace Isaac, MD;  Location: [place];  Service: Open Heart Surgery;  Laterality: N/A;  . LEFT AND RIGHT HEART CATHETERIZATION WITH CORONARY ANGIOGRAM N/A 01/28/2013   Procedure: LEFT AND RIGHT HEART CATHETERIZATION WITH CORONARY ANGIOGRAM;  Surgeon: Leonie Man, MD;  Location: [facility] CATH LAB;  Service: Cardiovascular;  Laterality: N/A;  . LOWER EXTREMITY ANGIOGRAM N/A 09/10/2013   Procedure: LOWER EXTREMITY ANGIOGRAM;  Surgeon: [name], MD;  Location: [facility] CATH LAB;  Service: Cardiovascular;  Laterality: N/A;  . OLECRANON BURSECTOMY  07/23/2011   Procedure: OLECRANON BURSA;  Surgeon: Lorn Junes, MD;  Location: [place];  Service: Orthopedics;  Laterality: Left;  excision olecranon bursa left elbow patient had supraclaviular block in preop  . PERIPHERAL VASCULAR ANGIOGRAM  07/19/1994   No intervention-recommend medical therapy  . PERIPHERAL VASCULAR ANGIOGRAM  01/17/1995   Right SFA 80-90# lesion, dilatation performed with a Cordis "Optz 5" 62mm x 2cm, inflated a 4-7.5atm for 40 to 70 seconds. Completion angiogram done by hand with "bolus chase" runoff from Primal SFA to foot. [place] in reduction of 80-90% to 20% with  excellent flow and without dissection  . PERIPHERAL VASCULAR ANGIOGRAM  09/10/2013   Occluded left SFA, heavily calcified with reconstitution at adductor canal. Three-vessel runof.;; Right lower extremity noted 50% ostial right common iliac stenosis with roughly 20 mm gradient. Not considered significant.f  . right hand surgery    . ROTATOR CUFF REPAIR Right   . Superficial Femoral Atery stent Right 1996  . TONSILLECTOMY     as child  . TRANSTHORACIC ECHOCARDIOGRAM  03/23/2013   Post-Op Echo #1: EF 50-55%, Mild Conc LVH, Mild HK of Inferolateral wall with paradoxical septal motion (c/w post-op state); Well seated 23 mm Bioprosthetic Valve (area ~1.23 cm2 - normal for valve); Mild MR  . TRANSTHORACIC ECHOCARDIOGRAM  March 2017   EF 50-55%. No RWMA.  GR 1 DD.  23 mm bioprosthetic valve functioning normally.  Mild biatrial dilation.  [name]  TRANSTHORACIC ECHOCARDIOGRAM  08/2017   EF 50 and 55% with moderate LVH.  No regional wall motion normalities.  GR 1 DD.  Bioprosthetic aortic valve present -normal gradients.  [name] TRIGGER FINGER RELEASE  07/23/2011   Procedure: RELEASE TRIGGER FINGER/A-1 PULLEY;  Surgeon: Lorn Junes, MD;  Location: [place];  Service: Orthopedics;  Laterality: Left;  release trigger left ring finger patient had supraclavicular block in preop     Family History  Problem Relation Age of Onset  . Hypertension Mother   . Cancer Mother        Oral  . Stroke Father      Social History   Socioeconomic History  . Marital status: Married    Spouse name: Not on file  . Number of children: Not on file  . Years of education: Not on file  . Highest education level: Not on file  Occupational History  . Not on file  Social Needs  . Financial resource strain: Not on file  . Food insecurity:    Worry: Not on file    Inability: Not on file  . Transportation needs:    Medical: Not on file    Non-medical: Not on file  Tobacco Use  . Smoking status: Former  Smoker    Packs/day: 0.50    Years: 60.00    Pack years: 30.00    Types: Cigarettes    Last attempt to quit: 01/31/2013    Years since quitting: 5.4  . Smokeless tobacco: Never Used  Substance and Sexual Activity  . Alcohol use: No    Alcohol/week: 0.0 standard drinks  . Drug use: No  . Sexual activity: Not on file  Lifestyle  . Physical activity:    Days per week: Not on file    Minutes per session: Not on file  . Stress: Not on file  Relationships  . Social connections:    Talks on phone: Not on file    Gets together: Not on file    Attends religious service: Not on file    Active member of club or organization: Not on file    Attends meetings of clubs or organizations: Not on file    Relationship status: Not on file  . Intimate partner violence:    Fear of current or ex partner: Not on file    Emotionally abused: Not on file    Physically abused: Not on file    Forced sexual activity: Not on file  Other Topics Concern  . Not on file  Social History Narrative   Married with no children. He long-term smoker who quit prior to his aVR in 2014.    Up until his operation he worked part-time at [employer].  He is not yet gone back to work postoperatively.   He chose not be cardiac rehabilitation, but has gone back to walking exercising daily.     BP (!) 150/80   Pulse 70   Ht 5\' 5"  (1.651 m)   Wt 154 lb (69.9 kg)   BMI 25.63 kg/m   Physical Exam:  Well appearing NAD HEENT: Unremarkable Neck:  No JVD, no thyromegally Lymphatics:  No adenopathy Back:  No CVA tenderness Lungs:  Clear with no wheezes HEART:  Regular rate rhythm, no murmurs, no rubs, no clicks Abd:  soft, positive bowel sounds, no organomegally, no rebound, no guarding Ext:  2 plus pulses, no edema, no cyanosis, no clubbing Skin:  No rashes no nodules Neuro:  CN II through XII intact, motor grossly intact  EKG - nsr with RBBB  DEVICE  Normal device function.  See PaceArt for details.    Assess/Plan: 1. VT - he is well controlled on amiodarone. He will continue. I might reduce his dose when I see him next if he has not had any more VT. 2. Chronic systolic heart failure - his symptoms are class 2. He will continue his current meds. 3. CAD - he denies anginal symptoms. He will continue his current meds. 4. Dyslipidemia - he has been on statin therapy and zetia. Continue both.   Mikle Bosworth.D.

## 2018-08-01 LAB — CUP PACEART INCLINIC DEVICE CHECK
Implantable Lead Implant Date: 20170601
Implantable Lead Location: 753859
Implantable Lead Model: 7122
Implantable Pulse Generator Implant Date: 20171221
MDC IDC LEAD IMPLANT DT: 20170601
MDC IDC LEAD LOCATION: 753860
MDC IDC PG SERIAL: 7390376
MDC IDC SESS DTM: 20200221110834

## 2018-08-07 ENCOUNTER — Other Ambulatory Visit: Payer: Self-pay | Admitting: Internal Medicine

## 2018-08-07 ENCOUNTER — Other Ambulatory Visit: Payer: Self-pay | Admitting: Cardiology

## 2018-08-07 NOTE — Telephone Encounter (Signed)
Rx(s) sent to pharmacy electronically.  

## 2018-09-22 ENCOUNTER — Other Ambulatory Visit: Payer: Self-pay

## 2018-09-22 ENCOUNTER — Ambulatory Visit (INDEPENDENT_AMBULATORY_CARE_PROVIDER_SITE_OTHER): Payer: Medicare Other | Admitting: *Deleted

## 2018-09-22 DIAGNOSIS — I428 Other cardiomyopathies: Secondary | ICD-10-CM

## 2018-09-22 DIAGNOSIS — I472 Ventricular tachycardia, unspecified: Secondary | ICD-10-CM

## 2018-09-22 LAB — CUP PACEART REMOTE DEVICE CHECK
Battery Remaining Longevity: 68 mo
Battery Remaining Percentage: 75 %
Battery Voltage: 2.96 V
Brady Statistic AP VP Percent: 6.6 %
Brady Statistic AP VS Percent: 84 %
Brady Statistic AS VP Percent: 1 %
Brady Statistic AS VS Percent: 9.4 %
Brady Statistic RA Percent Paced: 90 %
Brady Statistic RV Percent Paced: 6.7 %
Date Time Interrogation Session: 20200413070015
HighPow Impedance: 56 Ohm
HighPow Impedance: 56 Ohm
Implantable Lead Implant Date: 20170601
Implantable Lead Implant Date: 20170601
Implantable Lead Location: 753859
Implantable Lead Location: 753860
Implantable Lead Model: 7122
Implantable Pulse Generator Implant Date: 20171221
Lead Channel Impedance Value: 380 Ohm
Lead Channel Impedance Value: 440 Ohm
Lead Channel Pacing Threshold Amplitude: 0.75 V
Lead Channel Pacing Threshold Amplitude: 0.75 V
Lead Channel Pacing Threshold Pulse Width: 0.5 ms
Lead Channel Pacing Threshold Pulse Width: 0.5 ms
Lead Channel Sensing Intrinsic Amplitude: 2.4 mV
Lead Channel Sensing Intrinsic Amplitude: 3.2 mV
Lead Channel Setting Pacing Amplitude: 1 V
Lead Channel Setting Pacing Amplitude: 1.75 V
Lead Channel Setting Pacing Pulse Width: 0.5 ms
Lead Channel Setting Sensing Sensitivity: 0.5 mV
Pulse Gen Serial Number: 7390376

## 2018-10-02 NOTE — Progress Notes (Signed)
Remote ICD transmission.   

## 2018-10-05 ENCOUNTER — Other Ambulatory Visit: Payer: Self-pay | Admitting: Cardiology

## 2018-10-06 NOTE — Telephone Encounter (Signed)
Amiodarone 200 mg refilled.

## 2018-10-10 ENCOUNTER — Ambulatory Visit: Payer: Medicare Other | Admitting: Cardiology

## 2018-11-25 ENCOUNTER — Other Ambulatory Visit (HOSPITAL_COMMUNITY): Payer: Self-pay | Admitting: Cardiology

## 2018-11-25 DIAGNOSIS — Z95820 Peripheral vascular angioplasty status with implants and grafts: Secondary | ICD-10-CM

## 2018-11-25 DIAGNOSIS — I739 Peripheral vascular disease, unspecified: Secondary | ICD-10-CM

## 2018-11-28 ENCOUNTER — Ambulatory Visit (HOSPITAL_BASED_OUTPATIENT_CLINIC_OR_DEPARTMENT_OTHER)
Admission: RE | Admit: 2018-11-28 | Discharge: 2018-11-28 | Disposition: A | Discharge status: Home / Self Care | Payer: Medicare Other | Source: Ambulatory Visit | Attending: Cardiology | Admitting: Cardiology

## 2018-11-28 ENCOUNTER — Ambulatory Visit (HOSPITAL_COMMUNITY)
Admission: RE | Admit: 2018-11-28 | Discharge: 2018-11-28 | Disposition: A | Discharge status: Home / Self Care | Payer: Medicare Other | Source: Ambulatory Visit | Attending: Cardiology | Admitting: Cardiology

## 2018-11-28 ENCOUNTER — Other Ambulatory Visit (HOSPITAL_COMMUNITY): Payer: Self-pay | Admitting: Cardiology

## 2018-11-28 ENCOUNTER — Other Ambulatory Visit: Payer: Self-pay | Admitting: Cardiology

## 2018-11-28 ENCOUNTER — Other Ambulatory Visit: Payer: Self-pay

## 2018-11-28 DIAGNOSIS — I739 Peripheral vascular disease, unspecified: Secondary | ICD-10-CM | POA: Insufficient documentation

## 2018-11-28 DIAGNOSIS — Z9862 Peripheral vascular angioplasty status: Secondary | ICD-10-CM

## 2018-11-28 DIAGNOSIS — Z95820 Peripheral vascular angioplasty status with implants and grafts: Secondary | ICD-10-CM

## 2018-12-16 ENCOUNTER — Telehealth: Payer: Self-pay | Admitting: *Deleted

## 2018-12-16 NOTE — Telephone Encounter (Signed)
Left message to call back - LEA DOPPLERS RESULTS

## 2018-12-16 NOTE — Telephone Encounter (Signed)
The patient has been notified of the result and verbalized understanding.  All questions (if any) were answered. F/u 02/10/19 Raiford Simmonds, RN 12/16/2018 4:45 PM

## 2018-12-16 NOTE — Telephone Encounter (Signed)
-----   Message from Leonie Man, MD sent at 12/16/2018 12:32 AM EDT ----- ABI showed moderate disease in the lower extremities.  Arterial ultrasound shows moderate disease in the right common femoral and superficial femoral artery with significant disease in the mid vessel. -->  Will need to assess claudication symptoms, and if present, may need to discuss going back to vascular.  Glenetta Hew, MD  pls fwd to PCP: [name], MD

## 2018-12-22 ENCOUNTER — Ambulatory Visit (INDEPENDENT_AMBULATORY_CARE_PROVIDER_SITE_OTHER): Payer: Medicare Other | Admitting: *Deleted

## 2018-12-22 DIAGNOSIS — I472 Ventricular tachycardia, unspecified: Secondary | ICD-10-CM

## 2018-12-22 DIAGNOSIS — I4891 Unspecified atrial fibrillation: Secondary | ICD-10-CM

## 2018-12-22 DIAGNOSIS — I9789 Other postprocedural complications and disorders of the circulatory system, not elsewhere classified: Secondary | ICD-10-CM | POA: Diagnosis not present

## 2018-12-22 LAB — CUP PACEART REMOTE DEVICE CHECK
Battery Remaining Longevity: 66 mo
Battery Remaining Percentage: 72 %
Battery Voltage: 2.96 V
Brady Statistic AP VP Percent: 7.8 %
Brady Statistic AP VS Percent: 82 %
Brady Statistic AS VP Percent: 1 %
Brady Statistic AS VS Percent: 10 %
Brady Statistic RA Percent Paced: 89 %
Brady Statistic RV Percent Paced: 7.8 %
Date Time Interrogation Session: 20200713120640
HighPow Impedance: 68 Ohm
HighPow Impedance: 68 Ohm
Implantable Lead Implant Date: 20170601
Implantable Lead Implant Date: 20170601
Implantable Lead Location: 753859
Implantable Lead Location: 753860
Implantable Lead Model: 7122
Implantable Pulse Generator Implant Date: 20171221
Lead Channel Impedance Value: 430 Ohm
Lead Channel Impedance Value: 450 Ohm
Lead Channel Pacing Threshold Amplitude: 0.625 V
Lead Channel Pacing Threshold Amplitude: 0.875 V
Lead Channel Pacing Threshold Pulse Width: 0.5 ms
Lead Channel Pacing Threshold Pulse Width: 0.5 ms
Lead Channel Sensing Intrinsic Amplitude: 3.4 mV
Lead Channel Sensing Intrinsic Amplitude: 3.7 mV
Lead Channel Setting Pacing Amplitude: 1.125
Lead Channel Setting Pacing Amplitude: 1.625
Lead Channel Setting Pacing Pulse Width: 0.5 ms
Lead Channel Setting Sensing Sensitivity: 0.5 mV
Pulse Gen Serial Number: 7390376

## 2018-12-31 NOTE — Progress Notes (Signed)
Remote ICD transmission.   

## 2019-01-16 ENCOUNTER — Other Ambulatory Visit: Payer: Self-pay | Admitting: Cardiology

## 2019-01-16 DIAGNOSIS — E785 Hyperlipidemia, unspecified: Secondary | ICD-10-CM

## 2019-02-10 ENCOUNTER — Ambulatory Visit: Payer: Medicare Other | Admitting: Cardiology

## 2019-02-11 ENCOUNTER — Ambulatory Visit (INDEPENDENT_AMBULATORY_CARE_PROVIDER_SITE_OTHER): Payer: Medicare Other | Admitting: Cardiology

## 2019-02-11 ENCOUNTER — Other Ambulatory Visit: Payer: Self-pay

## 2019-02-11 ENCOUNTER — Encounter: Payer: Self-pay | Admitting: Cardiology

## 2019-02-11 VITALS — BP 136/78 | HR 60 | Ht 65.0 in | Wt 150.6 lb

## 2019-02-11 DIAGNOSIS — E785 Hyperlipidemia, unspecified: Secondary | ICD-10-CM | POA: Diagnosis not present

## 2019-02-11 DIAGNOSIS — Z9861 Coronary angioplasty status: Secondary | ICD-10-CM | POA: Diagnosis not present

## 2019-02-11 DIAGNOSIS — I4891 Unspecified atrial fibrillation: Secondary | ICD-10-CM

## 2019-02-11 DIAGNOSIS — I472 Ventricular tachycardia, unspecified: Secondary | ICD-10-CM

## 2019-02-11 DIAGNOSIS — I10 Essential (primary) hypertension: Secondary | ICD-10-CM

## 2019-02-11 DIAGNOSIS — I70219 Atherosclerosis of native arteries of extremities with intermittent claudication, unspecified extremity: Secondary | ICD-10-CM | POA: Diagnosis not present

## 2019-02-11 DIAGNOSIS — Z952 Presence of prosthetic heart valve: Secondary | ICD-10-CM

## 2019-02-11 DIAGNOSIS — I2510 Atherosclerotic heart disease of native coronary artery without angina pectoris: Secondary | ICD-10-CM | POA: Diagnosis not present

## 2019-02-11 DIAGNOSIS — I9789 Other postprocedural complications and disorders of the circulatory system, not elsewhere classified: Secondary | ICD-10-CM

## 2019-02-11 NOTE — Patient Instructions (Signed)
Medication Instructions:   NO CHANGES    If you need a refill on your cardiac medications before your next appointment, please call your pharmacy.   Lab work: NOT NEEDED   Testing/Procedures: NOT NEEDED  Follow-Up: At [facility], you and your health needs are our priority.  As part of our continuing mission to provide you with exceptional heart care, we have created designated Provider Care Teams.  These Care Teams include your primary Cardiologist (physician) and Advanced Practice Providers (APPs -  Physician Assistants and Nurse Practitioners) who all work together to provide you with the care you need, when you need it. You will need a follow up appointment in  12 months-SEPT 2021 - DR HARDING .  Please call our office 2 months in advance to schedule this appointment.    Your physician recommends that you schedule a follow-up appointment DR BERRY - DISCUSS CLAUDICATION AND LAST DOPPLER IN June 2020  Your physician recommends that you schedule a follow-up appointment in [place]   Any Other Special Instructions Will Be Listed Below (If Applicable).

## 2019-02-11 NOTE — Progress Notes (Signed)
PCP: [name], MD  Vasc Sgx: Dr. Trula Slade of Vasc Sgx EP: Dr. [name]   Clinic Note: Chief Complaint  Patient presents with   Follow-up   Cardiac Valve Problem    Status post aVR   Claudication   Coronary Artery Disease    HPI: Ralph Drake is a 84 y.o. male with a PMH notable for moderate to severe CAD and PAD (limiting claudication) as well as ICM (s/p ICD for sustained VT) with history of AVR with postop.  He is presenting now for annual follow-up  CAD (coronary angioplasty x 2 1993)  Cath May 2017: Mod (mod-severe CAD): p-mCx 50%. mLAD ~40%, mRCA ~40%. dRCA ~70%, No culprit lesion for VT.  PAD (SFA stent 1996, SFA occlusion 2015) ,   September 04, 2017 ABIs: (Right 0.74, left 0.70) right ABIs decreased compared to 2014 but left is stable. --Indicates moderate bilateral lower extremity arterial disease. -->  Duplex: Hard plaque throughout. ->  Abdominal aortic duplex was recommended. (Abdominal duplex was inadequate)  H/o AS: s/p AVR (23 [facility] Ease Bovine Pericardial Tissue Valve 2014) with Cardiomyopathy (mixed Valvular & Ischemic).   2D Echo August 22, 2017: EF 50 and 55% with moderate LVH.  No regional wall motion normalities.  GR 1 DD.  Bioprosthetic aortic valve present -normal gradients.  Recurrent VT -- s/p ICD - multiple shocks -- > ICD Implant 11/10/2015 (Dr. [name]) - St. Jude ICD L Ax. V.; readmitted for frequent VT - new ICD  re-implant for device failure 05/2016. (Dr. [name])  Postop atrial fib -on amiodarone.  Not on Devol. -Was only noted postop  He has known solitary kidney - CKD III along with HTN, HLD.   Ralph Drake was last seen in October 2019 - doing well.  Limiting claudication.  He was also just seen by Dr. Crissie Sickles in February 2020.  Doing well.  Class II DOE. No CP. No syncope no ICD shocks.  - continue Amiodarone.   Recent Hospitalizations: None  Studies Personally Reviewed - (if available, images/films reviewed: From Epic  Chart or Care Everywhere)  Abd-Ao-LEA Dopplers (reviewed)  Left: ABI Moderate  Right: 50-74% stenosis noted in the distal common femoral artery. 50-74% stenosis noted in the ostial superficial femoral artery, s/p PTA. 75-99% stenosis in the mid SFA, s/p PTA. 30-49% stenosis in the mid/distal SFA, s/p PTA  Interval History: Ralph Drake presents today stating that he is doing pretty well overall from cardiac standpoint.  The biggest issue he has is that he walks more than 7500 yards and knee will deftly started noticing pain in his calves.  Right is definitely worse than the left right now but he really notes it on both legs.  He stops to rest and it improves, then he will go back to walking.  He says if he is doing yard work or carrying the blower on his back for instance, he may get short of breath if he is covering a large area of long, but otherwise he is not noticing any exertional dyspnea or chest pain/pressure.  He probably does not walk enough to really evaluate for exertional dyspnea or chest pain.  He denies any rapid irregular heartbeats or palpitations.  No syncope/near syncope or TIA shows amaurosis fugax. No heart failure symptoms of PND, orthopnea or edema.  He has been reluctant to discuss changing medications in the past and does not want to discuss lipid control.  At 84 years old, he is not interested in adding  medicines.   ROS: A comprehensive was performed. Review of Systems  Constitutional: Negative for malaise/fatigue and weight loss.  HENT: Negative for congestion.   Eyes: Negative.   Respiratory: Positive for shortness of breath (Baseline). Negative for cough and sputum production.   Cardiovascular: Positive for claudication.  Gastrointestinal: Negative for heartburn and nausea.  Genitourinary: Negative for dysuria and flank pain.  Musculoskeletal: Positive for joint pain (Normal arthritis pains.). Negative for back pain and myalgias.  Neurological: Positive for dizziness  (Occasional orthostatic dizziness at night - ).  Endo/Heme/Allergies: Negative for environmental allergies.  Psychiatric/Behavioral: Positive for memory loss.  All other systems reviewed and are negative.   I have reviewed and (if needed) personally updated the patient's problem list, medications, allergies, past medical and surgical history, social and family history.   Past Medical History:  Diagnosis Date   Bradycardia    CAD S/P percutaneous coronary angioplasty 1993   POBA to Cx x 2 occasions: 10/2015:Mod (mod-severe CAD): p-mCx 50%. mLAD ~40%, mRCA ~40%. dRCA ~70%, No culprit lesion for VT.   Dyslipidemia, goal LDL below 70    Former heavy tobacco smoker     quit in October 2014   GERD (gastroesophageal reflux disease)    Hypertension    Olecranon bursitis of left elbow    Osteoarthritis    Peripheral vascular disease ([place])     H/O R SFA stent, L Iliac Stent 1996;;  Lower Extremity Angiography April 2015: Occluded left SFA reconstituting at end of the canal, heavily calcified. Three-vessel runoff..;; RCA 50% ostial right common iliac, patent SFA stent -> Referred to Dr. Trula Slade of Vasc Sgx.   Postoperative atrial fibrillation (HCC)    No recurrence   S/P AVR (aortic valve replacement) 02/10/2013   23 [employer] Ease Pericardial Tissue Valve   Severe aortic stenosis by prior echocardiogram    per Dr. Rollene Fare 2012; referred for aortic valve replacement   Shortness of breath    Chronic   Trigger ring finger of left hand    Tubular adenoma of colon    VT (ventricular tachycardia) ([place])    hemodynamically significant VT on 11/07/2015, nonobstructive CAD, no culprit lesion, underwent St Jude Dual Chamber ICD by Dr. [name]    Past Surgical History:  Procedure Laterality Date   ABDOMINAL ANGIOGRAM  09/10/2013   Procedure: ABDOMINAL ANGIOGRAM;  Surgeon: [name], MD;  Location: [facility] CATH LAB;  Service: Cardiovascular;;   AORTIC VALVE REPLACEMENT N/A  02/10/2013   AORTIC VALVE REPLACEMENT (AVR);  Surgeon: Grace Isaac, MD;  Location: [facility] OR;  Service: Open Heart Surgery:  Dr. Servando Snare: [address] Ease Pericardia   ARTERIAL DOPPLER - PRE-CABG  02/06/2013   Bilateral 1-39% ICA stenosis   CARDIAC CATHETERIZATION  August 2014   Nonobstructive 30-50% RCA. No significant LAD disease. 30% mid circumflex.   CARDIAC CATHETERIZATION  11/10/2009   No intervention - recommend medical management   CARDIAC CATHETERIZATION N/A 11/09/2015   Procedure: Left Heart Cath and Coronary Angiography;  Surgeon: Belva Crome, MD;  Location: [facility] INVASIVE CV LAB: Mod (mod-severe CAD): p-mCx 50%. mLAD ~40%, mRCA ~40%. dRCA ~70%, No culprit lesion for VT.   CARDIOVASCULAR STRESS TEST  06/24/2012   Diffuse LV hypokinesis with moderately depressed systolic function and EF 10%,   CORONARY ANGIOPLASTY  1993   Circumflex x 2    EP IMPLANTABLE DEVICE N/A 11/10/2015   Procedure: ICD Implant;  Surgeon: Evans Lance, MD;  Location: [place] CV LAB;  Service: Cardiovascular;  Laterality: N/A;   ICD GENERATOR CHANGEOUT N/A 05/31/2016   Procedure: ICD Generator Changeout;  Surgeon: Thompson Grayer, MD;  Location: [place] CV LAB;  Service: Cardiovascular;  Laterality: N/A;   ILIAC ARTERY STENT Left 1996   INTRAOPERATIVE TRANSESOPHAGEAL ECHOCARDIOGRAM N/A 02/10/2013   Procedure: INTRAOPERATIVE TRANSESOPHAGEAL ECHOCARDIOGRAM;  Surgeon: Grace Isaac, MD;  Location: [place];  Service: Open Heart Surgery;  Laterality: N/A;   LEFT AND RIGHT HEART CATHETERIZATION WITH CORONARY ANGIOGRAM N/A 01/28/2013   Procedure: LEFT AND RIGHT HEART CATHETERIZATION WITH CORONARY ANGIOGRAM;  Surgeon: Leonie Man, MD;  Location[facility] C[facility] LAB;  Service: Cardiovascular;  Laterality: N/A;   LOWER EXTREMITY ANGIOGRAM N/A 09/10/2013   Procedure: LOWER EXTREMITY ANGIOGRAM;  Surgeon: [name], MD;  Location: [facility] CATH LAB;  Service: Cardiovascular;  Laterality: N/A;   OLECRANON  BURSECTOMY  07/23/2011   Procedure: OLECRANON BURSA;  Surgeon: Lorn Junes, MD;  Location: [place];  Service: Orthopedics;  Laterality: Left;  excision olecranon bursa left elbow patient had supraclaviular block in preop   PERIPHERAL VASCULAR ANGIOGRAM  07/19/1994   No intervention-recommend medical therapy   PERIPHERAL VASCULAR ANGIOGRAM  01/17/1995   Right SFA 80-90# lesion, dilatation performed with a Cordis "Optz 5" 77mm x 2cm, inflated a 4-7.5atm for 40 to 70 seconds. Completion angiogram done by hand with "bolus chase" runoff from Primal SFA to foot. [place]ling in reduction of 80-90% to 20% with excellent flow and without dissection   PERIPHERAL VASCULAR ANGIOGRAM  09/10/2013   Occluded left SFA, heavily calcified with reconstitution at adductor canal. Three-vessel runof.;; Right lower extremity noted 50% ostial right common iliac stenosis with roughly 20 mm gradient. Not considered significant.f   right hand surgery     ROTATOR CUFF REPAIR Right    Superficial Femoral Atery stent Right 1996   TONSILLECTOMY     as child   TRANSTHORACIC ECHOCARDIOGRAM  03/23/2013   Post-Op Echo #1: EF 50-55%, Mild Conc LVH, Mild HK of Inferolateral wall with paradoxical septal motion (c/w post-op state); Well seated 23 mm Bioprosthetic Valve (area ~1.23 cm2 - normal for valve); Mild MR   TRANSTHORACIC ECHOCARDIOGRAM  March 2017   EF 50-55%. No RWMA.  GR 1 DD.  23 mm bioprosthetic valve functioning normally.  Mild biatrial dilation.   TRANSTHORACIC ECHOCARDIOGRAM  08/2017   EF 50 and 55% with moderate LVH.  No regional wall motion normalities.  GR 1 DD.  Bioprosthetic aortic valve present -normal gradients.   TRIGGER FINGER RELEASE  07/23/2011   Procedure: RELEASE TRIGGER FINGER/A-1 PULLEY;  Surgeon: Lorn Junes, MD;  Location: [place];  Service: Orthopedics;  Laterality: Left;  release trigger left ring finger patient had supraclavicular block in preop     Current Meds  Medication Sig   amiodarone (PACERONE) 200 MG tablet TAKE 1 TABLET BY MOUTH  DAILY   amLODipine (NORVASC) 10 MG tablet TAKE 1 TABLET BY MOUTH  DAILY   aspirin 81 MG EC tablet Take 1 tablet (81 mg total) by mouth daily.   atorvastatin (LIPITOR) 80 MG tablet TAKE 1 TABLET BY MOUTH  DAILY   cilostazol (PLETAL) 100 MG tablet TAKE 1 TABLET BY MOUTH TWO  TIMES DAILY   clopidogrel (PLAVIX) 75 MG tablet Take 1 tablet (75 mg total) by mouth daily.   ezetimibe (ZETIA) 10 MG tablet Take 1 tablet (10 mg total) by mouth daily.   fish oil-omega-3 fatty acids 1000 MG capsule Take 1 g  by mouth daily.    levothyroxine (SYNTHROID, LEVOTHROID) 50 MCG tablet Take 50 mcg by mouth daily before breakfast.   lisinopril (PRINIVIL,ZESTRIL) 10 MG tablet TAKE 1 TABLET BY MOUTH  DAILY.   Multiple Vitamins-Minerals (MULTIVITAMIN WITH MINERALS) tablet Take 1 tablet by mouth daily.   vitamin E 400 UNIT capsule Take 2 capsules by mouth daily.   Current Facility-Administered Medications for the 02/11/19 encounter (Office Visit) with Leonie Man, MD  Medication   0.9 %  sodium chloride infusion    No Known Allergies  Social History   Tobacco Use   Smoking status: Former Smoker    Packs/day: 0.50    Years: 60.00    Pack years: 30.00    Types: Cigarettes    Quit date: 01/31/2013    Years since quitting: 6.0   Smokeless tobacco: Never Used  Substance Use Topics   Alcohol use: No    Alcohol/week: 0.0 standard drinks   Drug use: No   Social History   Social History Narrative   Married with no children. He long-term smoker who quit prior to his aVR in 2014.    Up until his operation he worked part-time at [employer].  He is not yet gone back to work postoperatively.   He chose not be cardiac rehabilitation, but has gone back to walking exercising daily.    family history includes Cancer in his mother; Hypertension in his mother; Stroke in his father.  Wt  Readings from Last 3 Encounters:  02/11/19 150 lb 9.6 oz (68.3 kg)  07/30/18 154 lb (69.9 kg)  04/09/18 150 lb 8 oz (68.3 kg)    PHYSICAL EXAM BP 136/78    Pulse 60    Ht 5\' 5"  (1.651 m)    Wt 150 lb 9.6 oz (68.3 kg)    SpO2 91%    BMI 25.06 kg/m  Physical Exam  Constitutional: He is oriented to person, place, and time. He appears well-developed and well-nourished. No distress.  Healthy appearing  HENT:  Head: Normocephalic and atraumatic.  Neck: Normal range of motion. Neck supple. No hepatojugular reflux and no JVD present. Carotid bruit is present (R>L).  Cardiovascular: Normal rate and regular rhythm.  Occasional extrasystoles are present. PMI is not displaced. Exam reveals decreased pulses (Bilateral AT pulses decreased ). Exam reveals no gallop and no friction rub.  Murmur (Soft 1/6 SEM at RUSB.) heard. Bilateral pedal pulses are palpable, but diminished.  Pulmonary/Chest: Effort normal. No respiratory distress. He has no wheezes. He exhibits no tenderness.  Mild diffuse crackles -mostly noted in left lower lung field.  No rales or rhonchi.  Abdominal: Soft. Bowel sounds are normal. He exhibits no distension. There is no abdominal tenderness. There is no rebound.  Musculoskeletal: Normal range of motion.        General: No edema.  Neurological: He is alert and oriented to person, place, and time.  Psychiatric: He has a normal mood and affect. His behavior is normal. Thought content normal.  Nursing note and vitals reviewed.    Adult ECG Report Mostly A-paced V Sensed rhythm with intermittent normal beats.  RBBB, LAFB (Bifascicular block). -- stable   Other studies Reviewed: Additional studies/ records that were reviewed today include:  Recent Labs: March 2020 Na+ 136, K+ 5.1, Cl- 102, HCO3-22.8, BUN 29, Cr 1.7, Glu 95, Ca2+ 10.6; AST 19, ALT 17, AlkP 84 CBC: W 6.3, H/H 14.2/42.3, Plt 2.24 TC 178, TG 88, HDL 63.9, LDL 93   ASSESSMENT /  PLAN: Problem List Items Addressed  This Visit    Sustained ventricular tachycardia ([place]) (Chronic)    Status post ICD.  No further episodes.  Followed by Dr. [name]. Remain on amiodarone.  Need to follow annual eye exam TFTs and LFTs. Probably reasonable to recheck PFTs next year.      Relevant Orders   EKG 12-Lead (Completed)   S/P tissue AVR 02/11/13 (Chronic)    Due for follow-up echo in 2021.  Stable murmur.      Postoperative atrial fibrillation (HCC)    As far as I can tell.  No further episodes.  Is on amiodarone.  Not on anticoagulation since there has been no recurrence.  Nothing showed up on ICD evaluation.      Essential hypertension (Chronic)    Blood pressures pretty stable he is on amlodipine and lisinopril along with Pletal.      Relevant Orders   EKG 12-Lead (Completed)   Dyslipidemia, goal LDL below 70 (Chronic)    On combination Zetia plus high-dose statin and LDL of 93.  Not much more we can do.  He is not interested in taking more medications.  Do not know how much more we can get from switching him from 80 of Lipitor to Crestor.      Relevant Orders   EKG 12-Lead (Completed)   CAD S/P PTCA of CFX X 2 in '93 (Chronic)    Distant history of angioplasty.  No stents at the time.  Most recent cath in 2017 showed only moderate disease.  Not having any anginal symptoms. Is on aspirin, Plavix, statin and Pletal. He is also on amlodipine for vasodilation. Not on a beta-blocker since he is currently on amiodarone.      Relevant Orders   EKG 12-Lead (Completed)   Atherosclerotic peripheral vascular disease with intermittent claudication (HCC) - Primary (Chronic)    Pretty much limiting claudication now on the right leg with a severe SFA lesion noted.  He has totally occluded SFA. Is on Pletal, aspirin Plavix and amlodipine, but still now noticing limited claudication.  I will refer back to Dr. Quay Burow to determine pros and cons of relook peripheral angiography and possible intervention on the  right side.  He would like to avoid having it finally cleared like the left is.  (They were unable to cross the left lesion)      Relevant Orders   EKG 12-Lead (Completed)      Current medicines are reviewed at length with the patient today. (+/- concerns) n/a The following changes have been made: n/a  Patient Instructions  Medication Instructions:   NO CHANGES    If you need a refill on your cardiac medications before your next appointment, please call your pharmacy.   Lab work: NOT NEEDED   Testing/Procedures: NOT NEEDED  Follow-Up: At [facility], you and your health needs are our priority.  As part of our continuing mission to provide you with exceptional heart care, we have created designated Provider Care Teams.  These Care Teams include your primary Cardiologist (physician) and Advanced Practice Providers (APPs -  Physician Assistants and Nurse Practitioners) who all work together to provide you with the care you need, when you need it. You will need a follow up appointment in  12 months-SEPT 2021 - DR Malyia Moro .  Please call our office 2 months in advance to schedule this appointment.    Your physician recommends that you schedule a follow-up appointment DR BERRY - DISCUSS CLAUDICATION  AND LAST DOPPLER IN June 2020  Your physician recommends that you schedule a follow-up appointment in [place]   Any Other Special Instructions Will Be Listed Below (If Applicable).   Studies Ordered:   Orders Placed This Encounter  Procedures   EKG 12-Lead      Glenetta Hew, M.D., M.S. Interventional Cardiologist   Pager # [phone] Phone # [phone] [address] [place], [place] [id number]

## 2019-02-12 ENCOUNTER — Encounter: Payer: Self-pay | Admitting: Cardiology

## 2019-02-12 NOTE — Assessment & Plan Note (Signed)
As far as I can tell.  No further episodes.  Is on amiodarone.  Not on anticoagulation since there has been no recurrence.  Nothing showed up on ICD evaluation.

## 2019-02-12 NOTE — Assessment & Plan Note (Signed)
Blood pressures pretty stable he is on amlodipine and lisinopril along with Pletal.

## 2019-02-12 NOTE — Assessment & Plan Note (Signed)
Pretty much limiting claudication now on the right leg with a severe SFA lesion noted.  He has totally occluded SFA. Is on Pletal, aspirin Plavix and amlodipine, but still now noticing limited claudication.  I will refer back to Dr. Quay Burow to determine pros and cons of relook peripheral angiography and possible intervention on the right side.  He would like to avoid having it finally cleared like the left is.  (They were unable to cross the left lesion)

## 2019-02-12 NOTE — Assessment & Plan Note (Signed)
On combination Zetia plus high-dose statin and LDL of 93.  Not much more we can do.  He is not interested in taking more medications.  Do not know how much more we can get from switching him from 80 of Lipitor to Crestor.

## 2019-02-12 NOTE — Assessment & Plan Note (Signed)
Status post ICD.  No further episodes.  Followed by Dr. [name]. Remain on amiodarone.  Need to follow annual eye exam TFTs and LFTs. Probably reasonable to recheck PFTs next year.

## 2019-02-12 NOTE — Assessment & Plan Note (Signed)
Distant history of angioplasty.  No stents at the time.  Most recent cath in 2017 showed only moderate disease.  Not having any anginal symptoms. Is on aspirin, Plavix, statin and Pletal. He is also on amlodipine for vasodilation. Not on a beta-blocker since he is currently on amiodarone.

## 2019-02-12 NOTE — Assessment & Plan Note (Signed)
Due for follow-up echo in 2021.  Stable murmur.

## 2019-03-17 ENCOUNTER — Other Ambulatory Visit: Payer: Self-pay

## 2019-03-17 ENCOUNTER — Ambulatory Visit (INDEPENDENT_AMBULATORY_CARE_PROVIDER_SITE_OTHER): Payer: Medicare Other | Admitting: Cardiovascular Disease

## 2019-03-17 ENCOUNTER — Encounter: Payer: Self-pay | Admitting: Cardiovascular Disease

## 2019-03-17 DIAGNOSIS — I70219 Atherosclerosis of native arteries of extremities with intermittent claudication, unspecified extremity: Secondary | ICD-10-CM | POA: Diagnosis not present

## 2019-03-17 NOTE — Progress Notes (Signed)
03/17/2019 Ralph Drake   [birth year]  [id number]  Primary Physician Ralph Pink, MD Primary Cardiologist: Ralph Harp MD Ralph Drake, [place], [place]  HPI:  Ralph Drake is a 84 y.o.  former patient of Dr. Terance Drake, currently cared for by Dr. Glenetta Drake, referred for peripheral vascular evaluation because of lifestyle limiting claudication.  I last saw him in the office 10/07/2013.  He history is remarkable for remote PTCA of his circumflex by myself back in 1993. He had severe aortic stenosis and had a tissue AVR 02/11/13. He's had a left common iliac artery stent in 1996 along with a right SFA PTA. His other problems include hypertension and hyperlipidemia. Since his operation he has tried to walk but has gained weight. He has left greater than right lower decortication. Doppler suggests an occluded left SFA with high-grade right common iliac artery stenosis. I performed lower extremity angiography on him 09/10/13 revealing an occluded left left SFA which was calcified and moderately wall with three-vessel runoff and a 50% ostial right common iliac artery stenosis with a 30 mm gradient and I did not think was significant enough to warrant intervention. He is partially limited by his left leg.  I referred him to Dr. Trula Drake for consideration of left femoropopliteal bypass grafting however he recommended exercise therapy and advised against surgical revascularization.  Since I saw him 5 years ago he continues to complain of claudication right worse than left.  He is referred back by Dr. Ellyn Drake for consideration of intervention.  His renal function is moderately impaired.  He has a 2+ right femoral pulse with bruit.  He denies chest pain or shortness of breath.   Current Meds  Medication Sig  . amiodarone (PACERONE) 200 MG tablet TAKE 1 TABLET BY MOUTH  DAILY  . amLODipine (NORVASC) 10 MG tablet TAKE 1 TABLET BY MOUTH  DAILY  . aspirin 81 MG EC tablet Take 1 tablet (81 mg total)  by mouth daily.  [name] atorvastatin (LIPITOR) 80 MG tablet TAKE 1 TABLET BY MOUTH  DAILY  . cilostazol (PLETAL) 100 MG tablet TAKE 1 TABLET BY MOUTH TWO  TIMES DAILY  . clopidogrel (PLAVIX) 75 MG tablet Take 1 tablet (75 mg total) by mouth daily.  [name] ezetimibe (ZETIA) 10 MG tablet Take 1 tablet (10 mg total) by mouth daily.  . fish oil-omega-3 fatty acids 1000 MG capsule Take 1 g by mouth daily.   [name] levothyroxine (SYNTHROID, LEVOTHROID) 50 MCG tablet Take 50 mcg by mouth daily before breakfast.  . lisinopril (PRINIVIL,ZESTRIL) 10 MG tablet TAKE 1 TABLET BY MOUTH  DAILY.  . Multiple Vitamins-Minerals (MULTIVITAMIN WITH MINERALS) tablet Take 1 tablet by mouth daily.  . vitamin E 400 UNIT capsule Take 2 capsules by mouth daily.   Current Facility-Administered Medications for the 03/17/19 encounter (Office Visit) with Ralph Harp, MD  Medication  . 0.9 %  sodium chloride infusion     No Known Allergies  Social History   Socioeconomic History  . Marital status: Married    Spouse name: Not on file  . Number of children: Not on file  . Years of education: Not on file  . Highest education level: Not on file  Occupational History  . Not on file  Social Needs  . Financial resource strain: Not on file  . Food insecurity    Worry: Not on file    Inability: Not on file  . Transportation needs    Medical: Not  on file    Non-medical: Not on file  Tobacco Use  . Smoking status: Former Smoker    Packs/day: 0.50    Years: 60.00    Pack years: 30.00    Types: Cigarettes    Quit date: 01/31/2013    Years since quitting: 6.1  . Smokeless tobacco: Never Used  Substance and Sexual Activity  . Alcohol use: No    Alcohol/week: 0.0 standard drinks  . Drug use: No  . Sexual activity: Not on file  Lifestyle  . Physical activity    Days per week: Not on file    Minutes per session: Not on file  . Stress: Not on file  Relationships  . Social Herbalist on phone: Not on file     Gets together: Not on file    Attends religious service: Not on file    Active member of club or organization: Not on file    Attends meetings of clubs or organizations: Not on file    Relationship status: Not on file  . Intimate partner violence    Fear of current or ex partner: Not on file    Emotionally abused: Not on file    Physically abused: Not on file    Forced sexual activity: Not on file  Other Topics Concern  . Not on file  Social History Narrative   Married with no children. He long-term smoker who quit prior to his aVR in 2014.    Up until his operation he worked part-time at [employer].  He is not yet gone back to work postoperatively.   He chose not be cardiac rehabilitation, but has gone back to walking exercising daily.     Review of Systems: General: negative for chills, fever, night sweats or weight changes.  Cardiovascular: negative for chest pain, dyspnea on exertion, edema, orthopnea, palpitations, paroxysmal nocturnal dyspnea or shortness of breath Dermatological: negative for rash Respiratory: negative for cough or wheezing Urologic: negative for hematuria Abdominal: negative for nausea, vomiting, diarrhea, bright red blood per rectum, melena, or hematemesis Neurologic: negative for visual changes, syncope, or dizziness All other systems reviewed and are otherwise negative except as noted above.    Blood pressure 140/80, pulse 82, height 5\' 5"  (1.651 m), weight 150 lb 6.4 oz (68.2 kg), SpO2 90 %.  General appearance: alert and no distress Neck: no adenopathy, no carotid bruit, no JVD, supple, symmetrical, trachea midline and thyroid not enlarged, symmetric, no tenderness/mass/nodules Lungs: clear to auscultation bilaterally Heart: regular rate and rhythm, S1, S2 normal, no murmur, click, rub or gallop Extremities: extremities normal, atraumatic, no cyanosis or edema Pulses: Diminished pedal pulses, 2+ right femoral pulse with bruit Skin: Skin  color, texture, turgor normal. No rashes or lesions Neurologic: Alert and oriented X 3, normal strength and tone. Normal symmetric reflexes. Normal coordination and gait  EKG not performed today  ASSESSMENT AND PLAN:   Atherosclerotic peripheral vascular disease with intermittent claudication [facility]) Ralph Drake has a history of symptomatic PAD.  He has had remote left common iliac artery stenting in 1996 along with right SFA PTA.  I angiogrammed him 09/10/2013 revealing long segment CTO left SFA which was calcified with three-vessel runoff.  I sent him to Dr. Trula Drake who recommended exercise therapy.  I also demonstrated a 50% ostial right common iliac artery stenosis with a 20 mm gradient and 40% calcified mid right SFA with three-vessel runoff.  He is fairly active for his age and does  complain of symptomatic right lower extremity lifestyle limiting claudication.  Recent Doppler studies performed 11/28/2018 revealed a high-frequency signal in his right common iliac artery.  His right ABI was 0.7 and he did have a moderately elevated signal in the mid right SFA.  He has moderate renal insufficiency as well.  We will recheck his lab work and plan on performing right common iliac artery intervention.      Ralph Harp MD FACP,FACC,FAHA, [facility] 03/17/2019 12:22 PM

## 2019-03-17 NOTE — H&P (View-Only) (Signed)
03/17/2019 Ralph Drake   May 06, 1935  [id number]  Primary Physician [name], MD Primary Cardiologist: [name] MD [name], [place], [place]  HPI:  Ralph Drake is a 84 y.o.  former patient of Dr. Terance Ice, currently cared for by Dr. Glenetta Hew, referred for peripheral vascular evaluation because of lifestyle limiting claudication.  I last saw him in the office 10/07/2013.  He history is remarkable for remote PTCA of his circumflex by myself back in 1993. He had severe aortic stenosis and had a tissue AVR 02/11/13. He's had a left common iliac artery stent in 1996 along with a right SFA PTA. His other problems include hypertension and hyperlipidemia. Since his operation he has tried to walk but has gained weight. He has left greater than right lower decortication. Doppler suggests an occluded left SFA with high-grade right common iliac artery stenosis. I performed lower extremity angiography on him 09/10/13 revealing an occluded left left SFA which was calcified and moderately wall with three-vessel runoff and a 50% ostial right common iliac artery stenosis with a 30 mm gradient and I did not think was significant enough to warrant intervention. He is partially limited by his left leg.  I referred him to Dr. Trula Slade for consideration of left femoropopliteal bypass grafting however he recommended exercise therapy and advised against surgical revascularization.  Since I saw him 5 years ago he continues to complain of claudication right worse than left.  He is referred back by Dr. [name] for consideration of intervention.  His renal function is moderately impaired.  He has a 2+ right femoral pulse with bruit.  He denies chest pain or shortness of breath.   Current Meds  Medication Sig  . amiodarone (PACERONE) 200 MG tablet TAKE 1 TABLET BY MOUTH  DAILY  . amLODipine (NORVASC) 10 MG tablet TAKE 1 TABLET BY MOUTH  DAILY  . aspirin 81 MG EC tablet Take 1 tablet (81 mg total)  by mouth daily.  [name] atorvastatin (LIPITOR) 80 MG tablet TAKE 1 TABLET BY MOUTH  DAILY  . cilostazol (PLETAL) 100 MG tablet TAKE 1 TABLET BY MOUTH TWO  TIMES DAILY  . clopidogrel (PLAVIX) 75 MG tablet Take 1 tablet (75 mg total) by mouth daily.  [name] ezetimibe (ZETIA) 10 MG tablet Take 1 tablet (10 mg total) by mouth daily.  . fish oil-omega-3 fatty acids 1000 MG capsule Take 1 g by mouth daily.   [name] levothyroxine (SYNTHROID, LEVOTHROID) 50 MCG tablet Take 50 mcg by mouth daily before breakfast.  . lisinopril (PRINIVIL,ZESTRIL) 10 MG tablet TAKE 1 TABLET BY MOUTH  DAILY.  . Multiple Vitamins-Minerals (MULTIVITAMIN WITH MINERALS) tablet Take 1 tablet by mouth daily.  . vitamin E 400 UNIT capsule Take 2 capsules by mouth daily.   Current Facility-Administered Medications for the 03/17/19 encounter (Office Visit) with [name], MD  Medication  . 0.9 %  sodium chloride infusion     No Known Allergies  Social History   Socioeconomic History  . Marital status: Married    Spouse name: Not on file  . Number of children: Not on file  . Years of education: Not on file  . Highest education level: Not on file  Occupational History  . Not on file  Social Needs  . Financial resource strain: Not on file  . Food insecurity    Worry: Not on file    Inability: Not on file  . Transportation needs    Medical: Not  on file    Non-medical: Not on file  Tobacco Use  . Smoking status: Former Smoker    Packs/day: 0.50    Years: 60.00    Pack years: 30.00    Types: Cigarettes    Quit date: 01/31/2013    Years since quitting: 6.1  . Smokeless tobacco: Never Used  Substance and Sexual Activity  . Alcohol use: No    Alcohol/week: 0.0 standard drinks  . Drug use: No  . Sexual activity: Not on file  Lifestyle  . Physical activity    Days per week: Not on file    Minutes per session: Not on file  . Stress: Not on file  Relationships  . Social Herbalist on phone: Not on file     Gets together: Not on file    Attends religious service: Not on file    Active member of club or organization: Not on file    Attends meetings of clubs or organizations: Not on file    Relationship status: Not on file  . Intimate partner violence    Fear of current or ex partner: Not on file    Emotionally abused: Not on file    Physically abused: Not on file    Forced sexual activity: Not on file  Other Topics Concern  . Not on file  Social History Narrative   Married with no children. He long-term smoker who quit prior to his aVR in 2014.    Up until his operation he worked part-time at [employer].  He is not yet gone back to work postoperatively.   He chose not be cardiac rehabilitation, but has gone back to walking exercising daily.     Review of Systems: General: negative for chills, fever, night sweats or weight changes.  Cardiovascular: negative for chest pain, dyspnea on exertion, edema, orthopnea, palpitations, paroxysmal nocturnal dyspnea or shortness of breath Dermatological: negative for rash Respiratory: negative for cough or wheezing Urologic: negative for hematuria Abdominal: negative for nausea, vomiting, diarrhea, bright red blood per rectum, melena, or hematemesis Neurologic: negative for visual changes, syncope, or dizziness All other systems reviewed and are otherwise negative except as noted above.    Blood pressure 140/80, pulse 82, height 5\' 5"  (1.651 m), weight 150 lb 6.4 oz (68.2 kg), SpO2 90 %.  General appearance: alert and no distress Neck: no adenopathy, no carotid bruit, no JVD, supple, symmetrical, trachea midline and thyroid not enlarged, symmetric, no tenderness/mass/nodules Lungs: clear to auscultation bilaterally Heart: regular rate and rhythm, S1, S2 normal, no murmur, click, rub or gallop Extremities: extremities normal, atraumatic, no cyanosis or edema Pulses: Diminished pedal pulses, 2+ right femoral pulse with bruit Skin: Skin  color, texture, turgor normal. No rashes or lesions Neurologic: Alert and oriented X 3, normal strength and tone. Normal symmetric reflexes. Normal coordination and gait  EKG not performed today  ASSESSMENT AND PLAN:   Atherosclerotic peripheral vascular disease with intermittent claudication [facility]) Mr. Salehi has a history of symptomatic PAD.  He has had remote left common iliac artery stenting in 1996 along with right SFA PTA.  I angiogrammed him 09/10/2013 revealing long segment CTO left SFA which was calcified with three-vessel runoff.  I sent him to Dr. Trula Slade who recommended exercise therapy.  I also demonstrated a 50% ostial right common iliac artery stenosis with a 20 mm gradient and 40% calcified mid right SFA with three-vessel runoff.  He is fairly active for his age and does  complain of symptomatic right lower extremity lifestyle limiting claudication.  Recent Doppler studies performed 11/28/2018 revealed a high-frequency signal in his right common iliac artery.  His right ABI was 0.7 and he did have a moderately elevated signal in the mid right SFA.  He has moderate renal insufficiency as well.  We will recheck his lab work and plan on performing right common iliac artery intervention.      [name] MD FACP,FACC,FAHA, [facility] 03/17/2019 12:22 PM

## 2019-03-17 NOTE — Patient Instructions (Addendum)
[address] [place] [address] Dept: [phone] Loc: [place]  03/17/2019  You are scheduled for a Peripheral Angiogram on Monday, October 12 with Dr. Quay Burow.  1. Please arrive at the [facility] (Main Entrance A) at [facility]: [address] [place], [place] [address] at 7:30 AM (This time is two hours before your procedure to ensure your preparation). Free valet parking service is available.   Special note: Every effort is made to have your procedure done on time. Please understand that emergencies sometimes delay scheduled procedures.  2. Diet: Do not eat solid foods after midnight.  The patient may have clear liquids until 5am upon the day of the procedure.  3. Labs:  You will need to have blood drawn on 03/19/2019. No appointment necessary. Go to:  [facility] at [facility] [id number], [place], [place] [address] BASIC METABOLIC PANEL, COMPLETE BLOOD COUNT, AND THYROID STIMULATING HORMONE LAB WORK. NO APPOINTMENT IS NEEDED. YOU MUST HAVE THIS LAB WORK DONE BEFORE GOING TO GET YOUR COVID-19 TEST DONE BECAUSE YOU WILL NEED TO SELF-ISOLATE AFTER THE COVID-19 TEST UNTIL THE DAY OF YOUR [place].  You will need a COVID-19 test on 03/19/2019 at 11:00am. Go to: [facility] Entrance [place], [place] [address] FOR YOUR COVID-19 TEST. YOU MUST HAVE YOUR COVID-19 TEST COMPLETED 4 DAYS PRIOR TO YOUR UPCOMING PROCEDURE/TEST. YOU WILL ALSO NEED TO SELF-ISOLATE AFTER THE COVID-19 TEST UNTIL THE DAY OF YOUR PROCEDURE/TEST. PLEASE BRING YOUR I.D. AND YOUR INSURANCE CARD(S) WITH YOU.   4. Medication instructions in preparation for your procedure:   On the morning of your procedure, take your Plavix/Clopidogrel and any morning medicines NOT listed above.  You may use sips of water.  5. Plan for  one night stay--bring personal belongings. 6. Bring a current list of your medications and current insurance cards. 7. You MUST have a responsible person to drive you home. 8. Someone MUST be with you the first 24 hours after you arrive home or your discharge will be delayed. 9. Please wear clothes that are easy to get on and off and wear slip-on shoes.  Thank you for allowing [place] to care for you!   -- [facility]  ______________________________________________________________________________________________________  Testing/Procedures: Your physician has requested that you have an ankle brachial index (ABI). During this test an ultrasound and blood pressure cuff are used to evaluate the arteries that supply the arms and legs with blood. Allow thirty minutes for this exam. There are no restrictions or special instructions. DUE AFTER YOUR PERIPHERAL ANGIOGRAM. TO BE SCHEDULED  Your physician has requested that you have an aorta/iliac duplex. During this test, an ultrasound is used to evaluate blood flow to the aorta and iliac arteries. Allow one hour for this exam. Do not eat after midnight the day before and avoid carbonated beverages. DUE AFTER YOUR PERIPHERAL ANGIOGRAM.  TO BE SCHEDULED  Follow-Up: At [facility], you and your health needs are our priority.  As part of our continuing mission to provide you with exceptional heart care, we have created designated Provider Care Teams.  These Care Teams include your primary Cardiologist (physician) and Advanced Practice Providers (APPs -  Physician Assistants and Nurse Practitioners) who all work together to provide you with the care you need, when you need it. You will need a follow up appointment with  Dr. Quay Burow after your ultrasounds.TO BE SCHEDULED.  Please call our office 2 months in advance to schedule this/each appointment.

## 2019-03-17 NOTE — Assessment & Plan Note (Signed)
Mr. Arch has a history of symptomatic PAD.  He has had remote left common iliac artery stenting in 1996 along with right SFA PTA.  I angiogrammed him 09/10/2013 revealing long segment CTO left SFA which was calcified with three-vessel runoff.  I sent him to Dr. Trula Slade who recommended exercise therapy.  I also demonstrated a 50% ostial right common iliac artery stenosis with a 20 mm gradient and 40% calcified mid right SFA with three-vessel runoff.  He is fairly active for his age and does complain of symptomatic right lower extremity lifestyle limiting claudication.  Recent Doppler studies performed 11/28/2018 revealed a high-frequency signal in his right common iliac artery.  His right ABI was 0.7 and he did have a moderately elevated signal in the mid right SFA.  He has moderate renal insufficiency as well.  We will recheck his lab work and plan on performing right common iliac artery intervention.

## 2019-03-19 ENCOUNTER — Other Ambulatory Visit (HOSPITAL_COMMUNITY)
Admission: RE | Admit: 2019-03-19 | Discharge: 2019-03-19 | Disposition: A | Discharge status: Home / Self Care | Payer: Medicare Other | Source: Ambulatory Visit | Attending: Cardiovascular Disease | Admitting: Cardiovascular Disease

## 2019-03-19 ENCOUNTER — Telehealth: Payer: Self-pay | Admitting: *Deleted

## 2019-03-19 DIAGNOSIS — Z01812 Encounter for preprocedural laboratory examination: Secondary | ICD-10-CM | POA: Insufficient documentation

## 2019-03-19 DIAGNOSIS — Z20828 Contact with and (suspected) exposure to other viral communicable diseases: Secondary | ICD-10-CM | POA: Diagnosis not present

## 2019-03-19 LAB — CBC
Hematocrit: 39.9 % (ref 37.5–51.0)
Hemoglobin: 13.4 g/dL (ref 13.0–17.7)
MCH: 30.5 pg (ref 26.6–33.0)
MCHC: 33.6 g/dL (ref 31.5–35.7)
MCV: 91 fL (ref 79–97)
Platelets: 220 10*3/uL (ref 150–450)
RBC: 4.39 x10E6/uL (ref 4.14–5.80)
RDW: 14 % (ref 11.6–15.4)
WBC: 5.8 10*3/uL (ref 3.4–10.8)

## 2019-03-19 LAB — BASIC METABOLIC PANEL
BUN/Creatinine Ratio: 16 (ref 10–24)
BUN: 26 mg/dL (ref 8–27)
CO2: 20 mmol/L (ref 20–29)
Calcium: 10 mg/dL (ref 8.6–10.2)
Chloride: 98 mmol/L (ref 96–106)
Creatinine, Ser: 1.65 mg/dL — ABNORMAL HIGH (ref 0.76–1.27)
GFR calc Af Amer: 44 mL/min/{1.73_m2} — ABNORMAL LOW (ref >59)
GFR calc non Af Amer: 38 mL/min/{1.73_m2} — ABNORMAL LOW (ref >59)
Glucose: 77 mg/dL (ref 65–99)
Potassium: 5 mmol/L (ref 3.5–5.2)
Sodium: 133 mmol/L — ABNORMAL LOW (ref 134–144)

## 2019-03-19 NOTE — Telephone Encounter (Addendum)
Pt contacted pre-abdominal aortogram  scheduled at [facility] for: Monday  March 23, 2019 9:30 AM Verified arrival time and place: [facility]) at: 7:30 AM   No solid food after midnight prior to cath, clear liquids until 5 AM day of procedure. Contrast allergy: no   AM meds can be  taken pre-cath with sip of water including: ASA 81 mg Plavix 75 mg  Confirmed patient has responsible adult to drive home post procedure and observe 24 hours after arriving home-yes  Currently, due to Covid-19 pandemic, only one support person will be allowed with patient. Must be the same support person for that patient's entire stay, will be screened and required to wear a mask. They will be asked to wait in the waiting room for the duration of the patient's stay.  Patients are required to wear a mask when they enter the hospital.      COVID-19 Pre-Screening Questions:  . In the past 7 to 10 days have you had a cough,  shortness of breath, headache, congestion, fever (100 or greater) body aches, chills, sore throat, or sudden loss of taste or sense of smell? no . Have you been around anyone with known Covid 19? no . Have you been around anyone who is awaiting Covid 19 test results in the past 7 to 10 days? . Have you been around anyone who has been exposed to Covid 19, or has mentioned symptoms of Covid 19 within the past 7 to 10 days? no   I reviewed procedure/mask/visitor instructions, Covid-19 screening questions with patient, he verbalized understanding, thanked me for call.

## 2019-03-20 LAB — NOVEL CORONAVIRUS, NAA (HOSP ORDER, SEND-OUT TO REF LAB; TAT 18-24 HRS): SARS-CoV-2, NAA: NOT DETECTED

## 2019-03-22 ENCOUNTER — Other Ambulatory Visit: Payer: Self-pay

## 2019-03-23 ENCOUNTER — Other Ambulatory Visit: Payer: Self-pay

## 2019-03-23 ENCOUNTER — Encounter (HOSPITAL_COMMUNITY)
Admission: RE | Disposition: A | Discharge status: Home / Self Care | Payer: Self-pay | Source: Home / Self Care | Attending: Cardiovascular Disease

## 2019-03-23 ENCOUNTER — Ambulatory Visit (HOSPITAL_COMMUNITY)
Admission: RE | Admit: 2019-03-23 | Discharge: 2019-03-24 | Disposition: A | Discharge status: Home / Self Care | Payer: Medicare Other | Attending: Cardiovascular Disease | Admitting: Cardiovascular Disease

## 2019-03-23 DIAGNOSIS — I771 Stricture of artery: Secondary | ICD-10-CM | POA: Diagnosis present

## 2019-03-23 DIAGNOSIS — I2510 Atherosclerotic heart disease of native coronary artery without angina pectoris: Secondary | ICD-10-CM | POA: Insufficient documentation

## 2019-03-23 DIAGNOSIS — I70211 Atherosclerosis of native arteries of extremities with intermittent claudication, right leg: Secondary | ICD-10-CM | POA: Insufficient documentation

## 2019-03-23 DIAGNOSIS — Z952 Presence of prosthetic heart valve: Secondary | ICD-10-CM | POA: Insufficient documentation

## 2019-03-23 DIAGNOSIS — N1832 Chronic kidney disease, stage 3b: Secondary | ICD-10-CM | POA: Diagnosis not present

## 2019-03-23 DIAGNOSIS — Z7982 Long term (current) use of aspirin: Secondary | ICD-10-CM | POA: Insufficient documentation

## 2019-03-23 DIAGNOSIS — E785 Hyperlipidemia, unspecified: Secondary | ICD-10-CM | POA: Insufficient documentation

## 2019-03-23 DIAGNOSIS — Z95828 Presence of other vascular implants and grafts: Secondary | ICD-10-CM | POA: Diagnosis not present

## 2019-03-23 DIAGNOSIS — Z87891 Personal history of nicotine dependence: Secondary | ICD-10-CM | POA: Diagnosis not present

## 2019-03-23 DIAGNOSIS — I739 Peripheral vascular disease, unspecified: Secondary | ICD-10-CM | POA: Diagnosis present

## 2019-03-23 DIAGNOSIS — I350 Nonrheumatic aortic (valve) stenosis: Secondary | ICD-10-CM | POA: Diagnosis not present

## 2019-03-23 DIAGNOSIS — I129 Hypertensive chronic kidney disease with stage 1 through stage 4 chronic kidney disease, or unspecified chronic kidney disease: Secondary | ICD-10-CM | POA: Diagnosis not present

## 2019-03-23 DIAGNOSIS — Z79899 Other long term (current) drug therapy: Secondary | ICD-10-CM | POA: Insufficient documentation

## 2019-03-23 HISTORY — PX: LOWER EXTREMITY ANGIOGRAPHY: CATH118251

## 2019-03-23 HISTORY — PX: ABDOMINAL AORTOGRAM: CATH118222

## 2019-03-23 HISTORY — PX: PERIPHERAL VASCULAR INTERVENTION: CATH118257

## 2019-03-23 LAB — POCT ACTIVATED CLOTTING TIME
Activated Clotting Time: 252 seconds
Activated Clotting Time: 241 seconds
Activated Clotting Time: 158 s

## 2019-03-23 SURGERY — LOWER EXTREMITY ANGIOGRAPHY
Anesthesia: LOCAL | Laterality: Right

## 2019-03-23 MED ORDER — ACETAMINOPHEN 325 MG PO TABS: 650.0000 mg | ORAL_TABLET | ORAL | Status: DC | PRN

## 2019-03-23 MED ORDER — SODIUM CHLORIDE 0.9 % IV SOLN: 250.0000 mL | INTRAVENOUS | Status: DC | PRN

## 2019-03-23 MED ORDER — SODIUM CHLORIDE 0.9% FLUSH
3.0000 mL | Freq: Two times a day (BID) | INTRAVENOUS | Status: DC
  Administered 2019-03-24: 09:00:00 3 mL via INTRAVENOUS

## 2019-03-23 MED ORDER — NITROGLYCERIN 1 MG/10 ML FOR IR/CATH LAB
INTRA_ARTERIAL | Status: AC
  Filled 2019-03-23: qty 10

## 2019-03-23 MED ORDER — LIDOCAINE HCL (PF) 1 % IJ SOLN
INTRAMUSCULAR | Status: DC | PRN
  Administered 2019-03-23: 25 mL

## 2019-03-23 MED ORDER — SODIUM CHLORIDE 0.9% FLUSH
3.0000 mL | Freq: Two times a day (BID) | INTRAVENOUS | Status: DC
  Administered 2019-03-23 – 2019-03-24 (×3): 3 mL via INTRAVENOUS

## 2019-03-23 MED ORDER — CILOSTAZOL 50 MG PO TABS
100.0000 mg | ORAL_TABLET | Freq: Two times a day (BID) | ORAL | Status: DC
  Administered 2019-03-23 – 2019-03-24 (×2): 100 mg via ORAL
  Filled 2019-03-23 (×3): qty 1

## 2019-03-23 MED ORDER — CLOPIDOGREL BISULFATE 75 MG PO TABS: 75.0000 mg | ORAL_TABLET | ORAL | Status: DC

## 2019-03-23 MED ORDER — ONDANSETRON HCL 4 MG/2ML IJ SOLN: 4.0000 mg | Freq: Four times a day (QID) | INTRAMUSCULAR | Status: DC | PRN

## 2019-03-23 MED ORDER — SODIUM CHLORIDE 0.9% FLUSH: 3.0000 mL | INTRAVENOUS | Status: DC | PRN

## 2019-03-23 MED ORDER — ATORVASTATIN CALCIUM 80 MG PO TABS: 80.0000 mg | ORAL_TABLET | Freq: Every day | ORAL | Status: DC

## 2019-03-23 MED ORDER — CLOPIDOGREL BISULFATE 75 MG PO TABS
75.0000 mg | ORAL_TABLET | Freq: Every day | ORAL | Status: DC
  Administered 2019-03-24: 75 mg via ORAL
  Filled 2019-03-23: qty 1

## 2019-03-23 MED ORDER — IODIXANOL 320 MG/ML IV SOLN
INTRAVENOUS | Status: DC | PRN
  Administered 2019-03-23: 105 mL

## 2019-03-23 MED ORDER — LISINOPRIL 10 MG PO TABS
10.0000 mg | ORAL_TABLET | Freq: Every day | ORAL | Status: DC
  Administered 2019-03-23 – 2019-03-24 (×2): 10 mg via ORAL
  Filled 2019-03-23 (×2): qty 1

## 2019-03-23 MED ORDER — HYDRALAZINE HCL 20 MG/ML IJ SOLN: 5.0000 mg | INTRAMUSCULAR | Status: DC | PRN

## 2019-03-23 MED ORDER — LABETALOL HCL 5 MG/ML IV SOLN: 10.0000 mg | INTRAVENOUS | Status: DC | PRN

## 2019-03-23 MED ORDER — HEPARIN SODIUM (PORCINE) 1000 UNIT/ML IJ SOLN
INTRAMUSCULAR | Status: DC | PRN
  Administered 2019-03-23: 7000 [IU] via INTRAVENOUS

## 2019-03-23 MED ORDER — ASPIRIN 81 MG PO CHEW: 81.0000 mg | CHEWABLE_TABLET | ORAL | Status: DC

## 2019-03-23 MED ORDER — MORPHINE SULFATE (PF) 2 MG/ML IV SOLN: 2.0000 mg | INTRAVENOUS | Status: DC | PRN

## 2019-03-23 MED ORDER — SODIUM CHLORIDE 0.9 % IV SOLN
INTRAVENOUS | Status: AC
  Administered 2019-03-23: 19:00:00 via INTRAVENOUS

## 2019-03-23 MED ORDER — HEPARIN (PORCINE) IN NACL 1000-0.9 UT/500ML-% IV SOLN
INTRAVENOUS | Status: DC | PRN
  Administered 2019-03-23 (×2): 500 mL

## 2019-03-23 MED ORDER — HEPARIN (PORCINE) IN NACL 1000-0.9 UT/500ML-% IV SOLN
INTRAVENOUS | Status: AC
  Filled 2019-03-23: qty 1000

## 2019-03-23 MED ORDER — ASPIRIN 81 MG PO TBEC: 81.0000 mg | DELAYED_RELEASE_TABLET | Freq: Every day | ORAL | Status: DC

## 2019-03-23 MED ORDER — LIDOCAINE HCL (PF) 1 % IJ SOLN
INTRAMUSCULAR | Status: AC
  Filled 2019-03-23: qty 30

## 2019-03-23 MED ORDER — ASPIRIN EC 81 MG PO TBEC
81.0000 mg | DELAYED_RELEASE_TABLET | Freq: Every day | ORAL | Status: DC
  Administered 2019-03-24: 09:00:00 81 mg via ORAL
  Filled 2019-03-23: qty 1

## 2019-03-23 MED ORDER — SODIUM CHLORIDE 0.9 % WEIGHT BASED INFUSION: 1.0000 mL/kg/h | INTRAVENOUS | Status: DC

## 2019-03-23 MED ORDER — EZETIMIBE 10 MG PO TABS
10.0000 mg | ORAL_TABLET | Freq: Every day | ORAL | Status: DC
  Administered 2019-03-24: 10 mg via ORAL
  Filled 2019-03-23: qty 1

## 2019-03-23 MED ORDER — SODIUM CHLORIDE 0.9 % WEIGHT BASED INFUSION
3.0000 mL/kg/h | INTRAVENOUS | Status: DC
  Administered 2019-03-23: 09:00:00 3 mL/kg/h via INTRAVENOUS

## 2019-03-23 MED ORDER — AMIODARONE HCL 200 MG PO TABS
200.0000 mg | ORAL_TABLET | Freq: Every day | ORAL | Status: DC
  Administered 2019-03-24: 200 mg via ORAL
  Filled 2019-03-23: qty 1

## 2019-03-23 MED ORDER — NITROGLYCERIN 1 MG/10 ML FOR IR/CATH LAB
INTRA_ARTERIAL | Status: DC | PRN
  Administered 2019-03-23: 200 ug via INTRA_ARTERIAL

## 2019-03-23 MED ORDER — AMLODIPINE BESYLATE 10 MG PO TABS
10.0000 mg | ORAL_TABLET | Freq: Every day | ORAL | Status: DC
  Administered 2019-03-24: 10 mg via ORAL
  Filled 2019-03-23: qty 1

## 2019-03-23 MED ORDER — LEVOTHYROXINE SODIUM 50 MCG PO TABS
50.0000 ug | ORAL_TABLET | Freq: Every day | ORAL | Status: DC
  Administered 2019-03-24: 06:00:00 50 ug via ORAL
  Filled 2019-03-23: qty 1

## 2019-03-23 SURGICAL SUPPLY — 19 items
BALLN MUSTANG 8X20X75 (BALLOONS) ×4
BALLOON MUSTANG 8X20X75 (BALLOONS) ×1 IMPLANT
CATH ANGIO 5F PIGTAIL 65CM (CATHETERS) ×2 IMPLANT
CATH STRAIGHT 5FR 65CM (CATHETERS) ×2 IMPLANT
GUIDEWIRE ANGLED .035X150CM (WIRE) ×3 IMPLANT
KIT ENCORE 26 ADVANTAGE (KITS) ×2 IMPLANT
KIT PV (KITS) ×4 IMPLANT
SHEATH BRITE TIP 7FR 35CM (SHEATH) ×3 IMPLANT
SHEATH PINNACLE 5F 10CM (SHEATH) ×3 IMPLANT
SHEATH PINNACLE 7F 10CM (SHEATH) ×3 IMPLANT
STENT VIABAHN 7X29X80 VBX (Permanent Stent) ×3 IMPLANT
STOPCOCK MORSE 400PSI 3WAY (MISCELLANEOUS) ×2 IMPLANT
SYR MEDRAD MARK 7 150ML (SYRINGE) ×4 IMPLANT
TAPE VIPERTRACK RADIOPAQ (MISCELLANEOUS) ×1 IMPLANT
TAPE VIPERTRACK RADIOPAQUE (MISCELLANEOUS) ×4
TRANSDUCER W/STOPCOCK (MISCELLANEOUS) ×4 IMPLANT
TRAY PV CATH (CUSTOM PROCEDURE TRAY) ×4 IMPLANT
TUBING CIL FLEX 10 FLL-RA (TUBING) ×2 IMPLANT
WIRE HITORQ VERSACORE ST 145CM (WIRE) ×3 IMPLANT

## 2019-03-23 NOTE — Interval H&P Note (Signed)
History and Physical Interval Note:  03/23/2019 9:28 AM  Carloyn Manner  has presented today for surgery, with the diagnosis of Peripheral Vascular Disease.  The various methods of treatment have been discussed with the patient and family. After consideration of risks, benefits and other options for treatment, the patient has consented to  Procedure(s): ABDOMINAL AORTOGRAM W/LOWER EXTREMITY (Bilateral) as a surgical intervention.  The patient's history has been reviewed, patient examined, no change in status, stable for surgery.  I have reviewed the patient's chart and labs.  Questions were answered to the patient's satisfaction.     Quay Burow

## 2019-03-23 NOTE — Progress Notes (Signed)
Pt transferred to 4E-12 via stretcher from cath lab. Pt transferred to bed. Pt given CHG bath. Tele applied, CCMD notified. VSS. R groin level 0. Pt oriented to call bell, bed and room. Call bell within reach. Pt provided menu to order meal. Wife at pt bedside. Will continue to monitor. Amanda Cockayne, RN

## 2019-03-24 ENCOUNTER — Ambulatory Visit (INDEPENDENT_AMBULATORY_CARE_PROVIDER_SITE_OTHER): Payer: Medicare Other | Admitting: *Deleted

## 2019-03-24 ENCOUNTER — Encounter (HOSPITAL_COMMUNITY): Payer: Self-pay | Admitting: Cardiovascular Disease

## 2019-03-24 DIAGNOSIS — I739 Peripheral vascular disease, unspecified: Secondary | ICD-10-CM | POA: Diagnosis not present

## 2019-03-24 DIAGNOSIS — I472 Ventricular tachycardia, unspecified: Secondary | ICD-10-CM

## 2019-03-24 DIAGNOSIS — I771 Stricture of artery: Secondary | ICD-10-CM | POA: Diagnosis not present

## 2019-03-24 DIAGNOSIS — I4510 Unspecified right bundle-branch block: Secondary | ICD-10-CM

## 2019-03-24 DIAGNOSIS — I70211 Atherosclerosis of native arteries of extremities with intermittent claudication, right leg: Secondary | ICD-10-CM | POA: Diagnosis not present

## 2019-03-24 LAB — CBC
HCT: 37.2 % — ABNORMAL LOW (ref 39.0–52.0)
Hemoglobin: 12.6 g/dL — ABNORMAL LOW (ref 13.0–17.0)
MCH: 31.6 pg (ref 26.0–34.0)
MCHC: 33.9 g/dL (ref 30.0–36.0)
MCV: 93.2 fL (ref 80.0–100.0)
Platelets: 201 10*3/uL (ref 150–400)
RBC: 3.99 MIL/uL — ABNORMAL LOW (ref 4.22–5.81)
RDW: 14.8 % (ref 11.5–15.5)
WBC: 7.2 10*3/uL (ref 4.0–10.5)
nRBC: 0 % (ref 0.0–0.2)

## 2019-03-24 LAB — BASIC METABOLIC PANEL
Anion gap: 9 (ref 5–15)
BUN: 19 mg/dL (ref 8–23)
CO2: 22 mmol/L (ref 22–32)
Calcium: 9.9 mg/dL (ref 8.9–10.3)
Chloride: 107 mmol/L (ref 98–111)
Creatinine, Ser: 1.66 mg/dL — ABNORMAL HIGH (ref 0.61–1.24)
GFR calc Af Amer: 44 mL/min — ABNORMAL LOW (ref >60)
GFR calc non Af Amer: 38 mL/min — ABNORMAL LOW (ref >60)
Glucose, Bld: 111 mg/dL — ABNORMAL HIGH (ref 70–99)
Potassium: 4.8 mmol/L (ref 3.5–5.1)
Sodium: 138 mmol/L (ref 135–145)

## 2019-03-24 NOTE — Discharge Instructions (Signed)

## 2019-03-24 NOTE — Discharge Summary (Signed)
Discharge Summary    Patient ID: Ralph Drake MRN: [id number]; DOB: [birth year]  Admit date: 03/23/2019 Discharge date: 03/24/2019  Primary Care Provider: [name], MD  Primary Cardiologist: Glenetta Hew, MD  Primary Electrophysiologist:  None  Peripheral Vascular MD: Dr Gwenlyn Found  Discharge Diagnoses    Active Problems:   Stenosis of iliac artery [facility])   Claudication in peripheral vascular disease ([name])   Allergies No Known Allergies  Diagnostic Studies/Procedures    PV CATH: 03/23/2019   Procedures Performed: 1. Abdominal aortogram/bilateral iliac angiogram/right lower extremity runoff 2. Pullback gradient across the right common iliac artery after administration of intra-arterial nitroglycerin 3. PTA and covered stenting using a VBX stent of the right common iliac artery ostium Angiographic Data:   1: Abdominal aorta-the abdominal aorta was moderately atherosclerotic 2: Right lower extremity-there was a 70% ostial/proximal right common iliac artery stenosis with a 40 mm pullback gradient. There was a 50% segmental proximal right extra iliac artery stenosis without a pullback gradient. There was a 70 to 80% calcified right common femoral artery stenosis, 80% calcified proximal right SFA stenosis, 80% calcified mid right SFA, 90% calcified popliteal artery stenosis with three-vessel runoff  IMPRESSION:Ralph Drake has high-grade physiologically significant ostial right common iliac artery stenosis with multiple calcified segmental disease in the common femoral, proximal and mid SFA as well as popliteal arteries. He does have lifestyle limiting claudication. We will proceed with VBX covered stenting of the origin of his right common iliac artery and then assess clinical improvement. If he has continued claudication we may perform staged SFA intervention.  Procedure Description: The 5 French sheath was exchanged over an  035 versa core wire for a 7 French bright tip sheath. The patient received 7000 as of heparin intravenously with an ACT of 252. Total contrast administered the patient was 105 cc. I carefully positioned a 7 mm x 29 mm long VBX stent at the origin of the right common iliac artery and deployed at 12 atm. I postdilated with an 8 mm x 2 cm noncompliant balloon resulting reduction of a 70% calcified ostial right common iliac artery stenosis to 0% residual. I believe the stent was precisely placed allowing access to the right SFA from a contralateral approach. The bright tip sheath was then exchanged over wire for a short 7 [place] sheath which was secured in place  Final Impression:Successful right common iliac artery ostial/proximal VBX covered stenting for a physiologically significant lesion in the setting of lifestyle limiting claudication with residual calcified disease in the right common femoral, proximal and mid SFA and popliteal arteries. The patient is already on dual antiplatelet therapy. The sheath will be removed once ACT falls below 170 and pressure held. Patient will be hydrated overnight given his moderate renal insufficiency discharged home in the morning. We will obtain lower extremity arterial Doppler studies in our [facility] line office next week and I will see him back 1 to 2 weeks thereafter _____________   History of Present Illness     Ralph Drake is a 84 y.o. male with  hx PTCA CFX 1993, tissue AVR 2014, L-CIA stent w/ R-SFT PTA 1996, HTN, HLD, VT s/p STJ pacing ICD, CKD III, was admitted for PV cath 10/12, done for life-style limiting claudication sx.   Hospital Course     Consultants: None   Ralph Drake tolerated the procedure well. He rested comfortably overnight.   On 10/13, he was seen by Dr Claiborne Billings and all data were reviewed. He  was ambulating without chest pain or SOB. His cath site was without ecchymosis or hematoma.   His renal function was stable post-cath.    No further inpatient workup was indicated and he is considered stable for discharge, to follow up as an outpt.   Did the patient have an acute coronary syndrome (MI, NSTEMI, STEMI, etc) this admission?:  No                               Did the patient have a percutaneous coronary intervention (stent / angioplasty)?:  No.   _____________  Discharge Vitals Blood pressure (!) 147/76, pulse 68, temperature 98 F (36.7 C), resp. rate 15, height '5\' 5"'  (1.651 m), weight 68 kg, SpO2 93 %.  Filed Weights   03/23/19 0730  Weight: 68 kg    Labs & Radiologic Studies    CBC Recent Labs    03/24/19 0256  WBC 7.2  HGB 12.6*  HCT 37.2*  MCV 93.2  PLT 962   Basic Metabolic Panel Recent Labs    03/24/19 0256  NA 138  K 4.8  CL 107  CO2 22  GLUCOSE 111*  BUN 19  CREATININE 1.66*  CALCIUM 9.9   _____________  No results found. Disposition   Pt is being discharged home today in good condition.  Follow-up Plans & Appointments    Follow-up Information    [name], MD Follow up.   Specialties: Cardiology, Radiology Why: Keep appointments for ultrasound on 10/28 and see Dr Gwenlyn Found 11/03 Contact information: [address] [place] [address] [phone]          Discharge Instructions    Diet - low sodium heart healthy   Complete by: As directed    Increase activity slowly   Complete by: As directed       Discharge Medications   Allergies as of 03/24/2019   No Known Allergies     Medication List    TAKE these medications   acetaminophen 500 MG tablet Commonly known as: TYLENOL Take 500 mg by mouth every 6 (six) hours as needed for moderate pain or headache.   amiodarone 200 MG tablet Commonly known as: PACERONE TAKE 1 TABLET BY MOUTH  DAILY   amLODipine 10 MG tablet Commonly known as: NORVASC TAKE 1 TABLET BY MOUTH  DAILY   aspirin 81 MG EC tablet Take 1 tablet (81 mg total) by mouth daily.   atorvastatin 80 MG tablet  Commonly known as: LIPITOR TAKE 1 TABLET BY MOUTH  DAILY   cilostazol 100 MG tablet Commonly known as: PLETAL TAKE 1 TABLET BY MOUTH TWO  TIMES DAILY   clopidogrel 75 MG tablet Commonly known as: PLAVIX Take 1 tablet (75 mg total) by mouth daily.   ezetimibe 10 MG tablet Commonly known as: ZETIA Take 1 tablet (10 mg total) by mouth daily.   fish oil-omega-3 fatty acids 1000 MG capsule Take 1 g by mouth daily.   levothyroxine 50 MCG tablet Commonly known as: SYNTHROID Take 50 mcg by mouth daily before breakfast.   lisinopril 10 MG tablet Commonly known as: ZESTRIL TAKE 1 TABLET BY MOUTH  DAILY. What changed: when to take this   loratadine 10 MG tablet Commonly known as: CLARITIN Take 10 mg by mouth daily as needed for allergies.   multivitamin with minerals tablet Take 1 tablet by mouth daily.   vitamin E 400 UNIT capsule Take 2  capsules by mouth daily.          Outstanding Labs/Studies   None  Duration of Discharge Encounter   Greater than 30 minutes including physician time.  Signed, Rosaria Ferries, PA-C 03/24/2019, 11:26 AM

## 2019-03-24 NOTE — Progress Notes (Addendum)
Progress Note  Patient Name: Ralph Drake Date of Encounter: 03/24/2019  Primary Cardiologist: Glenetta Hew, MD  Subjective   No chest pain or SOB overnight  Inpatient Medications    Scheduled Meds: . amiodarone  200 mg Oral Daily  . amLODipine  10 mg Oral Daily  . aspirin EC  81 mg Oral Daily  . atorvastatin  80 mg Oral q1800  . cilostazol  100 mg Oral BID  . clopidogrel  75 mg Oral Daily  . ezetimibe  10 mg Oral Daily  . levothyroxine  50 mcg Oral QAC breakfast  . lisinopril  10 mg Oral Daily  . sodium chloride flush  3 mL Intravenous Q12H  . sodium chloride flush  3 mL Intravenous Q12H   Continuous Infusions: . sodium chloride     PRN Meds: sodium chloride, acetaminophen, hydrALAZINE, labetalol, morphine injection, ondansetron (ZOFRAN) IV, sodium chloride flush   Vital Signs    Vitals:   03/24/19 0700 03/24/19 0800 03/24/19 0900 03/24/19 0909  BP:    138/80  Pulse: 68     Resp: 18 (!) 23 (!) 22   Temp:      TempSrc:      SpO2: 93%     Weight:      Height:        Intake/Output Summary (Last 24 hours) at 03/24/2019 1042 Last data filed at 03/24/2019 0409 Gross per 24 hour  Intake 688.76 ml  Output 1900 ml  Net -1211.24 ml   Filed Weights   03/23/19 0730  Weight: 68 kg   Last Weight  Most recent update: 03/23/2019  7:33 AM   Weight  68 kg (150 lb)           Weight change:    Telemetry    AV or A pacing - Personally Reviewed  ECG    None today - Personally Reviewed  Physical Exam   General: Well developed, well nourished, male appearing in no acute distress. Head: Normocephalic, atraumatic.  Neck: Supple without bruits, JVD not elevated. Lungs:  Resp regular and unlabored, rales bases Heart: RRR, S1, S2, no S3, S4, or murmur; no rub. Abdomen: Soft, non-tender, non-distended with normoactive bowel sounds. No hepatomegaly. No rebound/guarding. No obvious abdominal masses. Extremities: No clubbing, cyanosis, no edema. Distal pedal  pulses are 2+ bilaterally. R femoral cath site w/out ecchymosis or hematoma, +bruit Neuro: Alert and oriented X 3. Moves all extremities spontaneously. Psych: Normal affect.  Labs    Hematology Recent Labs  Lab 03/19/19 0955 03/24/19 0256  WBC 5.8 7.2  RBC 4.39 3.99*  HGB 13.4 12.6*  HCT 39.9 37.2*  MCV 91 93.2  MCH 30.5 31.6  MCHC 33.6 33.9  RDW 14.0 14.8  PLT 220 201    Chemistry Recent Labs  Lab 03/19/19 0955 03/24/19 0256  NA 133* 138  K 5.0 4.8  CL 98 107  CO2 20 22  GLUCOSE 77 111*  BUN 26 19  CREATININE 1.65* 1.66*  CALCIUM 10.0 9.9  GFRNONAA 38* 38*  GFRAA 44* 44*  ANIONGAP  --  9     Radiology    No results found.   Cardiac Studies   PV CATH: 03/23/2019   Procedures Performed:               1.  Abdominal aortogram/bilateral iliac angiogram/right lower extremity runoff               2.  Pullback gradient across the right common iliac  artery after administration of intra-arterial nitroglycerin               3.  PTA and covered stenting using a VBX stent of the right common iliac artery ostium Angiographic Data:   1: Abdominal aorta-the abdominal aorta was moderately atherosclerotic 2: Right lower extremity-there was a 70% ostial/proximal right common iliac artery stenosis with a 40 mm pullback gradient.  There was a 50% segmental proximal right extra iliac artery stenosis without a pullback gradient.  There was a 70 to 80% calcified right common femoral artery stenosis, 80% calcified proximal right SFA stenosis, 80% calcified mid right SFA, 90% calcified popliteal artery stenosis with three-vessel runoff  IMPRESSION: Ralph Drake has high-grade physiologically significant ostial right common iliac artery stenosis with multiple calcified segmental disease in the common femoral, proximal and mid SFA as well as popliteal arteries.  He does have lifestyle limiting claudication.  We will proceed with VBX covered stenting of the origin of his right common  iliac artery and then assess clinical improvement.  If he has continued claudication we may perform staged SFA intervention.  Procedure Description: The 5 French sheath was exchanged over an 035 versa core wire for a 7 French bright tip sheath.  The patient received 7000 as of heparin intravenously with an ACT of 252.  Total contrast administered the patient was 105 cc.  I carefully positioned a 7 mm x 29 mm long VBX stent at the origin of the right common iliac artery and deployed at 12 atm.  I postdilated with an 8 mm x 2 cm noncompliant balloon resulting reduction of a 70% calcified ostial right common iliac artery stenosis to 0% residual.  I believe the stent was precisely placed allowing access to the right SFA from a contralateral approach.  The bright tip sheath was then exchanged over wire for a short 7 [place] sheath which was secured in place  Final Impression: Successful right common iliac artery ostial/proximal VBX covered stenting for a physiologically significant lesion in the setting of lifestyle limiting claudication with residual calcified disease in the right common femoral, proximal and mid SFA and popliteal arteries.  The patient is already on dual antiplatelet therapy.  The sheath will be removed once ACT falls below 170 and pressure held.  Patient will be hydrated overnight given his moderate renal insufficiency discharged home in the morning.  We will obtain lower extremity arterial Doppler studies in our [facility] line office next week and I will see him back 1 to 2 weeks thereafter  Patient Profile     84 y.o. male w/ hx PTCA CFX 1993, tissue AVR 2014, L-CIA stent w/ R-SFT PTA 1996, HTN, HLD, VT s/p STJ pacing ICD, was admitted for PV cath 10/12, done for life-style limiting claudication sx.   Assessment & Plan    1. PAD:  - Procedure results above - s/p R-CIA VBX covered stent - continue DAPT - Outpt appts already made  2. CKD III - renal function stable post-procedure  3.  CAD - No sx  4. HX VT - STJ pacer working well.  Active Problems:   Stenosis of iliac artery ([facility])   Claudication in peripheral vascular disease ([place])  Plan: d/c and keep appts.   Jonetta Speak , PA-C 10:42 AM 03/24/2019 Pager: [phone]    Patient seen and examined. Agree with assessment and plan. Feels well post procedure. Groin site stable. S/P VBX stent to R common iliac artery.  OK for DC today.  Troy Sine, MD, [facility] 03/24/2019 11:13 AM

## 2019-03-25 LAB — CUP PACEART REMOTE DEVICE CHECK
Battery Remaining Longevity: 64 mo
Battery Remaining Percentage: 70 %
Battery Voltage: 2.96 V
Brady Statistic AP VP Percent: 7.2 %
Brady Statistic AP VS Percent: 84 %
Brady Statistic AS VP Percent: 1 %
Brady Statistic AS VS Percent: 9 %
Brady Statistic RA Percent Paced: 90 %
Brady Statistic RV Percent Paced: 7.3 %
Date Time Interrogation Session: 20201013170102
HighPow Impedance: 65 Ohm
HighPow Impedance: 65 Ohm
Implantable Lead Implant Date: 20170601
Implantable Lead Implant Date: 20170601
Implantable Lead Location: 753859
Implantable Lead Location: 753860
Implantable Lead Model: 7122
Implantable Pulse Generator Implant Date: 20171221
Lead Channel Impedance Value: 390 Ohm
Lead Channel Impedance Value: 450 Ohm
Lead Channel Pacing Threshold Amplitude: 0.75 V
Lead Channel Pacing Threshold Amplitude: 1 V
Lead Channel Pacing Threshold Pulse Width: 0.5 ms
Lead Channel Pacing Threshold Pulse Width: 0.5 ms
Lead Channel Sensing Intrinsic Amplitude: 3.2 mV
Lead Channel Sensing Intrinsic Amplitude: 3.2 mV
Lead Channel Setting Pacing Amplitude: 1 V
Lead Channel Setting Pacing Amplitude: 2 V
Lead Channel Setting Pacing Pulse Width: 0.5 ms
Lead Channel Setting Sensing Sensitivity: 0.5 mV
Pulse Gen Serial Number: 7390376

## 2019-04-03 NOTE — Progress Notes (Signed)
Remote ICD transmission.   

## 2019-04-08 ENCOUNTER — Other Ambulatory Visit: Payer: Self-pay | Admitting: Cardiovascular Disease

## 2019-04-08 ENCOUNTER — Ambulatory Visit (HOSPITAL_COMMUNITY)
Admission: RE | Admit: 2019-04-08 | Discharge: 2019-04-08 | Disposition: A | Discharge status: Home / Self Care | Payer: Medicare Other | Source: Ambulatory Visit | Attending: Cardiology | Admitting: Cardiology

## 2019-04-08 ENCOUNTER — Ambulatory Visit (HOSPITAL_BASED_OUTPATIENT_CLINIC_OR_DEPARTMENT_OTHER)
Admission: RE | Admit: 2019-04-08 | Discharge: 2019-04-08 | Disposition: A | Discharge status: Home / Self Care | Payer: Medicare Other | Source: Ambulatory Visit | Attending: Cardiovascular Disease | Admitting: Cardiovascular Disease

## 2019-04-08 ENCOUNTER — Other Ambulatory Visit: Payer: Self-pay

## 2019-04-08 DIAGNOSIS — I70219 Atherosclerosis of native arteries of extremities with intermittent claudication, unspecified extremity: Secondary | ICD-10-CM

## 2019-04-08 DIAGNOSIS — Z95828 Presence of other vascular implants and grafts: Secondary | ICD-10-CM

## 2019-04-14 ENCOUNTER — Other Ambulatory Visit: Payer: Self-pay

## 2019-04-14 ENCOUNTER — Encounter: Payer: Self-pay | Admitting: Cardiovascular Disease

## 2019-04-14 ENCOUNTER — Ambulatory Visit (INDEPENDENT_AMBULATORY_CARE_PROVIDER_SITE_OTHER): Payer: Medicare Other | Admitting: Cardiovascular Disease

## 2019-04-14 VITALS — BP 141/73 | HR 84 | Temp 97.2°F | Ht 65.0 in | Wt 153.0 lb

## 2019-04-14 DIAGNOSIS — I739 Peripheral vascular disease, unspecified: Secondary | ICD-10-CM

## 2019-04-14 DIAGNOSIS — I70219 Atherosclerosis of native arteries of extremities with intermittent claudication, unspecified extremity: Secondary | ICD-10-CM | POA: Diagnosis not present

## 2019-04-14 NOTE — Assessment & Plan Note (Signed)
Ralph Drake returns today after having undergone peripheral intervention by myself 03/23/2019.  He was complaining of right greater than left lower extremity claudication.  He has moderate renal insufficiency and a known occluded left SFA.  I did refer him to Dr. Trula Slade for consideration of left femoropopliteal bypass graft however he recommended exercise therapy and advised against a surgical approach which I thought was reasonable.  I performed angiography and runoff of his right lower extremity revealed a hemodynamically significant calcified right common iliac artery stenosis.  I performed VBX covered stenting using a 7 mm x 29 mm long VBX covered stent with an excellent angiographic result.  I placed it precisely at the ostium allowing contralateral access.  He did have a high-grade calcified right common femoral artery stenosis, proximal mid and distal right SFA stenosis as well as right popliteal stenosis.  He cannot tell a difference in his claudication.  I am referring him back to Dr. Trula Slade for consideration of right common femoral endarterectomy with patch angioplasty which would then allow me to percutaneously address his infrainguinal disease.

## 2019-04-14 NOTE — Patient Instructions (Signed)
Medication Instructions:  Your physician recommends that you continue on your current medications as directed. Please refer to the Current Medication list given to you today.  If you need a refill on your cardiac medications before your next appointment, please call your pharmacy.   Lab work: NONE  Testing/Procedures: NONE  Follow-Up: At [facility], you and your health needs are our priority.  As part of our continuing mission to provide you with exceptional heart care, we have created designated Provider Care Teams.  These Care Teams include your primary Cardiologist (physician) and Advanced Practice Providers (APPs -  Physician Assistants and Nurse Practitioners) who all work together to provide you with the care you need, when you need it. You may see Dr Gwenlyn Found or one of the following Advanced Practice Providers on your designated Care Team:    Kerin Ransom, PA-C  [place], [place]  Coletta Memos, [place] Your physician wants you to follow-up in: 3 months. You will receive a reminder letter in the mail two months in advance. If you don't receive a letter, please call our office to schedule the follow-up appointment.   Any Other Special Instructions Will Be Listed Below (If Applicable). You have been referred to Vascular Surgery.

## 2019-04-14 NOTE — Progress Notes (Signed)
04/14/2019 Ralph Drake   March 19, 1935  [id number]  Primary Physician [name], MD Primary Cardiologist: [name] MD [name], [place], [place]  HPI:  Ralph Drake is a 84 y.o.  former patient of Dr. Terance Ice, currently cared for by Dr. Glenetta Hew, referred for peripheral vascular evaluation because of lifestyle limiting claudication.  I last saw him in the office 03/17/2019.  He history is remarkable for remote PTCA of his circumflex by myself back in 1993. He had severe aortic stenosis and had a tissue AVR 02/11/13. He's had a left common iliac artery stent in 1996 along with a right SFA PTA. His other problems include hypertension and hyperlipidemia. Since his operation he has tried to walk but has gained weight. He has left greater than right lower decortication. Doppler suggests an occluded left SFA with high-grade right common iliac artery stenosis. I performed lower extremity angiography on him 09/10/13 revealing an occluded left left SFA which was calcified and moderately wall with three-vessel runoff and a 50% ostial right common iliac artery stenosis with a 30 mm gradient and I did not think was significant enough to warrant intervention. He is partially limited by his left leg.  I referred him to Dr. Trula Slade for consideration of left femoropopliteal bypass grafting however he recommended exercise therapy and advised against surgical revascularization.   He has continues to complain of claudication right worse than left.  He is referred back by Dr. [name] for consideration of intervention.  His renal function is moderately impaired.  He has a 2+ right femoral pulse with bruit.  He denies chest pain or shortness of breath.  I performed peripheral angiography on him 03/23/2019 revealing a physiologically significant ostial right common iliac artery stenosis with a 40 mm pullback, high-grade calcified right common femoral artery stenosis as well as proximal mid and  distal right SFA and popliteal disease with three-vessel runoff.  I performed VBX covered stenting of his right common iliac artery ostium with an excellent result precisely hitting the origin and allowing contralateral access.  He really cannot tell a difference in his claudication is Dopplers are unchanged.  I am referring him back to Dr. Trula Slade for consideration of right common femoral endarterectomy and patch angioplasty.    Current Meds  Medication Sig  . acetaminophen (TYLENOL) 500 MG tablet Take 500 mg by mouth every 6 (six) hours as needed for moderate pain or headache.  [name] amiodarone (PACERONE) 200 MG tablet TAKE 1 TABLET BY MOUTH  DAILY (Patient taking differently: Take 200 mg by mouth daily. )  . amLODipine (NORVASC) 10 MG tablet TAKE 1 TABLET BY MOUTH  DAILY (Patient taking differently: Take 10 mg by mouth daily. )  . aspirin 81 MG EC tablet Take 1 tablet (81 mg total) by mouth daily.  [name] atorvastatin (LIPITOR) 80 MG tablet TAKE 1 TABLET BY MOUTH  DAILY (Patient taking differently: Take 80 mg by mouth daily. )  . cilostazol (PLETAL) 100 MG tablet TAKE 1 TABLET BY MOUTH TWO  TIMES DAILY (Patient taking differently: Take 100 mg by mouth 2 (two) times daily. )  . clopidogrel (PLAVIX) 75 MG tablet Take 1 tablet (75 mg total) by mouth daily.  [name] ezetimibe (ZETIA) 10 MG tablet Take 1 tablet (10 mg total) by mouth daily.  . fish oil-omega-3 fatty acids 1000 MG capsule Take 1 g by mouth daily.   [name] levothyroxine (SYNTHROID, LEVOTHROID) 50 MCG tablet Take 50 mcg by mouth daily before  breakfast.  . lisinopril (PRINIVIL,ZESTRIL) 10 MG tablet TAKE 1 TABLET BY MOUTH  DAILY. (Patient taking differently: Take 10 mg by mouth at bedtime. )  . loratadine (CLARITIN) 10 MG tablet Take 10 mg by mouth daily as needed for allergies.  . Multiple Vitamins-Minerals (MULTIVITAMIN WITH MINERALS) tablet Take 1 tablet by mouth daily.  . vitamin E 400 UNIT capsule Take 2 capsules by mouth daily.     No Known  Allergies  Social History   Socioeconomic History  . Marital status: Married    Spouse name: Not on file  . Number of children: Not on file  . Years of education: Not on file  . Highest education level: Not on file  Occupational History  . Not on file  Social Needs  . Financial resource strain: Not on file  . Food insecurity    Worry: Not on file    Inability: Not on file  . Transportation needs    Medical: Not on file    Non-medical: Not on file  Tobacco Use  . Smoking status: Former Smoker    Packs/day: 0.50    Years: 60.00    Pack years: 30.00    Types: Cigarettes    Quit date: 01/31/2013    Years since quitting: 6.2  . Smokeless tobacco: Never Used  Substance and Sexual Activity  . Alcohol use: No    Alcohol/week: 0.0 standard drinks  . Drug use: No  . Sexual activity: Not on file  Lifestyle  . Physical activity    Days per week: Not on file    Minutes per session: Not on file  . Stress: Not on file  Relationships  . Social Herbalist on phone: Not on file    Gets together: Not on file    Attends religious service: Not on file    Active member of club or organization: Not on file    Attends meetings of clubs or organizations: Not on file    Relationship status: Not on file  . Intimate partner violence    Fear of current or ex partner: Not on file    Emotionally abused: Not on file    Physically abused: Not on file    Forced sexual activity: Not on file  Other Topics Concern  . Not on file  Social History Narrative   Married with no children. He long-term smoker who quit prior to his aVR in 2014.    Up until his operation he worked part-time at [employer].  He is not yet gone back to work postoperatively.   He chose not be cardiac rehabilitation, but has gone back to walking exercising daily.     Review of Systems: General: negative for chills, fever, night sweats or weight changes.  Cardiovascular: negative for chest pain, dyspnea  on exertion, edema, orthopnea, palpitations, paroxysmal nocturnal dyspnea or shortness of breath Dermatological: negative for rash Respiratory: negative for cough or wheezing Urologic: negative for hematuria Abdominal: negative for nausea, vomiting, diarrhea, bright red blood per rectum, melena, or hematemesis Neurologic: negative for visual changes, syncope, or dizziness All other systems reviewed and are otherwise negative except as noted above.    Blood pressure (!) 141/73, pulse 84, temperature (!) 97.2 F (36.2 C), height 5\' 5"  (1.651 m), weight 153 lb (69.4 kg), SpO2 90 %.  General appearance: alert and no distress Neck: no adenopathy, no carotid bruit, no JVD, supple, symmetrical, trachea midline and thyroid not enlarged, symmetric, no tenderness/mass/nodules  Lungs: clear to auscultation bilaterally Heart: regular rate and rhythm, S1, S2 normal, no murmur, click, rub or gallop Extremities: extremities normal, atraumatic, no cyanosis or edema Pulses: Diminished pedal pulses Skin: Skin color, texture, turgor normal. No rashes or lesions Neurologic: Alert and oriented X 3, normal strength and tone. Normal symmetric reflexes. Normal coordination and gait  EKG not performed today  ASSESSMENT AND PLAN:   Claudication in peripheral vascular disease [place]) Mr. Maltese returns today after having undergone peripheral intervention by myself 03/23/2019.  He was complaining of right greater than left lower extremity claudication.  He has moderate renal insufficiency and a known occluded left SFA.  I did refer him to Dr. Trula Slade for consideration of left femoropopliteal bypass graft however he recommended exercise therapy and advised against a surgical approach which I thought was reasonable.  I performed angiography and runoff of his right lower extremity revealed a hemodynamically significant calcified right common iliac artery stenosis.  I performed VBX covered stenting using a 7 mm x 29 mm long  VBX covered stent with an excellent angiographic result.  I placed it precisely at the ostium allowing contralateral access.  He did have a high-grade calcified right common femoral artery stenosis, proximal mid and distal right SFA stenosis as well as right popliteal stenosis.  He cannot tell a difference in his claudication.  I am referring him back to Dr. Trula Slade for consideration of right common femoral endarterectomy with patch angioplasty which would then allow me to percutaneously address his infrainguinal disease.      [name] MD FACP,FACC,FAHA, Ralph Drake 04/14/2019 3:32 PM

## 2019-04-24 ENCOUNTER — Encounter (HOSPITAL_COMMUNITY): Payer: Medicare Other

## 2019-05-30 ENCOUNTER — Other Ambulatory Visit: Payer: Self-pay | Admitting: Cardiology

## 2019-05-30 DIAGNOSIS — E785 Hyperlipidemia, unspecified: Secondary | ICD-10-CM

## 2019-06-01 ENCOUNTER — Encounter: Payer: Medicare Other | Admitting: Surgery

## 2019-06-01 NOTE — Telephone Encounter (Signed)
Rx(s) sent to pharmacy electronically.  

## 2019-06-23 ENCOUNTER — Ambulatory Visit (INDEPENDENT_AMBULATORY_CARE_PROVIDER_SITE_OTHER): Payer: Medicare Other | Admitting: *Deleted

## 2019-06-23 DIAGNOSIS — I472 Ventricular tachycardia, unspecified: Secondary | ICD-10-CM

## 2019-06-23 LAB — CUP PACEART REMOTE DEVICE CHECK
Battery Remaining Longevity: 61 mo
Battery Remaining Percentage: 67 %
Battery Voltage: 2.95 V
Brady Statistic AP VP Percent: 7.6 %
Brady Statistic AP VS Percent: 85 %
Brady Statistic AS VP Percent: 1 %
Brady Statistic AS VS Percent: 7.8 %
Brady Statistic RA Percent Paced: 91 %
Brady Statistic RV Percent Paced: 7.6 %
Date Time Interrogation Session: 20210111020015
HighPow Impedance: 68 Ohm
HighPow Impedance: 68 Ohm
Implantable Lead Implant Date: 20170601
Implantable Lead Implant Date: 20170601
Implantable Lead Location: 753859
Implantable Lead Location: 753860
Implantable Lead Model: 7122
Implantable Pulse Generator Implant Date: 20171221
Lead Channel Impedance Value: 380 Ohm
Lead Channel Impedance Value: 450 Ohm
Lead Channel Pacing Threshold Amplitude: 0.5 V
Lead Channel Pacing Threshold Amplitude: 0.875 V
Lead Channel Pacing Threshold Pulse Width: 0.5 ms
Lead Channel Pacing Threshold Pulse Width: 0.5 ms
Lead Channel Sensing Intrinsic Amplitude: 3.2 mV
Lead Channel Sensing Intrinsic Amplitude: 3.5 mV
Lead Channel Setting Pacing Amplitude: 1.125
Lead Channel Setting Pacing Amplitude: 1.5 V
Lead Channel Setting Pacing Pulse Width: 0.5 ms
Lead Channel Setting Sensing Sensitivity: 0.5 mV
Pulse Gen Serial Number: 7390376

## 2019-07-15 ENCOUNTER — Ambulatory Visit: Payer: Medicare Other | Admitting: Cardiovascular Disease

## 2019-08-04 ENCOUNTER — Telehealth: Payer: Self-pay | Admitting: Internal Medicine

## 2019-08-04 NOTE — Telephone Encounter (Signed)
New Message   Pt is calling and is wondering if his wife can assist him to his appt because she is his interpreter and handles all of his health concerns   Please advise

## 2019-08-05 ENCOUNTER — Other Ambulatory Visit (HOSPITAL_COMMUNITY): Payer: Self-pay | Admitting: Cardiovascular Disease

## 2019-08-05 DIAGNOSIS — Z95820 Peripheral vascular angioplasty status with implants and grafts: Secondary | ICD-10-CM

## 2019-08-05 DIAGNOSIS — I739 Peripheral vascular disease, unspecified: Secondary | ICD-10-CM

## 2019-08-05 NOTE — Telephone Encounter (Signed)
Left detailed message for Pt.  Advised if he had a physical or mental deficit that he needed assistance with for his appt his wife may join him.  Advised if he did not, the office was diligently trying to reduce the number of people in the lobby to allow for greater access for more patients.  Advised his wife could be on speaker phone for his appt.  Advised to call back if any further questions.

## 2019-08-07 ENCOUNTER — Other Ambulatory Visit: Payer: Self-pay

## 2019-08-07 ENCOUNTER — Encounter: Payer: Self-pay | Admitting: Internal Medicine

## 2019-08-07 ENCOUNTER — Ambulatory Visit (INDEPENDENT_AMBULATORY_CARE_PROVIDER_SITE_OTHER): Payer: Medicare Other | Admitting: Internal Medicine

## 2019-08-07 VITALS — BP 126/74 | HR 63 | Ht 65.0 in | Wt 157.0 lb

## 2019-08-07 DIAGNOSIS — I472 Ventricular tachycardia, unspecified: Secondary | ICD-10-CM

## 2019-08-07 DIAGNOSIS — I5022 Chronic systolic (congestive) heart failure: Secondary | ICD-10-CM

## 2019-08-07 DIAGNOSIS — Z95810 Presence of automatic (implantable) cardiac defibrillator: Secondary | ICD-10-CM | POA: Insufficient documentation

## 2019-08-07 MED ORDER — AMIODARONE HCL 200 MG PO TABS: ORAL_TABLET | ORAL | 3 refills | Status: DC

## 2019-08-07 NOTE — Patient Instructions (Addendum)
Medication Instructions:  Your physician has recommended you make the following change in your medication:   1.  Reduce your amiodarone 200 mg-  Take one tablet by mouth daily Monday through Friday.  Do NOT take amiodarone on Saturday or Sunday.  Labwork: None ordered.  Testing/Procedures: None ordered.  Follow-Up: Your physician wants you to follow-up in: one year with Dr. [name].   You will receive a reminder letter in the mail two months in advance. If you don't receive a letter, please call our office to schedule the follow-up appointment.  Remote monitoring is used to monitor your ICD from home. This monitoring reduces the number of office visits required to check your device to one time per year. It allows [place] to keep an eye on the functioning of your device to ensure it is working properly. You are scheduled for a device check from home on 09/22/2019. You may send your transmission at any time that day. If you have a wireless device, the transmission will be sent automatically. After your physician reviews your transmission, you will receive a postcard with your next transmission date.  Any Other Special Instructions Will Be Listed Below (If Applicable).  If you need a refill on your cardiac medications before your next appointment, please call your pharmacy.

## 2019-08-07 NOTE — Progress Notes (Signed)
HPI Mr. Tucholski returns today for followup of his ICD and VT. He has an ICM and is s/p AVR. In the interim, he has done well with no chest pain or sob. He has class 2 dyspnea. He denies an ICD therapies. No edema. No Known Allergies   Current Outpatient Medications  Medication Sig Dispense Refill  . acetaminophen (TYLENOL) 500 MG tablet Take 500 mg by mouth every 6 (six) hours as needed for moderate pain or headache.    [name] amLODipine (NORVASC) 10 MG tablet Take 1 tablet (10 mg total) by mouth daily. 90 tablet 3  . aspirin 81 MG EC tablet Take 1 tablet (81 mg total) by mouth daily.    [name] atorvastatin (LIPITOR) 80 MG tablet Take 1 tablet (80 mg total) by mouth daily at 6 PM. 90 tablet 3  . cilostazol (PLETAL) 100 MG tablet Take 1 tablet (100 mg total) by mouth 2 (two) times daily. 180 tablet 3  . clopidogrel (PLAVIX) 75 MG tablet Take 1 tablet (75 mg total) by mouth daily. 90 tablet 3  . ezetimibe (ZETIA) 10 MG tablet Take 1 tablet (10 mg total) by mouth daily. 90 tablet 3  . fish oil-omega-3 fatty acids 1000 MG capsule Take 1 g by mouth daily.     [name] levothyroxine (SYNTHROID, LEVOTHROID) 50 MCG tablet Take 50 mcg by mouth daily before breakfast.    . lisinopril (PRINIVIL,ZESTRIL) 10 MG tablet TAKE 1 TABLET BY MOUTH  DAILY. (Patient taking differently: Take 10 mg by mouth at bedtime. ) 90 tablet 3  . loratadine (CLARITIN) 10 MG tablet Take 10 mg by mouth daily as needed for allergies.    . Multiple Vitamins-Minerals (MULTIVITAMIN WITH MINERALS) tablet Take 1 tablet by mouth daily.    . vitamin E 400 UNIT capsule Take 2 capsules by mouth daily.    [name] amiodarone (PACERONE) 200 MG tablet Take one tablet by mouth daily Monday through Friday.  Do NOT take on Saturday or Sunday. 90 tablet 3   No current facility-administered medications for this visit.     Past Medical History:  Diagnosis Date  . Bradycardia   . CAD S/P percutaneous coronary angioplasty 1993   POBA to Cx x 2 occasions:  10/2015:Mod (mod-severe CAD): p-mCx 50%. mLAD ~40%, mRCA ~40%. dRCA ~70%, No culprit lesion for VT.  [name] Dyslipidemia, goal LDL below 70   . Former heavy tobacco smoker     quit in October 2014  . GERD (gastroesophageal reflux disease)   . Hypertension   . Olecranon bursitis of left elbow   . Osteoarthritis   . Peripheral vascular disease ([place])     H/O R SFA stent, L Iliac Stent 1996;;  Lower Extremity Angiography April 2015: Occluded left SFA reconstituting at end of the canal, heavily calcified. Three-vessel runoff..;; RCA 50% ostial right common iliac, patent SFA stent -> Referred to Dr. Trula Slade of [facility].  . Postoperative atrial fibrillation ([facility])    No recurrence  . S/P AVR (aortic valve replacement) 02/10/2013   23 Edwards Magna Ease Pericardial Tissue Valve  . Severe aortic stenosis by prior echocardiogram    per Dr. Rollene Fare 2012; referred for aortic valve replacement  . Shortness of breath    Chronic  . Trigger ring finger of left hand   . Tubular adenoma of colon   . VT (ventricular tachycardia) ([place])    hemodynamically significant VT on 11/07/2015, nonobstructive CAD, no culprit lesion, underwent St Jude Dual Chamber ICD  by Dr. [name]    ROS:   All systems reviewed and negative except as noted in the HPI.   Past Surgical History:  Procedure Laterality Date  . ABDOMINAL ANGIOGRAM  09/10/2013   Procedure: ABDOMINAL ANGIOGRAM;  Surgeon: [name], MD;  Location: [facility] CATH LAB;  Service: Cardiovascular;;  . ABDOMINAL AORTOGRAM N/A 03/23/2019   Procedure: ABDOMINAL AORTOGRAM;  Surgeon: [name], MD;  Location: [place] CV LAB;  Service: Cardiovascular;  Laterality: N/A;  . AORTIC VALVE REPLACEMENT N/A 02/10/2013   AORTIC VALVE REPLACEMENT (AVR);  Surgeon: Grace Isaac, MD;  Location: [facility] OR;  Service: Open Heart Surgery:  Dr. Servando Snare: [address] Ease Pericardia  . ARTERIAL DOPPLER - PRE-CABG  02/06/2013   Bilateral 1-39% ICA stenosis  . CARDIAC  CATHETERIZATION  August 2014   Nonobstructive 30-50% RCA. No significant LAD disease. 30% mid circumflex.  [name] CARDIAC CATHETERIZATION  11/10/2009   No intervention - recommend medical management  . CARDIAC CATHETERIZATION N/A 11/09/2015   Procedure: Left Heart Cath and Coronary Angiography;  Surgeon: Belva Crome, MD;  Location: [facility] INVASIVE CV LAB: Mod (mod-severe CAD): p-mCx 50%. mLAD ~40%, mRCA ~40%. dRCA ~70%, No culprit lesion for VT.  [name] CARDIOVASCULAR STRESS TEST  06/24/2012   Diffuse LV hypokinesis with moderately depressed systolic function and EF 60%,  . CORONARY ANGIOPLASTY  1993   Circumflex x 2   . EP IMPLANTABLE DEVICE N/A 11/10/2015   Procedure: ICD Implant;  Surgeon: Evans Lance, MD;  Location: [place] CV LAB;  Service: Cardiovascular;  Laterality: N/A;  . ICD GENERATOR CHANGEOUT N/A 05/31/2016   Procedure: ICD Generator Changeout;  Surgeon: Thompson Grayer, MD;  Location: [place] CV LAB;  Service: Cardiovascular;  Laterality: N/A;  . ILIAC ARTERY STENT Left 1996  . INTRAOPERATIVE TRANSESOPHAGEAL ECHOCARDIOGRAM N/A 02/10/2013   Procedure: INTRAOPERATIVE TRANSESOPHAGEAL ECHOCARDIOGRAM;  Surgeon: Grace Isaac, MD;  Location: [place];  Service: Open Heart Surgery;  Laterality: N/A;  . LEFT AND RIGHT HEART CATHETERIZATION WITH CORONARY ANGIOGRAM N/A 01/28/2013   Procedure: LEFT AND RIGHT HEART CATHETERIZATION WITH CORONARY ANGIOGRAM;  Surgeon: Leonie Man, MD;  Location: [facility] CATH LAB;  Service: Cardiovascular;  Laterality: N/A;  . LOWER EXTREMITY ANGIOGRAM N/A 09/10/2013   Procedure: LOWER EXTREMITY ANGIOGRAM;  Surgeon: [name], MD;  Location: [facility] CATH LAB;  Service: Cardiovascular;  Laterality: N/A;  . LOWER EXTREMITY ANGIOGRAPHY Right 03/23/2019   Procedure: Lower Extremity Angiography;  Surgeon: [name], MD;  Location: [place] CV LAB;  Service: Cardiovascular;  Laterality: Right;  . OLECRANON BURSECTOMY  07/23/2011   Procedure: OLECRANON BURSA;  Surgeon:  Lorn Junes, MD;  Location: [place];  Service: Orthopedics;  Laterality: Left;  excision olecranon bursa left elbow patient had supraclaviular block in preop  . PERIPHERAL VASCULAR ANGIOGRAM  07/19/1994   No intervention-recommend medical therapy  . PERIPHERAL VASCULAR ANGIOGRAM  01/17/1995   Right SFA 80-90# lesion, dilatation performed with a Cordis "Optz 5" 73mm x 2cm, inflated a 4-7.5atm for 40 to 70 seconds. Completion angiogram done by hand with "bolus chase" runoff from Primal SFA to foot. [place]utling in reduction of 80-90% to 20% with excellent flow and without dissection  . PERIPHERAL VASCULAR ANGIOGRAM  09/10/2013   Occluded left SFA, heavily calcified with reconstitution at adductor canal. Three-vessel runof.;; Right lower extremity noted 50% ostial right common iliac stenosis with roughly 20 mm gradient. Not considered significant.f  . PERIPHERAL VASCULAR INTERVENTION Right 03/23/2019  Procedure: PERIPHERAL VASCULAR INTERVENTION;  Surgeon: [name], MD;  Location: [place] CV LAB;  Service: Cardiovascular;  Laterality: Right;  common iliac  . right hand surgery    . ROTATOR CUFF REPAIR Right   . Superficial Femoral Atery stent Right 1996  . TONSILLECTOMY     as child  . TRANSTHORACIC ECHOCARDIOGRAM  03/23/2013   Post-Op Echo #1: EF 50-55%, Mild Conc LVH, Mild HK of Inferolateral wall with paradoxical septal motion (c/w post-op state); Well seated 23 mm Bioprosthetic Valve (area ~1.23 cm2 - normal for valve); Mild MR  . TRANSTHORACIC ECHOCARDIOGRAM  March 2017   EF 50-55%. No RWMA.  GR 1 DD.  23 mm bioprosthetic valve functioning normally.  Mild biatrial dilation.  . TRANSTHORACIC ECHOCARDIOGRAM  08/2017   EF 50 and 55% with moderate LVH.  No regional wall motion normalities.  GR 1 DD.  Bioprosthetic aortic valve present -normal gradients.  [name] TRIGGER FINGER RELEASE  07/23/2011   Procedure: RELEASE TRIGGER FINGER/A-1 PULLEY;  Surgeon: Lorn Junes, MD;   Location: [place];  Service: Orthopedics;  Laterality: Left;  release trigger left ring finger patient had supraclavicular block in preop     Family History  Problem Relation Age of Onset  . Hypertension Mother   . Cancer Mother        Oral  . Stroke Father      Social History   Socioeconomic History  . Marital status: Married    Spouse name: Not on file  . Number of children: Not on file  . Years of education: Not on file  . Highest education level: Not on file  Occupational History  . Not on file  Tobacco Use  . Smoking status: Former Smoker    Packs/day: 0.50    Years: 60.00    Pack years: 30.00    Types: Cigarettes    Quit date: 01/31/2013    Years since quitting: 6.5  . Smokeless tobacco: Never Used  Substance and Sexual Activity  . Alcohol use: No    Alcohol/week: 0.0 standard drinks  . Drug use: No  . Sexual activity: Not on file  Other Topics Concern  . Not on file  Social History Narrative   Married with no children. He long-term smoker who quit prior to his aVR in 2014.    Up until his operation he worked part-time at [employer].  He is not yet gone back to work postoperatively.   He chose not be cardiac rehabilitation, but has gone back to walking exercising daily.   Social Determinants of Health   Financial Resource Strain:   . Difficulty of Paying Living Expenses: Not on file  Food Insecurity:   . Worried About Charity fundraiser in the Last Year: Not on file  . Ran Out of Food in the Last Year: Not on file  Transportation Needs:   . Lack of Transportation (Medical): Not on file  . Lack of Transportation (Non-Medical): Not on file  Physical Activity:   . Days of Exercise per Week: Not on file  . Minutes of Exercise per Session: Not on file  Stress:   . Feeling of Stress : Not on file  Social Connections:   . Frequency of Communication with Friends and Family: Not on file  . Frequency of Social Gatherings with  Friends and Family: Not on file  . Attends Religious Services: Not on file  . Active Member of Clubs or Organizations: Not  on file  . Attends Archivist Meetings: Not on file  . Marital Status: Not on file  Intimate Partner Violence:   . Fear of Current or Ex-Partner: Not on file  . Emotionally Abused: Not on file  . Physically Abused: Not on file  . Sexually Abused: Not on file     BP 126/74   Pulse 63   Ht 5\' 5"  (1.651 m)   Wt 157 lb (71.2 kg)   SpO2 95%   BMI 26.13 kg/m   Physical Exam:  Well appearing NAD HEENT: Unremarkable Neck:  No JVD, no thyromegally Lymphatics:  No adenopathy Back:  No CVA tenderness Lungs:  Clear with no wheezes HEART:  Regular rate rhythm, no murmurs, no rubs, no clicks Abd:  soft, positive bowel sounds, no organomegally, no rebound, no guarding Ext:  2 plus pulses, no edema, no cyanosis, no clubbing Skin:  No rashes no nodules Neuro:  CN II through XII intact, motor grossly intact  EKG - nsr with RBBB  DEVICE  Normal device function.  See PaceArt for details.   Assess/Plan: 1. VT - he has been well controlled. I have asked him to reduce his dose of amiodarone to 200 mg M-F, and none on Sat/Sun. 2. ICD - his St. Jude DDD ICD is working normally. We will recheck in several months 3. CAD - he remains active working in the yard. He is encouraged to increase his activity.  Mikle [email or web address].D.

## 2019-09-22 ENCOUNTER — Ambulatory Visit (INDEPENDENT_AMBULATORY_CARE_PROVIDER_SITE_OTHER): Payer: Medicare Other | Admitting: *Deleted

## 2019-09-22 DIAGNOSIS — I472 Ventricular tachycardia, unspecified: Secondary | ICD-10-CM

## 2019-09-22 LAB — CUP PACEART REMOTE DEVICE CHECK
Battery Remaining Longevity: 59 mo
Battery Remaining Percentage: 65 %
Battery Voltage: 2.95 V
Brady Statistic AP VP Percent: 10 %
Brady Statistic AP VS Percent: 82 %
Brady Statistic AS VP Percent: 1 %
Brady Statistic AS VS Percent: 7.6 %
Brady Statistic RA Percent Paced: 91 %
Brady Statistic RV Percent Paced: 10 %
Date Time Interrogation Session: 20210413081334
HighPow Impedance: 68 Ohm
HighPow Impedance: 68 Ohm
Implantable Lead Implant Date: 20170601
Implantable Lead Implant Date: 20170601
Implantable Lead Location: 753859
Implantable Lead Location: 753860
Implantable Lead Model: 7122
Implantable Pulse Generator Implant Date: 20171221
Lead Channel Impedance Value: 400 Ohm
Lead Channel Impedance Value: 440 Ohm
Lead Channel Pacing Threshold Amplitude: 0.625 V
Lead Channel Pacing Threshold Amplitude: 0.875 V
Lead Channel Pacing Threshold Pulse Width: 0.5 ms
Lead Channel Pacing Threshold Pulse Width: 0.5 ms
Lead Channel Sensing Intrinsic Amplitude: 3 mV
Lead Channel Sensing Intrinsic Amplitude: 3.4 mV
Lead Channel Setting Pacing Amplitude: 1.125
Lead Channel Setting Pacing Amplitude: 1.625
Lead Channel Setting Pacing Pulse Width: 0.5 ms
Lead Channel Setting Sensing Sensitivity: 0.5 mV
Pulse Gen Serial Number: 7390376

## 2019-09-23 NOTE — Progress Notes (Signed)
ICD Remote  

## 2019-10-03 ENCOUNTER — Other Ambulatory Visit: Payer: Self-pay | Admitting: Internal Medicine

## 2019-10-07 ENCOUNTER — Ambulatory Visit (HOSPITAL_BASED_OUTPATIENT_CLINIC_OR_DEPARTMENT_OTHER)
Admission: RE | Admit: 2019-10-07 | Discharge: 2019-10-07 | Disposition: A | Discharge status: Home / Self Care | Payer: Medicare Other | Source: Ambulatory Visit | Attending: Cardiovascular Disease | Admitting: Cardiovascular Disease

## 2019-10-07 ENCOUNTER — Other Ambulatory Visit (HOSPITAL_COMMUNITY): Payer: Self-pay | Admitting: Cardiovascular Disease

## 2019-10-07 ENCOUNTER — Ambulatory Visit (HOSPITAL_COMMUNITY)
Admission: RE | Admit: 2019-10-07 | Discharge: 2019-10-07 | Disposition: A | Discharge status: Home / Self Care | Payer: Medicare Other | Source: Ambulatory Visit | Attending: Internal Medicine | Admitting: Internal Medicine

## 2019-10-07 ENCOUNTER — Other Ambulatory Visit: Payer: Self-pay

## 2019-10-07 DIAGNOSIS — I70219 Atherosclerosis of native arteries of extremities with intermittent claudication, unspecified extremity: Secondary | ICD-10-CM

## 2019-10-07 DIAGNOSIS — Z95820 Peripheral vascular angioplasty status with implants and grafts: Secondary | ICD-10-CM | POA: Diagnosis present

## 2019-10-07 DIAGNOSIS — I739 Peripheral vascular disease, unspecified: Secondary | ICD-10-CM | POA: Insufficient documentation

## 2019-10-07 DIAGNOSIS — Z95828 Presence of other vascular implants and grafts: Secondary | ICD-10-CM

## 2019-10-09 ENCOUNTER — Telehealth: Payer: Self-pay

## 2019-10-09 DIAGNOSIS — I70219 Atherosclerosis of native arteries of extremities with intermittent claudication, unspecified extremity: Secondary | ICD-10-CM

## 2019-10-09 DIAGNOSIS — I739 Peripheral vascular disease, unspecified: Secondary | ICD-10-CM

## 2019-10-09 NOTE — Telephone Encounter (Signed)
Spoke to patient recent doppler results given.Advised to repeat in 12 months.

## 2019-12-22 ENCOUNTER — Ambulatory Visit (INDEPENDENT_AMBULATORY_CARE_PROVIDER_SITE_OTHER): Payer: Medicare Other | Admitting: *Deleted

## 2019-12-22 DIAGNOSIS — I428 Other cardiomyopathies: Secondary | ICD-10-CM | POA: Diagnosis not present

## 2019-12-22 LAB — CUP PACEART REMOTE DEVICE CHECK
Battery Remaining Longevity: 58 mo
Battery Remaining Percentage: 62 %
Battery Voltage: 2.95 V
Brady Statistic AP VP Percent: 10 %
Brady Statistic AP VS Percent: 82 %
Brady Statistic AS VP Percent: 1 %
Brady Statistic AS VS Percent: 7.8 %
Brady Statistic RA Percent Paced: 91 %
Brady Statistic RV Percent Paced: 10 %
Date Time Interrogation Session: 20210713075623
HighPow Impedance: 66 Ohm
HighPow Impedance: 66 Ohm
Implantable Lead Implant Date: 20170601
Implantable Lead Implant Date: 20170601
Implantable Lead Location: 753859
Implantable Lead Location: 753860
Implantable Lead Model: 7122
Implantable Pulse Generator Implant Date: 20171221
Lead Channel Impedance Value: 390 Ohm
Lead Channel Impedance Value: 460 Ohm
Lead Channel Pacing Threshold Amplitude: 0.875 V
Lead Channel Pacing Threshold Amplitude: 0.875 V
Lead Channel Pacing Threshold Pulse Width: 0.5 ms
Lead Channel Pacing Threshold Pulse Width: 0.5 ms
Lead Channel Sensing Intrinsic Amplitude: 3.1 mV
Lead Channel Sensing Intrinsic Amplitude: 3.1 mV
Lead Channel Setting Pacing Amplitude: 1.125
Lead Channel Setting Pacing Amplitude: 1.875
Lead Channel Setting Pacing Pulse Width: 0.5 ms
Lead Channel Setting Sensing Sensitivity: 0.5 mV
Pulse Gen Serial Number: 7390376

## 2019-12-23 NOTE — Progress Notes (Signed)
Remote ICD transmission.   

## 2020-02-12 ENCOUNTER — Encounter: Payer: Self-pay | Admitting: Cardiology

## 2020-02-12 ENCOUNTER — Ambulatory Visit (INDEPENDENT_AMBULATORY_CARE_PROVIDER_SITE_OTHER): Payer: Medicare Other | Admitting: Cardiology

## 2020-02-12 ENCOUNTER — Other Ambulatory Visit: Payer: Self-pay

## 2020-02-12 VITALS — BP 128/64 | HR 64 | Ht 65.0 in | Wt 150.0 lb

## 2020-02-12 DIAGNOSIS — Z9861 Coronary angioplasty status: Secondary | ICD-10-CM

## 2020-02-12 DIAGNOSIS — I472 Ventricular tachycardia, unspecified: Secondary | ICD-10-CM

## 2020-02-12 DIAGNOSIS — I10 Essential (primary) hypertension: Secondary | ICD-10-CM | POA: Diagnosis not present

## 2020-02-12 DIAGNOSIS — E785 Hyperlipidemia, unspecified: Secondary | ICD-10-CM

## 2020-02-12 DIAGNOSIS — I70219 Atherosclerosis of native arteries of extremities with intermittent claudication, unspecified extremity: Secondary | ICD-10-CM

## 2020-02-12 DIAGNOSIS — Z952 Presence of prosthetic heart valve: Secondary | ICD-10-CM

## 2020-02-12 DIAGNOSIS — I2510 Atherosclerotic heart disease of native coronary artery without angina pectoris: Secondary | ICD-10-CM | POA: Diagnosis not present

## 2020-02-12 NOTE — Progress Notes (Signed)
Primary Care Provider: [name], MD Cardiologist: Glenetta Hew, MD  Vascular IC Cardiologist: Dr. Gwenlyn Found; vascular surgery: Dr. Trula Slade Electrophysiologist: Dr. [name] Gastroenterologist: Dr. Hilarie Fredrickson Nephrologist: Dr. Murlean Iba  Clinic Note: Chief Complaint  Patient presents with  . Follow-up    No major complaints  . Coronary Artery Disease    No angina  . PAD    Active claudication, but not interested in further evaluation and treatment   HPI:    Ralph Drake is a 85 y.o. male with a PMH CAD (moderate-severe), PAD (limiting claudication) along with Cardiomyopathy (combined ischemic/valvular, s/p ICD for sustained VT) with history of AVR with postop A. fib, presented for annual follow-up.   CAD (coronary angioplasty x 2 1993)  Cath May 2017: Mod (mod-severe CAD): p-mCx 50%. mLAD ~40%, mRCA ~40%. dRCA ~70%, No culprit lesion for VT.  PAD (SFA stent 1996, SFA occlusion 2015) ,   September 04, 2017 ABIs: (Right 0.74, left 0.70) right ABIs decreased compared to 2014 but left is stable. --Indicates moderate bilateral lower extremity arterial disease. -->  Duplex: Hard plaque throughout. ->  Abdominal aortic duplex was recommended. (Abdominal duplex was inadequate)  H/o AS: s/p AVR (23 [facility] Ease Bovine Pericardial Tissue Valve 2014) with Cardiomyopathy (mixed Valvular & Ischemic).   2D Echo August 22, 2017: EF 50 and 55% with moderate LVH.  No regional wall motion normalities.  GR 1 DD.  Bioprosthetic aortic valve present -normal gradients.  Recurrent VT -- s/p ICD - multiple shocks -- > ICD Implant 11/10/2015 (Dr. [name]) - St. Jude ICD L Ax. V.; readmitted for frequent VT - new ICD  re-implant for device failure 05/2016. (Dr. [name])  Postop atrial fib -on amiodarone.  Not on [place]. -Was only noted postop  He has known solitary kidney - CKD III along with HTN, HLD.   Ralph Drake was last seen on 02/11/2019 -> he was doing relatively well overall from a cardiac  standpoint.  No major issues.  Only able to walk about 75 yards without definitely noticing claudication pain in his calves.  He has to stop to continue to rest but he still gets around.  After he rests he walks again.  He does yard work, but if he tries to carry the blow on his back, he will get short of breath.  Otherwise does not really note exertional dyspnea.  No palpitations.  Not interested in discussing lipid medications.  Recent Hospitalizations: None  Seen by Dr. Gwenlyn Found for history of vascular disease in November 2020 following PV angiography revealing significant ostial right common iliac stenosis and high-grade calcified right common femoral artery stenosis with proximal mid and distal RSFA and popliteal disease with three-vessel runoff. -> October 2020: Viabahn covered stent of right common iliac ostium-unfortunately claudication symptoms were not changed -> referred back to Dr. Trula Slade for possible right common femoral endarterectomy and patch angioplasty.  (He chose to forego further procedures)  Most recent visit with Dr. [name] on 08/07/2019 follow-up for his ICD. Was doing well. No chest pain with stable Class II dyspnea. Active work. No ICD therapies noted. --> Amiodarone dose reduced to 200 mg Monday through Friday and none on Saturday and Sunday.  Reviewed  CV studies:    The following studies were reviewed today: (if available, images/films reviewed: From Epic Chart or Care Everywhere) . Abdominal aortic and PV ultrasounds performed (see Epic):   Interval History:   Ralph Drake returns today for cardiac standpoint doing fairly well.  He definitely is limited by claudication.  He really does not walk for exercise, but is able to do yard work and grocery shopping.  If he does any extensive walking he will like to stop because of calf claudication.  He decided that he did not want to go through with surgical evaluation indicating that he was not interested in surgical  procedures.  He has been a little bit more active of late than usual because his wife has been quite ill.  Now he is having to do all the inside house chores as well as yard work.  He is a little more tired now because he is doing more activities, but is actually feeling better overall.  No change in his baseline dyspnea.  No chest pain or pressure.  CV Review of Symptoms (Summary) : positive for - Baseline exertional dyspnea but no real change, most limited by right greater than left calf claudication negative for - chest pain, edema, irregular heartbeat, orthopnea, palpitations, paroxysmal nocturnal dyspnea, rapid heart rate, shortness of breath or Lightheadedness or dizziness, syncope/near syncope, TIA/amaurosis fugax, medication  The patient does not have symptoms concerning for COVID-19 infection (fever, chills, cough, or new shortness of breath).   REVIEWED OF SYSTEMS   Per HPI or otherwise negative if not noted:  Baseline shortness of breath and claudication.  Normal arthritis pains.  Occasional dizziness with position change at night.  Some memory loss.  I have reviewed and (if needed) personally updated the patient's problem list, medications, allergies, past medical and surgical history, social and family history.   PAST MEDICAL HISTORY   Past Medical History:  Diagnosis Date  . Bradycardia   . CAD S/P percutaneous coronary angioplasty 1993   POBA to Cx x 2 occasions: 10/2015:Mod (mod-severe CAD): p-mCx 50%. mLAD ~40%, mRCA ~40%. dRCA ~70%, No culprit lesion for VT.  [name] Dyslipidemia, goal LDL below 70   . Former heavy tobacco smoker     quit in October 2014  . GERD (gastroesophageal reflux disease)   . Hypertension   . Olecranon bursitis of left elbow   . Osteoarthritis   . Peripheral vascular disease ([place])     H/O R SFA stent, L Iliac Stent 1996;;  Lower Extremity Angiography April 2015: Occluded left SFA reconstituting at end of the canal, heavily calcified. Three-vessel  runoff..;; RCA 50% ostial right common iliac, patent SFA stent -> Referred to Dr. Trula Slade of [facility].  . Postoperative atrial fibrillation (HCC)    No recurrence  . S/P AVR (aortic valve replacement) 02/10/2013   23 [employer] Magna Ease Pericardial Tissue Valve  . Severe aortic stenosis by prior echocardiogram    per Dr. Rollene Fare 2012; referred for aortic valve replacement  . Shortness of breath    Chronic  . Trigger ring finger of left hand   . Tubular adenoma of colon   . VT (ventricular tachycardia) ([place])    hemodynamically significant VT on 11/07/2015, nonobstructive CAD, no culprit lesion, underwent St Jude Dual Chamber ICD by Dr. [name]    PAST SURGICAL HISTORY   Past Surgical History:  Procedure Laterality Date  . ABDOMINAL ANGIOGRAM  09/10/2013   Procedure: ABDOMINAL ANGIOGRAM;  Surgeon: [name], MD;  Location: [facility] CATH LAB;  Service: Cardiovascular;;  . ABDOMINAL AORTOGRAM N/A 03/23/2019   Procedure: ABDOMINAL AORTOGRAM;  Surgeon: [name], MD;  Location: [place] CV LAB;  Service: Cardiovascular;  Laterality: N/A;  . AORTIC VALVE REPLACEMENT N/A 02/10/2013   AORTIC VALVE REPLACEMENT (  AVR);  Surgeon: Grace Isaac, MD;  Location: [facility] OR;  Service: Open Heart Surgery:  Dr. Servando Snare: [address] Ease Pericardia  . ARTERIAL DOPPLER - PRE-CABG  02/06/2013   Bilateral 1-39% ICA stenosis  . CARDIAC CATHETERIZATION  August 2014   Nonobstructive 30-50% RCA. No significant LAD disease. 30% mid circumflex.  [name] CARDIAC CATHETERIZATION  11/10/2009   No intervention - recommend medical management  . CARDIAC CATHETERIZATION N/A 11/09/2015   Procedure: Left Heart Cath and Coronary Angiography;  Surgeon: Belva Crome, MD;  Location: [facility] INVASIVE CV LAB: Mod (mod-severe CAD): p-mCx 50%. mLAD ~40%, mRCA ~40%. dRCA ~70%, No culprit lesion for VT.  [name] CARDIOVASCULAR STRESS TEST  06/24/2012   Diffuse LV hypokinesis with moderately depressed systolic function and EF 37%,  .  CORONARY ANGIOPLASTY  1993   Circumflex x 2   . EP IMPLANTABLE DEVICE N/A 11/10/2015   Procedure: ICD Implant;  Surgeon: Evans Lance, MD;  Location: [place] CV LAB;  Service: Cardiovascular;  Laterality: N/A;  . ICD GENERATOR CHANGEOUT N/A 05/31/2016   Procedure: ICD Generator Changeout;  Surgeon: Thompson Grayer, MD;  Location: [place] CV LAB;  Service: Cardiovascular;  Laterality: N/A;  . ILIAC ARTERY STENT Left 1996  . INTRAOPERATIVE TRANSESOPHAGEAL ECHOCARDIOGRAM N/A 02/10/2013   Procedure: INTRAOPERATIVE TRANSESOPHAGEAL ECHOCARDIOGRAM;  Surgeon: Grace Isaac, MD;  Location: [place];  Service: Open Heart Surgery;  Laterality: N/A;  . LEFT AND RIGHT HEART CATHETERIZATION WITH CORONARY ANGIOGRAM N/A 01/28/2013   Procedure: LEFT AND RIGHT HEART CATHETERIZATION WITH CORONARY ANGIOGRAM;  Surgeon: Leonie Man, MD;  Location: [facility] CATH LAB;  Service: Cardiovascular;  Laterality: N/A;  . LOWER EXTREMITY ANGIOGRAM N/A 09/10/2013   Procedure: LOWER EXTREMITY ANGIOGRAM;  Surgeon: [name], MD;  Location: [facility] CATH LAB;  Service: Cardiovascular;  Laterality: N/A;  . LOWER EXTREMITY ANGIOGRAPHY Right 03/23/2019   Procedure: Lower Extremity Angiography;  Surgeon: [name], MD;  Location: [place] CV LAB;  Service: Cardiovascular;  Laterality: Right;  . OLECRANON BURSECTOMY  07/23/2011   Procedure: OLECRANON BURSA;  Surgeon: Lorn Junes, MD;  Location: [place];  Service: Orthopedics;  Laterality: Left;  excision olecranon bursa left elbow patient had supraclaviular block in preop  . PERIPHERAL VASCULAR ANGIOGRAM  07/19/1994   No intervention-recommend medical therapy  . PERIPHERAL VASCULAR ANGIOGRAM  01/17/1995   Right SFA 80-90# lesion, dilatation performed with a Cordis "Optz 5" 31mm x 2cm, inflated a 4-7.5atm for 40 to 70 seconds. Completion angiogram done by hand with "bolus chase" runoff from Primal SFA to foot. [place] in reduction of 80-90% to 20% with  excellent flow and without dissection  . PERIPHERAL VASCULAR ANGIOGRAM  09/10/2013   Occluded left SFA, heavily calcified with reconstitution at adductor canal. Three-vessel runof.;; Right lower extremity noted 50% ostial right common iliac stenosis with roughly 20 mm gradient. Not considered significant.f  . PERIPHERAL VASCULAR INTERVENTION Right 03/23/2019   Procedure: PERIPHERAL VASCULAR INTERVENTION;  Surgeon: [name], MD;  Location: [place] CV LAB;  Service: Cardiovascular;  Laterality: Right;  common iliac  . right hand surgery    . ROTATOR CUFF REPAIR Right   . Superficial Femoral Atery stent Right 1996  . TONSILLECTOMY     as child  . TRANSTHORACIC ECHOCARDIOGRAM  03/23/2013   Post-Op Echo #1: EF 50-55%, Mild Conc LVH, Mild HK of Inferolateral wall with paradoxical septal motion (c/w post-op state); Well seated 23 mm Bioprosthetic Valve (area ~1.23 cm2 -  normal for valve); Mild MR  . TRANSTHORACIC ECHOCARDIOGRAM  March 2017   EF 50-55%. No RWMA.  GR 1 DD.  23 mm bioprosthetic valve functioning normally.  Mild biatrial dilation.  . TRANSTHORACIC ECHOCARDIOGRAM  08/2017   EF 50 and 55% with moderate LVH.  No regional wall motion normalities.  GR 1 DD.  Bioprosthetic aortic valve present -normal gradients.  [name] TRIGGER FINGER RELEASE  07/23/2011   Procedure: RELEASE TRIGGER FINGER/A-1 PULLEY;  Surgeon: Lorn Junes, MD;  Location: [place];  Service: Orthopedics;  Laterality: Left;  release trigger left ring finger patient had supraclavicular block in preop    Immunization History  Administered Date(s) Administered  . Influenza Inj Mdck Quad Pf 03/05/2016  . Influenza Split 04/11/2010  . PFIZER SARS-COV-2 Vaccination 06/24/2019, 07/15/2019  . Pneumococcal Polysaccharide-23 03/07/2017    MEDICATIONS/ALLERGIES   Current Meds  Medication Sig  . acetaminophen (TYLENOL) 500 MG tablet Take 500 mg by mouth every 6 (six) hours as needed for moderate pain or  headache.  [name] amiodarone (PACERONE) 200 MG tablet Take one tablet by mouth daily Monday through Friday.  Do NOT take on Saturday or Sunday.  [name] amLODipine (NORVASC) 10 MG tablet Take 1 tablet (10 mg total) by mouth daily.  [name] aspirin 81 MG EC tablet Take 1 tablet (81 mg total) by mouth daily.  [name] atorvastatin (LIPITOR) 80 MG tablet Take 1 tablet (80 mg total) by mouth daily at 6 PM.  . cilostazol (PLETAL) 100 MG tablet Take 1 tablet (100 mg total) by mouth 2 (two) times daily.  . clopidogrel (PLAVIX) 75 MG tablet Take 1 tablet (75 mg total) by mouth daily.  [name] ezetimibe (ZETIA) 10 MG tablet Take 1 tablet (10 mg total) by mouth daily.  . fish oil-omega-3 fatty acids 1000 MG capsule Take 1 g by mouth daily.   [name] levothyroxine (SYNTHROID, LEVOTHROID) 50 MCG tablet Take 50 mcg by mouth daily before breakfast.  . lisinopril (ZESTRIL) 10 MG tablet TAKE 1 TABLET BY MOUTH  DAILY  . loratadine (CLARITIN) 10 MG tablet Take 10 mg by mouth daily as needed for allergies.  . Multiple Vitamins-Minerals (MULTIVITAMIN WITH MINERALS) tablet Take 1 tablet by mouth daily.  . vitamin E 400 UNIT capsule Take 2 capsules by mouth daily.    No Known Allergies  SOCIAL HISTORY/FAMILY HISTORY   Reviewed in Epic:  Pertinent findings: Wife hurt her back - he is doing house & yard chores.    OBJCTIVE -PE, EKG, labs   Wt Readings from Last 3 Encounters:  02/12/20 150 lb (68 kg)  08/07/19 157 lb (71.2 kg)  04/14/19 153 lb (69.4 kg)    Physical Exam: BP 128/64   Pulse 64   Ht 5\' 5"  (1.651 m)   Wt 150 lb (68 kg)   SpO2 92%   BMI 24.96 kg/m  Physical Exam Constitutional:      General: He is not in acute distress.    Appearance: Normal appearance. He is normal weight. He is not ill-appearing.  HENT:     Head: Normocephalic and atraumatic.  Neck:     Vascular: Carotid bruit (R>L) present.  Cardiovascular:     Rate and Rhythm: Normal rate and regular rhythm. Occasional extrasystoles are present.    Chest Wall:  PMI is not displaced.     Pulses: Decreased pulses.          Carotid pulses are 2+ on the right side with bruit and 2+ on the  left side.      Radial pulses are 2+ on the right side and 2+ on the left side.       Femoral pulses are 2+ on the right side and 2+ on the left side.      Popliteal pulses are 1+ on the right side and 2+ on the left side.       Dorsalis pedis pulses are 1+ on the right side and 1+ on the left side.       Posterior tibial pulses are 1+ on the right side and 1+ on the left side.     Heart sounds: Murmur (Soft 1/6 SEM at RUSB) heard.  No friction rub. No gallop.   Pulmonary:     Effort: Pulmonary effort is normal. No respiratory distress.     Breath sounds: No wheezing, rhonchi (Few crackles) or rales.  Musculoskeletal:        General: No swelling. Normal range of motion.     Cervical back: Normal range of motion.  Neurological:     General: No focal deficit present.     Mental Status: He is alert and oriented to person, place, and time.  Psychiatric:        Mood and Affect: Mood normal.        Behavior: Behavior normal.        Thought Content: Thought content normal.        Judgment: Judgment normal.       Adult ECG Report n/a  Recent Labs:  - in Care Everywhere- last lipid in 2019   01/25/2020: Na+133, K+5.4, Cr 1.5.  (Normal CBC H/H 14.2/43.3); glucose 96 Lab Results  Component Value Date   CHOL 178 02/07/2015   HDL 51 02/07/2015   LDLCALC 99 02/07/2015   TRIG 141 02/07/2015   CHOLHDL 3.5 02/07/2015    Lab Results  Component Value Date   TSH 7.38 (H) 12/14/2015    ASSESSMENT/PLAN    Problem List Items Addressed This Visit    S/P tissue AVR 02/11/13 (Chronic)    Stable murmur on exam.  Can follow-up echo next year (okay to do every 3 years as opposed to 2)      CAD S/P PTCA of CFX X 2 in '93 (Chronic)    Distant history of angioplasty to the LCx back in the 90s.  Most recent cath was in May 2017 showing moderate disease most notably 70%  distal RCA. No anginal, heart failure symptoms or further arrhythmias.  Plan:   Continue aspirin/Plavix (as much for PAD as CAD), high-dose statin-Zetia, and amlodipine plus lisinopril.  Not on beta-blocker because of amiodarone.      Relevant Orders   Lipid panel   Hepatic function panel   Essential hypertension (Chronic)    Stable blood pressure on amlodipine lisinopril.      Atherosclerotic peripheral vascular disease with intermittent claudication (HCC) (Chronic)    Notable right-sided claudication.  Was referred for right common femoral artery endarterectomy, but he chose to avoid further procedures.  Continue to recommend walking as tolerated. Continue Pletal along with aspirin and Plavix      Dyslipidemia, goal LDL below 70 - Primary (Chronic)    Continues to be on high-dose statin plus Zetia and omega-3 fatty acids.  Not interested in trying PCSK9 inhibitors. Unfortunately, I do not see any recent lipids being checked. Plan: Recheck lipids and LFTs.      Relevant Orders   Lipid panel   Hepatic function panel  Sustained ventricular tachycardia (HCC) (Chronic)    Status post ICD with no further episodes.  Remains on amiodarone for suppression.  ICD followed by Dr. [name].  Continue to recommend annual eye exams, and LFTs. Has baseline hypothyroidism and therefore on levothyroxine replacement. We discussed PFTs, as long as he is not having any dyspnea symptoms, will hold off.            COVID-19 Education: The signs and symptoms of COVID-19 were discussed with the patient and how to seek care for testing (follow up with PCP or arrange E-visit).   The importance of social distancing and COVID-19 vaccination was discussed today. 2 min The patient is practicing social distancing & Masking.   I spent a total of 28 minutes with the patient spent in direct patient consultation.  Additional time spent with chart review  / charting (studies, outside notes, etc):  8 Total Time: 36 min   Current medicines are reviewed at length with the patient today.  (+/- concerns) none  Notice: This dictation was prepared with Dragon dictation along with smaller phrase technology. Any transcriptional errors that result from this process are unintentional and may not be corrected upon review.  Patient Instructions / Medication Changes & Studies & Tests Ordered   Patient Instructions  Medication Instructions:  Continue same medications *If you need a refill on your cardiac medications before your next appointment, please call your pharmacy*   Lab Work: Lipid and Hepatic panels    Testing/Procedures: None ordered   Follow-Up: At [facility], you and your health needs are our priority.  As part of our continuing mission to provide you with exceptional heart care, we have created designated Provider Care Teams.  These Care Teams include your primary Cardiologist (physician) and Advanced Practice Providers (APPs -  Physician Assistants and Nurse Practitioners) who all work together to provide you with the care you need, when you need it.  We recommend signing up for the patient portal called "MyChart".  Sign up information is provided on this After Visit Summary.  MyChart is used to connect with patients for Virtual Visits (Telemedicine).  Patients are able to view lab/test results, encounter notes, upcoming appointments, etc.  Non-urgent messages can be sent to your provider as well.   To learn more about what you can do with MyChart, go to [email or web address].    Your next appointment:  1 year    Call in July to schedule Sept    The format for your next appointment: Office   Provider:  Dr.Jocelynn Gioffre  Let eye Dr. know you take Amiodarone    Studies Ordered:   Orders Placed This Encounter  Procedures  . Lipid panel  . Hepatic function panel     Glenetta Hew, M.D., M.S. Interventional Cardiologist   Pager # [phone] Phone #  [phone] [address] [place], [place] [address]   Thank you for choosing Heartcare at [facility]!!

## 2020-02-12 NOTE — Patient Instructions (Signed)
Medication Instructions:  Continue same medications *If you need a refill on your cardiac medications before your next appointment, please call your pharmacy*   Lab Work: Lipid and Hepatic panels    Testing/Procedures: None ordered   Follow-Up: At [facility], you and your health needs are our priority.  As part of our continuing mission to provide you with exceptional heart care, we have created designated Provider Care Teams.  These Care Teams include your primary Cardiologist (physician) and Advanced Practice Providers (APPs -  Physician Assistants and Nurse Practitioners) who all work together to provide you with the care you need, when you need it.  We recommend signing up for the patient portal called "MyChart".  Sign up information is provided on this After Visit Summary.  MyChart is used to connect with patients for Virtual Visits (Telemedicine).  Patients are able to view lab/test results, encounter notes, upcoming appointments, etc.  Non-urgent messages can be sent to your provider as well.   To learn more about what you can do with MyChart, go to [email or web address].    Your next appointment:  1 year    Call in July to schedule Sept    The format for your next appointment: Office   Provider:  Dr.Harding  Let eye Dr. know you take Amiodarone

## 2020-02-21 ENCOUNTER — Encounter: Payer: Self-pay | Admitting: Cardiology

## 2020-02-21 NOTE — Assessment & Plan Note (Signed)
Distant history of angioplasty to the LCx back in the 90s.  Most recent cath was in May 2017 showing moderate disease most notably 70% distal RCA. No anginal, heart failure symptoms or further arrhythmias.  Plan:   Continue aspirin/Plavix (as much for PAD as CAD), high-dose statin-Zetia, and amlodipine plus lisinopril.  Not on beta-blocker because of amiodarone.

## 2020-02-21 NOTE — Assessment & Plan Note (Signed)
Stable blood pressure on amlodipine lisinopril.

## 2020-02-21 NOTE — Assessment & Plan Note (Signed)
Continues to be on high-dose statin plus Zetia and omega-3 fatty acids.  Not interested in trying PCSK9 inhibitors. Unfortunately, I do not see any recent lipids being checked. Plan: Recheck lipids and LFTs.

## 2020-02-21 NOTE — Assessment & Plan Note (Signed)
Stable murmur on exam.  Can follow-up echo next year (okay to do every 3 years as opposed to 2)

## 2020-02-21 NOTE — Assessment & Plan Note (Signed)
Status post ICD with no further episodes.  Remains on amiodarone for suppression.  ICD followed by Dr. [name].  Continue to recommend annual eye exams, and LFTs. Has baseline hypothyroidism and therefore on levothyroxine replacement. We discussed PFTs, as long as he is not having any dyspnea symptoms, will hold off.

## 2020-02-21 NOTE — Assessment & Plan Note (Signed)
Notable right-sided claudication.  Was referred for right common femoral artery endarterectomy, but he chose to avoid further procedures.  Continue to recommend walking as tolerated. Continue Pletal along with aspirin and Plavix

## 2020-02-24 ENCOUNTER — Other Ambulatory Visit
Admission: RE | Admit: 2020-02-24 | Discharge: 2020-02-24 | Disposition: A | Discharge status: Home / Self Care | Payer: Medicare Other | Attending: Cardiology | Admitting: Cardiology

## 2020-02-24 DIAGNOSIS — E785 Hyperlipidemia, unspecified: Secondary | ICD-10-CM | POA: Diagnosis present

## 2020-02-24 LAB — HEPATIC FUNCTION PANEL
ALT: 18 U/L (ref 0–44)
AST: 25 U/L (ref 15–41)
Albumin: 4.5 g/dL (ref 3.5–5.0)
Alkaline Phosphatase: 73 U/L (ref 38–126)
Bilirubin, Direct: <0.1 mg/dL (ref 0.0–0.2)
Total Bilirubin: 0.7 mg/dL (ref 0.3–1.2)
Total Protein: 7.4 g/dL (ref 6.5–8.1)

## 2020-02-24 LAB — LIPID PANEL
Cholesterol: 183 mg/dL (ref 0–200)
HDL: 62 mg/dL (ref >40)
LDL Cholesterol: 102 mg/dL — ABNORMAL HIGH (ref 0–99)
Total CHOL/HDL Ratio: 3 RATIO
Triglycerides: 96 mg/dL (ref <150)
VLDL: 19 mg/dL (ref 0–40)

## 2020-03-01 ENCOUNTER — Telehealth: Payer: Self-pay | Admitting: *Deleted

## 2020-03-01 NOTE — Telephone Encounter (Signed)
-----   Message from Leonie Man, MD sent at 02/25/2020  6:02 PM EDT ----- Cholesterols levels look pretty much the same now as it did 5 years ago.  LDL is still not at goal at 102.  Goal is < 70.  He is not interested in PCSK9 inhibitors.  Maybe we can consider Nexletol.  If he is interested, I think we can consider adding the Nexletol.  Glenetta Hew, MD

## 2020-03-01 NOTE — Telephone Encounter (Signed)
Follow up  ° ° °Pt is returning call  ° ° °Please call back  °

## 2020-03-01 NOTE — Telephone Encounter (Signed)
Called and spoke to wife. Patient not present.  RN asked wife to to have patient to call back for results

## 2020-03-01 NOTE — Telephone Encounter (Signed)
Spoke  to patient . He verbalized understanding. Patient was in agreement to start new medication will notify Dr harding and contact patient

## 2020-03-07 ENCOUNTER — Encounter: Payer: Self-pay | Admitting: *Deleted

## 2020-03-22 ENCOUNTER — Ambulatory Visit (INDEPENDENT_AMBULATORY_CARE_PROVIDER_SITE_OTHER): Payer: Medicare Other

## 2020-03-22 DIAGNOSIS — I472 Ventricular tachycardia, unspecified: Secondary | ICD-10-CM

## 2020-03-23 LAB — CUP PACEART REMOTE DEVICE CHECK
Battery Remaining Longevity: 55 mo
Battery Remaining Percentage: 60 %
Battery Voltage: 2.93 V
Brady Statistic AP VP Percent: 10 %
Brady Statistic AP VS Percent: 80 %
Brady Statistic AS VP Percent: 1 %
Brady Statistic AS VS Percent: 9.7 %
Brady Statistic RA Percent Paced: 89 %
Brady Statistic RV Percent Paced: 10 %
Date Time Interrogation Session: 20211012081506
HighPow Impedance: 61 Ohm
HighPow Impedance: 61 Ohm
Implantable Lead Implant Date: 20170601
Implantable Lead Implant Date: 20170601
Implantable Lead Location: 753859
Implantable Lead Location: 753860
Implantable Lead Model: 7122
Implantable Pulse Generator Implant Date: 20171221
Lead Channel Impedance Value: 390 Ohm
Lead Channel Impedance Value: 450 Ohm
Lead Channel Pacing Threshold Amplitude: 0.75 V
Lead Channel Pacing Threshold Amplitude: 0.875 V
Lead Channel Pacing Threshold Pulse Width: 0.5 ms
Lead Channel Pacing Threshold Pulse Width: 0.5 ms
Lead Channel Sensing Intrinsic Amplitude: 2.8 mV
Lead Channel Sensing Intrinsic Amplitude: 3 mV
Lead Channel Setting Pacing Amplitude: 1.125
Lead Channel Setting Pacing Amplitude: 1.75 V
Lead Channel Setting Pacing Pulse Width: 0.5 ms
Lead Channel Setting Sensing Sensitivity: 0.5 mV
Pulse Gen Serial Number: 7390376

## 2020-03-24 ENCOUNTER — Telehealth: Payer: Self-pay | Admitting: Student

## 2020-03-24 NOTE — Telephone Encounter (Signed)
Scheduled remote reviewed and showed episodes of atrial noise reversion on 9/22.   Pt denies symptoms, but states a power line fell in [place] that day and he was up and down a ladder trying to get it back up. Denies lightheadedness, dizziness, syncope, or near syncope. No specific trauma to the area.   I spoke with patient and made an in person follow up to ensure proper function of his ICD. He has not had any noise since that day.   Legrand Como [address] [place], PA-C  03/24/2020 9:33 AM

## 2020-03-25 NOTE — Progress Notes (Signed)
Remote ICD transmission.   

## 2020-04-06 ENCOUNTER — Ambulatory Visit (INDEPENDENT_AMBULATORY_CARE_PROVIDER_SITE_OTHER): Payer: Medicare Other | Admitting: Internal Medicine

## 2020-04-06 ENCOUNTER — Telehealth: Payer: Self-pay | Admitting: *Deleted

## 2020-04-06 ENCOUNTER — Encounter: Payer: Self-pay | Admitting: Internal Medicine

## 2020-04-06 VITALS — BP 148/80 | HR 60 | Ht 65.0 in | Wt 150.0 lb

## 2020-04-06 DIAGNOSIS — K529 Noninfective gastroenteritis and colitis, unspecified: Secondary | ICD-10-CM | POA: Diagnosis not present

## 2020-04-06 DIAGNOSIS — I70219 Atherosclerosis of native arteries of extremities with intermittent claudication, unspecified extremity: Secondary | ICD-10-CM | POA: Diagnosis not present

## 2020-04-06 DIAGNOSIS — Z7902 Long term (current) use of antithrombotics/antiplatelets: Secondary | ICD-10-CM | POA: Diagnosis not present

## 2020-04-06 DIAGNOSIS — Z86010 Personal history of colonic polyps: Secondary | ICD-10-CM | POA: Diagnosis not present

## 2020-04-06 MED ORDER — SUTAB 1479-225-188 MG PO TABS: 1.0000 | ORAL_TABLET | ORAL | 0 refills | Status: DC

## 2020-04-06 NOTE — Telephone Encounter (Signed)
Request for surgical clearance:     Endoscopy Procedure  What type of surgery is being performed?     colonoscopy  When is this surgery scheduled?     05/23/20  What type of clearance is required ?   Pharmacy  Are there any medications that need to be held prior to surgery and how long? Plavix 5 days, Pletal 5 days  Practice name and name of physician performing surgery?      [facility]  What is your office phone and fax number?      Phone- [phone]  [phone]  Anesthesia type (None, local, MAC, general) ?       MAC

## 2020-04-06 NOTE — Telephone Encounter (Signed)
I have spoken to patient to advise that per cardiology, he may hold both plavix and pletal 5 days prior to his upcoming colonoscopy. Patient verbalizes understanding of this information.

## 2020-04-06 NOTE — Telephone Encounter (Signed)
Yes to both  Glenetta Hew, MD

## 2020-04-06 NOTE — Progress Notes (Signed)
   Subjective:    Patient ID: Ralph Drake, male    DOB: [birth year], 85 y.o.   MRN: [id number]  HPI Ralph Drake is an 85 year old male with PMH of multiple adenomatous colon polyps, history of V. tach status post ICD placement, CAD, hypertension, hyperlipidemia who is seen in consult at the request of Dr. Kary Kos to evaluate chronic diarrhea.  He is here alone today.  I performed a colonoscopy for surveillance for him in April 2018.  This revealed 9 polyps ranging in size from 4 to 8 mm which were removed throughout the colon.  There was sigmoid diverticulosis.  These polyps were found to be adenomatous and sessile serrated polyps.  He reports that about 4 months ago he developed diarrhea.  Symptoms started somewhat abruptly and have persisted.  He saw primary care who performed infectious stool studies which were negative.  He previously was having regular formed bowel movements 1-2 times per day but with this he was having 5-6 bowel movements during the day and night.  Stools were liquid and difficult to control.  Never bloody or melenic.  He has been using Pepto-Bismol at least 3 times per day and stools have decreased some.  They remain loose and at least 3 or 4 times per day.  There is never been abdominal pain.  No upper GI or hepatobiliary complaint.  Good appetite.   Review of Systems As per HPI, otherwise negative  Current Medications, Allergies, Past Medical History, Past Surgical History, Family History and Social History were reviewed in [employer] record.     Objective:   Physical Exam BP (!) 148/80   Pulse 60   Ht 5\' 5"  (1.651 m)   Wt 150 lb (68 kg)   BMI 24.96 kg/m  Gen: awake, alert, NAD HEENT: anicteric, op clear CV: RRR, no mrg Pulm: CTA b/l Abd: soft, NT/ND, +BS throughout Ext: no c/c/e Neuro: nonfocal  C diff neg Parasites neg Stool profile infectious negative     Assessment & Plan:  85 year old male with PMH of multiple  adenomatous colon polyps, history of V. tach status post ICD placement, CAD, hypertension, hyperlipidemia who is seen in consult at the request of Dr. Kary Kos to evaluate chronic diarrhea.   1.  Chronic diarrhea --diarrhea now chronic lasting over 3 months.  His infectious panel was negative.  I recommend that we repeat colonoscopy to evaluate for colitis/microscopic colitis.  We have previously decided to discontinue colonoscopic surveillance of colon polyps but given that we are repeating this test for another reason should be fine polyps they will be removed.  We discussed that he can continue Pepto-Bismol for symptomatic treatment but would stop 1 week prior to colonoscopy.  We will communicate with Dr. [name] regarding holding his Plavix and Pletal.  --Colonoscopy in the [place]  Hold Plavix and Pletal 5 days before procedure - will instruct when and how to resume after procedure. Risks and benefits of procedure including bleeding, perforation, infection, missed lesions, medication reactions and possible hospitalization or surgery if complications occur explained. Additional rare but real risk of cardiovascular event such as heart attack or ischemia/infarct of other organs off Plavix and Pletal explained and need to seek urgent help if this occurs. Will communicate by phone or EMR with patient's prescribing provider that to confirm holding Plavix and Pletal is reasonable in this case.   2.  History of multiple adenomatous and sessile serrated colon polyps --as above

## 2020-04-06 NOTE — Telephone Encounter (Signed)
Hi. Dr. [name],  Mr. Orth is scheduled for a colonoscopy on 05/23/2020 and his being asked to hold Plavix and Pletal. He has a history of moderate/severe CAD s/p angioplasty x2 in 1993, PAD s/p SFA stent in 1996 but occluded since 2015, AS s/p AVR, recurrent VT s/p ICD, and post-op atrial fibrillation. He was last seen by you in 02/2020 at which time he was doing relatively well but was limited by claudication. No changes in baseline dyspnea and no chest pain. Can he hold Plavix and Pletal for 5 days prior to colonoscopy?  Please route response back to P CV DIV PREOP.  Thank you! Manami Tutor

## 2020-04-06 NOTE — Patient Instructions (Signed)
You have been scheduled for a colonoscopy. Please follow written instructions given to you at your visit today.  Please pick up your prep supplies at the pharmacy within the next 1-3 days. If you use inhalers (even only as needed), please bring them with you on the day of your procedure.  Please discontinue Pepto Bismol 1 week prior to colonoscopy.  If you are age 83 or older, your body mass index should be between 23-30. Your Body mass index is 24.96 kg/m. If this is out of the aforementioned range listed, please consider follow up with your Primary Care Provider.  Due to recent changes in healthcare laws, you may see the results of your imaging and laboratory studies on MyChart before your provider has had a chance to review them.  We understand that in some cases there may be results that are confusing or concerning to you. Not all laboratory results come back in the same time frame and the provider may be waiting for multiple results in order to interpret others.  Please give [place] 48 hours in order for your provider to thoroughly review all the results before contacting the office for clarification of your results.

## 2020-04-06 NOTE — Telephone Encounter (Signed)
I have left a message for patient to call back. 

## 2020-04-07 NOTE — Telephone Encounter (Signed)
   Primary Cardiologist: Glenetta Hew, MD  Chart reviewed as part of pre-operative protocol coverage.  Per Dr. [name], patient can hold plavix and pletal for 5 days prior to upcoming colonoscopy. Both medications should be restarted as soon as he is cleared to do so by his gastroenterologist.   I will route this recommendation to the requesting party via Epic fax function and remove from pre-op pool.  Please call with questions.  Abigail Butts, PA-C 04/07/2020, 8:48 AM

## 2020-04-20 ENCOUNTER — Encounter: Payer: Self-pay | Admitting: Student

## 2020-04-20 ENCOUNTER — Other Ambulatory Visit: Payer: Self-pay

## 2020-04-20 ENCOUNTER — Ambulatory Visit (INDEPENDENT_AMBULATORY_CARE_PROVIDER_SITE_OTHER): Payer: Medicare Other | Admitting: Student

## 2020-04-20 VITALS — BP 122/64 | HR 60 | Ht 65.0 in | Wt 150.6 lb

## 2020-04-20 DIAGNOSIS — E039 Hypothyroidism, unspecified: Secondary | ICD-10-CM | POA: Diagnosis not present

## 2020-04-20 DIAGNOSIS — Z5181 Encounter for therapeutic drug level monitoring: Secondary | ICD-10-CM

## 2020-04-20 DIAGNOSIS — Z9861 Coronary angioplasty status: Secondary | ICD-10-CM

## 2020-04-20 DIAGNOSIS — Z79899 Other long term (current) drug therapy: Secondary | ICD-10-CM

## 2020-04-20 DIAGNOSIS — I2510 Atherosclerotic heart disease of native coronary artery without angina pectoris: Secondary | ICD-10-CM

## 2020-04-20 DIAGNOSIS — Z95810 Presence of automatic (implantable) cardiac defibrillator: Secondary | ICD-10-CM | POA: Diagnosis not present

## 2020-04-20 DIAGNOSIS — I70219 Atherosclerosis of native arteries of extremities with intermittent claudication, unspecified extremity: Secondary | ICD-10-CM

## 2020-04-20 DIAGNOSIS — I428 Other cardiomyopathies: Secondary | ICD-10-CM

## 2020-04-20 LAB — COMPREHENSIVE METABOLIC PANEL
ALT: 14 IU/L (ref 0–44)
AST: 24 IU/L (ref 0–40)
Albumin/Globulin Ratio: 2 (ref 1.2–2.2)
Albumin: 4.4 g/dL (ref 3.6–4.6)
Alkaline Phosphatase: 91 IU/L (ref 44–121)
BUN/Creatinine Ratio: 20 (ref 10–24)
BUN: 34 mg/dL — ABNORMAL HIGH (ref 8–27)
Bilirubin Total: 0.2 mg/dL (ref 0.0–1.2)
CO2: 22 mmol/L (ref 20–29)
Calcium: 10.1 mg/dL (ref 8.6–10.2)
Chloride: 102 mmol/L (ref 96–106)
Creatinine, Ser: 1.74 mg/dL — ABNORMAL HIGH (ref 0.76–1.27)
GFR calc Af Amer: 41 mL/min/{1.73_m2} — ABNORMAL LOW (ref >59)
GFR calc non Af Amer: 35 mL/min/{1.73_m2} — ABNORMAL LOW (ref >59)
Globulin, Total: 2.2 g/dL (ref 1.5–4.5)
Glucose: 102 mg/dL — ABNORMAL HIGH (ref 65–99)
Potassium: 5.5 mmol/L — ABNORMAL HIGH (ref 3.5–5.2)
Sodium: 134 mmol/L (ref 134–144)
Total Protein: 6.6 g/dL (ref 6.0–8.5)

## 2020-04-20 LAB — CUP PACEART INCLINIC DEVICE CHECK
Battery Remaining Longevity: 55 mo
Brady Statistic RA Percent Paced: 89 %
Brady Statistic RV Percent Paced: 9.6 %
Date Time Interrogation Session: 20211110102957
HighPow Impedance: 64.125
Implantable Lead Implant Date: 20170601
Implantable Lead Implant Date: 20170601
Implantable Lead Location: 753859
Implantable Lead Location: 753860
Implantable Lead Model: 7122
Implantable Pulse Generator Implant Date: 20171221
Lead Channel Impedance Value: 400 Ohm
Lead Channel Impedance Value: 450 Ohm
Lead Channel Pacing Threshold Amplitude: 0.625 V
Lead Channel Pacing Threshold Amplitude: 0.75 V
Lead Channel Pacing Threshold Pulse Width: 0.5 ms
Lead Channel Pacing Threshold Pulse Width: 0.5 ms
Lead Channel Sensing Intrinsic Amplitude: 2.6 mV
Lead Channel Sensing Intrinsic Amplitude: 3.3 mV
Lead Channel Setting Pacing Amplitude: 1 V
Lead Channel Setting Pacing Amplitude: 1.625
Lead Channel Setting Pacing Pulse Width: 0.5 ms
Lead Channel Setting Sensing Sensitivity: 0.5 mV
Pulse Gen Serial Number: 7390376

## 2020-04-20 LAB — TSH: TSH: 4.8 u[IU]/mL — ABNORMAL HIGH (ref 0.450–4.500)

## 2020-04-20 NOTE — Patient Instructions (Signed)
Medication Instructions:  *If you need a refill on your cardiac medications before your next appointment, please call your pharmacy*  Lab Work: Your physician has recommended that you have lab work today: CMET and TSH  If you have labs (blood work) drawn today and your tests are completely normal, you will receive your results only by: [name] MyChart Message (if you have MyChart) OR . A paper copy in the mail If you have any lab test that is abnormal or we need to change your treatment, we will call you to review the results.  Follow-Up: At [facility], you and your health needs are our priority.  As part of our continuing mission to provide you with exceptional heart care, we have created designated Provider Care Teams.  These Care Teams include your primary Cardiologist (physician) and Advanced Practice Providers (APPs -  Physician Assistants and Nurse Practitioners) who all work together to provide you with the care you need, when you need it.  We recommend signing up for the patient portal called "MyChart".  Sign up information is provided on this After Visit Summary.  MyChart is used to connect with patients for Virtual Visits (Telemedicine).  Patients are able to view lab/test results, encounter notes, upcoming appointments, etc.  Non-urgent messages can be sent to your provider as well.   To learn more about what you can do with MyChart, go to [email or web address].    Your next appointment:   Your physician wants you to follow-up in: 82 MONTHS with Dr. [name] or an EP APP. You will receive a reminder letter in the mail two months in advance. If you don't receive a letter, please call our office to schedule the follow-up appointment.  The format for your next appointment:   In Person. You may see [name], MD or one of the following Advanced Practice Providers on your designated Care Team:    Chanetta Marshall, NP  Tommye Standard, PA-C  Legrand Como "Starbuck" [place], [place]

## 2020-04-20 NOTE — Progress Notes (Signed)
Electrophysiology Office Note Date: 04/20/2020  ID:  Ralph Drake, Ralph Drake [birth year], MRN [id number]  PCP: [name], MD Primary Cardiologist: Glenetta Hew, MD Electrophysiologist: [name], MD   CC: Routine ICD follow-up  Ralph Drake is a 85 y.o. male seen today for [name], MD for acute visit due to RA lead noise on remote report.  Since last being seen in our clinic the patient reports doing very well.  he denies chest pain, palpitations, dyspnea, PND, orthopnea, nausea, vomiting, dizziness, syncope, edema, weight gain, or early satiety. He has not had ICD shocks.   Device History: St. Jude Dual Chamber ICD implanted 11/2015 for NICM, hemodynamically unstable VT, gen change 05/2016 for "device failure" History of appropriate therapy: No History of AAD therapy: Yes; currently on amiodarone  Past Medical History:  Diagnosis Date  . Bradycardia   . CAD S/P percutaneous coronary angioplasty 1993   POBA to Cx x 2 occasions: 10/2015:Mod (mod-severe CAD): p-mCx 50%. mLAD ~40%, mRCA ~40%. dRCA ~70%, No culprit lesion for VT.  [name] Diverticulosis   . Dyslipidemia, goal LDL below 70   . Former heavy tobacco smoker     quit in October 2014  . GERD (gastroesophageal reflux disease)   . Hyperlipidemia   . Hypertension   . Olecranon bursitis of left elbow   . Osteoarthritis   . Peripheral vascular disease ([place])     H/O R SFA stent, L Iliac Stent 1996;;  Lower Extremity Angiography April 2015: Occluded left SFA reconstituting at end of the canal, heavily calcified. Three-vessel runoff..;; RCA 50% ostial right common iliac, patent SFA stent -> Referred to Dr. Trula Slade of [facility].  . Postoperative atrial fibrillation (HCC)    No recurrence  . S/P AVR (aortic valve replacement) 02/10/2013   [address] Ease Pericardial Tissue Valve  . Severe aortic stenosis by prior echocardiogram    per Dr. Rollene Fare 2012; referred for aortic valve replacement  . Shortness of breath      Chronic  . Trigger ring finger of left hand   . Tubular adenoma of colon   . VT (ventricular tachycardia) ([place])    hemodynamically significant VT on 11/07/2015, nonobstructive CAD, no culprit lesion, underwent St Jude Dual Chamber ICD by Dr. [name]   Past Surgical History:  Procedure Laterality Date  . ABDOMINAL ANGIOGRAM  09/10/2013   Procedure: ABDOMINAL ANGIOGRAM;  Surgeon: [name], MD;  Location: [facility] CATH LAB;  Service: Cardiovascular;;  . ABDOMINAL AORTOGRAM N/A 03/23/2019   Procedure: ABDOMINAL AORTOGRAM;  Surgeon: [name], MD;  Location: [place] CV LAB;  Service: Cardiovascular;  Laterality: N/A;  . AORTIC VALVE REPLACEMENT N/A 02/10/2013   AORTIC VALVE REPLACEMENT (AVR);  Surgeon: Grace Isaac, MD;  Location[facility];  Service: Open Heart Surgery:  Dr. Servando Snare: [address] Ease Pericardia  . ARTERIAL DOPPLER - PRE-CABG  02/06/2013   Bilateral 1-39% ICA stenosis  . CARDIAC CATHETERIZATION  August 2014   Nonobstructive 30-50% RCA. No significant LAD disease. 30% mid circumflex.  [name] CARDIAC CATHETERIZATION  11/10/2009   No intervention - recommend medical management  . CARDIAC CATHETERIZATION N/A 11/09/2015   Procedure: Left Heart Cath and Coronary Angiography;  Surgeon: Belva Crome, MD;  Location: [facility] INVASIVE CV LAB: Mod (mod-severe CAD): p-mCx 50%. mLAD ~40%, mRCA ~40%. dRCA ~70%, No culprit lesion for VT.  [name] CARDIOVASCULAR STRESS TEST  06/24/2012   Diffuse LV hypokinesis with moderately depressed systolic function and EF 91%,  .  CORONARY ANGIOPLASTY  1993   Circumflex x 2   . EP IMPLANTABLE DEVICE N/A 11/10/2015   Procedure: ICD Implant;  Surgeon: Evans Lance, MD;  Location: [place] CV LAB;  Service: Cardiovascular;  Laterality: N/A;  . ICD GENERATOR CHANGEOUT N/A 05/31/2016   Procedure: ICD Generator Changeout;  Surgeon: [place] Grayer, MD;  Location: [place] CV LAB;  Service: Cardiovascular;  Laterality: N/A;  . ILIAC ARTERY STENT Left 1996  .  INTRAOPERATIVE TRANSESOPHAGEAL ECHOCARDIOGRAM N/A 02/10/2013   Procedure: INTRAOPERATIVE TRANSESOPHAGEAL ECHOCARDIOGRAM;  Surgeon: Grace Isaac, MD;  Location: [place];  Service: Open Heart Surgery;  Laterality: N/A;  . LEFT AND RIGHT HEART CATHETERIZATION WITH CORONARY ANGIOGRAM N/A 01/28/2013   Procedure: LEFT AND RIGHT HEART CATHETERIZATION WITH CORONARY ANGIOGRAM;  Surgeon: Leonie Man, MD;  Location: [facility] CATH LAB;  Service: Cardiovascular;  Laterality: N/A;  . LOWER EXTREMITY ANGIOGRAM N/A 09/10/2013   Procedure: LOWER EXTREMITY ANGIOGRAM;  Surgeon: [name], MD;  Location: [facility] CATH LAB;  Service: Cardiovascular;  Laterality: N/A;  . LOWER EXTREMITY ANGIOGRAPHY Right 03/23/2019   Procedure: Lower Extremity Angiography;  Surgeon: [name], MD;  Location: [place] CV LAB;  Service: Cardiovascular;  Laterality: Right;  . OLECRANON BURSECTOMY  07/23/2011   Procedure: OLECRANON BURSA;  Surgeon: Lorn Junes, MD;  Location: [place];  Service: Orthopedics;  Laterality: Left;  excision olecranon bursa left elbow patient had supraclaviular block in preop  . PERIPHERAL VASCULAR ANGIOGRAM  07/19/1994   No intervention-recommend medical therapy  . PERIPHERAL VASCULAR ANGIOGRAM  01/17/1995   Right SFA 80-90# lesion, dilatation performed with a Cordis "Optz 5" 78m x 2cm, inflated a 4-7.5atm for 40 to 70 seconds. Completion angiogram done by hand with "bolus chase" runoff from Primal SFA to foot. [place] in reduction of 80-90% to 20% with excellent flow and without dissection  . PERIPHERAL VASCULAR ANGIOGRAM  09/10/2013   Occluded left SFA, heavily calcified with reconstitution at adductor canal. Three-vessel runof.;; Right lower extremity noted 50% ostial right common iliac stenosis with roughly 20 mm gradient. Not considered significant.f  . PERIPHERAL VASCULAR INTERVENTION Right 03/23/2019   Procedure: PERIPHERAL VASCULAR INTERVENTION;  Surgeon: BLorretta Harp MD;   [facility];  Service: Cardiovascular;  Laterality: Right;  common iliac  . right hand surgery    . ROTATOR CUFF REPAIR Right   . Superficial Femoral Atery stent Right 1996  . TONSILLECTOMY     as child  . TRANSTHORACIC ECHOCARDIOGRAM  03/23/2013   Post-Op Echo #1: EF 50-55%, Mild Conc LVH, Mild HK of Inferolateral wall with paradoxical septal motion (c/w post-op state); Well seated 23 mm Bioprosthetic Valve (area ~1.23 cm2 - normal for valve); Mild MR  . TRANSTHORACIC ECHOCARDIOGRAM  March 2017   EF 50-55%. No RWMA.  GR 1 DD.  23 mm bioprosthetic valve functioning normally.  Mild biatrial dilation.  . TRANSTHORACIC ECHOCARDIOGRAM  08/2017   EF 50 and 55% with moderate LVH.  No regional wall motion normalities.  GR 1 DD.  Bioprosthetic aortic valve present -normal gradients.  .Marland KitchenTRIGGER FINGER RELEASE  07/23/2011   Procedure: RELEASE TRIGGER FINGER/A-1 PULLEY;  Surgeon: RLorn Junes MD;  Location: [place]  Service: Orthopedics;  Laterality: Left;  release trigger left ring finger patient had supraclavicular block in preop    Current Outpatient Medications  Medication Sig Dispense Refill  . acetaminophen (TYLENOL) 500 MG tablet Take 500 mg by mouth every 6 (six)  hours as needed for moderate pain or headache.    [name] amiodarone (PACERONE) 200 MG tablet Take one tablet by mouth daily Monday through Friday.  Do NOT take on Saturday or Sunday. 90 tablet 3  . amLODipine (NORVASC) 10 MG tablet Take 1 tablet (10 mg total) by mouth daily. 90 tablet 3  . aspirin 81 MG EC tablet Take 1 tablet (81 mg total) by mouth daily.    [name] atorvastatin (LIPITOR) 80 MG tablet Take 1 tablet (80 mg total) by mouth daily at 6 PM. 90 tablet 3  . cilostazol (PLETAL) 100 MG tablet Take 1 tablet (100 mg total) by mouth 2 (two) times daily. 180 tablet 3  . clopidogrel (PLAVIX) 75 MG tablet Take 1 tablet (75 mg total) by mouth daily. 90 tablet 3  . ezetimibe (ZETIA) 10 MG tablet Take 1  tablet (10 mg total) by mouth daily. 90 tablet 3  . fish oil-omega-3 fatty acids 1000 MG capsule Take 1 g by mouth daily.     [name] levothyroxine (SYNTHROID, LEVOTHROID) 50 MCG tablet Take 50 mcg by mouth daily before breakfast.    . lisinopril (ZESTRIL) 10 MG tablet TAKE 1 TABLET BY MOUTH  DAILY 90 tablet 3  . loratadine (CLARITIN) 10 MG tablet Take 10 mg by mouth daily as needed for allergies.    . Multiple Vitamins-Minerals (MULTIVITAMIN WITH MINERALS) tablet Take 1 tablet by mouth daily.    . vitamin E 400 UNIT capsule Take 2 capsules by mouth daily.     No current facility-administered medications for this visit.    Allergies:   Patient has no known allergies.   Social History: Social History   Socioeconomic History  . Marital status: Married    Spouse name: Not on file  . Number of children: Not on file  . Years of education: Not on file  . Highest education level: Not on file  Occupational History  . Not on file  Tobacco Use  . Smoking status: Former Smoker    Packs/day: 0.50    Years: 60.00    Pack years: 30.00    Types: Cigarettes    Quit date: 01/31/2013    Years since quitting: 7.2  . Smokeless tobacco: Never Used  Vaping Use  . Vaping Use: Never used  Substance and Sexual Activity  . Alcohol use: No    Alcohol/week: 0.0 standard drinks  . Drug use: No  . Sexual activity: Not on file  Other Topics Concern  . Not on file  Social History Narrative   Married with no children. He long-term smoker who quit prior to his aVR in 2014.    Up until his operation he worked part-time at [employer].  He is not yet gone back to work postoperatively.   He chose not be cardiac rehabilitation, but has gone back to walking exercising daily.   Social Determinants of Health   Financial Resource Strain:   . Difficulty of Paying Living Expenses: Not on file  Food Insecurity:   . Worried About Charity fundraiser in the Last Year: Not on file  . Ran Out of Food in the  Last Year: Not on file  Transportation Needs:   . Lack of Transportation (Medical): Not on file  . Lack of Transportation (Non-Medical): Not on file  Physical Activity:   . Days of Exercise per Week: Not on file  . Minutes of Exercise per Session: Not on file  Stress:   . Feeling of  Stress : Not on file  Social Connections:   . Frequency of Communication with Friends and Family: Not on file  . Frequency of Social Gatherings with Friends and Family: Not on file  . Attends Religious Services: Not on file  . Active Member of Clubs or Organizations: Not on file  . Attends Archivist Meetings: Not on file  . Marital Status: Not on file  Intimate Partner Violence:   . Fear of Current or Ex-Partner: Not on file  . Emotionally Abused: Not on file  . Physically Abused: Not on file  . Sexually Abused: Not on file    Family History: Family History  Problem Relation Age of Onset  . Hypertension Mother   . Cancer Mother        Oral  . Stroke Father   . Colon cancer Neg Hx   . Stomach cancer Neg Hx   . Esophageal cancer Neg Hx     Review of Systems: All other systems reviewed and are otherwise negative except as noted above.   Physical Exam: Vitals:   04/20/20 1003  BP: 122/64  Pulse: 60  SpO2: 96%  Weight: 150 lb 9.6 oz (68.3 kg)  Height: 5' 5" (1.651 m)     GEN- The patient is well appearing, alert and oriented x 3 today.   HEENT: normocephalic, atraumatic; sclera clear, conjunctiva pink; hearing intact; oropharynx clear; neck supple, no JVP Lymph- no cervical lymphadenopathy Lungs- Clear to ausculation bilaterally, normal work of breathing.  No wheezes, rales, rhonchi Heart- Regular rate and rhythm, no murmurs, rubs or gallops, PMI not laterally displaced GI- soft, non-tender, non-distended, bowel sounds present, no hepatosplenomegaly Extremities- no clubbing or cyanosis. No edema; DP/PT/radial pulses 2+ bilaterally MS- no significant deformity or atrophy Skin-  warm and dry, no rash or lesion; ICD pocket well healed Psych- euthymic mood, full affect Neuro- strength and sensation are intact  ICD interrogation- reviewed in detail today,  See PACEART report  EKG:  EKG is ordered today. The ekg ordered today shows NSR at 60 bpm  Recent Labs: 02/24/2020: ALT 18   Wt Readings from Last 3 Encounters:  04/20/20 150 lb 9.6 oz (68.3 kg)  04/06/20 150 lb (68 kg)  02/12/20 150 lb (68 kg)     Other studies Reviewed: Additional studies/ records that were reviewed today include: Previous EP office notes and remotes.   Assessment and Plan:  1.  VT s/p St. Jude dual chamber ICD  euvolemic today Stable on an appropriate medical regimen Normal ICD function See Pace Art report No changes today Echo 08/2017 showed LVEF 50-55% Atrial lead noise not reproducible with isometric movements today. Encouraged to stay off ladders and away from powerlines.   2. CAD Denies s/s of ischemic with yard work. Continue current medications   Current medicines are reviewed at length with the patient today.   The patient does not have concerns regarding his medicines.  The following changes were made today:  none  Labs/ tests ordered today include:  Orders Placed This Encounter  Procedures  . Comp Met (CMET)  . TSH  . CUP PACEART [place]  . EKG 12-Lead    Disposition:   Follow up with Dr. [name] or team  9 months. Sooner with symptoms.  Jacalyn Lefevre, PA-C  04/20/2020 10:36 AM  [facility] [address] [place] [address] [phone] (office) [phone] (fax)

## 2020-04-21 ENCOUNTER — Telehealth: Payer: Self-pay

## 2020-04-21 DIAGNOSIS — Z5181 Encounter for therapeutic drug level monitoring: Secondary | ICD-10-CM

## 2020-04-21 DIAGNOSIS — I2510 Atherosclerotic heart disease of native coronary artery without angina pectoris: Secondary | ICD-10-CM

## 2020-04-21 DIAGNOSIS — I5022 Chronic systolic (congestive) heart failure: Secondary | ICD-10-CM

## 2020-04-21 NOTE — Telephone Encounter (Signed)
Per DPR left detailed message on home answering machine Scheduled repeat BMET for 11/18 Instructed patient to call back if any questions or conflicts with lab

## 2020-04-21 NOTE — Telephone Encounter (Signed)
-----   Message from Shirley Friar, PA-C sent at 04/21/2020 10:55 AM EST ----- K high normal. Please make sure he is not taking potassium supplementation.   Counsel on low K diet. (Limit bananas, tomato (especially sauce or paste) or salt substitutes that are high in potassium such as No-salt and Ms. Dash) Cr slightly elevated from previous, so would likely also benefit from gently increasing his hydration.  Please repeat 7 days for stability.   Thank you!  Legrand Como [address]" [place], PA-C  04/21/2020 10:55 AM

## 2020-04-28 ENCOUNTER — Other Ambulatory Visit: Payer: Self-pay

## 2020-04-28 ENCOUNTER — Other Ambulatory Visit: Payer: Medicare Other | Admitting: *Deleted

## 2020-04-28 DIAGNOSIS — Z5181 Encounter for therapeutic drug level monitoring: Secondary | ICD-10-CM

## 2020-04-28 DIAGNOSIS — I2510 Atherosclerotic heart disease of native coronary artery without angina pectoris: Secondary | ICD-10-CM

## 2020-04-28 DIAGNOSIS — I5022 Chronic systolic (congestive) heart failure: Secondary | ICD-10-CM

## 2020-04-28 DIAGNOSIS — Z9861 Coronary angioplasty status: Secondary | ICD-10-CM

## 2020-04-28 LAB — BASIC METABOLIC PANEL
BUN/Creatinine Ratio: 15 (ref 10–24)
BUN: 23 mg/dL (ref 8–27)
CO2: 21 mmol/L (ref 20–29)
Calcium: 10 mg/dL (ref 8.6–10.2)
Chloride: 100 mmol/L (ref 96–106)
Creatinine, Ser: 1.58 mg/dL — ABNORMAL HIGH (ref 0.76–1.27)
GFR calc Af Amer: 46 mL/min/{1.73_m2} — ABNORMAL LOW (ref >59)
GFR calc non Af Amer: 40 mL/min/{1.73_m2} — ABNORMAL LOW (ref >59)
Glucose: 94 mg/dL (ref 65–99)
Potassium: 5.4 mmol/L — ABNORMAL HIGH (ref 3.5–5.2)
Sodium: 133 mmol/L — ABNORMAL LOW (ref 134–144)

## 2020-05-02 ENCOUNTER — Telehealth: Payer: Self-pay

## 2020-05-02 DIAGNOSIS — Z5181 Encounter for therapeutic drug level monitoring: Secondary | ICD-10-CM

## 2020-05-02 DIAGNOSIS — I10 Essential (primary) hypertension: Secondary | ICD-10-CM

## 2020-05-02 MED ORDER — LISINOPRIL 5 MG PO TABS: 5.0000 mg | ORAL_TABLET | Freq: Every day | ORAL | 3 refills | Status: DC

## 2020-05-02 NOTE — Telephone Encounter (Signed)
Patient called back returning Tay's call

## 2020-05-02 NOTE — Telephone Encounter (Signed)
Pt returned phone call Pt is aware and agreeable lab results and recommendations Pt states he is set up to see nephrology already Pt is agreeable to cutting lisinopril in half and taking 5 mg daily Pt is agreeable to repeat BMET on 05/16/20 Orders In Per patient request last BMET and CMET will be mailed to him for his records

## 2020-05-02 NOTE — Addendum Note (Signed)
Addended by: Campbell Riches on: 05/02/2020 01:49 PM   Modules accepted: Orders

## 2020-05-02 NOTE — Telephone Encounter (Signed)
Lmtcb for lab results 

## 2020-05-02 NOTE — Telephone Encounter (Signed)
-----   Message from Shirley Friar, PA-C sent at 04/29/2020 10:14 AM EST ----- Can't tell if instructions went to you or just posted. Thank you!

## 2020-05-16 ENCOUNTER — Other Ambulatory Visit: Payer: Medicare Other

## 2020-05-23 ENCOUNTER — Ambulatory Visit (AMBULATORY_SURGERY_CENTER): Payer: Medicare Other | Admitting: Internal Medicine

## 2020-05-23 ENCOUNTER — Encounter: Payer: Self-pay | Admitting: Internal Medicine

## 2020-05-23 ENCOUNTER — Other Ambulatory Visit: Payer: Self-pay

## 2020-05-23 VITALS — BP 97/56 | HR 63 | Temp 97.1°F | Resp 17 | Ht 65.0 in | Wt 150.0 lb

## 2020-05-23 DIAGNOSIS — K529 Noninfective gastroenteritis and colitis, unspecified: Secondary | ICD-10-CM

## 2020-05-23 DIAGNOSIS — K52832 Lymphocytic colitis: Secondary | ICD-10-CM

## 2020-05-23 DIAGNOSIS — D120 Benign neoplasm of cecum: Secondary | ICD-10-CM

## 2020-05-23 DIAGNOSIS — D125 Benign neoplasm of sigmoid colon: Secondary | ICD-10-CM

## 2020-05-23 DIAGNOSIS — D124 Benign neoplasm of descending colon: Secondary | ICD-10-CM

## 2020-05-23 MED ORDER — SODIUM CHLORIDE 0.9 % IV SOLN: 500.0000 mL | Freq: Once | INTRAVENOUS | Status: DC

## 2020-05-23 NOTE — Progress Notes (Signed)
pt tolerated well. VSS. awake and to recovery. Report given to RN.  

## 2020-05-23 NOTE — Patient Instructions (Signed)
Please read handouts provided. Continue present medications. Await pathology results. Resume Plavix ( clopidogrel ) at prior dose tomorrow.     YOU HAD AN ENDOSCOPIC PROCEDURE TODAY AT [facility]:   Refer to the procedure report that was given to you for any specific questions about what was found during the examination.  If the procedure report does not answer your questions, please call your gastroenterologist to clarify.  If you requested that your care partner not be given the details of your procedure findings, then the procedure report has been included in a sealed envelope for you to review at your convenience later.  YOU SHOULD EXPECT: Some feelings of bloating in the abdomen. Passage of more gas than usual.  Walking can help get rid of the air that was put into your GI tract during the procedure and reduce the bloating. If you had a lower endoscopy (such as a colonoscopy or flexible sigmoidoscopy) you may notice spotting of blood in your stool or on the toilet paper. If you underwent a bowel prep for your procedure, you may not have a normal bowel movement for a few days.  Please Note:  You might notice some irritation and congestion in your nose or some drainage.  This is from the oxygen used during your procedure.  There is no need for concern and it should clear up in a day or so.  SYMPTOMS TO REPORT IMMEDIATELY:   Following lower endoscopy (colonoscopy or flexible sigmoidoscopy):  Excessive amounts of blood in the stool  Significant tenderness or worsening of abdominal pains  Swelling of the abdomen that is new, acute  Fever of 100F or higher   For urgent or emergent issues, a gastroenterologist can be reached at any hour by calling [phone]. Do not use MyChart messaging for urgent concerns.    DIET:  We do recommend a small meal at first, but then you may proceed to your regular diet.  Drink plenty of fluids but you should avoid alcoholic beverages  for 24 hours.  ACTIVITY:  You should plan to take it easy for the rest of today and you should NOT DRIVE or use heavy machinery until tomorrow (because of the sedation medicines used during the test).    FOLLOW UP: Our staff will call the number listed on your records 48-72 hours following your procedure to check on you and address any questions or concerns that you may have regarding the information given to you following your procedure. If we do not reach you, we will leave a message.  We will attempt to reach you two times.  During this call, we will ask if you have developed any symptoms of COVID 19. If you develop any symptoms (ie: fever, flu-like symptoms, shortness of breath, cough etc.) before then, please call [phone].  If you test positive for Covid 19 in the 2 weeks post procedure, please call and report this information to [place].    If any biopsies were taken you will be contacted by phone or by letter within the next 1-3 weeks.  Please call us at [phone] if you have not heard about the biopsies in 3 weeks.    SIGNATURES/CONFIDENTIALITY: You and/or your care partner have signed paperwork which will be entered into your electronic medical record.  These signatures attest to the fact that that the information above on your After Visit Summary has been reviewed and is understood.  Full responsibility of the confidentiality of this discharge information lies  with you and/or your care-partner.

## 2020-05-23 NOTE — Op Note (Signed)
[place] Patient Name: Ralph Drake Procedure Date: 05/23/2020 2:15 PM MRN: [id number] Endoscopist: Jerene Bears , MD Age: 85 Referring MD:  Date of Birth: 1934-06-19 Gender: Male Account #: [id number] Procedure:                Colonoscopy Indications:              Chronic diarrhea, personal history of multiple                            adenomas and sessile serrated polyps, last                            colonoscopy April 2018 Medicines:                Monitored Anesthesia Care Procedure:                Pre-Anesthesia Assessment:                           - Prior to the procedure, a History and Physical                            was performed, and patient medications and                            allergies were reviewed. The patient's tolerance of                            previous anesthesia was also reviewed. The risks                            and benefits of the procedure and the sedation                            options and risks were discussed with the patient.                            All questions were answered, and informed consent                            was obtained. Prior Anticoagulants: The patient has                            taken no previous anticoagulant or antiplatelet                            agents. ASA Grade Assessment: III - A patient with                            severe systemic disease. After reviewing the risks                            and benefits, the patient was deemed in  satisfactory condition to undergo the procedure.                           After obtaining informed consent, the colonoscope                            was passed under direct vision. Throughout the                            procedure, the patient's blood pressure, pulse, and                            oxygen saturations were monitored continuously. The                            adult colonoscope was introduced through the  anus                            and advanced to the cecum, identified by                            appendiceal orifice and ileocecal valve. The                            colonoscopy was performed without difficulty. The                            patient tolerated the procedure well. The quality                            of the bowel preparation was good. The ileocecal                            valve, appendiceal orifice, and rectum were                            photographed. Scope In: 2:29:49 PM Scope Out: 2:49:22 PM Scope Withdrawal Time: 0 hours 16 minutes 1 second  Total Procedure Duration: 0 hours 19 minutes 33 seconds  Findings:                 The digital rectal exam was normal.                           Three sessile polyps were found in the cecum. The                            polyps were 3 to 5 mm in size. These polyps were                            removed with a cold snare. Resection and retrieval                            were complete.  A 3 mm polyp was found in the descending colon. The                            polyp was sessile. The polyp was removed with a                            cold biopsy forceps. Resection and retrieval were                            complete.                           A 5 mm polyp was found in the sigmoid colon. The                            polyp was sessile. The polyp was removed with a                            cold snare. Resection and retrieval were complete.                           A few small-mouthed diverticula were found in the                            sigmoid colon.                           Internal hemorrhoids were found during                            retroflexion. The hemorrhoids were small.                           Biopsies for histology were taken with a cold                            forceps from the right colon and left colon for                            evaluation of microscopic  colitis. Complications:            No immediate complications. Estimated Blood Loss:     Estimated blood loss was minimal. Impression:               - Three 3 to 5 mm polyps in the cecum, removed with                            a cold snare. Resected and retrieved.                           - One 3 mm polyp in the descending colon, removed                            with a cold biopsy forceps. Resected  and retrieved.                           - One 5 mm polyp in the sigmoid colon, removed with                            a cold snare. Resected and retrieved.                           - Diverticulosis in the sigmoid colon.                           - Small internal hemorrhoids.                           - Biopsies were taken with a cold forceps from the                            right colon and left colon for evaluation of                            microscopic colitis. Recommendation:           - Patient has a contact number available for                            emergencies. The signs and symptoms of potential                            delayed complications were discussed with the                            patient. Return to normal activities tomorrow.                            Written discharge instructions were provided to the                            patient.                           - Resume previous diet.                           - Continue present medications.                           - Await pathology results.                           - No recommendation at this time regarding repeat                            colonoscopy due to age.                           - Resume Plavix (clopidogrel)  at prior dose                            tomorrow. Refer to managing physician for further                            adjustment of therapy. Jerene Bears, MD 05/23/2020 2:55:26 PM This report has been signed electronically.

## 2020-05-23 NOTE — Progress Notes (Signed)
Called to room to assist during endoscopic procedure.  Patient ID and intended procedure confirmed with present staff. Received instructions for my participation in the procedure from the performing physician.  

## 2020-05-23 NOTE — Progress Notes (Signed)
VS taken by JD

## 2020-05-24 ENCOUNTER — Telehealth: Payer: Self-pay | Admitting: Cardiology

## 2020-05-24 NOTE — Telephone Encounter (Signed)
Spoke to patient's wife . She states she will informed patient  and he will hold medication to see if diarrhea gets better.

## 2020-05-24 NOTE — Telephone Encounter (Signed)
    Pt c/o medication issue:  1. Name of Medication:   cilostazol (PLETAL) 100 MG tablet    2. How are you currently taking this medication (dosage and times per day)? Take 1 tablet (100 mg total) by mouth 2 (two) times daily.  3. Are you having a reaction (difficulty breathing--STAT)?   4. What is your medication issue? Pt would like to discuss this medication with Dr. Allison Quarry nurse

## 2020-05-24 NOTE — Telephone Encounter (Signed)
I do not think I was 1 actually put him on Pletal.  I am fine if he wants to hold it and see if that makes any difference with his leg pain.  The Pletal is for PAD.  If his claudication worsens, then he has to decide which symptom is worse.  Glenetta Hew, MD

## 2020-05-24 NOTE — Telephone Encounter (Signed)
Returned call to wife (ok per DPR)  She states patient has had ongoing chronic diarrhea for 1-2 years.  She states patient went to PCP and was referred back to Dr. Hilarie Fredrickson (GI doc)  Patient had colonoscopy yesterday, Dr. Hilarie Fredrickson has him hold Plavix and Pletal x 7 days prior.   She states when he was holding those two medications he did not have diarrhea.   She states she looked at the side effects of Pletal and discussed with Dr. Hilarie Fredrickson and they were concerned the Pletal could be causing the diarrhea.   Dr. Hilarie Fredrickson suggested he discuss with Dr. [name].   He had colonoscopy yesterday, 5 benign polyps but no cause of diarrhea found.    She is wondering if patient could continue to hold pletal or if Dr. [name] believes this could be causing the diarrhea.     Advised would route to MD to review/advise.

## 2020-05-24 NOTE — Telephone Encounter (Signed)
Pletal can cause diarrhea in up to 19% of the patients, but the final decision to HOLD has to be addressed by MD.

## 2020-05-25 ENCOUNTER — Telehealth: Payer: Self-pay | Admitting: *Deleted

## 2020-05-25 NOTE — Telephone Encounter (Signed)
°  Follow up Call-  Call back number 05/23/2020  Post procedure Call Back phone  # [phone]  Permission to leave phone message Yes  Some recent data might be hidden     Patient questions:  Do you have a fever, pain , or abdominal swelling? No. Pain Score  0 *  Have you tolerated food without any problems? Yes.    Have you been able to return to your normal activities? Yes.    Do you have any questions about your discharge instructions: Diet   No. Medications  No. Follow up visit  No.  Do you have questions or concerns about your Care? No.  Actions: * If pain score is 4 or above: No action needed, pain <4  1. Have you developed a fever since your procedure? NO  2.   Have you had an respiratory symptoms (SOB or cough) since your procedure? NO  3.   Have you tested positive for COVID 19 since your procedure NO  4.   Have you had any family members/close contacts diagnosed with the COVID 19 since your procedure?  NO   If yes to any of these questions please route to Joylene John, RN and Joella Prince, RN

## 2020-06-04 ENCOUNTER — Other Ambulatory Visit: Payer: Self-pay | Admitting: Cardiology

## 2020-06-04 ENCOUNTER — Other Ambulatory Visit: Payer: Self-pay | Admitting: Cardiovascular Disease

## 2020-06-04 DIAGNOSIS — E785 Hyperlipidemia, unspecified: Secondary | ICD-10-CM

## 2020-06-21 ENCOUNTER — Ambulatory Visit (INDEPENDENT_AMBULATORY_CARE_PROVIDER_SITE_OTHER): Payer: Medicare Other

## 2020-06-21 DIAGNOSIS — I5022 Chronic systolic (congestive) heart failure: Secondary | ICD-10-CM

## 2020-06-21 LAB — CUP PACEART REMOTE DEVICE CHECK
Battery Remaining Longevity: 53 mo
Battery Remaining Percentage: 57 %
Battery Voltage: 2.93 V
Brady Statistic AP VP Percent: 27 %
Brady Statistic AP VS Percent: 56 %
Brady Statistic AS VP Percent: 1 %
Brady Statistic AS VS Percent: 16 %
Brady Statistic RA Percent Paced: 83 %
Brady Statistic RV Percent Paced: 28 %
Date Time Interrogation Session: 20220111040540
HighPow Impedance: 69 Ohm
HighPow Impedance: 69 Ohm
Implantable Lead Implant Date: 20170601
Implantable Lead Implant Date: 20170601
Implantable Lead Location: 753859
Implantable Lead Location: 753860
Implantable Lead Model: 7122
Implantable Pulse Generator Implant Date: 20171221
Lead Channel Impedance Value: 430 Ohm
Lead Channel Impedance Value: 480 Ohm
Lead Channel Pacing Threshold Amplitude: 0.625 V
Lead Channel Pacing Threshold Amplitude: 1 V
Lead Channel Pacing Threshold Pulse Width: 0.5 ms
Lead Channel Pacing Threshold Pulse Width: 0.5 ms
Lead Channel Sensing Intrinsic Amplitude: 3.4 mV
Lead Channel Sensing Intrinsic Amplitude: 3.6 mV
Lead Channel Setting Pacing Amplitude: 1.25 V
Lead Channel Setting Pacing Amplitude: 1.625
Lead Channel Setting Pacing Pulse Width: 0.5 ms
Lead Channel Setting Sensing Sensitivity: 0.5 mV
Pulse Gen Serial Number: 7390376

## 2020-07-06 ENCOUNTER — Telehealth: Payer: Self-pay | Admitting: Cardiology

## 2020-07-06 DIAGNOSIS — Z79899 Other long term (current) drug therapy: Secondary | ICD-10-CM

## 2020-07-06 NOTE — Telephone Encounter (Signed)
I will be honest, I am not familiar with this medicine.  I think it is relatively similar to Pletal, but the tradename Trental is no longer available in the [place]. Amenable for this tomorrow pharmacists for their advice.  Glenetta Hew, MD

## 2020-07-06 NOTE — Telephone Encounter (Signed)
Haven't seen this used in 15 years or so.  When it was the only thing around we did use regularly.  Dose is 400 mg tid, although can decrease to bid if causes GI distress.  If you use, check BMET and CBC after 2-3 weeks.  I've attached the [place] listed in UpToDate below.    Although the precise mechanism of action is not well-defined, blood viscosity is lowered, erythrocyte flexibility is increased, leukocyte deformability is increased, and neutrophil adhesion and activation are decreased. Overall, tissue oxygenation is significantly increased.

## 2020-07-06 NOTE — Telephone Encounter (Signed)
Patient's wife, Apolonio Schneiders, calling on behalf of patient and requested to speak with Ivin Booty, Dr. Allison Quarry nurse. She has questions about a medication that Dr. Kary Kos suggested her husband try for his legs bothering him. Per Apolonio Schneiders the medication is Trental. She states [facility] is supposed to be sending [place] something in regards to it. Please advise   Thank you!

## 2020-07-06 NOTE — Telephone Encounter (Signed)
Returned the call to the patient's wife per the dpr. She stated that the patient was having diarrhea on the Pletal. Dr. Kary Kos had suggested the patient try Trental.   The wife would like to know if Dr. [name] would approve the switch. If so, she would like it sent to [facility].

## 2020-07-07 NOTE — Progress Notes (Signed)
Remote ICD transmission.   

## 2020-07-08 MED ORDER — PENTOXIFYLLINE ER 400 MG PO TBCR: 400.0000 mg | EXTENDED_RELEASE_TABLET | Freq: Two times a day (BID) | ORAL | 1 refills | Status: DC

## 2020-07-08 NOTE — Telephone Encounter (Signed)
So it sounds like this is an old medication that may potentially help with Peripheral Artery Disease --> It requires close monitoring of Blood Counts & chemistries.  With his main side effect being GI - would start with  400 mg BID.  My preference would be to see how he does with his claudication off of Pletal for a while to see if he really has been benefiting from it.  Only if symptoms are notably worse off medication, would I try this option.  Glenetta Hew, MD

## 2020-07-08 NOTE — Telephone Encounter (Signed)
Spoke with wife and relayed pharmacist and Dr. Allison Quarry recommendations. Wife is adamant they try Trental. States patient is not doing well with his legs and that they are really bothering him. Trental 400mg  BID ordered. Lab orders placed and mailed to patient to be completed in 2-3 weeks.

## 2020-07-08 NOTE — Telephone Encounter (Signed)
Patient's wife calling back to follow up.

## 2020-07-14 ENCOUNTER — Telehealth: Payer: Self-pay

## 2020-07-14 NOTE — Telephone Encounter (Signed)
Pt wife left a message asking if we received the patient transmission. I LMOVM to let her know we did receive the transmission and she should be receiving the letter soon.

## 2020-07-29 ENCOUNTER — Telehealth: Payer: Self-pay

## 2020-07-29 NOTE — Telephone Encounter (Signed)
[facility] alert received 07/30/19 for increased amount of V pacing 36%. Patient reports he is been active, felt fine and no recent illnesses. Advised I will forward to Dr. [name] for review and we will call with any changes. Verbalized understanding.

## 2020-07-29 NOTE — Telephone Encounter (Signed)
No change. GT

## 2020-08-06 ENCOUNTER — Other Ambulatory Visit: Payer: Self-pay | Admitting: Cardiovascular Disease

## 2020-08-17 LAB — CBC
Hematocrit: 40.5 % (ref 37.5–51.0)
Hemoglobin: 13.9 g/dL (ref 13.0–17.7)
MCH: 30.8 pg (ref 26.6–33.0)
MCHC: 34.3 g/dL (ref 31.5–35.7)
MCV: 90 fL (ref 79–97)
Platelets: 198 10*3/uL (ref 150–450)
RBC: 4.52 x10E6/uL (ref 4.14–5.80)
RDW: 13.9 % (ref 11.6–15.4)
WBC: 5.8 10*3/uL (ref 3.4–10.8)

## 2020-08-17 LAB — BASIC METABOLIC PANEL
BUN/Creatinine Ratio: 18 (ref 10–24)
BUN: 29 mg/dL — ABNORMAL HIGH (ref 8–27)
CO2: 18 mmol/L — ABNORMAL LOW (ref 20–29)
Calcium: 10.1 mg/dL (ref 8.6–10.2)
Chloride: 98 mmol/L (ref 96–106)
Creatinine, Ser: 1.63 mg/dL — ABNORMAL HIGH (ref 0.76–1.27)
Glucose: 84 mg/dL (ref 65–99)
Potassium: 5.3 mmol/L — ABNORMAL HIGH (ref 3.5–5.2)
Sodium: 130 mmol/L — ABNORMAL LOW (ref 134–144)
eGFR: 42 mL/min/{1.73_m2} — ABNORMAL LOW (ref >59)

## 2020-08-21 ENCOUNTER — Other Ambulatory Visit: Payer: Self-pay | Admitting: Internal Medicine

## 2020-08-22 ENCOUNTER — Telehealth: Payer: Self-pay | Admitting: *Deleted

## 2020-08-22 NOTE — Telephone Encounter (Signed)
The patient has been notified of the result and verbalized understanding.  All questions (if any) were answered. Raiford Simmonds, RN 08/22/2020 6:32 PM   sent copy to kidney Dr Candiss Norse patient aware to hold lisinopril for 2 days then restart medication

## 2020-08-22 NOTE — Telephone Encounter (Signed)
-----   Message from Leonie Man, MD sent at 08/19/2020  9:20 PM EST ----- Lab results: Chemistry is essentially stable.  Kidney function is stable with moderate dysfunction.  No real change from last several years.  Potassium is a little on the high side -> would hold lisinopril for 2 days to allow the potassium level to come back down again.  We may need to change blood pressure medications if this continues to be an issue.  Blood cell counts look good.  Normal white blood cell count and hemoglobin levels.  Platelets are stable.  Glenetta Hew, MD

## 2020-08-23 NOTE — Telephone Encounter (Signed)
Copy routed  to primary as well . Patient states he only has one kidney .

## 2020-09-06 ENCOUNTER — Other Ambulatory Visit: Payer: Self-pay | Admitting: Nephrology

## 2020-09-14 ENCOUNTER — Other Ambulatory Visit: Payer: Self-pay | Admitting: Cardiovascular Disease

## 2020-09-20 ENCOUNTER — Ambulatory Visit (INDEPENDENT_AMBULATORY_CARE_PROVIDER_SITE_OTHER): Payer: Medicare Other

## 2020-09-20 DIAGNOSIS — I428 Other cardiomyopathies: Secondary | ICD-10-CM

## 2020-09-20 LAB — CUP PACEART REMOTE DEVICE CHECK
Battery Remaining Longevity: 49 mo
Battery Remaining Percentage: 54 %
Battery Voltage: 2.93 V
Brady Statistic AP VP Percent: 44 %
Brady Statistic AP VS Percent: 45 %
Brady Statistic AS VP Percent: 1 %
Brady Statistic AS VS Percent: 9.9 %
Brady Statistic RA Percent Paced: 89 %
Brady Statistic RV Percent Paced: 45 %
Date Time Interrogation Session: 20220412040021
HighPow Impedance: 69 Ohm
HighPow Impedance: 69 Ohm
Implantable Lead Implant Date: 20170601
Implantable Lead Implant Date: 20170601
Implantable Lead Location: 753859
Implantable Lead Location: 753860
Implantable Lead Model: 7122
Implantable Pulse Generator Implant Date: 20171221
Lead Channel Impedance Value: 390 Ohm
Lead Channel Impedance Value: 440 Ohm
Lead Channel Pacing Threshold Amplitude: 0.5 V
Lead Channel Pacing Threshold Amplitude: 0.875 V
Lead Channel Pacing Threshold Pulse Width: 0.5 ms
Lead Channel Pacing Threshold Pulse Width: 0.5 ms
Lead Channel Sensing Intrinsic Amplitude: 2.5 mV
Lead Channel Sensing Intrinsic Amplitude: 4.1 mV
Lead Channel Setting Pacing Amplitude: 1.125
Lead Channel Setting Pacing Amplitude: 1.5 V
Lead Channel Setting Pacing Pulse Width: 0.5 ms
Lead Channel Setting Sensing Sensitivity: 0.5 mV
Pulse Gen Serial Number: 7390376

## 2020-10-05 NOTE — Progress Notes (Signed)
Remote ICD transmission.   

## 2020-10-06 ENCOUNTER — Other Ambulatory Visit: Payer: Self-pay | Admitting: Cardiovascular Disease

## 2020-10-06 ENCOUNTER — Telehealth: Payer: Self-pay | Admitting: Student

## 2020-10-06 ENCOUNTER — Ambulatory Visit (HOSPITAL_COMMUNITY)
Admission: RE | Admit: 2020-10-06 | Discharge: 2020-10-06 | Disposition: A | Discharge status: Home / Self Care | Payer: Medicare Other | Source: Ambulatory Visit | Attending: Cardiology | Admitting: Cardiology

## 2020-10-06 ENCOUNTER — Ambulatory Visit (HOSPITAL_BASED_OUTPATIENT_CLINIC_OR_DEPARTMENT_OTHER)
Admission: RE | Admit: 2020-10-06 | Discharge: 2020-10-06 | Disposition: A | Discharge status: Home / Self Care | Payer: Medicare Other | Source: Ambulatory Visit | Attending: Cardiovascular Disease | Admitting: Cardiovascular Disease

## 2020-10-06 ENCOUNTER — Other Ambulatory Visit: Payer: Self-pay

## 2020-10-06 DIAGNOSIS — Z95828 Presence of other vascular implants and grafts: Secondary | ICD-10-CM | POA: Insufficient documentation

## 2020-10-06 DIAGNOSIS — I739 Peripheral vascular disease, unspecified: Secondary | ICD-10-CM | POA: Insufficient documentation

## 2020-10-06 NOTE — Telephone Encounter (Signed)
Pt c/o medication issue:  1. Name of Medication: lisinopril (ZESTRIL) 5 MG tablet(Expired)  2. How are you currently taking this medication (dosage and times per day)? Not currently taking   3. Are you having a reaction (difficulty breathing--STAT)? No   4. What is your medication issue? PT is calling with concerns about his medication.PT states he takes 10 mg a day and is currently all ou tof the medication.He also states that the pharmacy that refills his prescription is telling him to call in and request another script be sent.Please advise

## 2020-10-06 NOTE — Telephone Encounter (Signed)
This is a Dr. [name] and Oda Kilts PA-C pt. Lisinopril was held by Dr. Allison Quarry office on 3/14 telephone encounter for 2 days, based on recent lab results.  Will forward this message to our [facility] office to have them follow-up with the pt, being Dr. [name] has been advising on this medication recently.

## 2020-10-06 NOTE — Telephone Encounter (Signed)
Spoke with of Dr. [name] (and wife)  Explained that lisinopril 5mg  is currently active prescription  Explained that A. Tillery PA decreased dose from 10 >> 5mg  in 04/2020 based on some lab work   Patient's wife said patient never decreased dose - has been taking lisinopril 10mg  all along  Explained that kidney function is the reason why dose was decreased and also why Dr. [name] advised he HOLD lisinopril for 2 days back in March   Wife states that patient had a good visit with nephrologist and renal function was OK - do not have records  Last BP: 09/06/20 - 158/70 -- does not check BP at home  Patient is out of medication - will send to MD and RN as the request for lisinopril refill is for different dose than what is on med list

## 2020-10-06 NOTE — Telephone Encounter (Signed)
Raiford Simmonds, RN  08/22/2020 6:34 PM EDT Back to Top     The patient has been notified of the result and verbalized understanding. All questions (if any) were answered. Raiford Simmonds, RN 08/22/2020 6:32 PM  sent copy to kidney Dr Candiss Norse patient aware to hold lisinopril for 2 days then restart medication   Leonie Man, MD  08/19/2020 9:20 PM EST      Lab results: Chemistry is essentially stable. Kidney function is stable with moderate dysfunction. No real change from last several years. Potassium is a little on the high side -> would hold lisinopril for 2 days to allow the potassium level to come back down again. We may need to change blood pressure medications if this continues to be an issue.  Blood cell counts look good. Normal white blood cell count and hemoglobin levels. Platelets are stable.  Glenetta Hew, MD

## 2020-10-12 NOTE — Telephone Encounter (Signed)
It looks like he was on 10 mg of lisinopril when he saw his nephrologist last month.  If that is the case and his nephrologist okay with it, then we can do 10 mg of lisinopril. We can just refill 10 mg tablets.  Glenetta Hew, MD

## 2020-10-13 MED ORDER — LISINOPRIL 10 MG PO TABS: 10.0000 mg | ORAL_TABLET | Freq: Every day | ORAL | 1 refills | Status: DC

## 2020-10-13 NOTE — Telephone Encounter (Signed)
Patient's wife call back to see if the why prescriptions hasnt be place yet. She stated the pharmacy stated they havent received anything. Please advise

## 2020-10-13 NOTE — Telephone Encounter (Signed)
Wife informed and verbalized understanding. Refill sent to [facility]

## 2020-11-15 ENCOUNTER — Other Ambulatory Visit: Payer: Self-pay | Admitting: Cardiology

## 2020-12-20 ENCOUNTER — Ambulatory Visit (INDEPENDENT_AMBULATORY_CARE_PROVIDER_SITE_OTHER): Payer: Medicare Other

## 2020-12-20 DIAGNOSIS — I428 Other cardiomyopathies: Secondary | ICD-10-CM

## 2020-12-20 LAB — CUP PACEART REMOTE DEVICE CHECK
Battery Remaining Longevity: 46 mo
Battery Remaining Percentage: 51 %
Battery Voltage: 2.92 V
Brady Statistic AP VP Percent: 52 %
Brady Statistic AP VS Percent: 40 %
Brady Statistic AS VP Percent: 1 %
Brady Statistic AS VS Percent: 7.6 %
Brady Statistic RA Percent Paced: 91 %
Brady Statistic RV Percent Paced: 52 %
Date Time Interrogation Session: 20220712040016
HighPow Impedance: 71 Ohm
HighPow Impedance: 71 Ohm
Implantable Lead Implant Date: 20170601
Implantable Lead Implant Date: 20170601
Implantable Lead Location: 753859
Implantable Lead Location: 753860
Implantable Lead Model: 7122
Implantable Pulse Generator Implant Date: 20171221
Lead Channel Impedance Value: 430 Ohm
Lead Channel Impedance Value: 460 Ohm
Lead Channel Pacing Threshold Amplitude: 0.5 V
Lead Channel Pacing Threshold Amplitude: 0.875 V
Lead Channel Pacing Threshold Pulse Width: 0.5 ms
Lead Channel Pacing Threshold Pulse Width: 0.5 ms
Lead Channel Sensing Intrinsic Amplitude: 2.4 mV
Lead Channel Sensing Intrinsic Amplitude: 3.1 mV
Lead Channel Setting Pacing Amplitude: 1.125
Lead Channel Setting Pacing Amplitude: 1.5 V
Lead Channel Setting Pacing Pulse Width: 0.5 ms
Lead Channel Setting Sensing Sensitivity: 0.5 mV
Pulse Gen Serial Number: 7390376

## 2021-01-12 NOTE — Progress Notes (Signed)
Remote ICD transmission.   

## 2021-01-17 ENCOUNTER — Other Ambulatory Visit: Payer: Self-pay | Admitting: Cardiology

## 2021-02-02 ENCOUNTER — Ambulatory Visit (INDEPENDENT_AMBULATORY_CARE_PROVIDER_SITE_OTHER): Payer: Medicare Other | Admitting: Cardiology

## 2021-02-02 ENCOUNTER — Encounter: Payer: Self-pay | Admitting: Cardiology

## 2021-02-02 ENCOUNTER — Other Ambulatory Visit: Payer: Self-pay

## 2021-02-02 VITALS — BP 138/66 | HR 72 | Ht 65.0 in | Wt 141.6 lb

## 2021-02-02 DIAGNOSIS — E785 Hyperlipidemia, unspecified: Secondary | ICD-10-CM

## 2021-02-02 DIAGNOSIS — I2510 Atherosclerotic heart disease of native coronary artery without angina pectoris: Secondary | ICD-10-CM

## 2021-02-02 DIAGNOSIS — I9789 Other postprocedural complications and disorders of the circulatory system, not elsewhere classified: Secondary | ICD-10-CM

## 2021-02-02 DIAGNOSIS — I472 Ventricular tachycardia, unspecified: Secondary | ICD-10-CM

## 2021-02-02 DIAGNOSIS — Z952 Presence of prosthetic heart valve: Secondary | ICD-10-CM

## 2021-02-02 DIAGNOSIS — I10 Essential (primary) hypertension: Secondary | ICD-10-CM

## 2021-02-02 DIAGNOSIS — I739 Peripheral vascular disease, unspecified: Secondary | ICD-10-CM

## 2021-02-02 DIAGNOSIS — Z9861 Coronary angioplasty status: Secondary | ICD-10-CM

## 2021-02-02 DIAGNOSIS — Z79899 Other long term (current) drug therapy: Secondary | ICD-10-CM

## 2021-02-02 DIAGNOSIS — I4891 Unspecified atrial fibrillation: Secondary | ICD-10-CM

## 2021-02-02 NOTE — Patient Instructions (Signed)
Medication Instructions:  NO CHANGES *If you need a refill on your cardiac medications before your next appointment, please call your pharmacy*   Lab Work: TSH LIPID- FASTING CMP SED RATE  CRP  If you have labs (blood work) drawn today and your tests are completely normal, you will receive your results only by: [place] (if you have MyChart) OR A paper copy in the mail If you have any lab test that is abnormal or we need to change your treatment, we will call you to review the results.   Testing/Procedures: NOT NEEDED   Follow-Up: At [facility], you and your health needs are our priority.  As part of our continuing mission to provide you with exceptional heart care, we have created designated Provider Care Teams.  These Care Teams include your primary Cardiologist (physician) and Advanced Practice Providers (APPs -  Physician Assistants and Nurse Practitioners) who all work together to provide you with the care you need, when you need it.  We recommend signing up for the patient portal called "MyChart".  Sign up information is provided on this After Visit Summary.  MyChart is used to connect with patients for Virtual Visits (Telemedicine).  Patients are able to view lab/test results, encounter notes, upcoming appointments, etc.  Non-urgent messages can be sent to your provider as well.   To learn more about what you can do with MyChart, go to [email or web address].    Your next appointment:   12 month(s)  The format for your next appointment:   In Person  Provider:   Glenetta Hew, MD

## 2021-02-02 NOTE — Progress Notes (Signed)
Primary Care Provider: [name], MD Cardiologist: Glenetta Hew, MD Electrophysiologist: [name], MD; Merlyn Lot, PA-C Vascular Cardiology: Dr. Almira Bar Surgeon: Dr. Trula Slade  Clinic Note: Chief Complaint  Patient presents with   Coronary Artery Disease    No angina - now walking ~ 1 mile /day in 500 yard increments   PAD    R leg claudication - walks 500 yarda & stops   Hyperlipidemia   Follow-up    Annual   ===================================  ASSESSMENT/PLAN   Problem List Items Addressed This Visit       Cardiology Problems   CAD S/P PTCA of CFX X 2 in '93 (Chronic)    Distant history of angioplasty back in the 90s.  Patent stents in 2017.  No active angina.  Plan: He is on aspirin and Plavix more for PAD as well as CAD. Okay to hold both aspirin and Plavix 5-7 days preop for surgery or procedures. On high-dose high intensity statin plus Zetia.  Tolerating slightly increased lipids because of lack of desire to take additional meds. On ACE inhibitor and amlodipine for blood pressure and antianginal effect. Not on beta-blocker because of amiodarone.  Could potentially consider if necessary for additional blood pressure control.       Relevant Orders   EKG 12-Lead (Completed)   Lipid panel (Completed)   Comprehensive metabolic panel (Completed)   C-reactive protein (Completed)   Sedimentation rate (Completed)   TSH (Completed)   Essential hypertension (Chronic)    BP elevated today.  But he says at home it is better controlled on this.  We will hold off on adjusting for now.  He is on 10 mg amlodipine and 10 mg lisinopril.  Lisinopril was reduced because of concerns about renal function.  Will defer to nephrology, but I would be okay increasing back to 20 mg if necessary.      Relevant Orders   EKG 12-Lead (Completed)   Dyslipidemia, goal LDL below 70 (Chronic)    On high-dose statin along with Zetia and omega-3 fatty acids. Check labs  now. LDL was 102 on last check.  Reluctant to be overly aggressive.  He did not really want take any additional medications. I think he would benefit from PCSK9 inhibitor, but he was not anxious in the past.       Relevant Orders   Lipid panel (Completed)   Comprehensive metabolic panel (Completed)   Postoperative atrial fibrillation (HCC)    No breakthrough episodes.  Remaining in sinus rhythm on amiodarone.  With no recurrence, would avoid anticoagulation.      Sustained ventricular tachycardia (HCC) - Primary (Chronic)    Status post ICD in 2017.  Followed by Dr. [name].  Seems to be functioning well. Maintaining sinus rhythm on amiodarone.  Hence no beta-blocker.  Would potentially consider restarting beta-blocker for additional blood pressure control if necessary.      Relevant Orders   EKG 12-Lead (Completed)   Comprehensive metabolic panel (Completed)   C-reactive protein (Completed)   Sedimentation rate (Completed)   TSH (Completed)     Other   S/P tissue AVR 02/11/13 (Chronic)    Plan to check 2D echo at next visit.      Relevant Orders   EKG 12-Lead (Completed)   Lipid panel (Completed)   Comprehensive metabolic panel (Completed)   C-reactive protein (Completed)   Sedimentation rate (Completed)   TSH (Completed)   Moderate claudication (HCC) (Chronic)    Doing better now on  Trental.  He is walking routinely.  This seems to be helping.  Not limiting.  Followed by Dr. Gwenlyn Found and Dr. Trula Slade.      Long term current use of amiodarone (Chronic)    Encouraged him to get annual eye exam.  We will check TSH CMP, sed rate and CRP for monitoring for toxicity.      Relevant Orders   EKG 12-Lead (Completed)   Comprehensive metabolic panel (Completed)   C-reactive protein (Completed)   Sedimentation rate (Completed)   TSH (Completed)    ===================================  HPI:    Ralph Drake is a 86 y.o. male with a PMH CAD (moderate-severe), PAD (limiting  claudication) along with Cardiomyopathy (combined ischemic/valvular, s/p ICD for sustained VT) with history of AVR with postop A. fib, presented for annual follow-up   CAD (coronary angioplasty x 2 1993) -> Cath May 2017: Mod (mod-severe CAD): p-mCx 50%. mLAD ~40%, mRCA ~40%. dRCA ~70%, No culprit lesion for VT. PAD (SFA stent 1996, SFA occlusion 2015) ,  September 04, 2017 ABIs: (Right 0.74, left 0.70) right ABIs decreased compared to 2014 but left is stable. --Indicates moderate bilateral lower extremity arterial disease. -->  Duplex: Hard plaque throughout. ->  Abdominal aortic duplex was recommended. (Abdominal duplex was inadequate) H/o AS: s/p AVR ([facility] Ease Bovine Pericardial Tissue Valve 2014) with Cardiomyopathy (mixed Valvular & Ischemic).  2D Echo August 22, 2017: EF 50 and 55% with moderate LVH.  No regional wall motion normalities.  GR 1 DD.  Bioprosthetic aortic valve present -normal gradients. Recurrent VT -- s/p ICD - multiple shocks -- > ICD Implant 11/10/2015 (Dr. [name]) - St. Jude ICD L Ax. V.; readmitted for frequent VT - new ICD  re-implant for device failure 05/2016. (Dr. [name]) Postop atrial fib -on amiodarone.  Not on Pacolet. -Was only noted postop He has known solitary kidney - CKD III along with HTN, HLD.   Ralph Drake was last seen on February 12, 2020 -> doing well from cardiac standpoint.  Activity limited by claudication.  Able to do yard work and grocery shopping, but not exercise.  Has to stop because of calf claudication (right greater than left).  Not interested in further surgical procedures.  Less active because of his wife's ongoing illness.  More tired doing routine activities. -> Continue amiodarone, no beta-blocker.  On high-dose statin and Zetia did not tolerate Pletal.No changes made.  Seen by Oda Kilts, PA from EP in November.  Doing well with no recurrent ICD shocks.  There was some concern about atrial lead "noise ".  But was not reproducible.   Recommend that he avoid ladders and power lines.  In the interim, his ACE inhibitor dose was reduced due to concerns for worsening renal function.  Recent Hospitalizations: None  Reviewed  CV studies:    The following studies were reviewed today: (if available, images/films reviewed: From Epic Chart or Care Everywhere) ABI 09/2020: Slightly decreased right ABIs and decreased left ABI.  Right TBI also reduced. ABI/TBI Today's ABI Today's TBI Previous ABI Previous TBI Right   0.71  0.28   0.76   0.48 Left   0.86  0.45   0.72   0.48   Interval History:   ZAYDENN BALAGUER returns today for routine follow-up indicated that he is feeling much better.  For the last 5 months he has been trying to walk up to a mile a day.  This is in 500 m intervals, having to stop  intermittently for right leg claudication.  He says that the claudication is notably improved since switching from Pletal to Trental.  He was little bit surprised his blood pressure today, stating that at home this morning was 138/64 mmHg which is usually in the average blood pressure.  He denies any dizziness or lightheadedness wooziness.  No headaches or blurred vision.  CV Review of Symptoms (Summary) Cardiovascular ROS: no chest pain or dyspnea on exertion positive for - Able to walk roughly 500 m without stopping, but then able to go right back to walking again.  Because of deconditioning has a little bit of exertional dyspnea but nothing significant. negative for - edema, irregular heartbeat, orthopnea, palpitations, paroxysmal nocturnal dyspnea, rapid heart rate, shortness of breath, or lightheadedness or dizziness, syncope/near syncope or TIA/amaurosis fugax, claudication  REVIEWED OF SYSTEMS   Review of Systems  Constitutional:  Negative for chills, fever, malaise/fatigue and weight loss.  HENT:  Negative for congestion and nosebleeds.   Eyes: Negative.   Respiratory: Negative.    Cardiovascular:  Positive for claudication  (Stable to improved right leg claudication.  Better with addition of Trental.).  Gastrointestinal:  Negative for abdominal pain, blood in stool, constipation, diarrhea and melena.  Genitourinary:  Negative for hematuria.  Musculoskeletal:  Positive for joint pain (Mild aches and pains). Negative for myalgias.  Neurological:  Negative for dizziness, focal weakness, weakness and headaches.  Endo/Heme/Allergies:  Bruises/bleeds easily (No change from baseline).  Psychiatric/Behavioral: Negative.     I have reviewed and (if needed) personally updated the patient's problem list, medications, allergies, past medical and surgical history, social and family history.   PAST MEDICAL HISTORY   Past Medical History:  Diagnosis Date   Bradycardia    CAD S/P percutaneous coronary angioplasty 1993   POBA to Cx x 2 occasions: 10/2015:Mod (mod-severe CAD): p-mCx 50%. mLAD ~40%, mRCA ~40%. dRCA ~70%, No culprit lesion for VT.   Diverticulosis    Dyslipidemia, goal LDL below 70    Former heavy tobacco smoker     quit in October 2014   GERD (gastroesophageal reflux disease)    Hyperlipidemia    Hypertension    Olecranon bursitis of left elbow    Osteoarthritis    Peripheral vascular disease (HCC)     H/O R SFA stent, L Iliac Stent 1996;;  Lower Extremity Angiography April 2015: Occluded left SFA reconstituting at end of the canal, heavily calcified. Three-vessel runoff..;; RCA 50% ostial right common iliac, patent SFA stent -> Referred to Dr. Trula Slade of [facility].   Postoperative atrial fibrillation (HCC)    No recurrence   S/P AVR (aortic valve replacement) 02/10/2013   23 [employer] Ease Pericardial Tissue Valve   Severe aortic stenosis by prior echocardiogram    per Dr. Rollene Fare 2012; referred for aortic valve replacement   Shortness of breath    Chronic   Trigger ring finger of left hand    Tubular adenoma of colon    VT (ventricular tachycardia) ([place])    hemodynamically significant VT on  11/07/2015, nonobstructive CAD, no culprit lesion, underwent St Jude Dual Chamber ICD by Dr. [name]    PAST SURGICAL HISTORY   Past Surgical History:  Procedure Laterality Date   ABDOMINAL ANGIOGRAM  09/10/2013   Procedure: ABDOMINAL ANGIOGRAM;  Surgeon: [name], MD;  Location: [facility] CATH LAB;  Service: Cardiovascular;;   ABDOMINAL AORTOGRAM N/A 03/23/2019   Procedure: ABDOMINAL AORTOGRAM;  Surgeon: [name], MD;  Location: [place] CV LAB;  Service: Cardiovascular;  Laterality: N/A;   AORTIC VALVE REPLACEMENT N/A 02/10/2013   AORTIC VALVE REPLACEMENT (AVR);  Surgeon: Grace Isaac, MD;  Location: [facility] OR;  Service: Open Heart Surgery:  Dr. Servando Snare: [address] Ease Pericardia   ARTERIAL DOPPLER - PRE-CABG  02/06/2013   Bilateral 1-39% ICA stenosis   CARDIAC CATHETERIZATION  August 2014   Nonobstructive 30-50% RCA. No significant LAD disease. 30% mid circumflex.   CARDIAC CATHETERIZATION  11/10/2009   No intervention - recommend medical management   CARDIAC CATHETERIZATION N/A 11/09/2015   Procedure: Left Heart Cath and Coronary Angiography;  Surgeon: Belva Crome, MD;  Location: [facility] INVASIVE CV LAB: Mod (mod-severe CAD): p-mCx 50%. mLAD ~40%, mRCA ~40%. dRCA ~70%, No culprit lesion for VT.   CARDIOVASCULAR STRESS TEST  06/24/2012   Diffuse LV hypokinesis with moderately depressed systolic function and EF 76%,   CORONARY ANGIOPLASTY  1993   Circumflex x 2    EP IMPLANTABLE DEVICE N/A 11/10/2015   Procedure: ICD Implant;  Surgeon: Evans Lance, MD;  Location: [place] CV LAB;  Service: Cardiovascular;  Laterality: N/A;   ICD GENERATOR CHANGEOUT N/A 05/31/2016   Procedure: ICD Generator Changeout;  Surgeon: Thompson Grayer, MD;  Location: [place] CV LAB;  Service: Cardiovascular;  Laterality: N/A;   ILIAC ARTERY STENT Left 1996   INTRAOPERATIVE TRANSESOPHAGEAL ECHOCARDIOGRAM N/A 02/10/2013   Procedure: INTRAOPERATIVE TRANSESOPHAGEAL ECHOCARDIOGRAM;  Surgeon: Grace Isaac, MD;  Location: [place];  Service: Open Heart Surgery;  Laterality: N/A;   LEFT AND RIGHT HEART CATHETERIZATION WITH CORONARY ANGIOGRAM N/A 01/28/2013   Procedure: LEFT AND RIGHT HEART CATHETERIZATION WITH CORONARY ANGIOGRAM;  Surgeon: Leonie Man, MD;  Location: [facility] CATH LAB;  Service: Cardiovascular;  Laterality: N/A;   LOWER EXTREMITY ANGIOGRAM N/A 09/10/2013   Procedure: LOWER EXTREMITY ANGIOGRAM;  Surgeon: [name], MD;  Location: [facility] CATH LAB;  Service: Cardiovascular;  Laterality: N/A;   LOWER EXTREMITY ANGIOGRAPHY Right 03/23/2019   Procedure: Lower Extremity Angiography;  Surgeon: [name], MD;  Location: [place] CV LAB;  Service: Cardiovascular;  Laterality: Right;   OLECRANON BURSECTOMY  07/23/2011   Procedure: OLECRANON BURSA;  Surgeon: Lorn Junes, MD;  Location: [place];  Service: Orthopedics;  Laterality: Left;  excision olecranon bursa left elbow patient had supraclaviular block in preop   PERIPHERAL VASCULAR ANGIOGRAM  07/19/1994   No intervention-recommend medical therapy   PERIPHERAL VASCULAR ANGIOGRAM  01/17/1995   Right SFA 80-90# lesion, dilatation performed with a Cordis "Optz 5" 29mm x 2cm, inflated a 4-7.5atm for 40 to 70 seconds. Completion angiogram done by hand with "bolus chase" runoff from Primal SFA to foot. [place] in reduction of 80-90% to 20% with excellent flow and without dissection   PERIPHERAL VASCULAR ANGIOGRAM  09/10/2013   Occluded left SFA, heavily calcified with reconstitution at adductor canal. Three-vessel runof.;; Right lower extremity noted 50% ostial right common iliac stenosis with roughly 20 mm gradient. Not considered significant.f   PERIPHERAL VASCULAR INTERVENTION Right 03/23/2019   Procedure: PERIPHERAL VASCULAR INTERVENTION;  Surgeon: [name], MD;  Location: [place] CV LAB;  Service: Cardiovascular;  Laterality: Right;  common iliac   right hand surgery     ROTATOR CUFF REPAIR Right     Superficial Femoral Atery stent Right 1996   TONSILLECTOMY     as child   TRANSTHORACIC ECHOCARDIOGRAM  03/23/2013   Post-Op Echo #1: EF 50-55%, Mild Conc LVH, Mild HK of Inferolateral  wall with paradoxical septal motion (c/w post-op state); Well seated 23 mm Bioprosthetic Valve (area ~1.23 cm2 - normal for valve); Mild MR   TRANSTHORACIC ECHOCARDIOGRAM  March 2017   EF 50-55%. No RWMA.  GR 1 DD.  23 mm bioprosthetic valve functioning normally.  Mild biatrial dilation.   TRANSTHORACIC ECHOCARDIOGRAM  08/2017   EF 50 and 55% with moderate LVH.  No regional wall motion normalities.  GR 1 DD.  Bioprosthetic aortic valve present -normal gradients.   TRIGGER FINGER RELEASE  07/23/2011   Procedure: RELEASE TRIGGER FINGER/A-1 PULLEY;  Surgeon: Lorn Junes, MD;  Location: [place];  Service: Orthopedics;  Laterality: Left;  release trigger left ring finger patient had supraclavicular block in preop    Immunization History  Administered Date(s) Administered   Influenza Inj Mdck Quad Pf 03/05/2016   Influenza Split 04/11/2010   PFIZER(Purple Top)SARS-COV-2 Vaccination 06/24/2019, 07/15/2019   Pneumococcal Polysaccharide-23 03/07/2017    MEDICATIONS/ALLERGIES   Current Meds  Medication Sig   acetaminophen (TYLENOL) 500 MG tablet Take 500 mg by mouth every 6 (six) hours as needed for moderate pain or headache.   amiodarone (PACERONE) 200 MG tablet TAKE 1 TABLET BY MOUTH  DAILY MONDAY THROUGH FRIDAY DO NOT TAKE ON SATURDAY OR  SUNDAY   amLODipine (NORVASC) 10 MG tablet TAKE 1 TABLET BY MOUTH  DAILY   aspirin 81 MG EC tablet Take 1 tablet (81 mg total) by mouth daily.   atorvastatin (LIPITOR) 80 MG tablet TAKE 1 TABLET BY MOUTH  DAILY AT 6 PM   clopidogrel (PLAVIX) 75 MG tablet TAKE 1 TABLET BY MOUTH  DAILY   ezetimibe (ZETIA) 10 MG tablet TAKE 1 TABLET BY MOUTH  DAILY   fish oil-omega-3 fatty acids 1000 MG capsule Take 1 g by mouth daily.    levothyroxine (SYNTHROID,  LEVOTHROID) 50 MCG tablet Take 50 mcg by mouth daily before breakfast.   loratadine (CLARITIN) 10 MG tablet Take 10 mg by mouth daily as needed for allergies.   Multiple Vitamins-Minerals (MULTIVITAMIN WITH MINERALS) tablet Take 1 tablet by mouth daily.   pentoxifylline (TRENTAL) 400 MG CR tablet TAKE 1 TABLET BY MOUTH  TWICE DAILY WITH MEALS   vitamin E 400 UNIT capsule Take 2 capsules by mouth daily.    No Known Allergies  SOCIAL HISTORY/FAMILY HISTORY   Reviewed in Epic:  Pertinent findings:  Social History   Tobacco Use   Smoking status: Former    Packs/day: 0.50    Years: 60.00    Pack years: 30.00    Types: Cigarettes    Quit date: 01/31/2013    Years since quitting: 8.0   Smokeless tobacco: Never  Vaping Use   Vaping Use: Never used  Substance Use Topics   Alcohol use: No    Alcohol/week: 0.0 standard drinks   Drug use: No   Social History   Social History Narrative   Married with no children. He long-term smoker who quit prior to his aVR in 2014.    Up until his operation he worked part-time at [employer].  He is not yet gone back to work postoperatively.   He chose not be cardiac rehabilitation, but has gone back to walking exercising daily.    OBJCTIVE -PE, EKG, labs   Wt Readings from Last 3 Encounters:  02/02/21 141 lb 9.6 oz (64.2 kg)  05/23/20 150 lb (68 kg)  04/20/20 150 lb 9.6 oz (68.3 kg)    Physical Exam: BP 138/66  Pulse 72   Ht 5\' 5"  (1.651 m)   Wt 141 lb 9.6 oz (64.2 kg)   SpO2 96%   BMI 23.56 kg/m  Physical Exam Constitutional:      General: He is not in acute distress.    Appearance: Normal appearance. He is normal weight. He is not ill-appearing (Healthy appearing for age.  Well-groomed.  Well-nourished.) or toxic-appearing.  HENT:     Head: Normocephalic and atraumatic.  Neck:     Vascular: Carotid bruit (R>L) present.  Cardiovascular:     Rate and Rhythm: Normal rate and regular rhythm. No extrasystoles are present.     Chest Wall: PMI is not displaced.     Pulses: Decreased pulses.     Heart sounds: S1 normal. Heart sounds are distant. Murmur (Soft 1/6 SEM at RUSB-neck) heard.    No friction rub. No gallop.     Comments: Split S2 Pulmonary:     Effort: Pulmonary effort is normal. No respiratory distress.     Breath sounds: Normal breath sounds. No wheezing, rhonchi or rales.  Musculoskeletal:        General: No swelling.     Cervical back: Normal range of motion and neck supple.  Skin:    General: Skin is warm and dry.  Neurological:     General: No focal deficit present.     Mental Status: He is alert and oriented to person, place, and time.  Psychiatric:        Mood and Affect: Mood normal.        Behavior: Behavior normal.        Thought Content: Thought content normal.        Judgment: Judgment normal.     Adult ECG Report  Rate: 72;  Rhythm: Atrial paced-prolonged AV conduction difficult to tell if it is AV pacing versus a paced V sensed with RBBB and LAFB.  LVH with repull changes.;   Narrative Interpretation: Stable  Recent Labs: Due for lipid check. Most recent lipids showed TC 183, TG 96, HDL 61 LDL 102. Lab Results  Component Value Date   CREATININE 1.63 (H) 08/17/2020   BUN 29 (H) 08/17/2020   NA 130 (L) 08/17/2020   K 5.3 (H) 08/17/2020   CL 98 08/17/2020   CO2 18 (L) 08/17/2020    CBC Latest Ref Rng & Units 08/17/2020 03/24/2019 03/19/2019  WBC 3.4 - 10.8 x10E3/uL 5.8 7.2 5.8  Hemoglobin 13.0 - 17.7 g/dL 13.9 12.6(L) 13.4  Hematocrit 37.5 - 51.0 % 40.5 37.2(L) 39.9  Platelets 150 - 450 x10E3/uL 198 201 220    Lab Results  Component Value Date   HGBA1C 6.3 (H) 11/30/2015   Lab Results  Component Value Date   TSH 4.800 (H) 04/20/2020   ==================================================  COVID-19 Education: The signs and symptoms of COVID-19 were discussed with the patient and how to seek care for testing (follow up with PCP or arrange E-visit).    I spent a total  of 22 minutes with the patient spent in direct patient consultation.  Additional time spent with chart review  / charting (studies, outside notes, etc): 15 min Total Time: 37 min  Current medicines are reviewed at length with the patient today.  (+/- concerns) none  This visit occurred during the SARS-CoV-2 public health emergency.  Safety protocols were in place, including screening questions prior to the visit, additional usage of staff PPE, and extensive cleaning of exam room while observing appropriate contact time as indicated for  disinfecting solutions.  Notice: This dictation was prepared with Dragon dictation along with smaller phrase technology. Any transcriptional errors that result from this process are unintentional and may not be corrected upon review.  Patient Instructions / Medication Changes & Studies & Tests Ordered   Patient Instructions  Medication Instructions:  NO CHANGES *If you need a refill on your cardiac medications before your next appointment, please call your pharmacy*   Lab Work: TSH LIPID- FASTING CMP SED RATE  CRP  If you have labs (blood work) drawn today and your tests are completely normal, you will receive your results only by: MyChart Message (if you have MyChart) OR A paper copy in the mail If you have any lab test that is abnormal or we need to change your treatment, we will call you to review the results.   Testing/Procedures: NOT NEEDED   Follow-Up: At [facility], you and your health needs are our priority.  As part of our continuing mission to provide you with exceptional heart care, we have created designated Provider Care Teams.  These Care Teams include your primary Cardiologist (physician) and Advanced Practice Providers (APPs -  Physician Assistants and Nurse Practitioners) who all work together to provide you with the care you need, when you need it.  We recommend signing up for the patient portal called "MyChart".  Sign up  information is provided on this After Visit Summary.  MyChart is used to connect with patients for Virtual Visits (Telemedicine).  Patients are able to view lab/test results, encounter notes, upcoming appointments, etc.  Non-urgent messages can be sent to your provider as well.   To learn more about what you can do with MyChart, go to [email or web address].    Your next appointment:   12 month(s)  The format for your next appointment:   In Person  Provider:   Glenetta Hew, MD       Studies Ordered:   Orders Placed This Encounter  Procedures   Lipid panel   Comprehensive metabolic panel   C-reactive protein   Sedimentation rate   TSH   EKG 12-Lead      Glenetta Hew, M.D., M.S. Interventional Cardiologist   Pager # [phone] Phone # [phone] [address] [place], [place] [address]   Thank you for choosing Heartcare at [facility]!!

## 2021-02-07 LAB — COMPREHENSIVE METABOLIC PANEL
ALT: 17 IU/L (ref 0–44)
AST: 21 IU/L (ref 0–40)
Albumin/Globulin Ratio: 2.4 — ABNORMAL HIGH (ref 1.2–2.2)
Albumin: 4.8 g/dL — ABNORMAL HIGH (ref 3.6–4.6)
Alkaline Phosphatase: 91 IU/L (ref 44–121)
BUN/Creatinine Ratio: 18 (ref 10–24)
BUN: 32 mg/dL — ABNORMAL HIGH (ref 8–27)
Bilirubin Total: 0.3 mg/dL (ref 0.0–1.2)
CO2: 22 mmol/L (ref 20–29)
Calcium: 11 mg/dL — ABNORMAL HIGH (ref 8.6–10.2)
Chloride: 100 mmol/L (ref 96–106)
Creatinine, Ser: 1.74 mg/dL — ABNORMAL HIGH (ref 0.76–1.27)
Globulin, Total: 2 g/dL (ref 1.5–4.5)
Glucose: 93 mg/dL (ref 65–99)
Potassium: 5.6 mmol/L — ABNORMAL HIGH (ref 3.5–5.2)
Sodium: 137 mmol/L (ref 134–144)
Total Protein: 6.8 g/dL (ref 6.0–8.5)
eGFR: 38 mL/min/{1.73_m2} — ABNORMAL LOW (ref >59)

## 2021-02-07 LAB — LIPID PANEL
Chol/HDL Ratio: 2.9 ratio (ref 0.0–5.0)
Cholesterol, Total: 172 mg/dL (ref 100–199)
HDL: 60 mg/dL (ref >39)
LDL Chol Calc (NIH): 96 mg/dL (ref 0–99)
Triglycerides: 89 mg/dL (ref 0–149)
VLDL Cholesterol Cal: 16 mg/dL (ref 5–40)

## 2021-02-07 LAB — C-REACTIVE PROTEIN: CRP: 2 mg/L (ref 0–10)

## 2021-02-07 LAB — SEDIMENTATION RATE: Sed Rate: 2 mm/hr (ref 0–30)

## 2021-02-07 LAB — TSH: TSH: 4.93 u[IU]/mL — ABNORMAL HIGH (ref 0.450–4.500)

## 2021-02-13 ENCOUNTER — Encounter: Payer: Self-pay | Admitting: Cardiology

## 2021-02-13 NOTE — Assessment & Plan Note (Signed)
BP elevated today.  But he says at home it is better controlled on this.  We will hold off on adjusting for now.  He is on 10 mg amlodipine and 10 mg lisinopril.  Lisinopril was reduced because of concerns about renal function.  Will defer to nephrology, but I would be okay increasing back to 20 mg if necessary.

## 2021-02-13 NOTE — Assessment & Plan Note (Signed)
Distant history of angioplasty back in the 90s.  Patent stents in 2017.  No active angina.  Plan:  He is on aspirin and Plavix more for PAD as well as CAD.  Okay to hold both aspirin and Plavix 5-7 days preop for surgery or procedures.  On high-dose high intensity statin plus Zetia.  Tolerating slightly increased lipids because of lack of desire to take additional meds.  On ACE inhibitor and amlodipine for blood pressure and antianginal effect.  Not on beta-blocker because of amiodarone.  Could potentially consider if necessary for additional blood pressure control.

## 2021-02-13 NOTE — Assessment & Plan Note (Signed)
Doing better now on Trental.  He is walking routinely.  This seems to be helping.  Not limiting.  Followed by Dr. Gwenlyn Found and Dr. Trula Slade.

## 2021-02-13 NOTE — Assessment & Plan Note (Signed)
On high-dose statin along with Zetia and omega-3 fatty acids. Check labs now. LDL was 102 on last check.  Reluctant to be overly aggressive.  He did not really want take any additional medications. I think he would benefit from PCSK9 inhibitor, but he was not anxious in the past.

## 2021-02-13 NOTE — Assessment & Plan Note (Signed)
Status post ICD in 2017.  Followed by Dr. [name].  Seems to be functioning well. Maintaining sinus rhythm on amiodarone.  Hence no beta-blocker.  Would potentially consider restarting beta-blocker for additional blood pressure control if necessary.

## 2021-02-13 NOTE — Assessment & Plan Note (Signed)
Plan to check 2D echo at next visit.

## 2021-02-13 NOTE — Assessment & Plan Note (Signed)
No breakthrough episodes.  Remaining in sinus rhythm on amiodarone.  With no recurrence, would avoid anticoagulation.

## 2021-02-13 NOTE — Assessment & Plan Note (Signed)
Encouraged him to get annual eye exam.  We will check TSH CMP, sed rate and CRP for monitoring for toxicity.

## 2021-02-16 ENCOUNTER — Encounter: Payer: Medicare Other | Admitting: Physician Assistant

## 2021-02-20 ENCOUNTER — Other Ambulatory Visit: Payer: Self-pay | Admitting: Cardiology

## 2021-03-21 ENCOUNTER — Ambulatory Visit (INDEPENDENT_AMBULATORY_CARE_PROVIDER_SITE_OTHER): Payer: Medicare Other

## 2021-03-21 DIAGNOSIS — I428 Other cardiomyopathies: Secondary | ICD-10-CM

## 2021-03-21 LAB — CUP PACEART REMOTE DEVICE CHECK
Battery Remaining Longevity: 43 mo
Battery Remaining Percentage: 49 %
Battery Voltage: 2.92 V
Brady Statistic AP VP Percent: 58 %
Brady Statistic AP VS Percent: 35 %
Brady Statistic AS VP Percent: 1 %
Brady Statistic AS VS Percent: 6 %
Brady Statistic RA Percent Paced: 93 %
Brady Statistic RV Percent Paced: 58 %
Date Time Interrogation Session: 20221011040015
HighPow Impedance: 63 Ohm
HighPow Impedance: 63 Ohm
Implantable Lead Implant Date: 20170601
Implantable Lead Implant Date: 20170601
Implantable Lead Location: 753859
Implantable Lead Location: 753860
Implantable Lead Model: 7122
Implantable Pulse Generator Implant Date: 20171221
Lead Channel Impedance Value: 410 Ohm
Lead Channel Impedance Value: 430 Ohm
Lead Channel Pacing Threshold Amplitude: 0.5 V
Lead Channel Pacing Threshold Amplitude: 0.75 V
Lead Channel Pacing Threshold Pulse Width: 0.5 ms
Lead Channel Pacing Threshold Pulse Width: 0.5 ms
Lead Channel Sensing Intrinsic Amplitude: 3.4 mV
Lead Channel Sensing Intrinsic Amplitude: 3.7 mV
Lead Channel Setting Pacing Amplitude: 1 V
Lead Channel Setting Pacing Amplitude: 1.5 V
Lead Channel Setting Pacing Pulse Width: 0.5 ms
Lead Channel Setting Sensing Sensitivity: 0.5 mV
Pulse Gen Serial Number: 7390376

## 2021-03-27 ENCOUNTER — Other Ambulatory Visit: Payer: Self-pay | Admitting: Cardiology

## 2021-03-27 DIAGNOSIS — E785 Hyperlipidemia, unspecified: Secondary | ICD-10-CM

## 2021-03-28 ENCOUNTER — Encounter: Payer: Medicare Other | Admitting: Internal Medicine

## 2021-03-29 NOTE — Progress Notes (Signed)
Remote ICD transmission.   

## 2021-05-29 ENCOUNTER — Other Ambulatory Visit: Payer: Self-pay

## 2021-05-29 ENCOUNTER — Encounter: Payer: Self-pay | Admitting: Internal Medicine

## 2021-05-29 ENCOUNTER — Ambulatory Visit (INDEPENDENT_AMBULATORY_CARE_PROVIDER_SITE_OTHER): Payer: Medicare Other | Admitting: Internal Medicine

## 2021-05-29 VITALS — BP 124/72 | HR 60 | Ht 65.0 in | Wt 143.4 lb

## 2021-05-29 DIAGNOSIS — I5022 Chronic systolic (congestive) heart failure: Secondary | ICD-10-CM

## 2021-05-29 DIAGNOSIS — Z95810 Presence of automatic (implantable) cardiac defibrillator: Secondary | ICD-10-CM | POA: Diagnosis not present

## 2021-05-29 DIAGNOSIS — I2510 Atherosclerotic heart disease of native coronary artery without angina pectoris: Secondary | ICD-10-CM | POA: Diagnosis not present

## 2021-05-29 DIAGNOSIS — I472 Ventricular tachycardia, unspecified: Secondary | ICD-10-CM | POA: Diagnosis not present

## 2021-05-29 DIAGNOSIS — Z9861 Coronary angioplasty status: Secondary | ICD-10-CM

## 2021-05-29 NOTE — Patient Instructions (Signed)
Medication Instructions:  Your physician recommends that you continue on your current medications as directed. Please refer to the Current Medication list given to you today.  Labwork: None ordered.  Testing/Procedures: None ordered.  Follow-Up: Your physician wants you to follow-up in: one year with [name], MD or one of the following Advanced Practice Providers on your designated Care Team:   Tommye Standard, [place] Legrand Como "Jonni Sanger" Chalmers Cater, [place]  Remote monitoring is used to monitor your ICD from home. This monitoring reduces the number of office visits required to check your device to one time per year. It allows [place] to keep an eye on the functioning of your device to ensure it is working properly. You are scheduled for a device check from home on 06/20/2021. You may send your transmission at any time that day. If you have a wireless device, the transmission will be sent automatically. After your physician reviews your transmission, you will receive a postcard with your next transmission date.  Any Other Special Instructions Will Be Listed Below (If Applicable).  If you need a refill on your cardiac medications before your next appointment, please call your pharmacy.

## 2021-05-29 NOTE — Progress Notes (Signed)
HPI [name] returns today for followup of his ICD and VT. He has an ICM and is s/p AVR. In the interim, he has done well with no chest pain or sob. He has class 2 dyspnea. He denies an ICD therapies. No edema. He is active working outside. No limit. No Known Allergies   Current Outpatient Medications  Medication Sig Dispense Refill   acetaminophen (TYLENOL) 500 MG tablet Take 500 mg by mouth every 6 (six) hours as needed for moderate pain or headache.     amiodarone (PACERONE) 200 MG tablet TAKE 1 TABLET BY MOUTH  DAILY MONDAY THROUGH FRIDAY DO NOT TAKE ON SATURDAY OR  SUNDAY 90 tablet 2   amLODipine (NORVASC) 10 MG tablet TAKE 1 TABLET BY MOUTH  DAILY 90 tablet 3   aspirin 81 MG EC tablet Take 1 tablet (81 mg total) by mouth daily.     atorvastatin (LIPITOR) 80 MG tablet TAKE 1 TABLET BY MOUTH  DAILY AT 6 PM 90 tablet 3   clopidogrel (PLAVIX) 75 MG tablet TAKE 1 TABLET BY MOUTH  DAILY 90 tablet 3   ezetimibe (ZETIA) 10 MG tablet TAKE 1 TABLET BY MOUTH  DAILY 90 tablet 3   fish oil-omega-3 fatty acids 1000 MG capsule Take 1 g by mouth daily.      levothyroxine (SYNTHROID, LEVOTHROID) 50 MCG tablet Take 50 mcg by mouth daily before breakfast.     lisinopril (ZESTRIL) 10 MG tablet TAKE 1 TABLET BY MOUTH  DAILY 90 tablet 3   loratadine (CLARITIN) 10 MG tablet Take 10 mg by mouth daily as needed for allergies.     Multiple Vitamins-Minerals (MULTIVITAMIN WITH MINERALS) tablet Take 1 tablet by mouth daily.     pentoxifylline (TRENTAL) 400 MG CR tablet TAKE 1 TABLET BY MOUTH  TWICE DAILY WITH MEALS 180 tablet 1   vitamin E 400 UNIT capsule Take 2 capsules by mouth daily.     No current facility-administered medications for this visit.     Past Medical History:  Diagnosis Date   Bradycardia    CAD S/P percutaneous coronary angioplasty 1993   POBA to Cx x 2 occasions: 10/2015:Mod (mod-severe CAD): p-mCx 50%. mLAD ~40%, mRCA ~40%. dRCA ~70%, No culprit lesion for VT.    Diverticulosis    Dyslipidemia, goal LDL below 70    Former heavy tobacco smoker     quit in October 2014   GERD (gastroesophageal reflux disease)    Hyperlipidemia    Hypertension    Olecranon bursitis of left elbow    Osteoarthritis    Peripheral vascular disease (HCC)     H/O R SFA stent, L Iliac Stent 1996;;  Lower Extremity Angiography April 2015: Occluded left SFA reconstituting at end of the canal, heavily calcified. Three-vessel runoff..;; RCA 50% ostial right common iliac, patent SFA stent -> Referred to Dr. Trula Slade [facility].   Postoperative atrial fibrillation (HCC)    No recurrence   S/P AVR (aortic valve replacement) 02/10/2013   23 [address] Magna Ease Pericardial Tissue Valve   Severe aortic stenosis by prior echocardiogram    per Dr. Rollene Fare 2012; referred for aortic valve replacement   Shortness of breath    Chronic   Trigger ring finger of left hand    Tubular adenoma of colon    VT (ventricular tachycardia)    hemodynamically significant VT on 11/07/2015, nonobstructive CAD, no culprit lesion, underwent St Jude Dual Chamber ICD by Dr. [name]  ROS:   All systems reviewed and negative except as noted in the HPI.   Past Surgical History:  Procedure Laterality Date   ABDOMINAL ANGIOGRAM  09/10/2013   Procedure: ABDOMINAL ANGIOGRAM;  Surgeon: [name], MD;  Location: [facility] CATH LAB;  Service: Cardiovascular;;   ABDOMINAL AORTOGRAM N/A 03/23/2019   Procedure: ABDOMINAL AORTOGRAM;  Surgeon: [name], MD;  Location: [place] CV LAB;  Service: Cardiovascular;  Laterality: N/A;   AORTIC VALVE REPLACEMENT N/A 02/10/2013   AORTIC VALVE REPLACEMENT (AVR);  Surgeon: Grace Isaac, MD;  Location: [facility] OR;  Service: Open Heart Surgery:  Dr. Servando Snare: [address] Ease Pericardia   ARTERIAL DOPPLER - PRE-CABG  02/06/2013   Bilateral 1-39% ICA stenosis   CARDIAC CATHETERIZATION  August 2014   Nonobstructive 30-50% RCA. No significant LAD disease. 30% mid  circumflex.   CARDIAC CATHETERIZATION  11/10/2009   No intervention - recommend medical management   CARDIAC CATHETERIZATION N/A 11/09/2015   Procedure: Left Heart Cath and Coronary Angiography;  Surgeon: Belva Crome, MD;  Location: [facility] INVASIVE CV LAB: Mod (mod-severe CAD): p-mCx 50%. mLAD ~40%, mRCA ~40%. dRCA ~70%, No culprit lesion for VT.   CARDIOVASCULAR STRESS TEST  06/24/2012   Diffuse LV hypokinesis with moderately depressed systolic function and EF 16%,   CORONARY ANGIOPLASTY  1993   Circumflex x 2    EP IMPLANTABLE DEVICE N/A 11/10/2015   Procedure: ICD Implant;  Surgeon: Evans Lance, MD;  Location: [place] CV LAB;  Service: Cardiovascular;  Laterality: N/A;   ICD GENERATOR CHANGEOUT N/A 05/31/2016   Procedure: ICD Generator Changeout;  Surgeon: Thompson Grayer, MD;  Location: [place] CV LAB;  Service: Cardiovascular;  Laterality: N/A;   ILIAC ARTERY STENT Left 1996   INTRAOPERATIVE TRANSESOPHAGEAL ECHOCARDIOGRAM N/A 02/10/2013   Procedure: INTRAOPERATIVE TRANSESOPHAGEAL ECHOCARDIOGRAM;  Surgeon: Grace Isaac, MD;  Location: [place];  Service: Open Heart Surgery;  Laterality: N/A;   LEFT AND RIGHT HEART CATHETERIZATION WITH CORONARY ANGIOGRAM N/A 01/28/2013   Procedure: LEFT AND RIGHT HEART CATHETERIZATION WITH CORONARY ANGIOGRAM;  Surgeon: Leonie Man, MD;  Location: [facility] CATH LAB;  Service: Cardiovascular;  Laterality: N/A;   LOWER EXTREMITY ANGIOGRAM N/A 09/10/2013   Procedure: LOWER EXTREMITY ANGIOGRAM;  Surgeon: [name], MD;  Location: [facility] CATH LAB;  Service: Cardiovascular;  Laterality: N/A;   LOWER EXTREMITY ANGIOGRAPHY Right 03/23/2019   Procedure: Lower Extremity Angiography;  Surgeon: [name], MD;  Location: [place] CV LAB;  Service: Cardiovascular;  Laterality: Right;   OLECRANON BURSECTOMY  07/23/2011   Procedure: OLECRANON BURSA;  Surgeon: Lorn Junes, MD;  Location: [place];  Service: Orthopedics;  Laterality: Left;   excision olecranon bursa left elbow patient had supraclaviular block in preop   PERIPHERAL VASCULAR ANGIOGRAM  07/19/1994   No intervention-recommend medical therapy   PERIPHERAL VASCULAR ANGIOGRAM  01/17/1995   Right SFA 80-90# lesion, dilatation performed with a Cordis "Optz 5" 63mm x 2cm, inflated a 4-7.5atm for 40 to 70 seconds. Completion angiogram done by hand with "bolus chase" runoff from Primal SFA to foot. [place] in reduction of 80-90% to 20% with excellent flow and without dissection   PERIPHERAL VASCULAR ANGIOGRAM  09/10/2013   Occluded left SFA, heavily calcified with reconstitution at adductor canal. Three-vessel runof.;; Right lower extremity noted 50% ostial right common iliac stenosis with roughly 20 mm gradient. Not considered significant.f   PERIPHERAL VASCULAR INTERVENTION Right 03/23/2019   Procedure: PERIPHERAL VASCULAR INTERVENTION;  Surgeon: [name], MD;  Location: [place] CV LAB;  Service: Cardiovascular;  Laterality: Right;  common iliac   right hand surgery     ROTATOR CUFF REPAIR Right    Superficial Femoral Atery stent Right 1996   TONSILLECTOMY     as child   TRANSTHORACIC ECHOCARDIOGRAM  03/23/2013   Post-Op Echo #1: EF 50-55%, Mild Conc LVH, Mild HK of Inferolateral wall with paradoxical septal motion (c/w post-op state); Well seated 23 mm Bioprosthetic Valve (area ~1.23 cm2 - normal for valve); Mild MR   TRANSTHORACIC ECHOCARDIOGRAM  March 2017   EF 50-55%. No RWMA.  GR 1 DD.  23 mm bioprosthetic valve functioning normally.  Mild biatrial dilation.   TRANSTHORACIC ECHOCARDIOGRAM  08/2017   EF 50 and 55% with moderate LVH.  No regional wall motion normalities.  GR 1 DD.  Bioprosthetic aortic valve present -normal gradients.   TRIGGER FINGER RELEASE  07/23/2011   Procedure: RELEASE TRIGGER FINGER/A-1 PULLEY;  Surgeon: Lorn Junes, MD;  Location: [place];  Service: Orthopedics;  Laterality: Left;  release trigger left ring finger  patient had supraclavicular block in preop     Family History  Problem Relation Age of Onset   Hypertension Mother    Cancer Mother        Oral   Stroke Father    Colon cancer Neg Hx    Stomach cancer Neg Hx    Esophageal cancer Neg Hx    Rectal cancer Neg Hx      Social History   Socioeconomic History   Marital status: Married    Spouse name: Not on file   Number of children: Not on file   Years of education: Not on file   Highest education level: Not on file  Occupational History   Not on file  Tobacco Use   Smoking status: Former    Packs/day: 0.50    Years: 60.00    Pack years: 30.00    Types: Cigarettes    Quit date: 01/31/2013    Years since quitting: 8.3   Smokeless tobacco: Never  Vaping Use   Vaping Use: Never used  Substance and Sexual Activity   Alcohol use: No    Alcohol/week: 0.0 standard drinks   Drug use: No   Sexual activity: Not on file  Other Topics Concern   Not on file  Social History Narrative   Married with no children. He long-term smoker who quit prior to his aVR in 2014.    Up until his operation he worked part-time at [employer].  He is not yet gone back to work postoperatively.   He chose not be cardiac rehabilitation, but has gone back to walking exercising daily.   Social Determinants of Health   Financial Resource Strain: Not on file  Food Insecurity: Not on file  Transportation Needs: Not on file  Physical Activity: Not on file  Stress: Not on file  Social Connections: Not on file  Intimate Partner Violence: Not on file     BP 124/72    Pulse 60    Ht 5\' 5"  (1.651 m)    Wt 143 lb 6.4 oz (65 kg)    SpO2 95%    BMI 23.86 kg/m   Physical Exam:  Well appearing NAD HEENT: Unremarkable Neck:  No JVD, no thyromegally Lymphatics:  No adenopathy Back:  No CVA tenderness Lungs:  Clear HEART:  Regular rate rhythm, no murmurs, no rubs, no clicks  Abd:  soft, positive bowel sounds, no organomegally, no rebound, no  guarding Ext:  2 plus pulses, no edema, no cyanosis, no clubbing Skin:  No rashes no nodules Neuro:  CN II through XII intact, motor grossly intact  EKG - NSR with atrial pacing and RBBB  DEVICE  Normal device function.  See PaceArt for details.   Assess/Plan:  1. VT - he has been well controlled. I have asked him to continue his dose of amiodarone to 200 mg M-F, and none on Sat/Sun. 2. ICD - his St. Jude DDD ICD is working normally. We will recheck in several months 3. CAD - he remains active working in the yard. He is encouraged to increase his activity 4. Chronic systolic heart failure - he has class 2 symptoms. He will continue GDMT and a low sodium diet.  Carleene Overlie Anette Barra,MD

## 2021-06-20 ENCOUNTER — Ambulatory Visit (INDEPENDENT_AMBULATORY_CARE_PROVIDER_SITE_OTHER): Payer: Medicare Other

## 2021-06-20 DIAGNOSIS — I428 Other cardiomyopathies: Secondary | ICD-10-CM

## 2021-06-20 LAB — CUP PACEART REMOTE DEVICE CHECK
Battery Remaining Longevity: 41 mo
Battery Remaining Percentage: 46 %
Battery Voltage: 2.9 V
Brady Statistic AP VP Percent: 66 %
Brady Statistic AP VS Percent: 32 %
Brady Statistic AS VP Percent: 1 %
Brady Statistic AS VS Percent: 1.1 %
Brady Statistic RA Percent Paced: 98 %
Brady Statistic RV Percent Paced: 67 %
Date Time Interrogation Session: 20230110040014
HighPow Impedance: 64 Ohm
HighPow Impedance: 64 Ohm
Implantable Lead Implant Date: 20170601
Implantable Lead Implant Date: 20170601
Implantable Lead Location: 753859
Implantable Lead Location: 753860
Implantable Lead Model: 7122
Implantable Pulse Generator Implant Date: 20171221
Lead Channel Impedance Value: 400 Ohm
Lead Channel Impedance Value: 430 Ohm
Lead Channel Pacing Threshold Amplitude: 0.5 V
Lead Channel Pacing Threshold Amplitude: 0.75 V
Lead Channel Pacing Threshold Pulse Width: 0.5 ms
Lead Channel Pacing Threshold Pulse Width: 0.5 ms
Lead Channel Sensing Intrinsic Amplitude: 3.6 mV
Lead Channel Sensing Intrinsic Amplitude: 4.4 mV
Lead Channel Setting Pacing Amplitude: 1 V
Lead Channel Setting Pacing Amplitude: 1.5 V
Lead Channel Setting Pacing Pulse Width: 0.5 ms
Lead Channel Setting Sensing Sensitivity: 0.5 mV
Pulse Gen Serial Number: 7390376

## 2021-06-29 NOTE — Progress Notes (Signed)
Remote ICD transmission.   

## 2021-07-18 ENCOUNTER — Other Ambulatory Visit: Payer: Self-pay | Admitting: Cardiology

## 2021-07-18 ENCOUNTER — Other Ambulatory Visit: Payer: Self-pay | Admitting: Internal Medicine

## 2021-09-19 ENCOUNTER — Ambulatory Visit (INDEPENDENT_AMBULATORY_CARE_PROVIDER_SITE_OTHER): Payer: Medicare Other

## 2021-09-19 DIAGNOSIS — I428 Other cardiomyopathies: Secondary | ICD-10-CM

## 2021-09-19 LAB — CUP PACEART REMOTE DEVICE CHECK
Battery Remaining Longevity: 38 mo
Battery Remaining Percentage: 43 %
Battery Voltage: 2.9 V
Brady Statistic AP VP Percent: 62 %
Brady Statistic AP VS Percent: 36 %
Brady Statistic AS VP Percent: 1 %
Brady Statistic AS VS Percent: 1.9 %
Brady Statistic RA Percent Paced: 97 %
Brady Statistic RV Percent Paced: 62 %
Date Time Interrogation Session: 20230411040016
HighPow Impedance: 69 Ohm
HighPow Impedance: 69 Ohm
Implantable Lead Implant Date: 20170601
Implantable Lead Implant Date: 20170601
Implantable Lead Location: 753859
Implantable Lead Location: 753860
Implantable Lead Model: 7122
Implantable Pulse Generator Implant Date: 20171221
Lead Channel Impedance Value: 400 Ohm
Lead Channel Impedance Value: 410 Ohm
Lead Channel Pacing Threshold Amplitude: 0.5 V
Lead Channel Pacing Threshold Amplitude: 0.875 V
Lead Channel Pacing Threshold Pulse Width: 0.5 ms
Lead Channel Pacing Threshold Pulse Width: 0.5 ms
Lead Channel Sensing Intrinsic Amplitude: 3.2 mV
Lead Channel Sensing Intrinsic Amplitude: 3.6 mV
Lead Channel Setting Pacing Amplitude: 1.125
Lead Channel Setting Pacing Amplitude: 1.5 V
Lead Channel Setting Pacing Pulse Width: 0.5 ms
Lead Channel Setting Sensing Sensitivity: 0.5 mV
Pulse Gen Serial Number: 7390376

## 2021-10-05 NOTE — Progress Notes (Signed)
Remote ICD transmission.   

## 2021-12-05 ENCOUNTER — Other Ambulatory Visit: Payer: Self-pay | Admitting: Cardiology

## 2021-12-19 ENCOUNTER — Ambulatory Visit (INDEPENDENT_AMBULATORY_CARE_PROVIDER_SITE_OTHER): Payer: Medicare Other

## 2021-12-19 DIAGNOSIS — I428 Other cardiomyopathies: Secondary | ICD-10-CM | POA: Diagnosis not present

## 2021-12-19 LAB — CUP PACEART REMOTE DEVICE CHECK
Battery Remaining Longevity: 35 mo
Battery Remaining Percentage: 40 %
Battery Voltage: 2.9 V
Brady Statistic AP VP Percent: 66 %
Brady Statistic AP VS Percent: 33 %
Brady Statistic AS VP Percent: 1 %
Brady Statistic AS VS Percent: 1.2 %
Brady Statistic RA Percent Paced: 98 %
Brady Statistic RV Percent Paced: 66 %
Date Time Interrogation Session: 20230711040016
HighPow Impedance: 64 Ohm
HighPow Impedance: 64 Ohm
Implantable Lead Implant Date: 20170601
Implantable Lead Implant Date: 20170601
Implantable Lead Location: 753859
Implantable Lead Location: 753860
Implantable Lead Model: 7122
Implantable Pulse Generator Implant Date: 20171221
Lead Channel Impedance Value: 390 Ohm
Lead Channel Impedance Value: 400 Ohm
Lead Channel Pacing Threshold Amplitude: 0.5 V
Lead Channel Pacing Threshold Amplitude: 0.75 V
Lead Channel Pacing Threshold Pulse Width: 0.5 ms
Lead Channel Pacing Threshold Pulse Width: 0.5 ms
Lead Channel Sensing Intrinsic Amplitude: 2.9 mV
Lead Channel Sensing Intrinsic Amplitude: 3 mV
Lead Channel Setting Pacing Amplitude: 1 V
Lead Channel Setting Pacing Amplitude: 1.5 V
Lead Channel Setting Pacing Pulse Width: 0.5 ms
Lead Channel Setting Sensing Sensitivity: 0.5 mV
Pulse Gen Serial Number: 7390376

## 2022-01-10 NOTE — Progress Notes (Signed)
Remote ICD transmission.   

## 2022-02-06 ENCOUNTER — Emergency Department: Payer: Medicare Other

## 2022-02-06 ENCOUNTER — Other Ambulatory Visit: Payer: Self-pay

## 2022-02-06 ENCOUNTER — Inpatient Hospital Stay
Admission: EM | Admit: 2022-02-06 | Discharge: 2022-02-09 | DRG: 180 | Disposition: A | Discharge status: Home / Self Care | Payer: Medicare Other | Attending: Hospitalist | Admitting: Hospitalist

## 2022-02-06 DIAGNOSIS — R0602 Shortness of breath: Secondary | ICD-10-CM | POA: Diagnosis present

## 2022-02-06 DIAGNOSIS — I255 Ischemic cardiomyopathy: Secondary | ICD-10-CM | POA: Diagnosis present

## 2022-02-06 DIAGNOSIS — E222 Syndrome of inappropriate secretion of antidiuretic hormone: Secondary | ICD-10-CM | POA: Diagnosis present

## 2022-02-06 DIAGNOSIS — Z9861 Coronary angioplasty status: Secondary | ICD-10-CM | POA: Diagnosis not present

## 2022-02-06 DIAGNOSIS — J9601 Acute respiratory failure with hypoxia: Secondary | ICD-10-CM | POA: Diagnosis present

## 2022-02-06 DIAGNOSIS — N1832 Chronic kidney disease, stage 3b: Secondary | ICD-10-CM | POA: Diagnosis present

## 2022-02-06 DIAGNOSIS — I2510 Atherosclerotic heart disease of native coronary artery without angina pectoris: Secondary | ICD-10-CM | POA: Diagnosis present

## 2022-02-06 DIAGNOSIS — E871 Hypo-osmolality and hyponatremia: Secondary | ICD-10-CM | POA: Diagnosis not present

## 2022-02-06 DIAGNOSIS — I5032 Chronic diastolic (congestive) heart failure: Secondary | ICD-10-CM | POA: Diagnosis present

## 2022-02-06 DIAGNOSIS — Z9889 Other specified postprocedural states: Secondary | ICD-10-CM

## 2022-02-06 DIAGNOSIS — Z953 Presence of xenogenic heart valve: Secondary | ICD-10-CM

## 2022-02-06 DIAGNOSIS — R918 Other nonspecific abnormal finding of lung field: Secondary | ICD-10-CM

## 2022-02-06 DIAGNOSIS — I130 Hypertensive heart and chronic kidney disease with heart failure and stage 1 through stage 4 chronic kidney disease, or unspecified chronic kidney disease: Secondary | ICD-10-CM | POA: Diagnosis present

## 2022-02-06 DIAGNOSIS — E785 Hyperlipidemia, unspecified: Secondary | ICD-10-CM | POA: Diagnosis present

## 2022-02-06 DIAGNOSIS — Z79890 Hormone replacement therapy (postmenopausal): Secondary | ICD-10-CM

## 2022-02-06 DIAGNOSIS — C771 Secondary and unspecified malignant neoplasm of intrathoracic lymph nodes: Secondary | ICD-10-CM | POA: Diagnosis present

## 2022-02-06 DIAGNOSIS — I739 Peripheral vascular disease, unspecified: Secondary | ICD-10-CM | POA: Diagnosis present

## 2022-02-06 DIAGNOSIS — I4510 Unspecified right bundle-branch block: Secondary | ICD-10-CM | POA: Diagnosis present

## 2022-02-06 DIAGNOSIS — Z20822 Contact with and (suspected) exposure to covid-19: Secondary | ICD-10-CM | POA: Diagnosis present

## 2022-02-06 DIAGNOSIS — Z8249 Family history of ischemic heart disease and other diseases of the circulatory system: Secondary | ICD-10-CM | POA: Diagnosis not present

## 2022-02-06 DIAGNOSIS — Z809 Family history of malignant neoplasm, unspecified: Secondary | ICD-10-CM

## 2022-02-06 DIAGNOSIS — C3412 Malignant neoplasm of upper lobe, left bronchus or lung: Principal | ICD-10-CM | POA: Diagnosis present

## 2022-02-06 DIAGNOSIS — Z823 Family history of stroke: Secondary | ICD-10-CM

## 2022-02-06 DIAGNOSIS — J90 Pleural effusion, not elsewhere classified: Secondary | ICD-10-CM | POA: Diagnosis present

## 2022-02-06 DIAGNOSIS — C7801 Secondary malignant neoplasm of right lung: Secondary | ICD-10-CM | POA: Diagnosis present

## 2022-02-06 DIAGNOSIS — Z7902 Long term (current) use of antithrombotics/antiplatelets: Secondary | ICD-10-CM

## 2022-02-06 DIAGNOSIS — Z87891 Personal history of nicotine dependence: Secondary | ICD-10-CM

## 2022-02-06 DIAGNOSIS — J441 Chronic obstructive pulmonary disease with (acute) exacerbation: Secondary | ICD-10-CM | POA: Diagnosis present

## 2022-02-06 DIAGNOSIS — K219 Gastro-esophageal reflux disease without esophagitis: Secondary | ICD-10-CM | POA: Diagnosis present

## 2022-02-06 DIAGNOSIS — Z79899 Other long term (current) drug therapy: Secondary | ICD-10-CM

## 2022-02-06 DIAGNOSIS — R0902 Hypoxemia: Secondary | ICD-10-CM | POA: Diagnosis present

## 2022-02-06 DIAGNOSIS — K769 Liver disease, unspecified: Secondary | ICD-10-CM | POA: Diagnosis not present

## 2022-02-06 DIAGNOSIS — C787 Secondary malignant neoplasm of liver and intrahepatic bile duct: Secondary | ICD-10-CM | POA: Diagnosis present

## 2022-02-06 DIAGNOSIS — Z95810 Presence of automatic (implantable) cardiac defibrillator: Secondary | ICD-10-CM

## 2022-02-06 DIAGNOSIS — J9621 Acute and chronic respiratory failure with hypoxia: Secondary | ICD-10-CM

## 2022-02-06 DIAGNOSIS — Z7982 Long term (current) use of aspirin: Secondary | ICD-10-CM

## 2022-02-06 LAB — TROPONIN I (HIGH SENSITIVITY)
Troponin I (High Sensitivity): 51 ng/L — ABNORMAL HIGH (ref <18)
Troponin I (High Sensitivity): 57 ng/L — ABNORMAL HIGH (ref <18)

## 2022-02-06 LAB — CBC WITH DIFFERENTIAL/PLATELET
Abs Immature Granulocytes: 0.11 10*3/uL — ABNORMAL HIGH (ref 0.00–0.07)
Basophils Absolute: 0 10*3/uL (ref 0.0–0.1)
Basophils Relative: 0 %
Eosinophils Absolute: 0.1 10*3/uL (ref 0.0–0.5)
Eosinophils Relative: 1 %
HCT: 39.5 % (ref 39.0–52.0)
Hemoglobin: 13.4 g/dL (ref 13.0–17.0)
Immature Granulocytes: 1 %
Lymphocytes Relative: 8 %
Lymphs Abs: 0.8 10*3/uL (ref 0.7–4.0)
MCH: 30.5 pg (ref 26.0–34.0)
MCHC: 33.9 g/dL (ref 30.0–36.0)
MCV: 90 fL (ref 80.0–100.0)
Monocytes Absolute: 0.8 10*3/uL (ref 0.1–1.0)
Monocytes Relative: 8 %
Neutro Abs: 7.8 10*3/uL — ABNORMAL HIGH (ref 1.7–7.7)
Neutrophils Relative %: 82 %
Platelets: 189 10*3/uL (ref 150–400)
RBC: 4.39 MIL/uL (ref 4.22–5.81)
RDW: 13.9 % (ref 11.5–15.5)
WBC: 9.5 10*3/uL (ref 4.0–10.5)
nRBC: 0 % (ref 0.0–0.2)

## 2022-02-06 LAB — SODIUM, URINE, RANDOM: Sodium, Ur: 58 mmol/L

## 2022-02-06 LAB — BASIC METABOLIC PANEL
Anion gap: 5 (ref 5–15)
BUN: 35 mg/dL — ABNORMAL HIGH (ref 8–23)
CO2: 22 mmol/L (ref 22–32)
Calcium: 9.1 mg/dL (ref 8.9–10.3)
Chloride: 97 mmol/L — ABNORMAL LOW (ref 98–111)
Creatinine, Ser: 1.78 mg/dL — ABNORMAL HIGH (ref 0.61–1.24)
GFR, Estimated: 37 mL/min — ABNORMAL LOW (ref >60)
Glucose, Bld: 135 mg/dL — ABNORMAL HIGH (ref 70–99)
Potassium: 4.8 mmol/L (ref 3.5–5.1)
Sodium: 124 mmol/L — ABNORMAL LOW (ref 135–145)

## 2022-02-06 LAB — SARS CORONAVIRUS 2 BY RT PCR: SARS Coronavirus 2 by RT PCR: NEGATIVE

## 2022-02-06 LAB — OSMOLALITY, URINE: Osmolality, Ur: 291 mOsm/kg — ABNORMAL LOW (ref 300–900)

## 2022-02-06 LAB — OSMOLALITY: Osmolality: 276 mOsm/kg (ref 275–295)

## 2022-02-06 LAB — BRAIN NATRIURETIC PEPTIDE: B Natriuretic Peptide: 360.7 pg/mL — ABNORMAL HIGH (ref 0.0–100.0)

## 2022-02-06 LAB — PROCALCITONIN: Procalcitonin: <0.1 ng/mL

## 2022-02-06 LAB — LACTIC ACID, PLASMA
Lactic Acid, Venous: 1 mmol/L (ref 0.5–1.9)
Lactic Acid, Venous: 1.7 mmol/L (ref 0.5–1.9)

## 2022-02-06 MED ORDER — CLOPIDOGREL BISULFATE 75 MG PO TABS
75.0000 mg | ORAL_TABLET | Freq: Every day | ORAL | Status: DC
Start: 2022-02-07 — End: 2022-02-08
  Administered 2022-02-07: 75 mg via ORAL
  Filled 2022-02-06: qty 1

## 2022-02-06 MED ORDER — ATORVASTATIN CALCIUM 20 MG PO TABS
80.0000 mg | ORAL_TABLET | Freq: Every day | ORAL | Status: DC
  Administered 2022-02-06 – 2022-02-08 (×3): 80 mg via ORAL
  Filled 2022-02-06 (×3): qty 4

## 2022-02-06 MED ORDER — ALBUTEROL SULFATE (2.5 MG/3ML) 0.083% IN NEBU
3.0000 mL | INHALATION_SOLUTION | RESPIRATORY_TRACT | Status: DC | PRN
Start: 2022-02-06 — End: 2022-02-09
  Administered 2022-02-06 (×2): 3 mL via RESPIRATORY_TRACT
  Filled 2022-02-06 (×2): qty 3

## 2022-02-06 MED ORDER — ACETAMINOPHEN 500 MG PO TABS: 500.0000 mg | ORAL_TABLET | Freq: Four times a day (QID) | ORAL | Status: DC | PRN

## 2022-02-06 MED ORDER — IPRATROPIUM-ALBUTEROL 0.5-2.5 (3) MG/3ML IN SOLN
3.0000 mL | Freq: Four times a day (QID) | RESPIRATORY_TRACT | Status: DC
Start: 2022-02-06 — End: 2022-02-09
  Administered 2022-02-06 – 2022-02-09 (×11): 3 mL via RESPIRATORY_TRACT
  Filled 2022-02-06 (×11): qty 3

## 2022-02-06 MED ORDER — HEPARIN SODIUM (PORCINE) 5000 UNIT/ML IJ SOLN
5000.0000 [IU] | Freq: Two times a day (BID) | INTRAMUSCULAR | Status: DC
  Filled 2022-02-06 (×2): qty 1

## 2022-02-06 MED ORDER — ALBUTEROL SULFATE (2.5 MG/3ML) 0.083% IN NEBU: 2.5000 mg | INHALATION_SOLUTION | Freq: Four times a day (QID) | RESPIRATORY_TRACT | Status: DC | PRN

## 2022-02-06 MED ORDER — LACTATED RINGERS IV BOLUS
1000.0000 mL | Freq: Once | INTRAVENOUS | Status: AC
  Administered 2022-02-06: 1000 mL via INTRAVENOUS

## 2022-02-06 MED ORDER — EZETIMIBE 10 MG PO TABS
10.0000 mg | ORAL_TABLET | Freq: Every day | ORAL | Status: DC
  Administered 2022-02-07 – 2022-02-09 (×3): 10 mg via ORAL
  Filled 2022-02-06 (×3): qty 1

## 2022-02-06 MED ORDER — AMIODARONE HCL 200 MG PO TABS
200.0000 mg | ORAL_TABLET | Freq: Every day | ORAL | Status: DC
  Administered 2022-02-07 – 2022-02-09 (×3): 200 mg via ORAL
  Filled 2022-02-06 (×4): qty 1

## 2022-02-06 MED ORDER — LORATADINE 10 MG PO TABS: 10.0000 mg | ORAL_TABLET | Freq: Every day | ORAL | Status: DC | PRN

## 2022-02-06 MED ORDER — LISINOPRIL 10 MG PO TABS
10.0000 mg | ORAL_TABLET | Freq: Every day | ORAL | Status: DC
  Administered 2022-02-07 – 2022-02-09 (×3): 10 mg via ORAL
  Filled 2022-02-06 (×3): qty 1

## 2022-02-06 MED ORDER — PENTOXIFYLLINE ER 400 MG PO TBCR
400.0000 mg | EXTENDED_RELEASE_TABLET | Freq: Two times a day (BID) | ORAL | Status: DC
  Administered 2022-02-07 – 2022-02-09 (×5): 400 mg via ORAL
  Filled 2022-02-06 (×7): qty 1

## 2022-02-06 MED ORDER — AMLODIPINE BESYLATE 10 MG PO TABS
10.0000 mg | ORAL_TABLET | Freq: Every day | ORAL | Status: DC
  Administered 2022-02-06 – 2022-02-09 (×4): 10 mg via ORAL
  Filled 2022-02-06 (×4): qty 1

## 2022-02-06 MED ORDER — ASPIRIN 81 MG PO TBEC
81.0000 mg | DELAYED_RELEASE_TABLET | Freq: Every day | ORAL | Status: DC
  Administered 2022-02-07: 81 mg via ORAL
  Filled 2022-02-06: qty 1

## 2022-02-06 MED ORDER — SODIUM CHLORIDE 0.9 % IV SOLN
1.0000 g | Freq: Once | INTRAVENOUS | Status: AC
  Administered 2022-02-06: 1 g via INTRAVENOUS
  Filled 2022-02-06: qty 10

## 2022-02-06 MED ORDER — IOHEXOL 350 MG/ML SOLN
75.0000 mL | Freq: Once | INTRAVENOUS | Status: AC | PRN
  Administered 2022-02-06: 75 mL via INTRAVENOUS

## 2022-02-06 MED ORDER — AZITHROMYCIN 500 MG IV SOLR
500.0000 mg | Freq: Once | INTRAVENOUS | Status: AC
  Administered 2022-02-06: 500 mg via INTRAVENOUS
  Filled 2022-02-06 (×2): qty 5

## 2022-02-06 MED ORDER — BENZONATATE 100 MG PO CAPS: 100.0000 mg | ORAL_CAPSULE | Freq: Three times a day (TID) | ORAL | Status: DC | PRN

## 2022-02-06 MED ORDER — IPRATROPIUM-ALBUTEROL 0.5-2.5 (3) MG/3ML IN SOLN
3.0000 mL | Freq: Once | RESPIRATORY_TRACT | Status: AC
  Administered 2022-02-06: 3 mL via RESPIRATORY_TRACT
  Filled 2022-02-06: qty 3

## 2022-02-06 MED ORDER — LEVOTHYROXINE SODIUM 50 MCG PO TABS
50.0000 ug | ORAL_TABLET | Freq: Every day | ORAL | Status: DC
  Administered 2022-02-07 – 2022-02-09 (×3): 50 ug via ORAL
  Filled 2022-02-06 (×3): qty 1

## 2022-02-06 NOTE — ED Provider Notes (Addendum)
[facility] Provider Note    Event Date/Time   First MD Initiated Contact with Patient 02/06/22 1330     (approximate)   History   Shortness of Breath   HPI  Ralph Drake is a 87 y.o. male past medical history of coronary artery disease status post PCI, bradycardia, GERD, hypertension hyperlipidemia, VT status post ICD placement and ischemic cardiomyopathy who presents with shortness of breath.  Has been short of breath over the last 2 weeks.  Has seen his PCP twice and has completed 2 courses of antibiotics and steroids.  Last took antibiotics and steroids on Sunday.  He is not sure what antibiotics with the dosing.  He has cough that is minimally productive denies fevers or chills denies chest pain denies lower extremity edema.  Denies orthopnea.  The breathing has slowly gotten worse over the last 2 weeks but has been specifically worse over the last 2 days.  Pulse ox was 84 to 88% at home.  He has no history of prior diagnosed COPD does have history of tobacco use in the past but not currently. I see visit note from 8/7 was seen by Dr. Kary Kos who prescribed doxycycline and prednisone and Mucinex.  Slight he was then prescribed Augmentin on 8/17 as well as a 10-day prednisone taper.    Past Medical History:  Diagnosis Date   Bradycardia    CAD S/P percutaneous coronary angioplasty 1993   POBA to Cx x 2 occasions: 10/2015:Mod (mod-severe CAD): p-mCx 50%. mLAD ~40%, mRCA ~40%. dRCA ~70%, No culprit lesion for VT.   Diverticulosis    Dyslipidemia, goal LDL below 70    Former heavy tobacco smoker     quit in October 2014   GERD (gastroesophageal reflux disease)    Hyperlipidemia    Hypertension    Olecranon bursitis of left elbow    Osteoarthritis    Peripheral vascular disease (HCC)     H/O R SFA stent, L Iliac Stent 1996;;  Lower Extremity Angiography April 2015: Occluded left SFA reconstituting at end of the canal, heavily calcified. Three-vessel  runoff..;; RCA 50% ostial right common iliac, patent SFA stent -> Referred to Dr. Trula Slade [facility].   Postoperative atrial fibrillation (HCC)    No recurrence   S/P AVR (aortic valve replacement) 02/10/2013   23 [address] Magna Ease Pericardial Tissue Valve   Severe aortic stenosis by prior echocardiogram    per Dr. Rollene Fare 2012; referred for aortic valve replacement   Shortness of breath    Chronic   Trigger ring finger of left hand    Tubular adenoma of colon    VT (ventricular tachycardia)    hemodynamically significant VT on 11/07/2015, nonobstructive CAD, no culprit lesion, underwent St Jude Dual Chamber ICD by Dr. [name]    Patient Active Problem List   Diagnosis Date Noted   Long term current use of amiodarone 93/81/8299   Chronic systolic heart failure ([place]) 08/07/2019   ICD (implantable cardioverter-defibrillator) in place 08/07/2019   Claudication in peripheral vascular disease ([place]) 03/23/2019   Fatigue due to treatment 10/06/2016   V-tach [facility])    Sustained ventricular tachycardia ([place]) 11/08/2015   Bilateral claudication of lower limb ([place]) 10/01/2015   Dizziness 02/09/2015   Moderate claudication ([place]) 02/09/2015   Carotid bruit 08/12/2014   Stenosis of iliac artery (HCC) 11/09/2013   Pain in limb-Left > Right Leg 11/09/2013   Exertional dyspnea 10/01/2013   Postoperative atrial fibrillation ([place]) 02/15/2013  RBBB 02/13/2013   CAD S/P PTCA of CFX X 2 in '93    Dyslipidemia, goal LDL below 70    S/P tissue AVR 02/11/13 02/10/2013   Essential hypertension    GERD (gastroesophageal reflux disease)    Arthritis    Olecranon bursitis of left elbow    Trigger ring finger of left hand    Atherosclerotic peripheral vascular disease with intermittent claudication ([place]) 02/11/2002     Physical Exam  Triage Vital Signs: ED Triage Vitals [02/06/22 1330]  Enc Vitals Group     BP (!) 106/43     Pulse Rate 64     Resp (!) 23     Temp 98 F (36.7 C)     Temp src       SpO2 90 %     Weight      Height      Head Circumference      Peak Flow      Pain Score 4     Pain Loc      Pain Edu?      Excl. in [place]?     Most recent vital signs: Vitals:   02/06/22 1330  BP: (!) 106/43  Pulse: 64  Resp: (!) 23  Temp: 98 F (36.7 C)  SpO2: 90%     General: Awake, no distress.  CV:  Good peripheral perfusion.  No peripheral edema Resp:  Mildly dyspneic but speaking in full sentences, expiratory wheezing and crackles Abd:  No distention.  Neuro:             Awake, Alert, Oriented x 3  Other:     ED Results / Procedures / Treatments  Labs (all labs ordered are listed, but only abnormal results are displayed) Labs Reviewed  CBC WITH DIFFERENTIAL/PLATELET - Abnormal; Notable for the following components:      Result Value   Neutro Abs 7.8 (*)    Abs Immature Granulocytes 0.11 (*)    All other components within normal limits  BASIC METABOLIC PANEL - Abnormal; Notable for the following components:   Sodium 124 (*)    Chloride 97 (*)    Glucose, Bld 135 (*)    BUN 35 (*)    Creatinine, Ser 1.78 (*)    GFR, Estimated 37 (*)    All other components within normal limits  BRAIN NATRIURETIC PEPTIDE - Abnormal; Notable for the following components:   B Natriuretic Peptide 360.7 (*)    All other components within normal limits  TROPONIN I (HIGH SENSITIVITY) - Abnormal; Notable for the following components:   Troponin I (High Sensitivity) 51 (*)    All other components within normal limits  SARS CORONAVIRUS 2 BY RT PCR  PROCALCITONIN  LACTIC ACID, PLASMA  LACTIC ACID, PLASMA  OSMOLALITY, URINE  SODIUM, URINE, RANDOM     EKG  EKG reviewed interpreted by myself shows atrial paced rhythm with right bundle branch block morphology discordant T wave inversions similar to prior EKG   RADIOLOGY Chest x-ray interpreted myself shows left lower lobe effusion as well as scattered opacities which could be pulmonary edema versus  infiltrate   PROCEDURES:  Critical Care performed: Yes, see critical care procedure note(s)  Procedures  The patient is on the cardiac monitor to evaluate for evidence of arrhythmia and/or significant heart rate changes.   MEDICATIONS ORDERED IN ED: Medications  albuterol (PROVENTIL) (2.5 MG/3ML) 0.083% nebulizer solution 3 mL (3 mLs Inhalation Given 02/06/22 1501)  cefTRIAXone (ROCEPHIN) 1 g  in sodium chloride 0.9 % 100 mL IVPB (has no administration in time range)  azithromycin (ZITHROMAX) 500 mg in sodium chloride 0.9 % 250 mL IVPB (has no administration in time range)  ipratropium-albuterol (DUONEB) 0.5-2.5 (3) MG/3ML nebulizer solution 3 mL (has no administration in time range)  lactated ringers bolus 1,000 mL (1,000 mLs Intravenous New Bag/Given 02/06/22 1502)     IMPRESSION / MDM / ASSESSMENT AND PLAN / ED COURSE  I reviewed the triage vital signs and the nursing notes.                              Patient's presentation is most consistent with acute presentation with potential threat to life or bodily function.  Differential diagnosis includes, but is not limited to, pneumonia, CHF exacerbation, viral illness, pleural effusion, pulmonary hypertension, pulmonary embolism   The patient is an 87 year old male with history of CHF and history of coronary disease who presents with shortness of breath x2 weeks.  Has completed a course of doxycycline and Augmentin and has been on steroids prescribed by his PCP over the last 2 weeks.  Breathing issues have not improved and actually worsened over the last 2 days and was hypoxic at home on pulse ox.  He is also hypoxic on arrival to the ED satting about 90% on 3 L mildly tachypneic blood pressure is borderline low.  He does have some expiratory wheezing and crackles on exam no lower extremity edema.  He has no fevers mild cough is nonproductive no chest pain.  Chest x-ray has a left-sided pleural effusion and findings either of pulmonary  edema versus infiltrate.  Labs are still pending at this point.  He does not look overtly volume overloaded but does have history of HF.  I suspect that this is more infectious.  Will obtain CT chest without contrast to further evaluate.  Labs will also be useful and we will add on procalcitonin.  Patient will need admission given his hypoxia.   CT of the chest unfortunately shows a large left upper lobe lung mass with likely metastatic disease.  There is potentially a postobstructive pneumonia versus atelectasis.  Patient's labs are also notable for hyponatremia which could be due to underlying SIADH from malignancy versus volume depletion.  We will add on urine osms Patient's blood pressure is borderline so we will give some fluid.  I informed patient and family of the likely malignancy. I think that his degree of hypoxia is not completely explained by the lung parenchymal findings as well will obtain a CT angio to rule out pulmonary embolism.  Will admit to the hospitalist service.     FINAL CLINICAL IMPRESSION(S) / ED DIAGNOSES   Final diagnoses:  Acute respiratory failure with hypoxia ([place])  Lung mass  Hyponatremia     Rx / DC Orders   ED Discharge Orders     None        Note:  This document was prepared using Dragon voice recognition software and may include unintentional dictation errors.   Rada Hay, MD 02/06/22 1537    Rada Hay, MD 02/06/22 1540

## 2022-02-06 NOTE — H&P (Signed)
History and Physical    Ralph Drake WFU:[id number] DOB: 1934-11-13 DOA: 02/06/2022  PCP: [name], MD (Confirm with patient/family/NH records and if not entered, this has to be entered at [facility] point of entry) Patient coming from: Home  I have personally briefly reviewed patient's old medical records in [place]  Chief Complaint: SOB  HPI: Ralph Drake is a 87 y.o. male with medical history significant of CAD status post PCI, VT status post CAD and PPM, HTN, chronic HFpEF secondary to ischemic cardiomyopathy, HLD, CKD stage IIIb, PVD on Plavix, presented with worsening of wheezing and shortness of breath.  Symptoms started 3 to 4 weeks ago, was diagnosed with bronchitis and underwent 2 rounds of p.o. antibiotics and steroid/prednisone treatment by PCP.  Upon completion of antibiotics and steroids over the weekend, patient continued to experience constant shortness of breath and wheezing.  Last night, family checked his O2 saturation was 85% on room air.  Denies any fever chills, reported a few pounds of weight loss in recent months.  ED Course: Patient was found to be hypoxic stabilized on 3 L, with saturation 94%.  CT showed lung mass and bilateral hilar adenopathy concerning for metastatic lung cancer, CT angiogram negative for PE but confirmed lung mass obstructing left upper lobe and left pleural effusion as well as liver metastasis.  Review of Systems: As per HPI otherwise 14 point review of systems negative.    Past Medical History:  Diagnosis Date   Bradycardia    CAD S/P percutaneous coronary angioplasty 1993   POBA to Cx x 2 occasions: 10/2015:Mod (mod-severe CAD): p-mCx 50%. mLAD ~40%, mRCA ~40%. dRCA ~70%, No culprit lesion for VT.   Diverticulosis    Dyslipidemia, goal LDL below 70    Former heavy tobacco smoker     quit in October 2014   GERD (gastroesophageal reflux disease)    Hyperlipidemia    Hypertension    Olecranon bursitis of left elbow     Osteoarthritis    Peripheral vascular disease (HCC)     H/O R SFA stent, L Iliac Stent 1996;;  Lower Extremity Angiography April 2015: Occluded left SFA reconstituting at end of the canal, heavily calcified. Three-vessel runoff..;; RCA 50% ostial right common iliac, patent SFA stent -> Referred to Dr. Trula Slade of [facility].   Postoperative atrial fibrillation (HCC)    No recurrence   S/P AVR (aortic valve replacement) 02/10/2013   [address] Ease Pericardial Tissue Valve   Severe aortic stenosis by prior echocardiogram    per Dr. Rollene Fare 2012; referred for aortic valve replacement   Shortness of breath    Chronic   Trigger ring finger of left hand    Tubular adenoma of colon    VT (ventricular tachycardia) ([place])    hemodynamically significant VT on 11/07/2015, nonobstructive CAD, no culprit lesion, underwent St Jude Dual Chamber ICD by Dr. [name]    Past Surgical History:  Procedure Laterality Date   ABDOMINAL ANGIOGRAM  09/10/2013   Procedure: ABDOMINAL ANGIOGRAM;  Surgeon: [name], MD;  Location: [facility] CATH LAB;  Service: Cardiovascular;;   ABDOMINAL AORTOGRAM N/A 03/23/2019   Procedure: ABDOMINAL AORTOGRAM;  Surgeon: [name], MD;  Location: [place] CV LAB;  Service: Cardiovascular;  Laterality: N/A;   AORTIC VALVE REPLACEMENT N/A 02/10/2013   AORTIC VALVE REPLACEMENT (AVR);  Surgeon: Grace Isaac, MD;  Location: [facility] OR;  Service: Open Heart Surgery:  Dr. Servando Snare: [address] Ease Pericardia  ARTERIAL DOPPLER - PRE-CABG  02/06/2013   Bilateral 1-39% ICA stenosis   CARDIAC CATHETERIZATION  August 2014   Nonobstructive 30-50% RCA. No significant LAD disease. 30% mid circumflex.   CARDIAC CATHETERIZATION  11/10/2009   No intervention - recommend medical management   CARDIAC CATHETERIZATION N/A 11/09/2015   Procedure: Left Heart Cath and Coronary Angiography;  Surgeon: Belva Crome, MD;  Location: [facility] INVASIVE CV LAB: Mod (mod-severe CAD): p-mCx 50%. mLAD ~40%,  mRCA ~40%. dRCA ~70%, No culprit lesion for VT.   CARDIOVASCULAR STRESS TEST  06/24/2012   Diffuse LV hypokinesis with moderately depressed systolic function and EF 22%,   CORONARY ANGIOPLASTY  1993   Circumflex x 2    EP IMPLANTABLE DEVICE N/A 11/10/2015   Procedure: ICD Implant;  Surgeon: Evans Lance, MD;  Location: [place] CV LAB;  Service: Cardiovascular;  Laterality: N/A;   ICD GENERATOR CHANGEOUT N/A 05/31/2016   Procedure: ICD Generator Changeout;  Surgeon: Thompson Grayer, MD;  Location: [place] CV LAB;  Service: Cardiovascular;  Laterality: N/A;   ILIAC ARTERY STENT Left 1996   INTRAOPERATIVE TRANSESOPHAGEAL ECHOCARDIOGRAM N/A 02/10/2013   Procedure: INTRAOPERATIVE TRANSESOPHAGEAL ECHOCARDIOGRAM;  Surgeon: Grace Isaac, MD;  Location: [place];  Service: Open Heart Surgery;  Laterality: N/A;   LEFT AND RIGHT HEART CATHETERIZATION WITH CORONARY ANGIOGRAM N/A 01/28/2013   Procedure: LEFT AND RIGHT HEART CATHETERIZATION WITH CORONARY ANGIOGRAM;  Surgeon: Leonie Man, MD;  Location: [facility] CATH LAB;  Service: Cardiovascular;  Laterality: N/A;   LOWER EXTREMITY ANGIOGRAM N/A 09/10/2013   Procedure: LOWER EXTREMITY ANGIOGRAM;  Surgeon: [name], MD;  Location: [facility] CATH LAB;  Service: Cardiovascular;  Laterality: N/A;   LOWER EXTREMITY ANGIOGRAPHY Right 03/23/2019   Procedure: Lower Extremity Angiography;  Surgeon: [name], MD;  Location: [place] CV LAB;  Service: Cardiovascular;  Laterality: Right;   OLECRANON BURSECTOMY  07/23/2011   Procedure: OLECRANON BURSA;  Surgeon: Lorn Junes, MD;  Location: [place];  Service: Orthopedics;  Laterality: Left;  excision olecranon bursa left elbow patient had supraclaviular block in preop   PERIPHERAL VASCULAR ANGIOGRAM  07/19/1994   No intervention-recommend medical therapy   PERIPHERAL VASCULAR ANGIOGRAM  01/17/1995   Right SFA 80-90# lesion, dilatation performed with a Cordis "Optz 5" 54mm x 2cm, inflated a  4-7.5atm for 40 to 70 seconds. Completion angiogram done by hand with "bolus chase" runoff from Primal SFA to foot. [place] in reduction of 80-90% to 20% with excellent flow and without dissection   PERIPHERAL VASCULAR ANGIOGRAM  09/10/2013   Occluded left SFA, heavily calcified with reconstitution at adductor canal. Three-vessel runof.;; Right lower extremity noted 50% ostial right common iliac stenosis with roughly 20 mm gradient. Not considered significant.f   PERIPHERAL VASCULAR INTERVENTION Right 03/23/2019   Procedure: PERIPHERAL VASCULAR INTERVENTION;  Surgeon: [name], MD;  Location: [place] CV LAB;  Service: Cardiovascular;  Laterality: Right;  common iliac   right hand surgery     ROTATOR CUFF REPAIR Right    Superficial Femoral Atery stent Right 1996   TONSILLECTOMY     as child   TRANSTHORACIC ECHOCARDIOGRAM  03/23/2013   Post-Op Echo #1: EF 50-55%, Mild Conc LVH, Mild HK of Inferolateral wall with paradoxical septal motion (c/w post-op state); Well seated 23 mm Bioprosthetic Valve (area ~1.23 cm2 - normal for valve); Mild MR   TRANSTHORACIC ECHOCARDIOGRAM  March 2017   EF 50-55%. No RWMA.  GR 1 DD.  23 mm  bioprosthetic valve functioning normally.  Mild biatrial dilation.   TRANSTHORACIC ECHOCARDIOGRAM  08/2017   EF 50 and 55% with moderate LVH.  No regional wall motion normalities.  GR 1 DD.  Bioprosthetic aortic valve present -normal gradients.   TRIGGER FINGER RELEASE  07/23/2011   Procedure: RELEASE TRIGGER FINGER/A-1 PULLEY;  Surgeon: Lorn Junes, MD;  Location: [place];  Service: Orthopedics;  Laterality: Left;  release trigger left ring finger patient had supraclavicular block in preop     reports that he quit smoking about 9 years ago. His smoking use included cigarettes. He has a 30.00 pack-year smoking history. He has never used smokeless tobacco. He reports that he does not drink alcohol and does not use drugs.  No Known  Allergies  Family History  Problem Relation Age of Onset   Hypertension Mother    Cancer Mother        Oral   Stroke Father    Colon cancer Neg Hx    Stomach cancer Neg Hx    Esophageal cancer Neg Hx    Rectal cancer Neg Hx      Prior to Admission medications   Medication Sig Start Date End Date Taking? Authorizing Provider  amiodarone (PACERONE) 200 MG tablet TAKE 1 TABLET BY MOUTH  DAILY MONDAY THROUGH FRIDAY DO NOT TAKE ON SATURDAY OR  SUNDAY 07/18/21  Yes Evans Lance, MD  acetaminophen (TYLENOL) 500 MG tablet Take 500 mg by mouth every 6 (six) hours as needed for moderate pain or headache.    [provider]  amLODipine (NORVASC) 10 MG tablet TAKE 1 TABLET BY MOUTH  DAILY 03/28/21   Leonie Man, MD  aspirin 81 MG EC tablet Take 1 tablet (81 mg total) by mouth daily. 11/11/15   Baldwin Jamaica, PA-C  atorvastatin (LIPITOR) 80 MG tablet TAKE 1 TABLET BY MOUTH  DAILY AT 6 PM 03/28/21   Leonie Man, MD  clopidogrel (PLAVIX) 75 MG tablet TAKE 1 TABLET BY MOUTH  DAILY 03/28/21   Leonie Man, MD  ezetimibe (ZETIA) 10 MG tablet TAKE 1 TABLET BY MOUTH  DAILY 03/28/21   Leonie Man, MD  fish oil-omega-3 fatty acids 1000 MG capsule Take 1 g by mouth daily.     [provider]  levothyroxine (SYNTHROID, LEVOTHROID) 50 MCG tablet Take 50 mcg by mouth daily before breakfast.    [provider]  lisinopril (ZESTRIL) 10 MG tablet TAKE 1 TABLET BY MOUTH  DAILY 12/05/21   Leonie Man, MD  loratadine (CLARITIN) 10 MG tablet Take 10 mg by mouth daily as needed for allergies.    [provider]  Multiple Vitamins-Minerals (MULTIVITAMIN WITH MINERALS) tablet Take 1 tablet by mouth daily.    [provider]  pentoxifylline (TRENTAL) 400 MG CR tablet TAKE 1 TABLET BY MOUTH  TWICE DAILY WITH MEALS 07/18/21   Leonie Man, MD  vitamin E 400 UNIT capsule Take 2 capsules by mouth daily.    [provider]    Physical  Exam: Vitals:   02/06/22 1330 02/06/22 1616  BP: (!) 106/43 (!) 152/64  Pulse: 64 67  Resp: (!) 23 18  Temp: 98 F (36.7 C) 97.6 F (36.4 C)  TempSrc:  Oral  SpO2: 90% 94%    Constitutional: NAD, calm, comfortable Vitals:   02/06/22 1330 02/06/22 1616  BP: (!) 106/43 (!) 152/64  Pulse: 64 67  Resp: (!) 23 18  Temp: 98 F (36.7  C) 97.6 F (36.4 C)  TempSrc:  Oral  SpO2: 90% 94%   Eyes: PERRL, lids and conjunctivae normal ENMT: Mucous membranes are moist. Posterior pharynx clear of any exudate or lesions.Normal dentition.  Neck: normal, supple, no masses, no thyromegaly Respiratory: Diminished breathing sounds on left lung, fixed wheezing on the left mid field, no crackles.  Increasing breathing effort. No accessory muscle use.  Cardiovascular: Regular rate and rhythm, no murmurs / rubs / gallops. No extremity edema. 2+ pedal pulses. No carotid bruits.  Abdomen: no tenderness, no masses palpated. No hepatosplenomegaly. Bowel sounds positive.  Musculoskeletal: no clubbing / cyanosis. No joint deformity upper and lower extremities. Good ROM, no contractures. Normal muscle tone.  Skin: no rashes, lesions, ulcers. No induration Neurologic: CN 2-12 grossly intact. Sensation intact, DTR normal. Strength 5/5 in all 4.  Psychiatric: Normal judgment and insight. Alert and oriented x 3. Normal mood.     Labs on Admission: I have personally reviewed following labs and imaging studies  CBC: Recent Labs  Lab 02/06/22 1410  WBC 9.5  NEUTROABS 7.8*  HGB 13.4  HCT 39.5  MCV 90.0  PLT 409   Basic Metabolic Panel: Recent Labs  Lab 02/06/22 1410  NA 124*  K 4.8  CL 97*  CO2 22  GLUCOSE 135*  BUN 35*  CREATININE 1.78*  CALCIUM 9.1   GFR: CrCl cannot be calculated (Unknown ideal weight.). Liver Function Tests: No results for input(s): "AST", "ALT", "ALKPHOS", "BILITOT", "PROT", "ALBUMIN" in the last 168 hours. No results for input(s): "LIPASE", "AMYLASE" in the last 168  hours. No results for input(s): "AMMONIA" in the last 168 hours. Coagulation Profile: No results for input(s): "INR", "PROTIME" in the last 168 hours. Cardiac Enzymes: No results for input(s): "CKTOTAL", "CKMB", "CKMBINDEX", "TROPONINI" in the last 168 hours. BNP (last 3 results) No results for input(s): "PROBNP" in the last 8760 hours. HbA1C: No results for input(s): "HGBA1C" in the last 72 hours. CBG: No results for input(s): "GLUCAP" in the last 168 hours. Lipid Profile: No results for input(s): "CHOL", "HDL", "LDLCALC", "TRIG", "CHOLHDL", "LDLDIRECT" in the last 72 hours. Thyroid Function Tests: No results for input(s): "TSH", "T4TOTAL", "FREET4", "T3FREE", "THYROIDAB" in the last 72 hours. Anemia Panel: No results for input(s): "VITAMINB12", "FOLATE", "FERRITIN", "TIBC", "IRON", "RETICCTPCT" in the last 72 hours. Urine analysis:    Component Value Date/Time   COLORURINE YELLOW 02/06/2013 [place] 02/06/2013 1139   LABSPEC 1.012 02/06/2013 1139   PHURINE 6.5 02/06/2013 1139   GLUCOSEU NEGATIVE 02/06/2013 1139   HGBUR NEGATIVE 02/06/2013 1139   BILIRUBINUR NEGATIVE 02/06/2013 1139   KETONESUR NEGATIVE 02/06/2013 1139   PROTEINUR NEGATIVE 02/06/2013 1139   UROBILINOGEN 0.2 02/06/2013 1139   NITRITE NEGATIVE 02/06/2013 1139   LEUKOCYTESUR NEGATIVE 02/06/2013 1139    Radiological Exams on Admission: CT Angio Chest PE W and/or Wo Contrast  Result Date: 02/06/2022 CLINICAL DATA:  Shortness of breath, history cardiomyopathy and coronary artery disease, hypoxia EXAM: CT ANGIOGRAPHY CHEST WITH CONTRAST TECHNIQUE: Multidetector CT imaging of the chest was performed using the standard protocol during bolus administration of intravenous contrast. Multiplanar CT image reconstructions and MIPs were obtained to evaluate the vascular anatomy. RADIATION DOSE REDUCTION: This exam was performed according to the departmental dose-optimization program which includes automated  exposure control, adjustment of the mA and/or kV according to patient size and/or use of iterative reconstruction technique. CONTRAST:  31mL OMNIPAQUE IOHEXOL 350 MG/ML SOLN COMPARISON:  02/06/2022 FINDINGS: Cardiovascular: This is a technically adequate  evaluation of the pulmonary vasculature. There are no filling defects or pulmonary emboli. There is extrinsic narrowing of the left upper and left lower lobe pulmonary arteries due to the large mass described on previous CT imaging. The heart is enlarged without pericardial effusion. ICD unchanged. No evidence of thoracic aortic aneurysm. Diffuse atherosclerosis of the aorta and native coronary vasculature. Mediastinum/Nodes: Bulky mediastinal adenopathy is identified consistent with metastatic disease. Lymph node mass in the subcarinal station measures 4.4 cm reference image 68/4. Precarinal adenopathy measures up to 2.5 cm in short axis, reference image 37/4. Lymph node mass within the AP window measures up to 3.4 cm in short axis, reference image 44/4. Right hilar adenopathy measures up to 2.6 cm in short axis reference image 81/4. Thyroid, trachea, and esophagus are grossly unremarkable. Lungs/Pleura: Large infiltrating left suprahilar mass is identified measuring at least 6.0 x 4.9 cm reference image 46/4. Postobstructive changes are seen within the left upper lobe, with left upper lobe interlobular septal thickening most consistent with lymphangitic spread of disease. There is a small left pleural effusion volume estimated less than 500 cc. Mild left apical pleural thickening is also noted. Compressive atelectasis left lower lobe. There is a subpleural right upper lobe nodule image 76/4 measuring 7 mm. Metastatic disease cannot be excluded. Remaining portions of the right chest are clear. Upper Abdomen: Numerous hypodensities are seen throughout the liver parenchyma consistent with metastatic disease. Index lesion left lobe liver image 128/4 measures 2.6 x 2.5  cm. Musculoskeletal: There are no acute or destructive bony lesions. Reconstructed images demonstrate no additional findings. Review of the MIP images confirms the above findings. IMPRESSION: 1. No evidence of pulmonary embolus. 2. Infiltrative left suprahilar mass consistent with malignancy. Postobstructive changes in the left upper lobe, as well as evidence of lymphangitic spread of disease. 3. Extensive mediastinal and bilateral hilar lymphadenopathy consistent with nodal metastases. 4. Extensive hepatic metastases. 5. Right middle lobe 7 mm nodule concerning for metastatic disease. 6. Small left pleural effusion and left apical pleural thickening. 7.  Aortic Atherosclerosis (ICD10-I70.0). Electronically Signed   By: Randa Ngo M.D.   On: 02/06/2022 16:19   CT Chest Wo Contrast  Result Date: 02/06/2022 CLINICAL DATA:  Persistent cough.  Shortness of breath. EXAM: CT CHEST WITHOUT CONTRAST TECHNIQUE: Multidetector CT imaging of the chest was performed following the standard protocol without IV contrast. RADIATION DOSE REDUCTION: This exam was performed according to the departmental dose-optimization program which includes automated exposure control, adjustment of the mA and/or kV according to patient size and/or use of iterative reconstruction technique. COMPARISON:  Radiograph of same day.  CT scan November 11, 2009. FINDINGS: Cardiovascular: Status post aortic valve repair. Atherosclerosis of thoracic aorta is noted without aneurysm formation. Normal cardiac size. No pericardial effusion. Moderate coronary artery calcifications are noted. Mediastinum/Nodes: Thyroid gland is unremarkable. Esophagus is unremarkable. Extensive mediastinal adenopathy is now noted. Subcarinal adenopathy measures 5 cm in minor axis. Sense of adenopathy is noted in the anterior mediastinum and AP window, with the largest lymph node measuring 2.5 cm. Extensive right paratracheal adenopathy is noted with largest lymph node measuring  2.4 cm. Lungs/Pleura: No pneumothorax is noted. Small left pleural effusion is noted with adjacent subsegmental atelectasis. Large irregular left upper lobe density is noted most consistent with neoplasm or malignancy and some degree of postobstructive atelectasis or pneumonia. 11 mm nodule is noted in lingular segment of left upper lobe. 8 mm right middle lobe nodule is noted. Upper Abdomen: Multiple rounded low densities are  noted in the patent parenchyma consistent with metastatic disease. Musculoskeletal: No chest wall mass or suspicious bone lesions identified. IMPRESSION: Large irregular left upper lobe density is noted most consistent with neoplasm or malignancy and some degree of associated postobstructive atelectasis or pneumonia. Extensive mediastinal adenopathy is noted consistent with metastatic disease. Multiple hepatic metastases are noted. Pulmonary nodules are noted in the lingular segment of left upper lobe as well as in the right middle lobe concerning for metastatic disease. Small left pleural effusion is noted with adjacent subsegmental atelectasis. Moderate coronary artery calcifications are noted. Aortic Atherosclerosis (ICD10-I70.0). Electronically Signed   By: Marijo Conception M.D.   On: 02/06/2022 15:00   DG Chest 2 View  Result Date: 02/06/2022 CLINICAL DATA:  Shortness of breath EXAM: CHEST - 2 VIEW COMPARISON:  11/29/2015 FINDINGS: Left-sided implanted cardiac device. Cardiomegaly with prior sternotomy and aortic valve replacement. Aortic atherosclerosis. Small left pleural effusion with streaky bibasilar and left upper lobe airspace opacities. No pneumothorax. IMPRESSION: Small left pleural effusion with streaky bibasilar and left upper lobe airspace opacities. Findings may reflect pulmonary edema versus multifocal infection. Electronically Signed   By: Davina Poke D.O.   On: 02/06/2022 14:04    EKG: Independently reviewed. Pased  Assessment/Plan Principal Problem:    Hypoxia Active Problems:   Acute on chronic respiratory failure with hypoxia (HCC)  (please populate well all problems here in Problem List. (For example, if patient is on BP meds at home and you resume or decide to hold them, it is a problem that needs to be her. Same for CAD, COPD, HLD and so on)  Acute hypoxic respiratory Failure -Likely secondary to lung CA, appears to be metastatic to right lung and liver at least, especially obstructing at least left upper lobe which causing the fixed wheezing.  No significant postobstructive pneumonia on CT chest or CT angiogram, will not continue antibiotics.  Noted that patient also completed 2 weeks of antibiotics and prednisone with little help of his breathing symptoms. -Placed consultation to pulmonary, paged on-call pulmonary x2 waiting for response.  Expect accelerated bronchoscopy for tissue diagnosis.  For now, I ordered IR guided thoracentesis and cytology evaluation -Outpatient oncology once pathology results.  Explained to patient and his family at bedside, both agree with the plan.  Acute on chronic hyponatremia -Euvolemic, suspect SIADH, hyponatremia lab pending. -Enforce fluid restriction for now.  HTN -Continue home BP meds  CKD stage IIIb -Euvolemic, creatinine level stable.  History of VT -No events, continue amiodarone.  PVD -Continue aspirin Plavix and statin and pentoxifylline  Chronic HFpEF -Euvolemic, continue home meds.  DVT prophylaxis: Lovenox Code Status: Full code Family Communication: Wife and daughter at bedside Disposition Plan: Patient is sick with acute hypoxic respite failure and large metastatic lung cancer obstructing airway, requiring more than 2 midnight hospital stay Consults called: Paged on-call pulmonary Dr. Raul Del x3, waiting for reply Admission status: MedSurg admission  Lequita Halt MD [facility] Pager [phone]  02/06/2022, 4:30 PM

## 2022-02-06 NOTE — ED Triage Notes (Addendum)
Pt comes with c/o SOB. Pt states increased SOB with exertion. Pt has been on presidone for last two weeks. Pt states cough  and acute bronchitis that has gotten worse.   Pt O2 at 85 % RA., pt placed on 2L with no improvement, pt increased to 3L and now at 90%.  Pt has cough and increased fatigue and weakness.

## 2022-02-07 ENCOUNTER — Inpatient Hospital Stay: Payer: Medicare Other

## 2022-02-07 DIAGNOSIS — E871 Hypo-osmolality and hyponatremia: Secondary | ICD-10-CM

## 2022-02-07 DIAGNOSIS — R0902 Hypoxemia: Secondary | ICD-10-CM | POA: Diagnosis not present

## 2022-02-07 DIAGNOSIS — R918 Other nonspecific abnormal finding of lung field: Secondary | ICD-10-CM

## 2022-02-07 DIAGNOSIS — K769 Liver disease, unspecified: Secondary | ICD-10-CM

## 2022-02-07 DIAGNOSIS — J9601 Acute respiratory failure with hypoxia: Secondary | ICD-10-CM

## 2022-02-07 LAB — HEPATIC FUNCTION PANEL
ALT: 20 U/L (ref 0–44)
AST: 48 U/L — ABNORMAL HIGH (ref 15–41)
Albumin: 3.1 g/dL — ABNORMAL LOW (ref 3.5–5.0)
Alkaline Phosphatase: 64 U/L (ref 38–126)
Bilirubin, Direct: <0.1 mg/dL (ref 0.0–0.2)
Total Bilirubin: 0.2 mg/dL — ABNORMAL LOW (ref 0.3–1.2)
Total Protein: 5.6 g/dL — ABNORMAL LOW (ref 6.5–8.1)

## 2022-02-07 LAB — SODIUM: Sodium: 127 mmol/L — ABNORMAL LOW (ref 135–145)

## 2022-02-07 LAB — BODY FLUID CELL COUNT WITH DIFFERENTIAL
Eos, Fluid: 0 %
Lymphs, Fluid: 80 %
Monocyte-Macrophage-Serous Fluid: 15 % — ABNORMAL LOW (ref 50–90)
Neutrophil Count, Fluid: 5 % (ref 0–25)
Total Nucleated Cell Count, Fluid: 1834 cu mm — ABNORMAL HIGH (ref 0–1000)

## 2022-02-07 MED ORDER — METHYLPREDNISOLONE SODIUM SUCC 40 MG IJ SOLR
40.0000 mg | Freq: Every day | INTRAMUSCULAR | Status: DC
  Administered 2022-02-07: 40 mg via INTRAVENOUS
  Filled 2022-02-07: qty 1

## 2022-02-07 NOTE — Procedures (Signed)
PROCEDURE SUMMARY:  Successful US guided diagnostic and therapeutic left thoracentesis. Yielded 500 cc of serosanguineous fluid. Pt tolerated procedure well. No immediate complications.  Specimen was sent for labs. CXR ordered.  EBL < 1 mL  Tyson Alias, AGNP 02/07/2022 12:04 PM

## 2022-02-07 NOTE — Progress Notes (Signed)
  PROGRESS NOTE    Ralph Drake  OVZ:[id number] DOB: 04/05/1935 DOA: 02/06/2022 PCP: [name], MD  [id number]  LOS: 1 day   Brief hospital course: No notes on file  Assessment & Plan: Ralph Drake is a 87 y.o. male with medical history significant of CAD status post PCI, VT status post CAD and PPM, HTN, chronic HFpEF secondary to ischemic cardiomyopathy, HLD, CKD stage IIIb, PVD on Plavix, presented with worsening of wheezing and shortness of breath.   No notes have been filed under this hospital service. Service: Hospitalist  Acute hypoxic respiratory Failure --in the ED, Pt O2 at 85 % RA., pt placed on 2L with no improvement, pt increased to 3L and now at 90%. --Likely secondary to lung CA, appears to be metastatic to right lung and liver at least, especially obstructing at least left upper lobe which causing the fixed wheezing.  No significant postobstructive pneumonia on CT chest or CT angiogram.  Noted that patient also completed 2 weeks of antibiotics and prednisone with little help of his breathing symptoms.  Abx not continued. Plan: --Continue supplemental O2 to keep sats >=90%, wean as tolerated  Lung mass with liver mets --oncology consult today, with Dr. Tasia Catchings  Pleural left pleural effusion --s/p thoracentesis today with 500 ml removed  Acute hyponatremia -Euvolemic, suspect SIADH, hyponatremia lab pending.   HTN --cont amlodipine and Lisinopril   CKD stage IIIb -Euvolemic, creatinine level stable.   History of VT -continue amiodarone.   PVD -Continue aspirin Plavix and statin and pentoxifylline   Chronic HFpEF -Euvolemic   DVT prophylaxis: Heparin SQ Code Status: Full code  Family Communication: wife and brother updated at bedside today Level of care: Med-Surg Dispo:   The patient is from: home Anticipated d/c is to: home Anticipated d/c date is: 1-2 days Patient currently is not medically ready to d/c due to: pending oncology  eval   Subjective and Interval History:  Pt reported breathing a bit better.  Believed supplemental oxygen and nebs helped.   Objective: Vitals:   02/07/22 0800 02/07/22 0932 02/07/22 1323 02/07/22 1637  BP: (!) 125/55 (!) 175/67  (!) 151/69  Pulse: 62 83  66  Resp: 17 20  16   Temp: 97.7 F (36.5 C) 97.7 F (36.5 C)  99 F (37.2 C)  TempSrc:      SpO2: 95% 90% 92% 90%  Weight:      Height:        Intake/Output Summary (Last 24 hours) at 02/07/2022 1741 Last data filed at 02/07/2022 1300 Gross per 24 hour  Intake 600 ml  Output 1350 ml  Net -750 ml   Filed Weights   02/06/22 1733  Weight: 61.2 kg    Examination:   Constitutional: NAD, AAOx3, sitting at edge of bed HEENT: conjunctivae and lids normal, EOMI CV: No cyanosis.   RESP: normal respiratory effort, diffuse wheezing and rhonchi, on 3L SKIN: warm, dry Neuro: II - XII grossly intact.   Psych: Normal mood and affect.  Appropriate judgement and reason   Data Reviewed: I have personally reviewed labs and imaging studies  Time spent: 50 minutes  Enzo Bi, MD [facility] If 7PM-7AM, please contact night-coverage 02/07/2022, 5:41 PM

## 2022-02-07 NOTE — Consult Note (Addendum)
Hematology/Oncology Consult note [phone] [phone]      Patient Care Team: [name], MD as PCP - General (Family Medicine) Leonie Man, MD as PCP - Cardiology (Cardiology) Evans Lance, MD as PCP - Electrophysiology (Cardiology) Grace Isaac, MD (Inactive) as Consulting Physician (Cardiothoracic Surgery) Leonie Man, MD as Consulting Physician (Cardiology) Gean Quint, MD as Consulting Physician (Nephrology)   Name of the patient: Ralph Drake  [id number]  February 26, 1935   Date of visit: 02/07/22 REASON FOR COSULTATION:  Lung mass History of presenting illness-  87 y.o. male with PMH listed at below who presents to ER for evaluation of shortness of breath and wheezing, progressively getting worse.  He has noticed the symptoms about a month ago.  Had cough and wheezing was diagnosed with bronchitis and was treated with 2 rounds of antibiotics/steroids by primary care provider.  Symptoms did not improve.  Patient continues to experience constant shortness of breath, wheezing and cough.  Home oxygen saturation readings were low.  Patient has decreased appetite, lost 4-5 pounds. Upon arrival to ED, patient was found to be hypoxic, started on nasal cannula oxygen with improvement of saturation.   02/06/2022, chest x-ray showed small left pleural effusion with streaky bibasilar and left upper lobe airspace opacities. 02/06/2022, CT chest without contrast showed large irregular left upper lobe density, concerning for malignancy and some degree of associated postobstructive atelectasis/pneumonia.  Extensive mediastinal adenopathy is noted consistent with metastatic disease.  Multiple hepatic metastasis are noted.  Pulmonary nodules are noted in the lingular segment of left upper lobe as well as in the right middle lobe concerning for metastatic disease.  Small left pleural effusion is noted with adjacent subsegmental atelectasis.  Moderate coronary  artery calcifications are noted.  Aortic atherosclerosis.  02/07/2022, status post left diagnostic thoracentesis.  Cytology is pending. Patient was found to be hyponatremia with sodium level of 124, serum osmolarity 276, urine osmolarity 290. Procalcitonin less than 0.1, BNP 360   No Known Allergies  Patient Active Problem List   Diagnosis Date Noted   Acute on chronic respiratory failure with hypoxia ([place]) 02/06/2022   Hypoxia 02/06/2022   Long term current use of amiodarone 70/96/2836   Chronic systolic heart failure ([place]) 08/07/2019   ICD (implantable cardioverter-defibrillator) in place 08/07/2019   Claudication in peripheral vascular disease ([place]) 03/23/2019   Fatigue due to treatment 10/06/2016   V-tach [facility])    Sustained ventricular tachycardia ([place]) 11/08/2015   Bilateral claudication of lower limb ([place]) 10/01/2015   Dizziness 02/09/2015   Moderate claudication ([place]) 02/09/2015   Carotid bruit 08/12/2014   Stenosis of iliac artery (HCC) 11/09/2013   Pain in limb-Left > Right Leg 11/09/2013   Exertional dyspnea 10/01/2013   Postoperative atrial fibrillation ([place]) 02/15/2013   RBBB 02/13/2013   CAD S/P PTCA of CFX X 2 in '93    Dyslipidemia, goal LDL below 70    S/P tissue AVR 02/11/13 02/10/2013   Essential hypertension    GERD (gastroesophageal reflux disease)    Arthritis    Olecranon bursitis of left elbow    Trigger ring finger of left hand    Atherosclerotic peripheral vascular disease with intermittent claudication ([place]) 02/11/2002     Past Medical History:  Diagnosis Date   Bradycardia    CAD S/P percutaneous coronary angioplasty 1993   POBA to Cx x 2 occasions: 10/2015:Mod (mod-severe CAD): p-mCx 50%. mLAD ~40%, mRCA ~40%. dRCA ~70%, No culprit lesion for VT.   Diverticulosis  Dyslipidemia, goal LDL below 70    Former heavy tobacco smoker     quit in October 2014   GERD (gastroesophageal reflux disease)    Hyperlipidemia    Hypertension    Olecranon  bursitis of left elbow    Osteoarthritis    Peripheral vascular disease ([place])     H/O R SFA stent, L Iliac Stent 1996;;  Lower Extremity Angiography April 2015: Occluded left SFA reconstituting at end of the canal, heavily calcified. Three-vessel runoff..;; RCA 50% ostial right common iliac, patent SFA stent -> Referred to Dr. Trula Slade of [facility].   Postoperative atrial fibrillation (HCC)    No recurrence   S/P AVR (aortic valve replacement) 02/10/2013   [address] Ease Pericardial Tissue Valve   Severe aortic stenosis by prior echocardiogram    per Dr. Rollene Fare 2012; referred for aortic valve replacement   Shortness of breath    Chronic   Trigger ring finger of left hand    Tubular adenoma of colon    VT (ventricular tachycardia) ([place])    hemodynamically significant VT on 11/07/2015, nonobstructive CAD, no culprit lesion, underwent St Jude Dual Chamber ICD by Dr. [name]     Past Surgical History:  Procedure Laterality Date   ABDOMINAL ANGIOGRAM  09/10/2013   Procedure: ABDOMINAL ANGIOGRAM;  Surgeon: [name], MD;  Location[facility] CATH LAB;  Service: Cardiovascular;;   ABDOMINAL AORTOGRAM N/A 03/23/2019   Procedure: ABDOMINAL AORTOGRAM;  Surgeon: [name], MD;  Location: [place] CV LAB;  Service: Cardiovascular;  Laterality: N/A;   AORTIC VALVE REPLACEMENT N/A 02/10/2013   AORTIC VALVE REPLACEMENT (AVR);  Surgeon: Grace Isaac, MD;  Location: [facility] OR;  Service: Open Heart Surgery:  Dr. Servando Snare: [address] Ease Pericardia   ARTERIAL DOPPLER - PRE-CABG  02/06/2013   Bilateral 1-39% ICA stenosis   CARDIAC CATHETERIZATION  August 2014   Nonobstructive 30-50% RCA. No significant LAD disease. 30% mid circumflex.   CARDIAC CATHETERIZATION  11/10/2009   No intervention - recommend medical management   CARDIAC CATHETERIZATION N/A 11/09/2015   Procedure: Left Heart Cath and Coronary Angiography;  Surgeon: Belva Crome, MD;  Location: [facility] INVASIVE CV LAB: Mod  (mod-severe CAD): p-mCx 50%. mLAD ~40%, mRCA ~40%. dRCA ~70%, No culprit lesion for VT.   CARDIOVASCULAR STRESS TEST  06/24/2012   Diffuse LV hypokinesis with moderately depressed systolic function and EF 79%,   CORONARY ANGIOPLASTY  1993   Circumflex x 2    EP IMPLANTABLE DEVICE N/A 11/10/2015   Procedure: ICD Implant;  Surgeon: Evans Lance, MD;  Location: [place] CV LAB;  Service: Cardiovascular;  Laterality: N/A;   ICD GENERATOR CHANGEOUT N/A 05/31/2016   Procedure: ICD Generator Changeout;  Surgeon: Thompson Grayer, MD;  Location: [place] CV LAB;  Service: Cardiovascular;  Laterality: N/A;   ILIAC ARTERY STENT Left 1996   INTRAOPERATIVE TRANSESOPHAGEAL ECHOCARDIOGRAM N/A 02/10/2013   Procedure: INTRAOPERATIVE TRANSESOPHAGEAL ECHOCARDIOGRAM;  Surgeon: Grace Isaac, MD;  Location: [place];  Service: Open Heart Surgery;  Laterality: N/A;   LEFT AND RIGHT HEART CATHETERIZATION WITH CORONARY ANGIOGRAM N/A 01/28/2013   Procedure: LEFT AND RIGHT HEART CATHETERIZATION WITH CORONARY ANGIOGRAM;  Surgeon: Leonie Man, MD;  Location: [facility] CATH LAB;  Service: Cardiovascular;  Laterality: N/A;   LOWER EXTREMITY ANGIOGRAM N/A 09/10/2013   Procedure: LOWER EXTREMITY ANGIOGRAM;  Surgeon: [name], MD;  Location: [facility] CATH LAB;  Service: Cardiovascular;  Laterality: N/A;   LOWER EXTREMITY ANGIOGRAPHY  Right 03/23/2019   Procedure: Lower Extremity Angiography;  Surgeon: [name], MD;  Location: [place] CV LAB;  Service: Cardiovascular;  Laterality: Right;   OLECRANON BURSECTOMY  07/23/2011   Procedure: OLECRANON BURSA;  Surgeon: Lorn Junes, MD;  Location: [place];  Service: Orthopedics;  Laterality: Left;  excision olecranon bursa left elbow patient had supraclaviular block in preop   PERIPHERAL VASCULAR ANGIOGRAM  07/19/1994   No intervention-recommend medical therapy   PERIPHERAL VASCULAR ANGIOGRAM  01/17/1995   Right SFA 80-90# lesion, dilatation performed with a  Cordis "Optz 5" 45mm x 2cm, inflated a 4-7.5atm for 40 to 70 seconds. Completion angiogram done by hand with "bolus chase" runoff from Primal SFA to foot. [place] in reduction of 80-90% to 20% with excellent flow and without dissection   PERIPHERAL VASCULAR ANGIOGRAM  09/10/2013   Occluded left SFA, heavily calcified with reconstitution at adductor canal. Three-vessel runof.;; Right lower extremity noted 50% ostial right common iliac stenosis with roughly 20 mm gradient. Not considered significant.f   PERIPHERAL VASCULAR INTERVENTION Right 03/23/2019   Procedure: PERIPHERAL VASCULAR INTERVENTION;  Surgeon: [name], MD;  Location: [place] CV LAB;  Service: Cardiovascular;  Laterality: Right;  common iliac   right hand surgery     ROTATOR CUFF REPAIR Right    Superficial Femoral Atery stent Right 1996   TONSILLECTOMY     as child   TRANSTHORACIC ECHOCARDIOGRAM  03/23/2013   Post-Op Echo #1: EF 50-55%, Mild Conc LVH, Mild HK of Inferolateral wall with paradoxical septal motion (c/w post-op state); Well seated 23 mm Bioprosthetic Valve (area ~1.23 cm2 - normal for valve); Mild MR   TRANSTHORACIC ECHOCARDIOGRAM  March 2017   EF 50-55%. No RWMA.  GR 1 DD.  23 mm bioprosthetic valve functioning normally.  Mild biatrial dilation.   TRANSTHORACIC ECHOCARDIOGRAM  08/2017   EF 50 and 55% with moderate LVH.  No regional wall motion normalities.  GR 1 DD.  Bioprosthetic aortic valve present -normal gradients.   TRIGGER FINGER RELEASE  07/23/2011   Procedure: RELEASE TRIGGER FINGER/A-1 PULLEY;  Surgeon: Lorn Junes, MD;  Location: [place];  Service: Orthopedics;  Laterality: Left;  release trigger left ring finger patient had supraclavicular block in preop    Social History   Socioeconomic History   Marital status: Married    Spouse name: Not on file   Number of children: Not on file   Years of education: Not on file   Highest education level: Not on file   Occupational History   Not on file  Tobacco Use   Smoking status: Former    Packs/day: 0.50    Years: 60.00    Total pack years: 30.00    Types: Cigarettes    Quit date: 01/31/2013    Years since quitting: 9.0   Smokeless tobacco: Never  Vaping Use   Vaping Use: Never used  Substance and Sexual Activity   Alcohol use: No    Alcohol/week: 0.0 standard drinks of alcohol   Drug use: No   Sexual activity: Not on file  Other Topics Concern   Not on file  Social History Narrative   Married with no children. He long-term smoker who quit prior to his aVR in 2014.    Up until his operation he worked part-time at [employer].  He is not yet gone back to work postoperatively.   He chose not be cardiac rehabilitation, but has gone back to  walking exercising daily.   Social Determinants of Health   Financial Resource Strain: Not on file  Food Insecurity: Not on file  Transportation Needs: Not on file  Physical Activity: Not on file  Stress: Not on file  Social Connections: Not on file  Intimate Partner Violence: Not on file     Family History  Problem Relation Age of Onset   Hypertension Mother    Cancer Mother        Oral   Stroke Father    Colon cancer Neg Hx    Stomach cancer Neg Hx    Esophageal cancer Neg Hx    Rectal cancer Neg Hx      Current Facility-Administered Medications:    acetaminophen (TYLENOL) tablet 500 mg, 500 mg, Oral, Q6H PRN, Wynetta Fines T, MD   albuterol (PROVENTIL) (2.5 MG/3ML) 0.083% nebulizer solution 2.5 mg, 2.5 mg, Nebulization, Q6H PRN, Wynetta Fines T, MD   albuterol (PROVENTIL) (2.5 MG/3ML) 0.083% nebulizer solution 3 mL, 3 mL, Inhalation, Q2H PRN, Rada Hay, MD, 3 mL at 02/06/22 1855   amiodarone (PACERONE) tablet 200 mg, 200 mg, Oral, Daily, [place], Ping T, MD, 200 mg at 02/07/22 0943   amLODipine (NORVASC) tablet 10 mg, 10 mg, Oral, Daily, Wynetta Fines T, MD, 10 mg at 02/07/22 6503   aspirin EC tablet 81 mg, 81 mg, Oral,  Daily, Wynetta Fines T, MD, 81 mg at 02/07/22 0943   atorvastatin (LIPITOR) tablet 80 mg, 80 mg, Oral, Daily, Wynetta Fines T, MD, 80 mg at 02/06/22 1819   benzonatate (TESSALON) capsule 100 mg, 100 mg, Oral, TID PRN, Wynetta Fines T, MD   clopidogrel (PLAVIX) tablet 75 mg, 75 mg, Oral, Daily, Wynetta Fines T, MD, 75 mg at 02/07/22 5465   ezetimibe (ZETIA) tablet 10 mg, 10 mg, Oral, Daily, [place], Ping T, MD, 10 mg at 02/07/22 0943   heparin injection 5,000 Units, 5,000 Units, Subcutaneous, Q12H, Zhang, Ping T, MD   ipratropium-albuterol (DUONEB) 0.5-2.5 (3) MG/3ML nebulizer solution 3 mL, 3 mL, Nebulization, Q6H, Wynetta Fines T, MD, 3 mL at 02/07/22 0749   levothyroxine (SYNTHROID) tablet 50 mcg, 50 mcg, Oral, QAC breakfast, Wynetta Fines T, MD, 50 mcg at 02/07/22 0533   lisinopril (ZESTRIL) tablet 10 mg, 10 mg, Oral, Daily, Wynetta Fines T, MD, 10 mg at 02/07/22 0942   loratadine (CLARITIN) tablet 10 mg, 10 mg, Oral, Daily PRN, Wynetta Fines T, MD   pentoxifylline (TRENTAL) CR tablet 400 mg, 400 mg, Oral, BID WC, Wynetta Fines T, MD, 400 mg at 02/07/22 6812  Review of Systems  Constitutional:  Positive for appetite change, fatigue and unexpected weight change.  HENT:   Negative for trouble swallowing and voice change.   Eyes:  Negative for icterus.  Respiratory:  Positive for cough, shortness of breath and wheezing.   Cardiovascular:  Negative for chest pain and palpitations.  Gastrointestinal:  Negative for abdominal distention, abdominal pain and blood in stool.  Genitourinary:  Negative for difficulty urinating.   Musculoskeletal:  Negative for arthralgias.  Skin:  Negative for rash.  Neurological:  Negative for headaches.  Psychiatric/Behavioral:  Negative for confusion.     Physical exam:  Vitals:   02/07/22 0522 02/07/22 0749 02/07/22 0800 02/07/22 0932  BP: 130/60  (!) 125/55 (!) 175/67  Pulse: (!) 59  62 83  Resp: 18  17 20   Temp: 97.8 F (36.6 C)  97.7 F (36.5 C) 97.7 F (36.5 C)   TempSrc: Oral  SpO2: 92% 90% 95% 90%  Weight:      Height:       Physical Exam Constitutional:      General: He is not in acute distress.    Appearance: He is not diaphoretic.  HENT:     Head: Normocephalic and atraumatic.     Nose: Nose normal.     Mouth/Throat:     Pharynx: No oropharyngeal exudate.  Eyes:     General: No scleral icterus.    Pupils: Pupils are equal, round, and reactive to light.  Cardiovascular:     Rate and Rhythm: Normal rate.     Heart sounds: No murmur heard. Pulmonary:     Effort: Pulmonary effort is normal. No respiratory distress.     Breath sounds: Wheezing present.     Comments:  diffuse wheezing and rhonc Abdominal:     General: There is no distension.     Palpations: Abdomen is soft.     Tenderness: There is no abdominal tenderness.  Musculoskeletal:        General: Normal range of motion.     Cervical back: Normal range of motion and neck supple.  Skin:    General: Skin is warm and dry.     Findings: No erythema.  Neurological:     Mental Status: He is alert and oriented to person, place, and time. Mental status is at baseline.     Cranial Nerves: No cranial nerve deficit.     Motor: No abnormal muscle tone.     Coordination: Coordination normal.  Psychiatric:        Mood and Affect: Mood and affect normal.      Laboratory orders     Latest Ref Rng & Units 02/06/2022    2:10 PM 08/17/2020    9:59 AM 03/24/2019    2:56 AM  CBC  WBC 4.0 - 10.5 K/uL 9.5  5.8  7.2   Hemoglobin 13.0 - 17.0 g/dL 13.4  13.9  12.6   Hematocrit 39.0 - 52.0 % 39.5  40.5  37.2   Platelets 150 - 400 K/uL 189  198  201       Latest Ref Rng & Units 02/06/2022    5:15 PM 02/06/2022    2:10 PM 02/06/2021    8:04 AM  CMP  Glucose 70 - 99 mg/dL  135  93   BUN 8 - 23 mg/dL  35  32   Creatinine 0.61 - 1.24 mg/dL  1.78  1.74   Sodium 135 - 145 mmol/L 127  124  137   Potassium 3.5 - 5.1 mmol/L  4.8  5.6   Chloride 98 - 111 mmol/L  97  100   CO2 22 - 32  mmol/L  22  22   Calcium 8.9 - 10.3 mg/dL  9.1  11.0   Total Protein 6.5 - 8.1 g/dL 5.6   6.8   Total Bilirubin 0.3 - 1.2 mg/dL 0.2   0.3   Alkaline Phos 38 - 126 U/L 64   91   AST 15 - 41 U/L 48   21   ALT 0 - 44 U/L 20   17      RADIOGRAPHIC STUDIES: I have personally reviewed the radiological images as listed and agreed with the findings in the report. CT Angio Chest PE W and/or Wo Contrast  Result Date: 02/06/2022 CLINICAL DATA:  Shortness of breath, history cardiomyopathy and coronary artery disease, hypoxia EXAM: CT ANGIOGRAPHY CHEST WITH CONTRAST TECHNIQUE:  Multidetector CT imaging of the chest was performed using the standard protocol during bolus administration of intravenous contrast. Multiplanar CT image reconstructions and MIPs were obtained to evaluate the vascular anatomy. RADIATION DOSE REDUCTION: This exam was performed according to the departmental dose-optimization program which includes automated exposure control, adjustment of the mA and/or kV according to patient size and/or use of iterative reconstruction technique. CONTRAST:  70mL OMNIPAQUE IOHEXOL 350 MG/ML SOLN COMPARISON:  02/06/2022 FINDINGS: Cardiovascular: This is a technically adequate evaluation of the pulmonary vasculature. There are no filling defects or pulmonary emboli. There is extrinsic narrowing of the left upper and left lower lobe pulmonary arteries due to the large mass described on previous CT imaging. The heart is enlarged without pericardial effusion. ICD unchanged. No evidence of thoracic aortic aneurysm. Diffuse atherosclerosis of the aorta and native coronary vasculature. Mediastinum/Nodes: Bulky mediastinal adenopathy is identified consistent with metastatic disease. Lymph node mass in the subcarinal station measures 4.4 cm reference image 68/4. Precarinal adenopathy measures up to 2.5 cm in short axis, reference image 37/4. Lymph node mass within the AP window measures up to 3.4 cm in short axis,  reference image 44/4. Right hilar adenopathy measures up to 2.6 cm in short axis reference image 81/4. Thyroid, trachea, and esophagus are grossly unremarkable. Lungs/Pleura: Large infiltrating left suprahilar mass is identified measuring at least 6.0 x 4.9 cm reference image 46/4. Postobstructive changes are seen within the left upper lobe, with left upper lobe interlobular septal thickening most consistent with lymphangitic spread of disease. There is a small left pleural effusion volume estimated less than 500 cc. Mild left apical pleural thickening is also noted. Compressive atelectasis left lower lobe. There is a subpleural right upper lobe nodule image 76/4 measuring 7 mm. Metastatic disease cannot be excluded. Remaining portions of the right chest are clear. Upper Abdomen: Numerous hypodensities are seen throughout the liver parenchyma consistent with metastatic disease. Index lesion left lobe liver image 128/4 measures 2.6 x 2.5 cm. Musculoskeletal: There are no acute or destructive bony lesions. Reconstructed images demonstrate no additional findings. Review of the MIP images confirms the above findings. IMPRESSION: 1. No evidence of pulmonary embolus. 2. Infiltrative left suprahilar mass consistent with malignancy. Postobstructive changes in the left upper lobe, as well as evidence of lymphangitic spread of disease. 3. Extensive mediastinal and bilateral hilar lymphadenopathy consistent with nodal metastases. 4. Extensive hepatic metastases. 5. Right middle lobe 7 mm nodule concerning for metastatic disease. 6. Small left pleural effusion and left apical pleural thickening. 7.  Aortic Atherosclerosis (ICD10-I70.0). Electronically Signed   By: Randa Ngo M.D.   On: 02/06/2022 16:19   CT Chest Wo Contrast  Result Date: 02/06/2022 CLINICAL DATA:  Persistent cough.  Shortness of breath. EXAM: CT CHEST WITHOUT CONTRAST TECHNIQUE: Multidetector CT imaging of the chest was performed following the standard  protocol without IV contrast. RADIATION DOSE REDUCTION: This exam was performed according to the departmental dose-optimization program which includes automated exposure control, adjustment of the mA and/or kV according to patient size and/or use of iterative reconstruction technique. COMPARISON:  Radiograph of same day.  CT scan November 11, 2009. FINDINGS: Cardiovascular: Status post aortic valve repair. Atherosclerosis of thoracic aorta is noted without aneurysm formation. Normal cardiac size. No pericardial effusion. Moderate coronary artery calcifications are noted. Mediastinum/Nodes: Thyroid gland is unremarkable. Esophagus is unremarkable. Extensive mediastinal adenopathy is now noted. Subcarinal adenopathy measures 5 cm in minor axis. Sense of adenopathy is noted in the anterior mediastinum and AP window, with the  largest lymph node measuring 2.5 cm. Extensive right paratracheal adenopathy is noted with largest lymph node measuring 2.4 cm. Lungs/Pleura: No pneumothorax is noted. Small left pleural effusion is noted with adjacent subsegmental atelectasis. Large irregular left upper lobe density is noted most consistent with neoplasm or malignancy and some degree of postobstructive atelectasis or pneumonia. 11 mm nodule is noted in lingular segment of left upper lobe. 8 mm right middle lobe nodule is noted. Upper Abdomen: Multiple rounded low densities are noted in the patent parenchyma consistent with metastatic disease. Musculoskeletal: No chest wall mass or suspicious bone lesions identified. IMPRESSION: Large irregular left upper lobe density is noted most consistent with neoplasm or malignancy and some degree of associated postobstructive atelectasis or pneumonia. Extensive mediastinal adenopathy is noted consistent with metastatic disease. Multiple hepatic metastases are noted. Pulmonary nodules are noted in the lingular segment of left upper lobe as well as in the right middle lobe concerning for metastatic  disease. Small left pleural effusion is noted with adjacent subsegmental atelectasis. Moderate coronary artery calcifications are noted. Aortic Atherosclerosis (ICD10-I70.0). Electronically Signed   By: Marijo Conception M.D.   On: 02/06/2022 15:00   DG Chest 2 View  Result Date: 02/06/2022 CLINICAL DATA:  Shortness of breath EXAM: CHEST - 2 VIEW COMPARISON:  11/29/2015 FINDINGS: Left-sided implanted cardiac device. Cardiomegaly with prior sternotomy and aortic valve replacement. Aortic atherosclerosis. Small left pleural effusion with streaky bibasilar and left upper lobe airspace opacities. No pneumothorax. IMPRESSION: Small left pleural effusion with streaky bibasilar and left upper lobe airspace opacities. Findings may reflect pulmonary edema versus multifocal infection. Electronically Signed   By: Davina Poke D.O.   On: 02/06/2022 14:04   CUP PACEART REMOTE DEVICE CHECK  Result Date: 12/19/2021 Scheduled remote reviewed. Normal device function.  Next remote 91 days- JJB    Assessment and plan-   #Acute respiratory failure, on nasal cannula oxygen 3 L Large left upper lobe density with extensive mediastinal adenopathy, pleural effusion, multiple liver lesions, Status post left thoracentesis, cytology pending. Imaging findings were reviewed and discussed with patient.  Concerning for progressive metastatic lung cancer, i.e. small cell lung cancer Cytology may or may not be diagnostic, often not enough for molecular testing.  Recommend patient to proceed with CT-guided liver biopsy for further evaluation.  Patient agrees with the plan. On Aspirin and Plavix. Consult IR Continue oxygen, wean as tolerated.recommend steroid to help breathing status Check for LFT PT, PTT   #Hyponatremia, euvolemic suspect SIADH #Chronic kidney disease, stable creatinine. Thank you for allowing me to participate in the care of this patient.   Earlie Server, MD, PhD Hematology Oncology 02/07/2022

## 2022-02-08 ENCOUNTER — Encounter: Payer: Self-pay | Admitting: *Deleted

## 2022-02-08 DIAGNOSIS — R0902 Hypoxemia: Secondary | ICD-10-CM | POA: Diagnosis not present

## 2022-02-08 DIAGNOSIS — R918 Other nonspecific abnormal finding of lung field: Secondary | ICD-10-CM

## 2022-02-08 LAB — PROTIME-INR
INR: 1 (ref 0.8–1.2)
Prothrombin Time: 13.1 seconds (ref 11.4–15.2)

## 2022-02-08 LAB — BASIC METABOLIC PANEL
Anion gap: 7 (ref 5–15)
BUN: 23 mg/dL (ref 8–23)
CO2: 23 mmol/L (ref 22–32)
Calcium: 9.5 mg/dL (ref 8.9–10.3)
Chloride: 99 mmol/L (ref 98–111)
Creatinine, Ser: 1.39 mg/dL — ABNORMAL HIGH (ref 0.61–1.24)
GFR, Estimated: 50 mL/min — ABNORMAL LOW (ref >60)
Glucose, Bld: 177 mg/dL — ABNORMAL HIGH (ref 70–99)
Potassium: 5 mmol/L (ref 3.5–5.1)
Sodium: 129 mmol/L — ABNORMAL LOW (ref 135–145)

## 2022-02-08 LAB — CBC
HCT: 38.1 % — ABNORMAL LOW (ref 39.0–52.0)
Hemoglobin: 13 g/dL (ref 13.0–17.0)
MCH: 30.2 pg (ref 26.0–34.0)
MCHC: 34.1 g/dL (ref 30.0–36.0)
MCV: 88.6 fL (ref 80.0–100.0)
Platelets: 181 10*3/uL (ref 150–400)
RBC: 4.3 MIL/uL (ref 4.22–5.81)
RDW: 14.1 % (ref 11.5–15.5)
WBC: 7.9 10*3/uL (ref 4.0–10.5)
nRBC: 0 % (ref 0.0–0.2)

## 2022-02-08 LAB — APTT: aPTT: 28 seconds (ref 24–36)

## 2022-02-08 LAB — MAGNESIUM: Magnesium: 1.9 mg/dL (ref 1.7–2.4)

## 2022-02-08 MED ORDER — PREDNISONE 20 MG PO TABS
40.0000 mg | ORAL_TABLET | Freq: Every day | ORAL | Status: DC
  Administered 2022-02-08 – 2022-02-09 (×2): 40 mg via ORAL
  Filled 2022-02-08 (×2): qty 2

## 2022-02-08 NOTE — Progress Notes (Signed)
IR received request for liver mass biopsy, imaging and case reviewed with Dr. Kathlene Cote who has approved the procedure under ultrasound guidance. The patient is on ASA and plavix with last dose being 8/30. We will need to hold both ASA and Plavix x 5 days prior to the biopsy. Per primary team the patient will be discharged soon and this biopsy will be done as an outpatient next week. IR scheduling team and RN Jocelyn Lamer made aware and plan for biopsy next Thursday. The patient will receive a pre-call Wednesday for instructions.   Hedy Jacob, PA-C 02/08/2022, 2:18 PM

## 2022-02-08 NOTE — Progress Notes (Signed)
Mobility Specialist - Progress Note   02/08/22 1400  Mobility  Activity Ambulated independently in hallway;Stood at bedside;Dangled on edge of bed  Level of Assistance Independent  Assistive Device None  Distance Ambulated (ft) 160 ft  Activity Response Tolerated well  $Mobility charge 1 Mobility   Pt supine in bed on 3L upon arrival. Pt STS and ambulates 1 lap around NS indep. Pt returns to bed with needs in reach and family in room.   Gretchen Short  Mobility Specialist  02/08/22 2:02 PM

## 2022-02-08 NOTE — Progress Notes (Signed)
Mobility Specialist - Progress Note   02/08/22 0952  Mobility  Activity Stood at bedside;Ambulated independently in hallway  Level of Assistance Independent  Assistive Device None  Distance Ambulated (ft) 160 ft  Activity Response Tolerated well  $Mobility charge 1 Mobility   Pt OOB on 3L upon arrival. Pt ambulates 1 lap around NS indep. Pt left EOB with needs in reach and visitors in room.   Gretchen Short  Mobility Specialist  02/08/22 9:53 AM

## 2022-02-08 NOTE — Social Work (Signed)
CSW notified pt qualifies for 3L home oxygen. Verified orders and walking sat in place. CSW notified [employer], and will follow up to ensure it is delivered at DC. TOC will continue to follow for DC needs.

## 2022-02-08 NOTE — Progress Notes (Signed)
SATURATION QUALIFICATIONS: (This note is used to comply with regulatory documentation for home oxygen)  Patient Saturations on Room Air at Rest = 92%  Patient Saturations on Room Air while Ambulating = 81%  Patient Saturations on 3 Liters of oxygen while Ambulating = 90%  Please briefly explain why patient needs home oxygen:

## 2022-02-08 NOTE — Progress Notes (Signed)
  PROGRESS NOTE    Ralph Drake  SKA:[id number] DOB: 10-Dec-1934 DOA: 02/06/2022 PCP: [name], MD  123A/123A-AA  LOS: 2 days   Brief hospital course: No notes on file  Assessment & Plan: Ralph Drake is a 87 y.o. male with medical history significant of CAD status post PCI, VT status post CAD and PPM, HTN, chronic HFpEF secondary to ischemic cardiomyopathy, HLD, CKD stage IIIb, PVD on Plavix, presented with worsening of wheezing and shortness of breath.   Acute hypoxic respiratory Failure --in the ED, Pt O2 at 85 % RA.  pt placed on 2L with no improvement, pt increased to 3L and now at 90%. --Likely secondary to lung CA, appears to be metastatic to right lung and liver at least, especially obstructing at least left upper lobe which causing the fixed wheezing.  No significant postobstructive pneumonia on CT chest or CT angiogram.  Noted that patient also completed 2 weeks of antibiotics and prednisone with little help of his breathing symptoms.  Abx not continued. Plan: --ambulation test today showed pt needs 3L while walking, will discharge on home O2.  Lung mass with liver mets --oncology consulted, with Dr. Tasia Drake --hold ASA and plavix --outpatient US-guided liver biopsy to be done on next Thursday 8:30am.  Pleural left pleural effusion --s/p thoracentesis on 8/30 with 500 ml removed --f/u fluid studies  Acute hyponatremia -Euvolemic, suspect SIADH, hyponatremia lab pending.   HTN --cont amlodipine and Lisinopril   CKD stage IIIb -Euvolemic, creatinine level stable.   History of VT -continue amiodarone.   PVD -Continue statin and pentoxifylline --hold home ASA and plavix due to upcoming liver biopsy   Chronic HFpEF -Euvolemic   DVT prophylaxis: Heparin SQ Code Status: Full code  Family Communication: wife and sister-in-law updated at bedside today Level of care: Med-Surg Dispo:   The patient is from: home Anticipated d/c is to: home Anticipated d/c date  is: 1-2 days   Subjective and Interval History:  Pt walked today, but needed 3L O2.     Objective: Vitals:   02/08/22 0826 02/08/22 0909 02/08/22 0911 02/08/22 1334  BP:  113/89 113/89   Pulse:   81   Resp:   18   Temp:   98.4 F (36.9 C)   TempSrc:   Oral   SpO2: 92%  93% 94%  Weight:      Height:        Intake/Output Summary (Last 24 hours) at 02/08/2022 1533 Last data filed at 02/08/2022 1300 Gross per 24 hour  Intake 240 ml  Output 702 ml  Net -462 ml   Filed Weights   02/06/22 1733  Weight: 61.2 kg    Examination:   Constitutional: NAD, AAOx3, sitting at edge of bed HEENT: conjunctivae and lids normal, EOMI CV: No cyanosis.   RESP: no more wheezes or rhonchi, on 3L SKIN: warm, dry Neuro: II - XII grossly intact.   Psych: Normal mood and affect.  Appropriate judgement and reason   Data Reviewed: I have personally reviewed labs and imaging studies  Time spent: 50 minutes  Enzo Bi, MD [facility] If 7PM-7AM, please contact night-coverage 02/08/2022, 3:33 PM

## 2022-02-09 DIAGNOSIS — J9601 Acute respiratory failure with hypoxia: Secondary | ICD-10-CM | POA: Diagnosis not present

## 2022-02-09 DIAGNOSIS — J90 Pleural effusion, not elsewhere classified: Secondary | ICD-10-CM

## 2022-02-09 DIAGNOSIS — R0902 Hypoxemia: Secondary | ICD-10-CM | POA: Diagnosis not present

## 2022-02-09 DIAGNOSIS — E871 Hypo-osmolality and hyponatremia: Secondary | ICD-10-CM | POA: Diagnosis not present

## 2022-02-09 DIAGNOSIS — R918 Other nonspecific abnormal finding of lung field: Secondary | ICD-10-CM | POA: Diagnosis not present

## 2022-02-09 LAB — BASIC METABOLIC PANEL
Anion gap: 5 (ref 5–15)
BUN: 26 mg/dL — ABNORMAL HIGH (ref 8–23)
CO2: 26 mmol/L (ref 22–32)
Calcium: 9.6 mg/dL (ref 8.9–10.3)
Chloride: 98 mmol/L (ref 98–111)
Creatinine, Ser: 1.39 mg/dL — ABNORMAL HIGH (ref 0.61–1.24)
GFR, Estimated: 50 mL/min — ABNORMAL LOW (ref >60)
Glucose, Bld: 118 mg/dL — ABNORMAL HIGH (ref 70–99)
Potassium: 4.7 mmol/L (ref 3.5–5.1)
Sodium: 129 mmol/L — ABNORMAL LOW (ref 135–145)

## 2022-02-09 LAB — CBC
HCT: 37.6 % — ABNORMAL LOW (ref 39.0–52.0)
Hemoglobin: 12.6 g/dL — ABNORMAL LOW (ref 13.0–17.0)
MCH: 29.6 pg (ref 26.0–34.0)
MCHC: 33.5 g/dL (ref 30.0–36.0)
MCV: 88.3 fL (ref 80.0–100.0)
Platelets: 196 10*3/uL (ref 150–400)
RBC: 4.26 MIL/uL (ref 4.22–5.81)
RDW: 14 % (ref 11.5–15.5)
WBC: 12.5 10*3/uL — ABNORMAL HIGH (ref 4.0–10.5)
nRBC: 0 % (ref 0.0–0.2)

## 2022-02-09 LAB — MAGNESIUM: Magnesium: 2.1 mg/dL (ref 1.7–2.4)

## 2022-02-09 MED ORDER — ASPIRIN 81 MG PO TBEC: DELAYED_RELEASE_TABLET | ORAL | 12 refills | Status: AC

## 2022-02-09 MED ORDER — CLOPIDOGREL BISULFATE 75 MG PO TABS: ORAL_TABLET | ORAL | 3 refills | Status: DC

## 2022-02-09 MED ORDER — IPRATROPIUM-ALBUTEROL 20-100 MCG/ACT IN AERS
1.0000 | INHALATION_SPRAY | Freq: Four times a day (QID) | RESPIRATORY_TRACT | Status: DC
  Administered 2022-02-09: 1 via RESPIRATORY_TRACT
  Filled 2022-02-09: qty 4

## 2022-02-09 MED ORDER — PREDNISONE 20 MG PO TABS: 40.0000 mg | ORAL_TABLET | Freq: Every day | ORAL | 0 refills | Status: AC

## 2022-02-09 MED ORDER — AMIODARONE HCL 200 MG PO TABS
200.0000 mg | ORAL_TABLET | Freq: Every day | ORAL | Status: AC
Start: 2022-02-09 — End: ?

## 2022-02-09 MED ORDER — IPRATROPIUM-ALBUTEROL 20-100 MCG/ACT IN AERS: 1.0000 | INHALATION_SPRAY | Freq: Four times a day (QID) | RESPIRATORY_TRACT | Status: AC | PRN

## 2022-02-09 NOTE — Plan of Care (Signed)

## 2022-02-09 NOTE — Progress Notes (Signed)
Patient is presently in hospital,plan for discharge this afternoon, for liver biopsy per Dr Tasia Catchings request, holding ASA/Plavix until biopsy 9/7, went to room with family present with patient. Pre procedure instructions given. Made aware to be here @0830 , NPO after MN prior to procedure and driver post procedure/recovery/discharge. Stated understanding.

## 2022-02-09 NOTE — Progress Notes (Signed)
Hematology/Oncology Progress note [phone] [phone]     Patient Care Team: [name], MD as PCP - General (Family Medicine) Leonie Man, MD as PCP - Cardiology (Cardiology) Evans Lance, MD as PCP - Electrophysiology (Cardiology) Grace Isaac, MD (Inactive) as Consulting Physician (Cardiothoracic Surgery) Leonie Man, MD as Consulting Physician (Cardiology) Gean Quint, MD as Consulting Physician (Nephrology) Telford Nab, RN as Oncology Nurse Navigator   Name of the patient: Ralph Drake  [id number]  07-13-1934  Date of visit: 02/09/22   INTERVAL HISTORY-   Patient reports feeling well.  Breathing status has improved.  Patient will be discharged home today if his oxygen.  Wife and brother are at the bedside.    No Known Allergies  Patient Active Problem List   Diagnosis Date Noted   Lung mass    Hyponatremia    Liver lesion    Acute respiratory failure with hypoxia ([place]) 02/06/2022   Hypoxia 02/06/2022   Long term current use of amiodarone 50/27/7412   Chronic systolic heart failure ([place]) 08/07/2019   ICD (implantable cardioverter-defibrillator) in place 08/07/2019   Claudication in peripheral vascular disease ([place]) 03/23/2019   Fatigue due to treatment 10/06/2016   V-tach [facility])    Sustained ventricular tachycardia ([place]) 11/08/2015   Bilateral claudication of lower limb ([place]) 10/01/2015   Dizziness 02/09/2015   Moderate claudication ([place]) 02/09/2015   Carotid bruit 08/12/2014   Stenosis of iliac artery ([facility]) 11/09/2013   Pain in limb-Left > Right Leg 11/09/2013   Exertional dyspnea 10/01/2013   Postoperative atrial fibrillation ([place]) 02/15/2013   RBBB 02/13/2013   CAD S/P PTCA of CFX X 2 in '93    Dyslipidemia, goal LDL below 70    S/P tissue AVR 02/11/13 02/10/2013   Essential hypertension    GERD (gastroesophageal reflux disease)    Arthritis    Olecranon bursitis of left elbow    Trigger ring finger of  left hand    Atherosclerotic peripheral vascular disease with intermittent claudication ([place]) 02/11/2002      Physical exam:  Vitals:   02/09/22 0221 02/09/22 0606 02/09/22 0757 02/09/22 0821  BP:  136/67  (!) 158/70  Pulse:  70  75  Resp:  16  (!) 22  Temp:  97.8 F (36.6 C)  97.7 F (36.5 C)  TempSrc:      SpO2: 92% 94% 93% 93%  Weight:      Height:       Physical Exam Constitutional:      General: He is not in acute distress.    Appearance: He is not diaphoretic.  HENT:     Head: Normocephalic.  Eyes:     General: No scleral icterus.    Pupils: Pupils are equal, round, and reactive to light.  Cardiovascular:     Rate and Rhythm: Normal rate and regular rhythm.     Heart sounds: No murmur heard. Pulmonary:     Effort: Pulmonary effort is normal. No respiratory distress.     Breath sounds: No wheezing.     Comments: Patient breathes comfortably on oxygen. Abdominal:     General: There is no distension.     Palpations: Abdomen is soft.  Musculoskeletal:        General: Normal range of motion.     Cervical back: Normal range of motion.  Skin:    General: Skin is warm and dry.     Findings: No erythema.  Neurological:     Mental  Status: He is alert and oriented to person, place, and time. Mental status is at baseline.     Motor: No abnormal muscle tone.  Psychiatric:        Mood and Affect: Mood and affect normal.           Latest Ref Rng & Units 02/09/2022    5:24 AM  CMP  Glucose 70 - 99 mg/dL 118   BUN 8 - 23 mg/dL 26   Creatinine 0.61 - 1.24 mg/dL 1.39   Sodium 135 - 145 mmol/L 129   Potassium 3.5 - 5.1 mmol/L 4.7   Chloride 98 - 111 mmol/L 98   CO2 22 - 32 mmol/L 26   Calcium 8.9 - 10.3 mg/dL 9.6       Latest Ref Rng & Units 02/09/2022    5:24 AM  CBC  WBC 4.0 - 10.5 K/uL 12.5   Hemoglobin 13.0 - 17.0 g/dL 12.6   Hematocrit 39.0 - 52.0 % 37.6   Platelets 150 - 400 K/uL 196     RADIOGRAPHIC STUDIES: I have personally reviewed the  radiological images as listed and agreed with the findings in the report. DG Chest Port 1 View  Result Date: 02/07/2022 CLINICAL DATA:  Status post left thoracentesis. EXAM: PORTABLE CHEST 1 VIEW COMPARISON:  February 06, 2022. FINDINGS: No pneumothorax is noted status post left-sided thoracentesis. Left pleural effusion is significantly smaller. IMPRESSION: No pneumothorax status post left-sided thoracentesis. Electronically Signed   By: Marijo Conception M.D.   On: 02/07/2022 12:35   US THORACENTESIS ASP PLEURAL SPACE W/IMG GUIDE  Result Date: 02/07/2022 INDICATION: Patient with history of CKD stage 3, HFpEF complaining of wheezing with shortness of breath found to be hypoxic. Patient CT showed lung mass and left pleural effusion. Request for diagnostic and therapeutic left thoracentesis. EXAM: ULTRASOUND GUIDED LEFT THORACENTESIS MEDICATIONS: 10 mL 1 % lidocaine COMPLICATIONS: None immediate. PROCEDURE: An ultrasound guided thoracentesis was thoroughly discussed with the patient and questions answered. The benefits, risks, alternatives and complications were also discussed. The patient understands and wishes to proceed with the procedure. Written consent was obtained. Ultrasound was performed to localize and mark an adequate pocket of fluid in the left chest. The area was then prepped and draped in the normal sterile fashion. 1% Lidocaine was used for local anesthesia. Under ultrasound guidance a 6 Fr Safe-T-Centesis catheter was introduced. Thoracentesis was performed. The catheter was removed and a dressing applied. FINDINGS: A total of approximately 500 cc of serosanguineous fluid was removed. Samples were sent to the laboratory as requested by the clinical team. IMPRESSION: Successful ultrasound guided left thoracentesis yielding 500 cc of pleural fluid. Read by: Narda Rutherford, AGNP-BC Electronically Signed   By: Ruthann Cancer M.D.   On: 02/07/2022 12:34   CT Angio Chest PE W and/or Wo Contrast  Result  Date: 02/06/2022 CLINICAL DATA:  Shortness of breath, history cardiomyopathy and coronary artery disease, hypoxia EXAM: CT ANGIOGRAPHY CHEST WITH CONTRAST TECHNIQUE: Multidetector CT imaging of the chest was performed using the standard protocol during bolus administration of intravenous contrast. Multiplanar CT image reconstructions and MIPs were obtained to evaluate the vascular anatomy. RADIATION DOSE REDUCTION: This exam was performed according to the departmental dose-optimization program which includes automated exposure control, adjustment of the mA and/or kV according to patient size and/or use of iterative reconstruction technique. CONTRAST:  49mL OMNIPAQUE IOHEXOL 350 MG/ML SOLN COMPARISON:  02/06/2022 FINDINGS: Cardiovascular: This is a technically adequate evaluation of the pulmonary vasculature. There are  no filling defects or pulmonary emboli. There is extrinsic narrowing of the left upper and left lower lobe pulmonary arteries due to the large mass described on previous CT imaging. The heart is enlarged without pericardial effusion. ICD unchanged. No evidence of thoracic aortic aneurysm. Diffuse atherosclerosis of the aorta and native coronary vasculature. Mediastinum/Nodes: Bulky mediastinal adenopathy is identified consistent with metastatic disease. Lymph node mass in the subcarinal station measures 4.4 cm reference image 68/4. Precarinal adenopathy measures up to 2.5 cm in short axis, reference image 37/4. Lymph node mass within the AP window measures up to 3.4 cm in short axis, reference image 44/4. Right hilar adenopathy measures up to 2.6 cm in short axis reference image 81/4. Thyroid, trachea, and esophagus are grossly unremarkable. Lungs/Pleura: Large infiltrating left suprahilar mass is identified measuring at least 6.0 x 4.9 cm reference image 46/4. Postobstructive changes are seen within the left upper lobe, with left upper lobe interlobular septal thickening most consistent with  lymphangitic spread of disease. There is a small left pleural effusion volume estimated less than 500 cc. Mild left apical pleural thickening is also noted. Compressive atelectasis left lower lobe. There is a subpleural right upper lobe nodule image 76/4 measuring 7 mm. Metastatic disease cannot be excluded. Remaining portions of the right chest are clear. Upper Abdomen: Numerous hypodensities are seen throughout the liver parenchyma consistent with metastatic disease. Index lesion left lobe liver image 128/4 measures 2.6 x 2.5 cm. Musculoskeletal: There are no acute or destructive bony lesions. Reconstructed images demonstrate no additional findings. Review of the MIP images confirms the above findings. IMPRESSION: 1. No evidence of pulmonary embolus. 2. Infiltrative left suprahilar mass consistent with malignancy. Postobstructive changes in the left upper lobe, as well as evidence of lymphangitic spread of disease. 3. Extensive mediastinal and bilateral hilar lymphadenopathy consistent with nodal metastases. 4. Extensive hepatic metastases. 5. Right middle lobe 7 mm nodule concerning for metastatic disease. 6. Small left pleural effusion and left apical pleural thickening. 7.  Aortic Atherosclerosis (ICD10-I70.0). Electronically Signed   By: Randa Ngo M.D.   On: 02/06/2022 16:19   CT Chest Wo Contrast  Result Date: 02/06/2022 CLINICAL DATA:  Persistent cough.  Shortness of breath. EXAM: CT CHEST WITHOUT CONTRAST TECHNIQUE: Multidetector CT imaging of the chest was performed following the standard protocol without IV contrast. RADIATION DOSE REDUCTION: This exam was performed according to the departmental dose-optimization program which includes automated exposure control, adjustment of the mA and/or kV according to patient size and/or use of iterative reconstruction technique. COMPARISON:  Radiograph of same day.  CT scan November 11, 2009. FINDINGS: Cardiovascular: Status post aortic valve repair.  Atherosclerosis of thoracic aorta is noted without aneurysm formation. Normal cardiac size. No pericardial effusion. Moderate coronary artery calcifications are noted. Mediastinum/Nodes: Thyroid gland is unremarkable. Esophagus is unremarkable. Extensive mediastinal adenopathy is now noted. Subcarinal adenopathy measures 5 cm in minor axis. Sense of adenopathy is noted in the anterior mediastinum and AP window, with the largest lymph node measuring 2.5 cm. Extensive right paratracheal adenopathy is noted with largest lymph node measuring 2.4 cm. Lungs/Pleura: No pneumothorax is noted. Small left pleural effusion is noted with adjacent subsegmental atelectasis. Large irregular left upper lobe density is noted most consistent with neoplasm or malignancy and some degree of postobstructive atelectasis or pneumonia. 11 mm nodule is noted in lingular segment of left upper lobe. 8 mm right middle lobe nodule is noted. Upper Abdomen: Multiple rounded low densities are noted in the patent parenchyma consistent with  metastatic disease. Musculoskeletal: No chest wall mass or suspicious bone lesions identified. IMPRESSION: Large irregular left upper lobe density is noted most consistent with neoplasm or malignancy and some degree of associated postobstructive atelectasis or pneumonia. Extensive mediastinal adenopathy is noted consistent with metastatic disease. Multiple hepatic metastases are noted. Pulmonary nodules are noted in the lingular segment of left upper lobe as well as in the right middle lobe concerning for metastatic disease. Small left pleural effusion is noted with adjacent subsegmental atelectasis. Moderate coronary artery calcifications are noted. Aortic Atherosclerosis (ICD10-I70.0). Electronically Signed   By: Marijo Conception M.D.   On: 02/06/2022 15:00   DG Chest 2 View  Result Date: 02/06/2022 CLINICAL DATA:  Shortness of breath EXAM: CHEST - 2 VIEW COMPARISON:  11/29/2015 FINDINGS: Left-sided implanted  cardiac device. Cardiomegaly with prior sternotomy and aortic valve replacement. Aortic atherosclerosis. Small left pleural effusion with streaky bibasilar and left upper lobe airspace opacities. No pneumothorax. IMPRESSION: Small left pleural effusion with streaky bibasilar and left upper lobe airspace opacities. Findings may reflect pulmonary edema versus multifocal infection. Electronically Signed   By: Davina Poke D.O.   On: 02/06/2022 14:04    Assessment and plan-   #Acute respiratory failure, on nasal cannula oxygen 3 L Large left upper lobe density with extensive mediastinal adenopathy, pleural effusion, multiple liver lesions, Status post left thoracentesis, cytology is pending. Imaging findings were reviewed and discussed with patient.  Concerning for progressive metastatic lung cancer, i.e. small cell lung cancer Cytology may or may not be diagnostic, often not enough for molecular testing.  Recommend patient to proceed with liver biopsy.  Patient is holding aspirin Plavix and outpatient IR liver biopsy next week.   Further staging imaging :PET scan outpatient.Brain imaging [MRI brain or CT if defibrillator is not compatible with MRI] I discussed with patient's sister-in-law Rise Paganini over the phone and answered all the questions. Today also discussed and answered family's questions Patient will follow-up outpatient with me a few days after biopsy to review pathology and treatment plan.  COPD exacerbation, steroid taper, bronchodilators.  Thank you for allowing me to participate in the care of this patient.   Earlie Server, MD, PhD Hematology Oncology 02/09/2022

## 2022-02-09 NOTE — Care Management Important Message (Signed)
Important Message  Patient Details  Name: Ralph Drake MRN: [id number] Date of Birth: 12/10/1934   Medicare Important Message Given:  N/A - LOS <3 / Initial given by admissions     Juliann Pulse A [name] 02/09/2022, 8:58 AM

## 2022-02-09 NOTE — Progress Notes (Signed)
Pt found to have lung mass and adenopathy during recent hospitalization. Per Dr. Tasia Catchings, pt needs assistance with coordinating appts for brain MRI, PET scan, and outpatient follow up. Insurance authorization not required for imaging. Will place orders and schedule. Once scheduled, pt will be notified with appts.

## 2022-02-09 NOTE — Addendum Note (Signed)
Addended by: Telford Nab on: 02/09/2022 07:31 AM   Modules accepted: Orders

## 2022-02-09 NOTE — Progress Notes (Signed)
Pt is independent in room and refuses bed alarm

## 2022-02-09 NOTE — TOC Progression Note (Addendum)
Transition of Care [facility]) - Progression Note    Patient Details  Name: Ralph Drake MRN: [id number] Date of Birth: [birth year]  [facility] CM/SW Contact  Coralee [place], [place] Phone Number: 02/09/2022, 11:19 AM  Clinical Narrative:    Update: [employer] noted O2 has been ordered and will be delivered but has not currently been set up in the home. CSW met with spouse and pt at bedside. They have a family member to be at the house for delivery. They will call pt or CSW when enroute. They will send up transport O2 to the room for pt to DC with. TOC will continue to follow.  CSW received confirmation home O2 has been set up through [employer]. VM left for spouse to follow up on other discharge needs. TOC is available for any further needs.       Expected Discharge Plan and Services           Expected Discharge Date: 02/09/22                                     Social Determinants of Health (SDOH) Interventions    Readmission Risk Interventions     No data to display

## 2022-02-09 NOTE — Discharge Summary (Signed)
Physician Discharge Summary   Ralph Drake  male DOB: [birth year]  FWY:[id number]  PCP: [name], MD  Admit date: 02/06/2022 Discharge date: 02/09/2022  Admitted From: home Disposition:  home CODE STATUS: Full code  Discharge Instructions     Discharge instructions   Complete by: As directed    You have been treated for COPD flare up.  Please take 2 more days of prednisone starting tomorrow, and use your inhaler (combivent) every 6 hours as needed at home.  You will go home with 3 liters of supplemental oxygen.  Your liver biopsy is scheduled for Thursday 02/15/22 at 8:30 am.  Please hold your aspirin and plavix until then.  IR doctor should tell you when to resume them.   Dr. Enzo Bi - -   No wound care   Complete by: As directed       Hospital Course:  For full details, please see H&P, progress notes, consult notes and ancillary notes.  Briefly,  Ralph Drake is a 87 y.o. male with medical history significant of CAD status post PCI, VT status post CAD and PPM, HTN, chronic HFpEF secondary to ischemic cardiomyopathy, CKD stage IIIb, PVD on Plavix, presented with worsening of wheezing and shortness of breath.   Acute hypoxic respiratory Failure --in the ED, Pt O2 at 85 % RA.  pt placed on 3L. --Likely secondary to lung CA, with lymphangitic spread, and Postobstructive changes in the left upper lobe.  Also likely has undiagnosed COPD pt had significant wheezing that improved with treatment with steroid and bronchodilators.   --Noted that patient also completed 2 weeks of antibiotics with little help of his breathing symptoms.  Abx not continued. --ambulation test showed pt needs 3L while walking, so was discharged with home O2.   Lung mass with liver mets --CT showed Infiltrative left suprahilar mass, lymphangitic spread of disease, Extensive mediastinal and bilateral hilar lymphadenopathy, and Extensive hepatic metastases. --oncology consulted, with Dr.  Tasia Catchings --home ASA and plavix were held and pt will have outpatient US-guided liver biopsy on 02/15/22 at 8:30 am. --outpatient f/u with Dr. Tasia Catchings   COPD exacerbation Also likely has undiagnosed COPD pt had significant wheezing that improved with treatment with steroid and bronchodilators.   --Pt received IV solumedrol 40 mg f/b prednisone 40 mg daily, and will finish 2 more days of prednisone after discharge. --pt received DuoNeb during hospitalization, however, pt did not want to give himself DuoNeb at home, therefore, Combivent ordered.  Left pleural effusion --s/p thoracentesis on 8/30 with 500 ml removed --f/u fluid studies   Acute hyponatremia -Euvolemic, suspect SIADH   HTN --cont amlodipine and Lisinopril   CKD stage IIIb --stable   History of VT -continue amiodarone.   PVD --Continue statin and pentoxifylline --hold home ASA and plavix due to upcoming liver biopsy.     Chronic HFpEF -Euvolemic   Discharge Diagnoses:  Principal Problem:   Hypoxia Active Problems:   Acute respiratory failure with hypoxia (HCC)   Lung mass   Hyponatremia   Liver lesion   30 Day Unplanned Readmission Risk Score    Flowsheet Row ED to Hosp-Admission (Current) from 02/06/2022 in [place]  30 Day Unplanned Readmission Risk Score (%) 14.84 Filed at 02/09/2022 0801       This score is the patient's risk of an unplanned readmission within 30 days of being discharged (0 -100%). The score is based on dignosis, age, lab data, medications, orders, and past  utilization.   Low:  0-14.9   Medium: 15-21.9   High: 22-29.9   Extreme: 30 and above         Discharge Instructions:  Allergies as of 02/09/2022   No Known Allergies      Medication List     TAKE these medications    acetaminophen 500 MG tablet Commonly known as: TYLENOL Take 500 mg by mouth every 6 (six) hours as needed for moderate pain or headache.   amiodarone 200 MG  tablet Commonly known as: PACERONE Take 1 tablet (200 mg total) by mouth daily. Home med. What changed: See the new instructions.   amLODipine 10 MG tablet Commonly known as: NORVASC TAKE 1 TABLET BY MOUTH  DAILY   aspirin EC 81 MG tablet Hold until IR biopsy and follow IR instruction on when to resume. What changed:  how much to take how to take this when to take this additional instructions   atorvastatin 80 MG tablet Commonly known as: LIPITOR TAKE 1 TABLET BY MOUTH  DAILY AT 6 PM   clopidogrel 75 MG tablet Commonly known as: PLAVIX Hold until IR biopsy and follow IR instruction on when to resume. What changed:  how much to take how to take this when to take this additional instructions   ezetimibe 10 MG tablet Commonly known as: ZETIA TAKE 1 TABLET BY MOUTH  DAILY   fish oil-omega-3 fatty acids 1000 MG capsule Take 1 g by mouth daily.   Ipratropium-Albuterol 20-100 MCG/ACT Aers respimat Commonly known as: COMBIVENT Inhale 1 puff into the lungs every 6 (six) hours as needed for wheezing.   levothyroxine 50 MCG tablet Commonly known as: SYNTHROID Take 50 mcg by mouth daily before breakfast.   lisinopril 10 MG tablet Commonly known as: ZESTRIL TAKE 1 TABLET BY MOUTH  DAILY   loratadine 10 MG tablet Commonly known as: CLARITIN Take 10 mg by mouth daily as needed for allergies.   multivitamin with minerals tablet Take 1 tablet by mouth daily.   pentoxifylline 400 MG CR tablet Commonly known as: TRENTAL TAKE 1 TABLET BY MOUTH  TWICE DAILY WITH MEALS   predniSONE 20 MG tablet Commonly known as: DELTASONE Take 2 tablets (40 mg total) by mouth daily with breakfast for 2 days. Start taking on: February 10, 2022   vitamin E 180 MG (400 UNITS) capsule Take 2 capsules by mouth daily.               Durable Medical Equipment  (From admission, onward)           Start     Ordered   02/08/22 1458  For home use only DME oxygen  Once       Question  Answer Comment  Length of Need 6 Months   Mode or (Route) Nasal cannula   Liters per Minute 3   Frequency Continuous (stationary and portable oxygen unit needed)   Oxygen delivery system Gas      02/08/22 1458             Follow-up Information     [name], MD Follow up in 1 week(s).   Specialty: Family Medicine Contact information: [address] [place] [address] [phone]         Earlie Server, MD Follow up.   Specialty: Oncology Contact information: [address] [place] [address] [phone]  No Known Allergies   The results of significant diagnostics from this hospitalization (including imaging, microbiology, ancillary and laboratory) are listed below for reference.   Consultations:   Procedures/Studies: DG Chest Port 1 View  Result Date: 02/07/2022 CLINICAL DATA:  Status post left thoracentesis. EXAM: PORTABLE CHEST 1 VIEW COMPARISON:  February 06, 2022. FINDINGS: No pneumothorax is noted status post left-sided thoracentesis. Left pleural effusion is significantly smaller. IMPRESSION: No pneumothorax status post left-sided thoracentesis. Electronically Signed   By: Marijo Conception M.D.   On: 02/07/2022 12:35   US THORACENTESIS ASP PLEURAL SPACE W/IMG GUIDE  Result Date: 02/07/2022 INDICATION: Patient with history of CKD stage 3, HFpEF complaining of wheezing with shortness of breath found to be hypoxic. Patient CT showed lung mass and left pleural effusion. Request for diagnostic and therapeutic left thoracentesis. EXAM: ULTRASOUND GUIDED LEFT THORACENTESIS MEDICATIONS: 10 mL 1 % lidocaine COMPLICATIONS: None immediate. PROCEDURE: An ultrasound guided thoracentesis was thoroughly discussed with the patient and questions answered. The benefits, risks, alternatives and complications were also discussed. The patient understands and wishes to proceed with the procedure. Written consent was obtained.  Ultrasound was performed to localize and mark an adequate pocket of fluid in the left chest. The area was then prepped and draped in the normal sterile fashion. 1% Lidocaine was used for local anesthesia. Under ultrasound guidance a 6 Fr Safe-T-Centesis catheter was introduced. Thoracentesis was performed. The catheter was removed and a dressing applied. FINDINGS: A total of approximately 500 cc of serosanguineous fluid was removed. Samples were sent to the laboratory as requested by the clinical team. IMPRESSION: Successful ultrasound guided left thoracentesis yielding 500 cc of pleural fluid. Read by: Narda Rutherford, AGNP-BC Electronically Signed   By: Ruthann Cancer M.D.   On: 02/07/2022 12:34   CT Angio Chest PE W and/or Wo Contrast  Result Date: 02/06/2022 CLINICAL DATA:  Shortness of breath, history cardiomyopathy and coronary artery disease, hypoxia EXAM: CT ANGIOGRAPHY CHEST WITH CONTRAST TECHNIQUE: Multidetector CT imaging of the chest was performed using the standard protocol during bolus administration of intravenous contrast. Multiplanar CT image reconstructions and MIPs were obtained to evaluate the vascular anatomy. RADIATION DOSE REDUCTION: This exam was performed according to the departmental dose-optimization program which includes automated exposure control, adjustment of the mA and/or kV according to patient size and/or use of iterative reconstruction technique. CONTRAST:  24mL OMNIPAQUE IOHEXOL 350 MG/ML SOLN COMPARISON:  02/06/2022 FINDINGS: Cardiovascular: This is a technically adequate evaluation of the pulmonary vasculature. There are no filling defects or pulmonary emboli. There is extrinsic narrowing of the left upper and left lower lobe pulmonary arteries due to the large mass described on previous CT imaging. The heart is enlarged without pericardial effusion. ICD unchanged. No evidence of thoracic aortic aneurysm. Diffuse atherosclerosis of the aorta and native coronary vasculature.  Mediastinum/Nodes: Bulky mediastinal adenopathy is identified consistent with metastatic disease. Lymph node mass in the subcarinal station measures 4.4 cm reference image 68/4. Precarinal adenopathy measures up to 2.5 cm in short axis, reference image 37/4. Lymph node mass within the AP window measures up to 3.4 cm in short axis, reference image 44/4. Right hilar adenopathy measures up to 2.6 cm in short axis reference image 81/4. Thyroid, trachea, and esophagus are grossly unremarkable. Lungs/Pleura: Large infiltrating left suprahilar mass is identified measuring at least 6.0 x 4.9 cm reference image 46/4. Postobstructive changes are seen within the left upper lobe, with left upper lobe interlobular septal thickening most consistent with lymphangitic spread of disease.  There is a small left pleural effusion volume estimated less than 500 cc. Mild left apical pleural thickening is also noted. Compressive atelectasis left lower lobe. There is a subpleural right upper lobe nodule image 76/4 measuring 7 mm. Metastatic disease cannot be excluded. Remaining portions of the right chest are clear. Upper Abdomen: Numerous hypodensities are seen throughout the liver parenchyma consistent with metastatic disease. Index lesion left lobe liver image 128/4 measures 2.6 x 2.5 cm. Musculoskeletal: There are no acute or destructive bony lesions. Reconstructed images demonstrate no additional findings. Review of the MIP images confirms the above findings. IMPRESSION: 1. No evidence of pulmonary embolus. 2. Infiltrative left suprahilar mass consistent with malignancy. Postobstructive changes in the left upper lobe, as well as evidence of lymphangitic spread of disease. 3. Extensive mediastinal and bilateral hilar lymphadenopathy consistent with nodal metastases. 4. Extensive hepatic metastases. 5. Right middle lobe 7 mm nodule concerning for metastatic disease. 6. Small left pleural effusion and left apical pleural thickening. 7.   Aortic Atherosclerosis (ICD10-I70.0). Electronically Signed   By: Randa Ngo M.D.   On: 02/06/2022 16:19   CT Chest Wo Contrast  Result Date: 02/06/2022 CLINICAL DATA:  Persistent cough.  Shortness of breath. EXAM: CT CHEST WITHOUT CONTRAST TECHNIQUE: Multidetector CT imaging of the chest was performed following the standard protocol without IV contrast. RADIATION DOSE REDUCTION: This exam was performed according to the departmental dose-optimization program which includes automated exposure control, adjustment of the mA and/or kV according to patient size and/or use of iterative reconstruction technique. COMPARISON:  Radiograph of same day.  CT scan November 11, 2009. FINDINGS: Cardiovascular: Status post aortic valve repair. Atherosclerosis of thoracic aorta is noted without aneurysm formation. Normal cardiac size. No pericardial effusion. Moderate coronary artery calcifications are noted. Mediastinum/Nodes: Thyroid gland is unremarkable. Esophagus is unremarkable. Extensive mediastinal adenopathy is now noted. Subcarinal adenopathy measures 5 cm in minor axis. Sense of adenopathy is noted in the anterior mediastinum and AP window, with the largest lymph node measuring 2.5 cm. Extensive right paratracheal adenopathy is noted with largest lymph node measuring 2.4 cm. Lungs/Pleura: No pneumothorax is noted. Small left pleural effusion is noted with adjacent subsegmental atelectasis. Large irregular left upper lobe density is noted most consistent with neoplasm or malignancy and some degree of postobstructive atelectasis or pneumonia. 11 mm nodule is noted in lingular segment of left upper lobe. 8 mm right middle lobe nodule is noted. Upper Abdomen: Multiple rounded low densities are noted in the patent parenchyma consistent with metastatic disease. Musculoskeletal: No chest wall mass or suspicious bone lesions identified. IMPRESSION: Large irregular left upper lobe density is noted most consistent with neoplasm  or malignancy and some degree of associated postobstructive atelectasis or pneumonia. Extensive mediastinal adenopathy is noted consistent with metastatic disease. Multiple hepatic metastases are noted. Pulmonary nodules are noted in the lingular segment of left upper lobe as well as in the right middle lobe concerning for metastatic disease. Small left pleural effusion is noted with adjacent subsegmental atelectasis. Moderate coronary artery calcifications are noted. Aortic Atherosclerosis (ICD10-I70.0). Electronically Signed   By: Marijo Conception M.D.   On: 02/06/2022 15:00   DG Chest 2 View  Result Date: 02/06/2022 CLINICAL DATA:  Shortness of breath EXAM: CHEST - 2 VIEW COMPARISON:  11/29/2015 FINDINGS: Left-sided implanted cardiac device. Cardiomegaly with prior sternotomy and aortic valve replacement. Aortic atherosclerosis. Small left pleural effusion with streaky bibasilar and left upper lobe airspace opacities. No pneumothorax. IMPRESSION: Small left pleural effusion with streaky bibasilar  and left upper lobe airspace opacities. Findings may reflect pulmonary edema versus multifocal infection. Electronically Signed   By: Davina Poke D.O.   On: 02/06/2022 14:04      Labs: BNP (last 3 results) Recent Labs    02/06/22 1410  BNP 856.3*   Basic Metabolic Panel: Recent Labs  Lab 02/06/22 1410 02/06/22 1715 02/08/22 0536 02/09/22 0524  NA 124* 127* 129* 129*  K 4.8  --  5.0 4.7  CL 97*  --  99 98  CO2 22  --  23 26  GLUCOSE 135*  --  177* 118*  BUN 35*  --  23 26*  CREATININE 1.78*  --  1.39* 1.39*  CALCIUM 9.1  --  9.5 9.6  MG  --   --  1.9 2.1   Liver Function Tests: Recent Labs  Lab 02/06/22 1715  AST 48*  ALT 20  ALKPHOS 64  BILITOT 0.2*  PROT 5.6*  ALBUMIN 3.1*   No results for input(s): "LIPASE", "AMYLASE" in the last 168 hours. No results for input(s): "AMMONIA" in the last 168 hours. CBC: Recent Labs  Lab 02/06/22 1410 02/08/22 0536 02/09/22 0524  WBC  9.5 7.9 12.5*  NEUTROABS 7.8*  --   --   HGB 13.4 13.0 12.6*  HCT 39.5 38.1* 37.6*  MCV 90.0 88.6 88.3  PLT 189 181 196   Cardiac Enzymes: No results for input(s): "CKTOTAL", "CKMB", "CKMBINDEX", "TROPONINI" in the last 168 hours. BNP: Invalid input(s): "POCBNP" CBG: No results for input(s): "GLUCAP" in the last 168 hours. D-Dimer No results for input(s): "DDIMER" in the last 72 hours. Hgb A1c No results for input(s): "HGBA1C" in the last 72 hours. Lipid Profile No results for input(s): "CHOL", "HDL", "LDLCALC", "TRIG", "CHOLHDL", "LDLDIRECT" in the last 72 hours. Thyroid function studies No results for input(s): "TSH", "T4TOTAL", "T3FREE", "THYROIDAB" in the last 72 hours.  Invalid input(s): "FREET3" Anemia work up No results for input(s): "VITAMINB12", "FOLATE", "FERRITIN", "TIBC", "IRON", "RETICCTPCT" in the last 72 hours. Urinalysis    Component Value Date/Time   COLORURINE YELLOW 02/06/2013 [place] 02/06/2013 1139   LABSPEC 1.012 02/06/2013 1139   PHURINE 6.5 02/06/2013 1139   GLUCOSEU NEGATIVE 02/06/2013 1139   HGBUR NEGATIVE 02/06/2013 1139   BILIRUBINUR NEGATIVE 02/06/2013 1139   KETONESUR NEGATIVE 02/06/2013 1139   PROTEINUR NEGATIVE 02/06/2013 1139   UROBILINOGEN 0.2 02/06/2013 1139   NITRITE NEGATIVE 02/06/2013 1139   LEUKOCYTESUR NEGATIVE 02/06/2013 1139   Sepsis Labs Recent Labs  Lab 02/06/22 1410 02/08/22 0536 02/09/22 0524  WBC 9.5 7.9 12.5*   Microbiology Recent Results (from the past 240 hour(s))  SARS Coronavirus 2 by RT PCR (hospital order, performed in [place] hospital lab) *cepheid single result test* Anterior Nasal Swab     Status: None   Collection Time: 02/06/22  2:22 PM   Specimen: Anterior Nasal Swab  Result Value Ref Range Status   SARS Coronavirus 2 by RT PCR NEGATIVE NEGATIVE Final    Comment: (NOTE) SARS-CoV-2 target nucleic acids are NOT DETECTED.  The SARS-CoV-2 RNA is generally detectable in upper and  lower respiratory specimens during the acute phase of infection. The lowest concentration of SARS-CoV-2 viral copies this assay can detect is 250 copies / mL. A negative result does not preclude SARS-CoV-2 infection and should not be used as the sole basis for treatment or other patient management decisions.  A negative result may occur with improper specimen collection / handling, submission of specimen other  than nasopharyngeal swab, presence of viral mutation(s) within the areas targeted by this assay, and inadequate number of viral copies (<250 copies / mL). A negative result must be combined with clinical observations, patient history, and epidemiological information.  Fact Sheet for Patients:   https://www.patel.info/  Fact Sheet for Healthcare Providers: https://hall.com/  This test is not yet approved or  cleared by the [place] FDA and has been authorized for detection and/or diagnosis of SARS-CoV-2 by FDA under an Emergency Use Authorization (EUA).  This EUA will remain in effect (meaning this test can be used) for the duration of the COVID-19 declaration under Section 564(b)(1) of the Act, 21 U.S.C. section 360bbb-3(b)(1), unless the authorization is terminated or revoked sooner.  Performed at [facility], [address] [place], [place] [address]      Total time spend on discharging this patient, including the last patient exam, discussing the hospital stay, instructions for ongoing care as it relates to all pertinent caregivers, as well as preparing the medical discharge records, prescriptions, and/or referrals as applicable, is 35 minutes.    Enzo Bi, MD  [facility] 02/09/2022, 8:15 AM

## 2022-02-13 ENCOUNTER — Telehealth: Payer: Self-pay | Admitting: *Deleted

## 2022-02-13 LAB — CYTOLOGY - NON PAP

## 2022-02-13 NOTE — Telephone Encounter (Signed)
Rise Paganini called stating that patient has decided NOT to have the biopsy , nor does he want chemotherapy. She is asking D rYu to please call her to discuss next steps for patient.

## 2022-02-14 ENCOUNTER — Encounter: Payer: Self-pay | Admitting: *Deleted

## 2022-02-14 NOTE — Progress Notes (Signed)
Message received from [facility] stating that pt is requesting referral to hospice and needs to cancel further work up at this time. Appts have been cancelled. Hospice made aware of referral and pt's PCP will be attending provider. Informed that will be called to set up initial visit with hospice. Instructed Beverly to call back with any further questions or needs. Understanding verbalized.

## 2022-02-14 NOTE — Telephone Encounter (Signed)
Spoke with [place] and answered all of her questions. Appts have been cancelled and hospice will be contacted to set up initial visit. Nothing further needed at this time.

## 2022-02-15 ENCOUNTER — Ambulatory Visit: Payer: Medicare Other

## 2022-02-20 ENCOUNTER — Other Ambulatory Visit: Payer: Self-pay | Admitting: Cardiology

## 2022-02-20 DIAGNOSIS — E785 Hyperlipidemia, unspecified: Secondary | ICD-10-CM

## 2022-02-23 ENCOUNTER — Ambulatory Visit: Payer: Medicare Other | Admitting: Oncology

## 2022-02-27 ENCOUNTER — Telehealth: Payer: Self-pay

## 2022-02-27 NOTE — Telephone Encounter (Signed)
The patient wife called letting us know that he passed away. I cancelled all upcoming remotes and took him out of [employer]. I called the patient wife to let her know she can call [facility] for a return kit but she did not answer.

## 2022-03-11 DEATH — deceased

## 2022-05-21 ENCOUNTER — Ambulatory Visit: Payer: Medicare Other | Admitting: Cardiology
